# Patient Record
Sex: Male | Born: 1940 | Race: White | Hispanic: No | Marital: Married | Smoking: Former smoker
Health system: Southern US, Community
[De-identification: ages and names within clinical notes are randomized; demographics above are authoritative.]

## PROBLEM LIST (undated history)

## (undated) DIAGNOSIS — R351 Nocturia: Secondary | ICD-10-CM

## (undated) DIAGNOSIS — Z951 Presence of aortocoronary bypass graft: Secondary | ICD-10-CM

## (undated) DIAGNOSIS — H269 Unspecified cataract: Secondary | ICD-10-CM

## (undated) DIAGNOSIS — J189 Pneumonia, unspecified organism: Secondary | ICD-10-CM

## (undated) DIAGNOSIS — N400 Enlarged prostate without lower urinary tract symptoms: Secondary | ICD-10-CM

## (undated) DIAGNOSIS — N529 Male erectile dysfunction, unspecified: Secondary | ICD-10-CM

## (undated) DIAGNOSIS — I679 Cerebrovascular disease, unspecified: Secondary | ICD-10-CM

## (undated) DIAGNOSIS — A4151 Sepsis due to Escherichia coli [E. coli]: Secondary | ICD-10-CM

## (undated) DIAGNOSIS — E291 Testicular hypofunction: Secondary | ICD-10-CM

## (undated) DIAGNOSIS — Z79899 Other long term (current) drug therapy: Secondary | ICD-10-CM

## (undated) DIAGNOSIS — I2510 Atherosclerotic heart disease of native coronary artery without angina pectoris: Secondary | ICD-10-CM

## (undated) DIAGNOSIS — M1990 Unspecified osteoarthritis, unspecified site: Secondary | ICD-10-CM

## (undated) DIAGNOSIS — K8020 Calculus of gallbladder without cholecystitis without obstruction: Secondary | ICD-10-CM

## (undated) DIAGNOSIS — E785 Hyperlipidemia, unspecified: Secondary | ICD-10-CM

## (undated) DIAGNOSIS — K635 Polyp of colon: Secondary | ICD-10-CM

## (undated) DIAGNOSIS — Z8489 Family history of other specified conditions: Secondary | ICD-10-CM

## (undated) DIAGNOSIS — I10 Essential (primary) hypertension: Secondary | ICD-10-CM

## (undated) DIAGNOSIS — IMO0001 Reserved for inherently not codable concepts without codable children: Secondary | ICD-10-CM

## (undated) DIAGNOSIS — N390 Urinary tract infection, site not specified: Secondary | ICD-10-CM

## (undated) DIAGNOSIS — H9193 Unspecified hearing loss, bilateral: Secondary | ICD-10-CM

## (undated) DIAGNOSIS — I639 Cerebral infarction, unspecified: Secondary | ICD-10-CM

## (undated) DIAGNOSIS — C4491 Basal cell carcinoma of skin, unspecified: Secondary | ICD-10-CM

## (undated) DIAGNOSIS — K759 Inflammatory liver disease, unspecified: Secondary | ICD-10-CM

## (undated) DIAGNOSIS — I4891 Unspecified atrial fibrillation: Secondary | ICD-10-CM

## (undated) DIAGNOSIS — N419 Inflammatory disease of prostate, unspecified: Secondary | ICD-10-CM

## (undated) DIAGNOSIS — K5790 Diverticulosis of intestine, part unspecified, without perforation or abscess without bleeding: Secondary | ICD-10-CM

## (undated) DIAGNOSIS — K219 Gastro-esophageal reflux disease without esophagitis: Secondary | ICD-10-CM

## (undated) DIAGNOSIS — G4730 Sleep apnea, unspecified: Secondary | ICD-10-CM

## (undated) DIAGNOSIS — T7840XA Allergy, unspecified, initial encounter: Secondary | ICD-10-CM

## (undated) DIAGNOSIS — Z5181 Encounter for therapeutic drug level monitoring: Secondary | ICD-10-CM

## (undated) DIAGNOSIS — I219 Acute myocardial infarction, unspecified: Secondary | ICD-10-CM

## (undated) DIAGNOSIS — I739 Peripheral vascular disease, unspecified: Secondary | ICD-10-CM

## (undated) DIAGNOSIS — D126 Benign neoplasm of colon, unspecified: Secondary | ICD-10-CM

## (undated) DIAGNOSIS — E669 Obesity, unspecified: Secondary | ICD-10-CM

## (undated) DIAGNOSIS — C4492 Squamous cell carcinoma of skin, unspecified: Secondary | ICD-10-CM

## (undated) HISTORY — DX: Essential (primary) hypertension: I10

## (undated) HISTORY — DX: Male erectile dysfunction, unspecified: N52.9

## (undated) HISTORY — DX: Cerebral infarction, unspecified: I63.9

## (undated) HISTORY — DX: Acute myocardial infarction, unspecified: I21.9

## (undated) HISTORY — DX: Unspecified osteoarthritis, unspecified site: M19.90

## (undated) HISTORY — PX: TOTAL KNEE ARTHROPLASTY: SHX125

## (undated) HISTORY — DX: Atherosclerotic heart disease of native coronary artery without angina pectoris: I25.10

## (undated) HISTORY — DX: Calculus of gallbladder without cholecystitis without obstruction: K80.20

## (undated) HISTORY — DX: Allergy, unspecified, initial encounter: T78.40XA

## (undated) HISTORY — PX: CORONARY ANGIOPLASTY WITH STENT PLACEMENT: SHX49

## (undated) HISTORY — PX: TONSILLECTOMY: SUR1361

## (undated) HISTORY — PX: JOINT REPLACEMENT: SHX530

## (undated) HISTORY — DX: Hyperlipidemia, unspecified: E78.5

## (undated) HISTORY — PX: VASECTOMY: SHX75

## (undated) HISTORY — DX: Diverticulosis of intestine, part unspecified, without perforation or abscess without bleeding: K57.90

## (undated) HISTORY — DX: Polyp of colon: K63.5

## (undated) HISTORY — DX: Urinary tract infection, site not specified: N39.0

## (undated) HISTORY — DX: Testicular hypofunction: E29.1

## (undated) HISTORY — DX: Sleep apnea, unspecified: G47.30

## (undated) HISTORY — DX: Obesity, unspecified: E66.9

## (undated) HISTORY — DX: Inflammatory liver disease, unspecified: K75.9

## (undated) HISTORY — DX: Benign prostatic hyperplasia without lower urinary tract symptoms: N40.0

## (undated) HISTORY — DX: Inflammatory disease of prostate, unspecified: N41.9

## (undated) HISTORY — DX: Benign neoplasm of colon, unspecified: D12.6

## (undated) HISTORY — DX: Cerebrovascular disease, unspecified: I67.9

## (undated) HISTORY — DX: Gastro-esophageal reflux disease without esophagitis: K21.9

## (undated) HISTORY — DX: Sepsis due to Escherichia coli (e. coli): A41.51

---
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
## 1998-11-23 ENCOUNTER — Other Ambulatory Visit: Admission: RE | Admit: 1998-11-23 | Discharge: 1998-11-23 | Payer: Self-pay | Admitting: Urology

## 2004-05-17 DIAGNOSIS — K635 Polyp of colon: Secondary | ICD-10-CM

## 2004-05-17 HISTORY — DX: Polyp of colon: K63.5

## 2004-06-01 ENCOUNTER — Encounter: Admission: RE | Admit: 2004-06-01 | Discharge: 2004-06-01 | Payer: Self-pay | Admitting: Otolaryngology

## 2004-06-05 ENCOUNTER — Ambulatory Visit (HOSPITAL_COMMUNITY): Admission: RE | Admit: 2004-06-05 | Discharge: 2004-06-05 | Payer: Self-pay | Admitting: Gastroenterology

## 2004-06-05 ENCOUNTER — Encounter (INDEPENDENT_AMBULATORY_CARE_PROVIDER_SITE_OTHER): Payer: Self-pay | Admitting: *Deleted

## 2004-11-30 ENCOUNTER — Ambulatory Visit: Payer: Self-pay | Admitting: Cardiology

## 2004-12-31 ENCOUNTER — Ambulatory Visit (HOSPITAL_COMMUNITY): Admission: RE | Admit: 2004-12-31 | Discharge: 2004-12-31 | Payer: Self-pay | Admitting: Orthopedic Surgery

## 2005-01-02 ENCOUNTER — Inpatient Hospital Stay (HOSPITAL_COMMUNITY): Admission: RE | Admit: 2005-01-02 | Discharge: 2005-01-05 | Payer: Self-pay | Admitting: Orthopedic Surgery

## 2005-11-13 ENCOUNTER — Ambulatory Visit: Payer: Self-pay | Admitting: Cardiovascular Disease

## 2005-12-06 ENCOUNTER — Ambulatory Visit: Payer: Self-pay | Admitting: Cardiology

## 2006-01-01 ENCOUNTER — Inpatient Hospital Stay (HOSPITAL_COMMUNITY): Admission: RE | Admit: 2006-01-01 | Discharge: 2006-01-04 | Payer: Self-pay | Admitting: Orthopedic Surgery

## 2006-01-31 ENCOUNTER — Ambulatory Visit: Payer: Self-pay

## 2006-03-12 ENCOUNTER — Ambulatory Visit (HOSPITAL_COMMUNITY): Admission: RE | Admit: 2006-03-12 | Discharge: 2006-03-12 | Payer: Self-pay | Admitting: Orthopedic Surgery

## 2007-01-06 ENCOUNTER — Ambulatory Visit: Payer: Self-pay | Admitting: Cardiology

## 2007-01-16 ENCOUNTER — Ambulatory Visit: Payer: Self-pay

## 2008-01-13 ENCOUNTER — Ambulatory Visit: Payer: Self-pay

## 2008-01-13 ENCOUNTER — Ambulatory Visit: Payer: Self-pay | Admitting: Cardiology

## 2008-01-13 LAB — CONVERTED CEMR LAB
ALT: 35 units/L (ref 0–53)
Albumin: 3.6 g/dL (ref 3.5–5.2)
Alkaline Phosphatase: 28 units/L — ABNORMAL LOW (ref 39–117)
BUN: 10 mg/dL (ref 6–23)
Bilirubin, Direct: 0.1 mg/dL (ref 0.0–0.3)
CO2: 29 meq/L (ref 19–32)
Calcium: 9.4 mg/dL (ref 8.4–10.5)
Cholesterol: 110 mg/dL (ref 0–200)
Glucose, Bld: 102 mg/dL — ABNORMAL HIGH (ref 70–99)
HDL: 38.2 mg/dL — ABNORMAL LOW (ref >39.0)
Sodium: 140 meq/L (ref 135–145)
Total Protein: 6.3 g/dL (ref 6.0–8.3)
Triglycerides: 89 mg/dL (ref 0–149)

## 2008-05-19 ENCOUNTER — Ambulatory Visit: Payer: Self-pay | Admitting: Cardiology

## 2008-05-19 ENCOUNTER — Inpatient Hospital Stay (HOSPITAL_COMMUNITY): Admission: AD | Admit: 2008-05-19 | Discharge: 2008-05-21 | Payer: Self-pay | Admitting: Cardiology

## 2008-05-19 ENCOUNTER — Encounter: Payer: Self-pay | Admitting: Emergency Medicine

## 2008-05-20 ENCOUNTER — Encounter (INDEPENDENT_AMBULATORY_CARE_PROVIDER_SITE_OTHER): Payer: Self-pay | Admitting: Nephrology

## 2008-05-30 ENCOUNTER — Ambulatory Visit: Payer: Self-pay | Admitting: Cardiology

## 2008-06-02 ENCOUNTER — Encounter (HOSPITAL_COMMUNITY): Admission: RE | Admit: 2008-06-02 | Discharge: 2008-08-31 | Payer: Self-pay | Admitting: Cardiology

## 2008-06-13 ENCOUNTER — Ambulatory Visit: Payer: Self-pay | Admitting: Cardiovascular Disease

## 2008-06-13 ENCOUNTER — Ambulatory Visit: Payer: Self-pay

## 2008-07-07 ENCOUNTER — Ambulatory Visit: Payer: Self-pay | Admitting: Cardiovascular Disease

## 2008-08-30 ENCOUNTER — Ambulatory Visit: Payer: Self-pay | Admitting: Cardiology

## 2008-08-30 LAB — CONVERTED CEMR LAB
Albumin: 3.7 g/dL (ref 3.5–5.2)
BUN: 13 mg/dL (ref 6–23)
Calcium: 9.2 mg/dL (ref 8.4–10.5)
Creatinine, Ser: 1.1 mg/dL (ref 0.4–1.5)
Eosinophils Relative: 3.2 % (ref 0.0–5.0)
GFR calc Af Amer: 86 mL/min
Glucose, Bld: 97 mg/dL (ref 70–99)
HCT: 45 % (ref 39.0–52.0)
Hemoglobin: 15.6 g/dL (ref 13.0–17.0)
Monocytes Absolute: 0.7 10*3/uL (ref 0.1–1.0)
Monocytes Relative: 10.7 % (ref 3.0–12.0)
Neutro Abs: 3.9 10*3/uL (ref 1.4–7.7)
Total CHOL/HDL Ratio: 2.5
Total Protein: 6.2 g/dL (ref 6.0–8.3)
Triglycerides: 93 mg/dL (ref 0–149)
WBC: 6.2 10*3/uL (ref 4.5–10.5)

## 2008-09-01 ENCOUNTER — Encounter (HOSPITAL_COMMUNITY): Admission: RE | Admit: 2008-09-01 | Discharge: 2008-09-14 | Payer: Self-pay | Admitting: Cardiology

## 2008-09-20 ENCOUNTER — Ambulatory Visit: Payer: Self-pay | Admitting: Cardiology

## 2008-10-06 ENCOUNTER — Ambulatory Visit: Payer: Self-pay | Admitting: Cardiovascular Disease

## 2008-10-22 ENCOUNTER — Encounter: Payer: Self-pay | Admitting: Emergency Medicine

## 2008-10-23 ENCOUNTER — Ambulatory Visit: Payer: Self-pay | Admitting: Internal Medicine

## 2008-10-23 ENCOUNTER — Inpatient Hospital Stay (HOSPITAL_COMMUNITY): Admission: AD | Admit: 2008-10-23 | Discharge: 2008-10-26 | Payer: Self-pay | Admitting: Internal Medicine

## 2008-10-25 ENCOUNTER — Encounter (INDEPENDENT_AMBULATORY_CARE_PROVIDER_SITE_OTHER): Payer: Self-pay | Admitting: Internal Medicine

## 2008-10-25 ENCOUNTER — Ambulatory Visit: Payer: Self-pay | Admitting: Vascular Surgery

## 2009-01-12 DIAGNOSIS — K759 Inflammatory liver disease, unspecified: Secondary | ICD-10-CM | POA: Insufficient documentation

## 2009-01-12 DIAGNOSIS — Z6841 Body Mass Index (BMI) 40.0 and over, adult: Secondary | ICD-10-CM

## 2009-01-12 DIAGNOSIS — G4733 Obstructive sleep apnea (adult) (pediatric): Secondary | ICD-10-CM | POA: Insufficient documentation

## 2009-01-12 DIAGNOSIS — I498 Other specified cardiac arrhythmias: Secondary | ICD-10-CM | POA: Insufficient documentation

## 2009-01-12 DIAGNOSIS — K8020 Calculus of gallbladder without cholecystitis without obstruction: Secondary | ICD-10-CM | POA: Insufficient documentation

## 2009-01-12 DIAGNOSIS — I2510 Atherosclerotic heart disease of native coronary artery without angina pectoris: Secondary | ICD-10-CM | POA: Insufficient documentation

## 2009-01-12 DIAGNOSIS — E785 Hyperlipidemia, unspecified: Secondary | ICD-10-CM | POA: Insufficient documentation

## 2009-01-12 DIAGNOSIS — K5730 Diverticulosis of large intestine without perforation or abscess without bleeding: Secondary | ICD-10-CM | POA: Insufficient documentation

## 2009-01-12 DIAGNOSIS — I10 Essential (primary) hypertension: Secondary | ICD-10-CM | POA: Insufficient documentation

## 2009-01-12 DIAGNOSIS — M129 Arthropathy, unspecified: Secondary | ICD-10-CM | POA: Insufficient documentation

## 2009-01-12 DIAGNOSIS — Z87448 Personal history of other diseases of urinary system: Secondary | ICD-10-CM | POA: Insufficient documentation

## 2009-01-12 DIAGNOSIS — Z87898 Personal history of other specified conditions: Secondary | ICD-10-CM | POA: Insufficient documentation

## 2009-01-12 DIAGNOSIS — I219 Acute myocardial infarction, unspecified: Secondary | ICD-10-CM | POA: Insufficient documentation

## 2009-01-13 ENCOUNTER — Ambulatory Visit: Payer: Self-pay

## 2009-01-13 ENCOUNTER — Ambulatory Visit: Payer: Self-pay | Admitting: Cardiology

## 2009-01-13 DIAGNOSIS — I6529 Occlusion and stenosis of unspecified carotid artery: Secondary | ICD-10-CM | POA: Insufficient documentation

## 2009-01-27 ENCOUNTER — Encounter: Payer: Self-pay | Admitting: Cardiology

## 2009-01-27 ENCOUNTER — Ambulatory Visit: Payer: Self-pay | Admitting: Cardiovascular Disease

## 2009-04-24 ENCOUNTER — Ambulatory Visit: Payer: Self-pay | Admitting: Cardiology

## 2009-04-24 DIAGNOSIS — E7800 Pure hypercholesterolemia, unspecified: Secondary | ICD-10-CM | POA: Insufficient documentation

## 2009-04-25 LAB — CONVERTED CEMR LAB
ALT: 39 units/L (ref 0–53)
Albumin: 3.6 g/dL (ref 3.5–5.2)
HDL: 42.2 mg/dL (ref >39.00)
Total Bilirubin: 0.8 mg/dL (ref 0.3–1.2)
Total Protein: 6.5 g/dL (ref 6.0–8.3)
VLDL: 29.2 mg/dL (ref 0.0–40.0)

## 2009-05-11 ENCOUNTER — Encounter (INDEPENDENT_AMBULATORY_CARE_PROVIDER_SITE_OTHER): Payer: Self-pay | Admitting: *Deleted

## 2009-05-30 ENCOUNTER — Ambulatory Visit: Payer: Self-pay | Admitting: Cardiovascular Disease

## 2009-05-31 ENCOUNTER — Encounter: Payer: Self-pay | Admitting: Cardiology

## 2009-06-27 ENCOUNTER — Ambulatory Visit: Payer: Self-pay | Admitting: Cardiology

## 2009-06-28 LAB — CONVERTED CEMR LAB
ALT: 45 units/L (ref 0–53)
Bilirubin, Direct: 0 mg/dL (ref 0.0–0.3)
Total Bilirubin: 1 mg/dL (ref 0.3–1.2)
Total CK: 157 units/L (ref 7–232)

## 2009-07-05 ENCOUNTER — Encounter: Payer: Self-pay | Admitting: Cardiology

## 2009-07-27 ENCOUNTER — Telehealth: Payer: Self-pay | Admitting: Cardiology

## 2009-07-27 ENCOUNTER — Encounter (INDEPENDENT_AMBULATORY_CARE_PROVIDER_SITE_OTHER): Payer: Self-pay | Admitting: *Deleted

## 2009-07-28 ENCOUNTER — Encounter (INDEPENDENT_AMBULATORY_CARE_PROVIDER_SITE_OTHER): Payer: Self-pay | Admitting: *Deleted

## 2009-09-18 ENCOUNTER — Telehealth: Payer: Self-pay | Admitting: Cardiology

## 2009-11-28 ENCOUNTER — Encounter: Payer: Self-pay | Admitting: Cardiology

## 2010-01-12 ENCOUNTER — Encounter: Payer: Self-pay | Admitting: Cardiology

## 2010-01-15 ENCOUNTER — Encounter: Payer: Self-pay | Admitting: Cardiology

## 2010-01-15 ENCOUNTER — Ambulatory Visit: Payer: Self-pay

## 2010-01-19 ENCOUNTER — Ambulatory Visit: Payer: Self-pay | Admitting: Cardiology

## 2010-01-19 DIAGNOSIS — R059 Cough, unspecified: Secondary | ICD-10-CM | POA: Insufficient documentation

## 2010-01-19 DIAGNOSIS — R55 Syncope and collapse: Secondary | ICD-10-CM | POA: Insufficient documentation

## 2010-01-19 DIAGNOSIS — R05 Cough: Secondary | ICD-10-CM

## 2010-01-23 ENCOUNTER — Encounter (INDEPENDENT_AMBULATORY_CARE_PROVIDER_SITE_OTHER): Payer: Self-pay | Admitting: *Deleted

## 2010-02-09 ENCOUNTER — Ambulatory Visit: Payer: Self-pay

## 2010-02-09 ENCOUNTER — Encounter: Payer: Self-pay | Admitting: Cardiology

## 2010-02-09 ENCOUNTER — Ambulatory Visit (HOSPITAL_COMMUNITY): Admission: RE | Admit: 2010-02-09 | Discharge: 2010-02-09 | Payer: Self-pay | Admitting: Cardiology

## 2010-02-09 ENCOUNTER — Ambulatory Visit: Payer: Self-pay | Admitting: Cardiovascular Disease

## 2010-03-13 ENCOUNTER — Ambulatory Visit: Payer: Self-pay | Admitting: Cardiovascular Disease

## 2010-03-21 LAB — CONVERTED CEMR LAB
ALT: 34 units/L (ref 0–53)
Alkaline Phosphatase: 36 units/L — ABNORMAL LOW (ref 39–117)
Bilirubin, Direct: 0.1 mg/dL (ref 0.0–0.3)
Total Bilirubin: 0.3 mg/dL (ref 0.3–1.2)
Total Protein: 6.5 g/dL (ref 6.0–8.3)

## 2010-04-20 ENCOUNTER — Ambulatory Visit: Payer: Self-pay | Admitting: Cardiology

## 2010-05-08 ENCOUNTER — Encounter: Payer: Self-pay | Admitting: Cardiovascular Disease

## 2010-05-09 ENCOUNTER — Ambulatory Visit: Payer: Self-pay

## 2010-05-09 ENCOUNTER — Ambulatory Visit: Payer: Self-pay | Admitting: Cardiovascular Disease

## 2010-05-16 ENCOUNTER — Ambulatory Visit: Payer: Self-pay | Admitting: Cardiovascular Disease

## 2010-05-17 ENCOUNTER — Encounter: Payer: Self-pay | Admitting: Cardiology

## 2010-05-17 HISTORY — PX: KNEE SURGERY: SHX244

## 2010-06-16 HISTORY — PX: TRANSURETHRAL RESECTION OF PROSTATE: SHX73

## 2010-06-26 ENCOUNTER — Telehealth: Payer: Self-pay | Admitting: Cardiology

## 2010-06-28 ENCOUNTER — Ambulatory Visit (HOSPITAL_COMMUNITY): Admission: RE | Admit: 2010-06-28 | Discharge: 2010-06-29 | Payer: Self-pay | Admitting: Orthopedic Surgery

## 2010-07-23 ENCOUNTER — Ambulatory Visit (HOSPITAL_BASED_OUTPATIENT_CLINIC_OR_DEPARTMENT_OTHER): Admission: RE | Admit: 2010-07-23 | Discharge: 2010-07-24 | Payer: Self-pay | Admitting: Urology

## 2010-07-24 ENCOUNTER — Emergency Department (HOSPITAL_COMMUNITY): Admission: EM | Admit: 2010-07-24 | Discharge: 2010-07-25 | Payer: Self-pay | Admitting: Emergency Medicine

## 2010-10-14 LAB — CONVERTED CEMR LAB
Alkaline Phosphatase: 42 units/L (ref 39–117)
Basophils Absolute: 0 10*3/uL (ref 0.0–0.1)
Basophils Relative: 0.4 % (ref 0.0–3.0)
Basophils Relative: 0.4 % (ref 0.0–3.0)
Bilirubin, Direct: 0.1 mg/dL (ref 0.0–0.3)
CO2: 33 meq/L — ABNORMAL HIGH (ref 19–32)
Chloride: 99 meq/L (ref 96–112)
Eosinophils Relative: 3.2 % (ref 0.0–5.0)
HCT: 41.6 % (ref 39.0–52.0)
Hemoglobin: 13.9 g/dL (ref 13.0–17.0)
Hemoglobin: 14.6 g/dL (ref 13.0–17.0)
LDL Cholesterol: 52 mg/dL (ref 0–99)
Lymphocytes Relative: 20.6 % (ref 12.0–46.0)
Lymphs Abs: 1.2 10*3/uL (ref 0.7–4.0)
MCV: 92.4 fL (ref 78.0–100.0)
Monocytes Absolute: 0.5 10*3/uL (ref 0.1–1.0)
Monocytes Relative: 7.1 % (ref 3.0–12.0)
Monocytes Relative: 9.8 % (ref 3.0–12.0)
Neutro Abs: 3.8 10*3/uL (ref 1.4–7.7)
Potassium: 4.3 meq/L (ref 3.5–5.1)
RBC: 4.5 M/uL (ref 4.22–5.81)
RBC: 4.54 M/uL (ref 4.22–5.81)
RDW: 13.5 % (ref 11.5–14.6)
Sodium: 139 meq/L (ref 135–145)
Total CHOL/HDL Ratio: 3
Total Protein: 6.5 g/dL (ref 6.0–8.3)
VLDL: 38.6 mg/dL (ref 0.0–40.0)
WBC: 7.5 10*3/uL (ref 4.5–10.5)

## 2010-10-18 NOTE — Miscellaneous (Signed)
Summary: Orders Update  Clinical Lists Changes  Orders: Added new Test order of Carotid Duplex (Carotid Duplex) - Signed 

## 2010-10-18 NOTE — Progress Notes (Signed)
Summary: pt needs to stop blood thinners for surgery  Phone Note From Other Clinic Call back at [phone] ext [phone]   Caller: Nurse Pam with Dr. Patsi Sears Request: Talk with Nurse, Talk with Harvin Konicek Summary of Call: pt needs to stop ASA and effient prior to TURP surgery on 11/7  fax# [phone] Initial call taken by: Omer Jack,  June 26, 2010 1:27 PM  Follow-up for Phone Call        will foward for dr Jens Som review Deliah Goody, RN  June 26, 2010 4:20 PM   Additional Follow-up for Phone Call Additional follow up Details #1::        ok to dc effient 7-10 days prior to surgery; continue ASA if possible (h/o drug eluting stent). Ferman Hamming, MD, [facility]  June 26, 2010 5:47 PM  PAM AWARE ABOVE RESPONSE FAXED TO LISTED NUMBER. Additional Follow-up by: Scherrie Bateman, LPN,  June 27, 2010 11:06 AM

## 2010-10-18 NOTE — Miscellaneous (Signed)
Summary: Orders Update  Clinical Lists Changes  Orders: Added new Test order of Arterial Duplex Lower Extremity (Arterial Duplex Low) - Signed 

## 2010-10-18 NOTE — Letter (Signed)
Summary: Custom - Lipid  [facility], Main Office  [address]   [place], [place] [address]   Phone: [phone]  Fax: [phone]     Jan 23, 2010 MRN: [id number]   Jeffrey Wiggins [address] [place], [place]  [address]   Dear [name],  We have reviewed your cholesterol results.  They are as follows:     Total Cholesterol:    132 (Desirable: less than 200)       HDL  Cholesterol:     41.30  (Desirable: greater than 40 for men and 50 for women)       LDL Cholesterol:       52  (Desirable: less than 100 for low risk and less than 70 for moderate to high risk)       Triglycerides:       193.0  (Desirable: less than 150)  Our recommendations include:These numbers look good. Continue on the same medicine. Sodium, potassium, blood count,kidney and Liver function are normal. Take care, Dr. Darel Hong.    Call our office at the number listed above if you have any questions.  Lowering your LDL cholesterol is important, but it is only one of a large number of "risk factors" that may indicate that you are at risk for heart disease, stroke or other complications of hardening of the arteries.  Other risk factors include:   A.  Cigarette Smoking* B.  High Blood Pressure* C.  Obesity* D.   Low HDL Cholesterol (see yours above)* E.   Diabetes Mellitus (higher risk if your is uncontrolled) F.  Family history of premature heart disease G.  Previous history of stroke or cardiovascular disease    *These are risk factors YOU HAVE CONTROL OVER.  For more information, visit .  There is now evidence that lowering the TOTAL CHOLESTEROL AND LDL CHOLESTEROL can reduce the risk of heart disease.  The American Heart Association recommends the following guidelines for the treatment of elevated cholesterol:  1.  If there is now current heart disease and less than two risk factors, TOTAL CHOLESTEROL should be less than 200 and LDL CHOLESTEROL should be less than 100. 2.  If  there is current heart disease or two or more risk factors, TOTAL CHOLESTEROL should be less than 200 and LDL CHOLESTEROL should be less than 70.  A diet low in cholesterol, saturated fat, and calories is the cornerstone of treatment for elevated cholesterol.  Cessation of smoking and exercise are also important in the management of elevated cholesterol and preventing vascular disease.  Studies have shown that 30 to 60 minutes of physical activity most days can help lower blood pressure, lower cholesterol, and keep your weight at a healthy level.  Drug therapy is used when cholesterol levels do not respond to therapeutic lifestyle changes (smoking cessation, diet, and exercise) and remains unacceptably high.  If medication is started, it is important to have you levels checked periodically to evaluate the need for further treatment options.  Thank you,    [employer] Team

## 2010-10-18 NOTE — Progress Notes (Signed)
Summary: refill  Phone Note Refill Request   Refills Requested: Medication #1:  HYDROCHLOROTHIAZIDE 12.5 MG TABS Take 1 tablet by mouth once a day   Supply Requested: 3 months [facility]   Method Requested: Fax to [facility] Initial call taken by: Migdalia Dk,  September 18, 2009 11:00 AM    Prescriptions: HYDROCHLOROTHIAZIDE 12.5 MG TABS (HYDROCHLOROTHIAZIDE) Take 1 tablet by mouth once a day  #90 x 3   Entered by:   Kem Parkinson   Authorized by:   Ferman Hamming, MD, [facility]   Signed by:   Kem Parkinson on 09/18/2009   Method used:   Electronically to        [facility] (mail-order)             ,          Ph: [phone]       Fax: [phone]   RxID:   [id number]

## 2010-10-18 NOTE — Assessment & Plan Note (Signed)
Summary: per check out/sf   CC:  check up.  History of Present Illness: Jeffrey Wiggins is a very pleasant gentleman, has a history of coronary artery disease.  In September 2009, the patient presented with an inferior infarct.  At that time, he was found to have total occlusion of his proximal right coronary due to stent thrombosis.  His ejection fraction was 60%.  He had a drug-eluting stent placed at right coronary artery at that time. He had a 50% circumflex as well.  Echocardiogram in May of 2011 showed normal LV function, mild LAE.  Abdominal ultrasound in 2007 showed no aneurysm. Last carotid Dopplers were performed in May of 2011 and showed a 40-59% right and a 0-39% left stenosis. Followup was recommended in 2 years. I last saw him in May of 2011. At that time he was felt to have had an episode of cough syncope. His ACE inhibitor was discontinued. Since then the patient denies any dyspnea on exertion, orthopnea, PND, pedal edema, palpitations, syncope or chest pain.   Current Medications (verified): 1)  Tra2p Study Drug .... Take One Tab Once Daily 2)  Hydrochlorothiazide 12.5 Mg Tabs (Hydrochlorothiazide) .... Take 1 Tablet By Mouth Once A Day 3)  Crestor 20 Mg Tabs (Rosuvastatin Calcium) .... Take One Tablet By Mouth Daily. 4)  Aspirin 81 Mg Tbec (Aspirin) .... Take One Tablet By Mouth Daily 5)  Uroxatral 10 Mg Xr24h-Tab (Alfuzosin Hcl) .Marland Kitchen.. 1 Tab By Mouth Once Daily 6)  Flonase 50 Mcg/act Susp (Fluticasone Propionate) .... As Directed 7)  Testim 1 % Ointment .... As Needed 8)  Multivitamins   Tabs (Multiple Vitamin) .Marland Kitchen.. 1 Tab By Mouth Once Daily 9)  Avodart 0.5 Mg Caps (Dutasteride) .Marland Kitchen.. 1 Tab By Mouth Once Daily 10)  Tylenol Arthritis Pain 650 Mg Cr-Tabs (Acetaminophen) .... As Needed 11)  Co Q 10 10 Mg Caps (Coenzyme Q10) .Marland Kitchen.. 1 Tab By Mouth Once Daily 12)  Effient 10 Mg Tabs (Prasugrel Hcl) .Marland Kitchen.. 1 Tab By Mouth Once Daily 13)  Fish Oil   Oil (Fish Oil) .Marland Kitchen.. 1 Tab By Mouth Once  Daily 14)  Acai 1000mg  .... 1 Tab By Mouth Once Daily 15)  Nitroglycerin 0.4 Mg Subl (Nitroglycerin) .... One Tablet Under Tongue Every 5 Minutes As Needed For Chest Pain---May Repeat Times Three 16)  Claritin 10 Mg Tabs (Loratadine) .Marland Kitchen.. 1 Tab By Mouth Once Daily 17)  Prosvent .Marland Kitchen.. 1 Tab By Mouth Two Times A Day 18)  Provigor .Marland Kitchen.. 1 Tab By Mouth Two Times A Day 19)  Vitamin D3 1000 Unit Caps (Cholecalciferol) .Marland Kitchen.. 1 Tab By Mouth Once Daily 20)  Magnesium Oxide .Marland Kitchen.. 1 Tab By Mouth Once Daily 21)  Cozaar 100 Mg Tabs (Losartan Potassium) .... Take One Tablet By Mouth Daily  Past History:  Past Medical History: Reviewed history from 01/19/2010 and no changes required. CAD (ICD-414.00) HYPERTENSION (ICD-401.9) PROSTATITIS, HX OF (ICD-V13.09) HYPERLIPIDEMIA (ICD-272.4) OBESITY (ICD-278.00) DIVERTICULAR DISEASE (ICD-562.10) GALLSTONES (ICD-574.20) HEPATITIS (ICD-573.3) ARTHRITIS (ICD-716.90) SLEEP APNEA (ICD-780.57) BENIGN PROSTATIC HYPERTROPHY, HX OF (ICD-V13.8) Cerebrovascular disease    Escherichia coli sepsis.   Secondary urinary tract infection.   Past Surgical History: Reviewed history from 01/12/2009 and no changes required. PTCA/stent of his right coronary artery in May of 1998.  Knee replacement.  Vasectomy.  Tonsillectomy.      Social History: Reviewed history from 01/12/2009 and no changes required.  Alcohol, 1 glass of beer per day.  Caffeine 4-5 glasses   per day.  His mother and  father both deceased (coronary artery disease).   Occupational, the patient is currently IT trainer.  Tobacco is 45-pack-year   history, but none x18 years.   Review of Systems       Problems with knee pain but no fevers or chills, productive cough, hemoptysis, dysphasia, odynophagia, melena, hematochezia, dysuria, hematuria, rash, seizure activity, orthopnea, PND, pedal edema, claudication. Remaining systems are negative.   Vital Signs:  Patient profile:   70 year old male Height:       68 inches Weight:      272 pounds BMI:     41.51 Pulse rate:   84 / minute Resp:     12 per minute BP sitting:   127 / 82  (left arm)  Vitals Entered By: Kem Parkinson (April 20, 2010 8:53 AM)  Physical Exam  General:  Well-developed well-nourished in no acute distress.  Skin is warm and dry.  HEENT is normal.  Neck is supple. No thyromegaly.  Chest is clear to auscultation with normal expansion.  Cardiovascular exam is regular rate and rhythm.  Abdominal exam nontender or distended. No masses palpated. Extremities show no edema. neuro grossly intact    Impression & Recommendations:  Problem # 1:  SYNCOPE (ICD-780.2) No further episodes. Most likely cough syncope. His updated medication list for this problem includes:    Aspirin 81 Mg Tbec (Aspirin) .Marland Kitchen... Take one tablet by mouth daily    Effient 10 Mg Tabs (Prasugrel hcl) .Marland Kitchen... 1 tab by mouth once daily    Nitroglycerin 0.4 Mg Subl (Nitroglycerin) ..... One tablet under tongue every 5 minutes as needed for chest pain---may repeat times three  Problem # 2:  PURE HYPERCHOLESTEROLEMIA (ICD-272.0) Continue statin. His updated medication list for this problem includes:    Crestor 20 Mg Tabs (Rosuvastatin calcium) .Marland Kitchen... Take one tablet by mouth daily.  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 Followup carotid Dopplers May 2013. His updated medication list for this problem includes:    Aspirin 81 Mg Tbec (Aspirin) .Marland Kitchen... Take one tablet by mouth daily    Effient 10 Mg Tabs (Prasugrel hcl) .Marland Kitchen... 1 tab by mouth once daily  Problem # 4:  CAD (ICD-414.00) Continue aspirin, statin. He is scheduled to have knee surgery and potentially prostate surgery. He is not having chest pain. I would like for him to continue aspirin if possible and discontinue his effient 10 days prior to the procedure. If absolutely necessary we will discontinue his aspirin 10 days prior to. Resume afterwards. I do not  think ischemia evaluation is necessary preoperatively as he has had no symptoms. His updated medication list for this problem includes:    Aspirin 81 Mg Tbec (Aspirin) .Marland Kitchen... Take one tablet by mouth daily    Effient 10 Mg Tabs (Prasugrel hcl) .Marland Kitchen... 1 tab by mouth once daily    Nitroglycerin 0.4 Mg Subl (Nitroglycerin) ..... One tablet under tongue every 5 minutes as needed for chest pain---may repeat times three  Problem # 5:  HYPERTENSION (ICD-401.9) Blood pressure controlled on present medications. Will continue. His updated medication list for this problem includes:    Hydrochlorothiazide 12.5 Mg Tabs (Hydrochlorothiazide) .Marland Kitchen... Take 1 tablet by mouth once a day    Aspirin 81 Mg Tbec (Aspirin) .Marland Kitchen... Take one tablet by mouth daily    Cozaar 100 Mg Tabs (Losartan potassium) .Marland Kitchen... Take one tablet by mouth daily  Problem # 6:  SLEEP APNEA (ICD-780.57)  Problem # 7:  BENIGN PROSTATIC  HYPERTROPHY, HX OF (ICD-V13.8)  Patient Instructions: 1)  Your physician recommends that you schedule a follow-up appointment in: 1 year with Dr. Jens Som 2)  Your physician recommends that you continue on your current medications as directed. Please refer to the Current Medication list given to you today.

## 2010-10-18 NOTE — Assessment & Plan Note (Signed)
Summary: ROV   CC:  no complaints.  History of Present Illness: Jeffrey Wiggins is a very pleasant gentleman, has a history of coronary artery disease.  In September 2009, the patient presented with an inferior infarct.  At that time, he was found to have total occlusion of his proximal right coronary due to stent thrombosis.  His ejection fraction was 60%.  He had a drug-eluting stent placed at right coronary artery at that time. He had a 50% circumflex as well.  Echocardiogram in 2009 showed normal LV function, mild biatrial enlargement and mild right ventricular enlargement.  Abdominal ultrasound in 2007 showed no aneurysm. Last carotid Dopplers were performed in May of 2011 and showed a 40-59% right and a 0-39% left stenosis. Followup was recommended in 2 years. I last saw him in April 2010. Since then he has noticed some increased dyspnea on exertion relieved with rest. There is no associated chest pain. There is no orthopnea, PND or pedal edema. He has not had exertional chest pain. He does complain of a cough that is dry for the past one year. Several weeks ago he had an episode of syncope that occurred while he was choking on some food. He began coughing and then had a syncopal episode. There was no associated palpitations, chest pain or shortness of breath.  Current Medications (verified): 1)  Tra2p Study Drug .... Take One Tab Once Daily 2)  Hydrochlorothiazide 12.5 Mg Tabs (Hydrochlorothiazide) .... Take 1 Tablet By Mouth Once A Day 3)  Quinapril Hcl 20 Mg Tabs (Quinapril Hcl) .... Take 1 By Mouth Twice A Day 4)  Crestor 20 Mg Tabs (Rosuvastatin Calcium) .... Take One Tablet By Mouth Daily. 5)  Aspirin 81 Mg Tbec (Aspirin) .... Take One Tablet By Mouth Daily 6)  Uroxatral 10 Mg Xr24h-Tab (Alfuzosin Hcl) .Marland Kitchen.. 1 Tab By Mouth Once Daily 7)  Flonase 50 Mcg/act Susp (Fluticasone Propionate) .... As Directed 8)  Testim 1 % Ointment .... As Needed 9)  Multivitamins   Tabs (Multiple Vitamin) .Marland Kitchen.. 1  Tab By Mouth Once Daily 10)  Avodart 0.5 Mg Caps (Dutasteride) .Marland Kitchen.. 1 Tab By Mouth Once Daily 11)  Tylenol Arthritis Pain 650 Mg Cr-Tabs (Acetaminophen) .... As Needed 12)  Co Q 10 10 Mg Caps (Coenzyme Q10) .Marland Kitchen.. 1 Tab By Mouth Once Daily 13)  Effient 10 Mg Tabs (Prasugrel Hcl) .Marland Kitchen.. 1 Tab By Mouth Once Daily 14)  Fish Oil   Oil (Fish Oil) .Marland Kitchen.. 1 Tab By Mouth Once Daily 15)  Acai 1000mg  .... 1 Tab By Mouth Once Daily 16)  Metoprolol Succinate 25 Mg Xr24h-Tab (Metoprolol Succinate) .... 1/2 Tab By Mouth Once Daily 17)  Nitroglycerin 0.4 Mg Subl (Nitroglycerin) .... One Tablet Under Tongue Every 5 Minutes As Needed For Chest Pain---May Repeat Times Three 18)  Claritin 10 Mg Tabs (Loratadine) .Marland Kitchen.. 1 Tab By Mouth Once Daily 19)  Ra Krill Oil  Caps (Krill Oil) .Marland Kitchen.. 1 Tab By Mouth Once Daily 20)  Prosvent .Marland Kitchen.. 1 Tab By Mouth Two Times A Day 21)  Provigor .Marland Kitchen.. 1 Tab By Mouth Two Times A Day 22)  Vitamin D3 1000 Unit Caps (Cholecalciferol) .Marland Kitchen.. 1 Tab By Mouth Once Daily 23)  Magnesium Oxide .Marland Kitchen.. 1 Tab By Mouth Once Daily  Past History:  Past Medical History: CAD (ICD-414.00) HYPERTENSION (ICD-401.9) PROSTATITIS, HX OF (ICD-V13.09) HYPERLIPIDEMIA (ICD-272.4) OBESITY (ICD-278.00) DIVERTICULAR DISEASE (ICD-562.10) GALLSTONES (ICD-574.20) HEPATITIS (ICD-573.3) ARTHRITIS (ICD-716.90) SLEEP APNEA (ICD-780.57) BENIGN PROSTATIC HYPERTROPHY, HX OF (ICD-V13.8) Cerebrovascular disease  Escherichia coli sepsis.   Secondary urinary tract infection.   Past Surgical History: Reviewed history from 01/12/2009 and no changes required. PTCA/stent of his right coronary artery in May of 1998.  Knee replacement.  Vasectomy.  Tonsillectomy.      Social History: Reviewed history from 01/12/2009 and no changes required.  Alcohol, 1 glass of beer per day.  Caffeine 4-5 glasses   per day.  His mother and father both deceased (coronary artery disease).   Occupational, the patient is currently IT trainer.   Tobacco is 45-pack-year   history, but none x18 years.   Review of Systems       Dry cough but no fevers or chills,  hemoptysis, dysphasia, odynophagia, melena, hematochezia, dysuria, hematuria, rash, seizure activity, orthopnea, PND, pedal edema, claudication. Remaining systems are negative.   Vital Signs:  Patient profile:   70 year old male Height:      68 inches Weight:      276 pounds BMI:     42.12 Pulse rate:   78 / minute Resp:     12 per minute BP sitting:   127 / 79  (left arm)  Vitals Entered By: Kem Parkinson (Jan 19, 2010 8:12 AM)  Physical Exam  General:  Well-developed well-nourished in no acute distress.  Skin is warm and dry.  HEENT is normal.  Neck is supple. No thyromegaly.  Chest is clear to auscultation with normal expansion.  Cardiovascular exam is regular rate and rhythm.  Abdominal exam nontender or distended. No masses palpated. Extremities show no edema. neuro grossly intact    EKG  Procedure date:  01/19/2010  Findings:      Normal sinus rhythm at a rate of 78. No ST changes.  Impression & Recommendations:  Problem # 1:  SYNCOPE (ICD-780.2)  This was felt to be cough syncope. Schedule echocardiogram. The following medications were removed from the medication list:    Quinapril Hcl 20 Mg Tabs (Quinapril hcl) .Marland Kitchen... Take 1 by mouth twice a day    Metoprolol Succinate 25 Mg Xr24h-tab (Metoprolol succinate) .Marland Kitchen... 1/2 tab by mouth once daily His updated medication list for this problem includes:    Aspirin 81 Mg Tbec (Aspirin) .Marland Kitchen... Take one tablet by mouth daily    Effient 10 Mg Tabs (Prasugrel hcl) .Marland Kitchen... 1 tab by mouth once daily    Nitroglycerin 0.4 Mg Subl (Nitroglycerin) ..... One tablet under tongue every 5 minutes as needed for chest pain---may repeat times three  His updated medication list for this problem includes:    Quinapril Hcl 20 Mg Tabs (Quinapril hcl) .Marland Kitchen... Take 1 by mouth twice a day    Aspirin 81 Mg Tbec (Aspirin)  .Marland Kitchen... Take one tablet by mouth daily    Effient 10 Mg Tabs (Prasugrel hcl) .Marland Kitchen... 1 tab by mouth once daily    Metoprolol Succinate 25 Mg Xr24h-tab (Metoprolol succinate) .Marland Kitchen... 1/2 tab by mouth once daily    Nitroglycerin 0.4 Mg Subl (Nitroglycerin) ..... One tablet under tongue every 5 minutes as needed for chest pain---may repeat times three  Problem # 2:  COUGH (ICD-786.2)  Most likely secondary to ACE inhibitor. We'll discontinue. If cough persists he will see his primary care physician for further evaluation including possible chest x-ray. The following medications were removed from the medication list:    Quinapril Hcl 20 Mg Tabs (Quinapril hcl) .Marland Kitchen... Take 1 by mouth twice a day    Metoprolol Succinate 25 Mg Xr24h-tab (Metoprolol succinate) .Marland Kitchen... 1/2 tab by  mouth once daily His updated medication list for this problem includes:    Aspirin 81 Mg Tbec (Aspirin) .Marland Kitchen... Take one tablet by mouth daily    Effient 10 Mg Tabs (Prasugrel hcl) .Marland Kitchen... 1 tab by mouth once daily    Nitroglycerin 0.4 Mg Subl (Nitroglycerin) ..... One tablet under tongue every 5 minutes as needed for chest pain---may repeat times three  Orders: Echocardiogram (Echo)  Problem # 3:  PURE HYPERCHOLESTEROLEMIA (ICD-272.0)  Continue statin. Check lipids and liver. His updated medication list for this problem includes:    Crestor 20 Mg Tabs (Rosuvastatin calcium) .Marland Kitchen... Take one tablet by mouth daily.  His updated medication list for this problem includes:    Crestor 20 Mg Tabs (Rosuvastatin calcium) .Marland Kitchen... Take one tablet by mouth daily.  Orders: TLB-BMP (Basic Metabolic Panel-BMET) (80048-METABOL) TLB-BNP (B-Natriuretic Peptide) (83880-BNPR) TLB-CBC Platelet - w/Differential (85025-CBCD) TLB-Lipid Panel (80061-LIPID) TLB-Hepatic/Liver Function Pnl (80076-HEPATIC) TLB-GGT (Gamma GT) (82977-GGT)  Problem # 4:  CAROTID ARTERY STENOSIS (ICD-433.10)  Followup carotid Dopplers May of 2013. Continue aspirin and  statin. His updated medication list for this problem includes:    Aspirin 81 Mg Tbec (Aspirin) .Marland Kitchen... Take one tablet by mouth daily    Effient 10 Mg Tabs (Prasugrel hcl) .Marland Kitchen... 1 tab by mouth once daily  His updated medication list for this problem includes:    Aspirin 81 Mg Tbec (Aspirin) .Marland Kitchen... Take one tablet by mouth daily    Effient 10 Mg Tabs (Prasugrel hcl) .Marland Kitchen... 1 tab by mouth once daily  Orders: TLB-BMP (Basic Metabolic Panel-BMET) (80048-METABOL) TLB-BNP (B-Natriuretic Peptide) (83880-BNPR) TLB-CBC Platelet - w/Differential (85025-CBCD) TLB-Lipid Panel (80061-LIPID) TLB-Hepatic/Liver Function Pnl (80076-HEPATIC) TLB-GGT (Gamma GT) (82977-GGT)  Problem # 5:  CAD (ICD-414.00)  Continue present medications including statin. The following medications were removed from the medication list:    Quinapril Hcl 20 Mg Tabs (Quinapril hcl) .Marland Kitchen... Take 1 by mouth twice a day    Metoprolol Succinate 25 Mg Xr24h-tab (Metoprolol succinate) .Marland Kitchen... 1/2 tab by mouth once daily His updated medication list for this problem includes:    Aspirin 81 Mg Tbec (Aspirin) .Marland Kitchen... Take one tablet by mouth daily    Effient 10 Mg Tabs (Prasugrel hcl) .Marland Kitchen... 1 tab by mouth once daily    Nitroglycerin 0.4 Mg Subl (Nitroglycerin) ..... One tablet under tongue every 5 minutes as needed for chest pain---may repeat times three  His updated medication list for this problem includes:    Quinapril Hcl 20 Mg Tabs (Quinapril hcl) .Marland Kitchen... Take 1 by mouth twice a day    Aspirin 81 Mg Tbec (Aspirin) .Marland Kitchen... Take one tablet by mouth daily    Effient 10 Mg Tabs (Prasugrel hcl) .Marland Kitchen... 1 tab by mouth once daily    Metoprolol Succinate 25 Mg Xr24h-tab (Metoprolol succinate) .Marland Kitchen... 1/2 tab by mouth once daily    Nitroglycerin 0.4 Mg Subl (Nitroglycerin) ..... One tablet under tongue every 5 minutes as needed for chest pain---may repeat times three  Orders: TLB-BMP (Basic Metabolic Panel-BMET) (80048-METABOL) TLB-BNP (B-Natriuretic  Peptide) (83880-BNPR) TLB-CBC Platelet - w/Differential (85025-CBCD) TLB-Lipid Panel (80061-LIPID) TLB-Hepatic/Liver Function Pnl (80076-HEPATIC) TLB-GGT (Gamma GT) (82977-GGT)  Problem # 6:  HYPERTENSION (ICD-401.9)  Patient does complain of cough. Discontinue ACE inhibitor and begin Cozaar 100 mg p.o. daily. Patient also complaining of impotence. Discontinue beta blocker. Check bmet. The following medications were removed from the medication list:    Quinapril Hcl 20 Mg Tabs (Quinapril hcl) .Marland Kitchen... Take 1 by mouth twice a day    Metoprolol Succinate  25 Mg Xr24h-tab (Metoprolol succinate) .Marland Kitchen... 1/2 tab by mouth once daily His updated medication list for this problem includes:    Hydrochlorothiazide 12.5 Mg Tabs (Hydrochlorothiazide) .Marland Kitchen... Take 1 tablet by mouth once a day    Aspirin 81 Mg Tbec (Aspirin) .Marland Kitchen... Take one tablet by mouth daily    Cozaar 100 Mg Tabs (Losartan potassium) .Marland Kitchen... Take one tablet by mouth daily  Orders: TLB-BMP (Basic Metabolic Panel-BMET) (80048-METABOL) TLB-BNP (B-Natriuretic Peptide) (83880-BNPR) TLB-CBC Platelet - w/Differential (85025-CBCD) TLB-Lipid Panel (80061-LIPID) TLB-Hepatic/Liver Function Pnl (80076-HEPATIC) TLB-GGT (Gamma GT) (82977-GGT) Echocardiogram (Echo)  Problem # 7:  SLEEP APNEA (ICD-780.57)  Patient Instructions: 1)  Your physician recommends that you schedule a follow-up appointment in: 3months with Dr Jens Som 2)  Your physician has recommended you make the following change in your medication: stop Quinipril, stop Toprol, Start Cozaar 100mg  daily 3)  Your physician has requested that you have an echocardiogram.  Echocardiography is a painless test that uses sound waves to create images of your heart. It provides your doctor with information about the size and shape of your heart and how well your heart's chambers and valves are working.  This procedure takes approximately one hour. There are no restrictions for this procedure. 4)  Your  physician recommends that you return for lab work today Prescriptions: COZAAR 100 MG TABS (LOSARTAN POTASSIUM) Take one tablet by mouth daily  #30 x 11   Entered by:   Dennis Bast, RN, BSN   Authorized by:   Ferman Hamming, MD, [facility]   Signed by:   Dennis Bast, RN, BSN on 01/19/2010   Method used:   Electronically to        [facility]  [place] [address] (retail)       [address]       [place], [place]  [address]       Ph: [phone] or [phone]       Fax: [phone]   RxID:   [id number]

## 2010-11-01 ENCOUNTER — Ambulatory Visit (INDEPENDENT_AMBULATORY_CARE_PROVIDER_SITE_OTHER): Payer: 59 | Admitting: Family Medicine

## 2010-11-01 DIAGNOSIS — Z0000 Encounter for general adult medical examination without abnormal findings: Secondary | ICD-10-CM

## 2010-11-01 DIAGNOSIS — H16139 Photokeratitis, unspecified eye: Secondary | ICD-10-CM

## 2010-11-01 DIAGNOSIS — R7301 Impaired fasting glucose: Secondary | ICD-10-CM

## 2010-11-01 DIAGNOSIS — Z79899 Other long term (current) drug therapy: Secondary | ICD-10-CM

## 2010-11-27 LAB — POCT I-STAT 4, (NA,K, GLUC, HGB,HCT)
HCT: 44 % (ref 39.0–52.0)
Hemoglobin: 15 g/dL (ref 13.0–17.0)
Potassium: 3.9 mEq/L (ref 3.5–5.1)

## 2010-11-28 LAB — COMPREHENSIVE METABOLIC PANEL
ALT: 28 U/L (ref 0–53)
AST: 24 U/L (ref 0–37)
Albumin: 3.9 g/dL (ref 3.5–5.2)
Alkaline Phosphatase: 41 U/L (ref 39–117)
Calcium: 9.9 mg/dL (ref 8.4–10.5)
GFR calc Af Amer: >60 mL/min (ref >60)
Glucose, Bld: 65 mg/dL — ABNORMAL LOW (ref 70–99)
Potassium: 4.3 mEq/L (ref 3.5–5.1)
Sodium: 139 mEq/L (ref 135–145)
Total Protein: 6.9 g/dL (ref 6.0–8.3)

## 2010-11-28 LAB — URINALYSIS, ROUTINE W REFLEX MICROSCOPIC
Glucose, UA: NEGATIVE mg/dL
Hgb urine dipstick: NEGATIVE
Protein, ur: NEGATIVE mg/dL
Specific Gravity, Urine: 1.012 (ref 1.005–1.030)
pH: 7 (ref 5.0–8.0)

## 2010-11-28 LAB — CBC
HCT: 43.7 % (ref 39.0–52.0)
MCHC: 33.4 g/dL (ref 30.0–36.0)
RDW: 15.1 % (ref 11.5–15.5)
WBC: 8.1 10*3/uL (ref 4.0–10.5)

## 2010-11-28 LAB — SURGICAL PCR SCREEN: MRSA, PCR: NEGATIVE

## 2011-01-01 LAB — CBC
HCT: 44.8 % (ref 39.0–52.0)
HCT: 38.2 % — ABNORMAL LOW (ref 39.0–52.0)
HCT: 39.4 % (ref 39.0–52.0)
Hemoglobin: 14.8 g/dL (ref 13.0–17.0)
Hemoglobin: 13.1 g/dL (ref 13.0–17.0)
MCHC: 34.2 g/dL (ref 30.0–36.0)
MCHC: 34.6 g/dL (ref 30.0–36.0)
MCV: 90.8 fL (ref 78.0–100.0)
MCV: 91.4 fL (ref 78.0–100.0)
Platelets: 131 10*3/uL — ABNORMAL LOW (ref 150–400)
Platelets: 149 10*3/uL — ABNORMAL LOW (ref 150–400)
RBC: 4.18 MIL/uL — ABNORMAL LOW (ref 4.22–5.81)
RBC: 4.22 MIL/uL (ref 4.22–5.81)
WBC: 23.5 10*3/uL — ABNORMAL HIGH (ref 4.0–10.5)
WBC: 13.9 10*3/uL — ABNORMAL HIGH (ref 4.0–10.5)

## 2011-01-01 LAB — CULTURE, BLOOD (ROUTINE X 2)

## 2011-01-01 LAB — URINALYSIS, ROUTINE W REFLEX MICROSCOPIC
Bilirubin Urine: NEGATIVE
Nitrite: POSITIVE — AB
Protein, ur: 100 mg/dL — AB
pH: 7 (ref 5.0–8.0)

## 2011-01-01 LAB — DIFFERENTIAL
Basophils Absolute: 0 10*3/uL (ref 0.0–0.1)
Basophils Relative: 0 % (ref 0–1)
Basophils Relative: 0 % (ref 0–1)
Basophils Relative: 0 % (ref 0–1)
Eosinophils Absolute: 0 10*3/uL (ref 0.0–0.7)
Eosinophils Absolute: 0.2 10*3/uL (ref 0.0–0.7)
Eosinophils Relative: 0 % (ref 0–5)
Eosinophils Relative: 3 % (ref 0–5)
Lymphocytes Relative: 5 % — ABNORMAL LOW (ref 12–46)
Lymphs Abs: 0.7 10*3/uL (ref 0.7–4.0)
Monocytes Absolute: 1.1 10*3/uL — ABNORMAL HIGH (ref 0.1–1.0)
Monocytes Absolute: 0.6 10*3/uL (ref 0.1–1.0)
Monocytes Relative: 11 % (ref 3–12)
Monocytes Relative: 6 % (ref 3–12)
Neutro Abs: 21.8 10*3/uL — ABNORMAL HIGH (ref 1.7–7.7)
Neutrophils Relative %: 93 % — ABNORMAL HIGH (ref 43–77)
Neutrophils Relative %: 75 % (ref 43–77)
Neutrophils Relative %: 90 % — ABNORMAL HIGH (ref 43–77)

## 2011-01-01 LAB — COMPREHENSIVE METABOLIC PANEL
Albumin: 3.4 g/dL — ABNORMAL LOW (ref 3.5–5.2)
Alkaline Phosphatase: 25 U/L — ABNORMAL LOW (ref 39–117)
BUN: 16 mg/dL (ref 6–23)
CO2: 26 mEq/L (ref 19–32)
Chloride: 105 mEq/L (ref 96–112)
GFR calc non Af Amer: >60 mL/min (ref >60)
Glucose, Bld: 133 mg/dL — ABNORMAL HIGH (ref 70–99)
Potassium: 4.2 mEq/L (ref 3.5–5.1)
Total Bilirubin: 1.3 mg/dL — ABNORMAL HIGH (ref 0.3–1.2)

## 2011-01-01 LAB — POCT I-STAT, CHEM 8
Creatinine, Ser: 1.2 mg/dL (ref 0.4–1.5)
Glucose, Bld: 102 mg/dL — ABNORMAL HIGH (ref 70–99)
HCT: 51 % (ref 39.0–52.0)
Hemoglobin: 17.3 g/dL — ABNORMAL HIGH (ref 13.0–17.0)
Sodium: 140 mEq/L (ref 135–145)
TCO2: 28 mmol/L (ref 0–100)

## 2011-01-01 LAB — BASIC METABOLIC PANEL
CO2: 27 mEq/L (ref 19–32)
Chloride: 104 mEq/L (ref 96–112)
GFR calc Af Amer: >60 mL/min (ref >60)
Sodium: 137 mEq/L (ref 135–145)

## 2011-01-01 LAB — URINE MICROSCOPIC-ADD ON

## 2011-01-01 LAB — URINE CULTURE: Colony Count: >=100000

## 2011-01-01 LAB — LACTIC ACID, PLASMA: Lactic Acid, Venous: 2.6 mmol/L — ABNORMAL HIGH (ref 0.5–2.2)

## 2011-01-04 ENCOUNTER — Other Ambulatory Visit: Payer: Self-pay | Admitting: Cardiology

## 2011-01-16 ENCOUNTER — Other Ambulatory Visit: Payer: Self-pay | Admitting: Cardiology

## 2011-01-29 NOTE — Cardiovascular Report (Signed)
NAME:  Jeffrey Wiggins, Jeffrey Wiggins NO.:  [id number]   MEDICAL RECORD NO.:  [id number]          PATIENT TYPE:  OIB   LOCATION:  2913                         FACILITY:  [facility]   PHYSICIAN:  Everardo Beals. Juanda Chance, MD, FACCDATE OF BIRTH:  1941/02/12   DATE OF PROCEDURE:  05/19/2008  DATE OF DISCHARGE:                            CARDIAC CATHETERIZATION   CLINICAL HISTORY:  Mr. Aylward is a 70 years old, and had a out of  hospital diaphragmatic wall infarction in 1998 and subsequently  underwent stenting of the right coronary with a multi-length stent which  was a 3.5 x 25 mm size.  This morning at 10:30, developed the onset of  chest pain, went to [facility], where his ECG showed an  acute inferior infarction.  I was asked to see him to help and  evaluation of ECG and we thought it was indeed a STEMI and a code STEMI  was called, and he was transferred to [facility] by a care link.   PROCEDURE:  The procedure was performed with the right femoral arterial  sheath and 6-French preformed coronary catheters.  A front wall arterial  puncture was performed and Omnipaque contrast was used.  We first took  pictures of the left coronary artery.  We then went in with a JR-4 6-  [place] guiding catheter with side holes.  We then documented total  occlusion of the proximal right coronary at the site of the previously  placed stent.  The patient previously was given 600 mg of clopidogrel  and 4 chewable aspirin.  We gave additional heparin and found the ACT to  be greater than 200 seconds.  We passed a Prowater wire across the total  occlusion into the distal right coronary artery.  This reestablished  some flow.  We then passed a 1.5 x 20-mm __________ wire to the lesion  and injected a bolus of ReoPro through the balloon.  A ReoPro drip was  started at the same time.  We then dilated the lesion with a 2.0 x 20-mm  apex balloon performing 1 inflation of 8 atmospheres for 30 seconds.   We  then passed Atlantis IVUS catheter across the lesion and down beyond the  stent, did automatic pullback.  This documented there was quite a bit of  tissue ingrowth throughout most of the stent, although the proximal  portion of the stent was mostly spared.  Based on the IVUS readings, we  decided to deploy a 3.0 x 23-mm Promus stent.  We deployed this with the  distal edge of the stent outside of the previous stent and proximal edge  of the stent inside the previous stent.  We deployed this with 1  inflation of 12 atmospheres for 30 seconds.  We then postdilated 3.25 x  20-mm  Voyager performing 2 inflations up to 16 atmospheres for 30  seconds.  Final diagnostics were then informed through guiding catheter.  The left ventriculogram was performed at the end of procedure.  The  right femoral was closed at the end of the procedure.   RESULTS:  The left main coronary artery:  The left main coronary artery  was free of disease.   Left anterior descending artery:  The left anterior descending artery  gave rise to a diagonal branch and a septal perforator.  The LAD was  irregular with no significant obstruction.   The circumflex artery:  The circumflex artery gave rise to an early  large marginal branch and 2 small posterolateral branches.  There was  50% narrowing of the circumflex just after the marginal branch.   The right coronary artery:  The right coronary artery was completely  occluded within the proximal portion of the previously placed stent.   The left ventriculogram:  The left ventriculogram was performed in RAO  projection showed hypokinesis of the inferobasal and mid inferior wall.  The apex and anterolateral wall move well.  Estimated fraction was 60%.   The aortic pressure was 102/65, mean of 81, left ventricle was 102/22.  Following intracoronary ReoPro balloon dilatation and stenting of the  lesion in the proximal right coronary which was a stent thrombosis, the   stenosis improved from a 100% to 0% and the flow improved from TIMI-0 to  TIMI-3 flow.   CONCLUSION:  1. Acute diaphragmatic wall infarction with total occlusion of      proximal right coronary artery due to stent thrombosis, 50%      narrowing in the proximal to mid circumflex artery, irregularities      in the left anterior descending and inferior wall hypokinesis with      an estimated ejection fraction of 60%.  2. Successful percutaneous coronary intervention of the stent      thrombosis in the proximal right coronary artery using      intracoronary ReoPro balloon dilatation and placement of a new drug-      eluting stent with improvement in center narrowing from a 100% to      0%.   DISPOSITION:  The patient returned to __________ for further  observation.      Bruce Elvera Lennox Juanda Chance, MD, [facility]  Electronically Signed     BRB/MEDQ  D:  05/19/2008  T:  05/20/2008  Job:  [id number]   cc:   Madolyn Frieze. Jens Som, MD, [facility]

## 2011-01-29 NOTE — Assessment & Plan Note (Signed)
[facility]                            CARDIOLOGY OFFICE NOTE   Jeffrey, Wiggins                       MRN:          [id number]  DATE:05/30/2008                            DOB:          1941/08/31    [name] is a pleasant gentleman who has a history of coronary  disease.  He was recently admitted to [facility] with chest  pain and was found to have an acute inferior myocardial infarction.  He  underwent cardiac catheterization on May 19, 2004.  At that time,  he was found to have a total occlusion of the proximal right coronary  artery due to stent thrombosis.  There was also a 50% narrowing of the  proximal to mid circumflex with irregularities in the LAD.  There is an  ejection fraction of 60% with inferior wall hypokinesis.  The patient  had PCI of the right coronary artery with a drug-eluting stent.  Dr.  Juanda Chance requested that the patient be placed on prasugrel as an  outpatient as it was not available in the hospital.  Since then he has  not had chest pain, shortness breath, palpitations, or syncope.   MEDICATIONS AT PRESENT:  1. Aspirin 325 mg p.o. daily.  2. Vytorin 10/80 daily.  3. Quinapril 20 mg p.o. b.i.d.  4. Hydrochlorothiazide 12.5 mg p.o. daily.  5. Uroxatral 10 mg p.o. daily.  6. Flonase.  7. Diclofenac.  8. Testim.  9. Multivitamin.  10.Coenzyme Q.  11.Fish oil.  12.Plavix 150 mg p.o. daily.  13.Toprol 12.5 mg p.o. daily.   PHYSICAL EXAMINATION:  VITAL SIGNS:  Blood pressure of 134/84 and his  pulse is 75.  He weighs 268 pounds.  HEENT:  Normal.  NECK:  Supple.  No bruits.  CHEST:  Clear.  CARDIOVASCULAR:  Regular rate and rhythm.  ABDOMINAL:  No tenderness.  Right groin shows no hematoma or bruit.  EXTREMITIES:  No edema.   His electrocardiogram shows a sinus rhythm at a rate of 75.  There is  evidence of a prior inferior infarct with inferolateral Q-wave  inversion.   DIAGNOSES:  1. Status post  inferior myocardial infarction due to stent thrombosis      - status post repeat drug-eluting stent - [name] is doing      well.  No chest pain or shortness of breath.  Dr. Juanda Chance indicated      that he would like the patient treated with prasugrel.  I will have      decrease [name] Plavix to 75 mg p.o. daily until he      completes his present prescription.  We will then begin prasugrel      10 mg p.o. daily.  I will check a CBC 1 month later.  He will also      continue on his statin, angiotensin-converting enzyme inhibitor and      beta blocker.  2. Hypertension - his blood pressure is adequately controlled on his      present medications.  3. Hyperlipidemia - I will continue his statin.  4. Cerebrovascular  disease - he will need followup carotid Dopplers in      April 2010.  5. 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   He will continue with diet and exercise.  He does not smoke.  He will  see me back in 3 months.     Madolyn Frieze Jens Som, MD, [facility]  Electronically Signed    BSC/MedQ  DD: 05/30/2008  DT: 05/31/2008  Job #: [id number]

## 2011-01-29 NOTE — Assessment & Plan Note (Signed)
[facility]                            CARDIOLOGY OFFICE NOTE   Jeffrey Wiggins, Jeffrey Wiggins                       MRN:          [id number]  DATE:01/13/2008                            DOB:          05/15/1941    Mr. Langhans is a pleasant gentleman who I have followed in the past for  coronary disease.  A catheterization in May 1998, revealed a 100% right  coronary artery and otherwise no significant disease.  The patient had a  PCI of the right coronary artery at that time.  Note, his most recent  Myoview was performed in May 2008.  At that time, he was found to have a  prior inferior infarct, but significant ischemia was noted.  His  ejection fraction was 59%.  He also has cerebrovascular disease.  Since  I last saw him, he does have some dyspnea on exertion which he  attributes to his weight.  There is no orthopnea or PND, but there can  be pedal edema as he has had bilateral knee replacement.  Has not had  chest pain, palpitations or syncope.  He is not exercising routinely.  Note, he does not smoke.   MEDICATIONS:  1. Aspirin 325 mg p.o. daily.  2. Vytorin 10/80 day.  3. Quinapril 20 mg p.o. b.i.d.  4. HCTZ 12.5 mg p.o. daily.  5. Uroxatral.  6. Flonase.  7. Diclofenac.  8. Testim.  9. Multivitamin.  10.Coenzyme Q.  11.Fish oil.  12.Acai.   PHYSICAL EXAMINATION:  VITAL SIGNS:  Today, shows a blood pressure of  128/84 and his pulse is 79.  He weighs 270 pounds.  HEENT:  Normal.  NECK:  Supple.  I cannot appreciate bruits.  CHEST:  Clear.  CARDIOVASCULAR:  Regular rate and rhythm.  ABDOMEN:  Shows no tenderness.  EXTREMITIES:  Show no edema.   His electrocardiogram shows a sinus rhythm at a rate of 81.  There is  evidence of a prior inferior infarct and a prior anterior infarct cannot  be excluded.  He did have follow-up carotid Dopplers today, and he had a  40-59% right and a 0-39% left internal carotid artery stenosis.   DIAGNOSES:  1.  Coronary disease - Mr. Schlink is not having chest pain and his      most recent Myoview showed infarct, but no ischemia.  We will      continue with medical therapy, including his aspirin, statin and      angiotensin converting enzyme inhibitor.  Note, he has not      tolerated beta-blockers in the past secondary to bradycardia.  We      discussed the importance of diet and exercise.  He does not smoke.  2. Hypertension - his blood pressure is adequately controlled on his      present medications.  I will be check a BMET given his angiotensin      converting enzyme inhibitor and diuretic use.  3. Hyperlipidemia - he will continue on his statin.  We will check      lipids and liver and adjust his regimen  as indicated.  4. Cerebrovascular disease - he will need follow-up carotid Dopplers      in April 2010.  5. 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   We will see him back in 1 year.     Madolyn Frieze Jens Som, MD, [facility]  Electronically Signed    BSC/MedQ  DD: 01/13/2008  DT: 01/13/2008  Job #: [id number]

## 2011-01-29 NOTE — Discharge Summary (Signed)
NAME:  Jeffrey Wiggins, Jeffrey Wiggins NO.:  [id number]   MEDICAL RECORD NO.:  [id number]          PATIENT TYPE:  INP   LOCATION:  2036                         FACILITY:  [facility]   PHYSICIAN:  Hollice Espy, M.D.DATE OF BIRTH:  Jun 30, 1941   DATE OF ADMISSION:  10/23/2008  DATE OF DISCHARGE:  10/26/2008                               DISCHARGE SUMMARY   ATTENDING PHYSICIAN:  Hollice Espy, M.D.   PCP:  Lavonda Jumbo, M.D.   CONSULTANTS ON THIS CASE:  Sigmund I. Patsi Sears, M.D. of urology.   DISCHARGE DIAGNOSES:  1. Escherichia coli sepsis.  2. Secondary urinary tract infection.  3. Urinary retention.  4. Secondary benign prostatic hypertrophy.  5. History of hypertension.  6. History of coronary artery disease.  7. Nausea, vomiting.   DISCHARGE MEDICATIONS:  New medicines:  Cipro 500 p.o. b.i.d. x3 weeks.  The patient resumed all of his previous medications.  These are as  follows:  Resveratrol 500 mg p.o. daily, Omega 3 p.o. t.i.d., Coenzyme-Q  p.o. b.i.d., Acal 2000 p.o. daily, Ultraman multivitamin p.o. daily,  nitroglycerin sublingual p.r.n. for chest pain, Zyrtec 5 p.o. daily,  Flonase 2 sprays daily, Vytorin 10/80 p.o. daily, Uroxatral 10 p.o.  daily, metoprolol extended release 12.5 p.o. daily, testosterone gel 1%  five grams daily, quinapril 20 b.i.d.  The patient is on a study drug,  Tracer, p.o. daily.  HCTZ 12.5 p.o. daily, aspirin 81 mg 2 tablets p.o.  daily, and Prasugrel 10 mg p.o. daily.   HOSPITAL COURSE:  The patient is a 70 year old white male with a past  medical history as above, including urinary retention, who presented  with inability to pass urine, weakness, and he was found to be  hypotensive, with a temperature in the emergency room noted at 104.  The  patient was suspected to have early signs of urosepsis.  He was placed  on the step-down unit, IV fluid, and broad-spectrum antibiotics.  Over  the next several days, his urine grew out  positive for E. coli  pansensitive to including Cipro.  His blood cultures came positive in  the early morning of October 26, 2008 also for gram-negative rods  consistent with E. coli.  The patient's white count initially was 24 on  admission.  After first giving antibiotics, it came down to 13.5 and by  hospital day 3, was down to 10.5.  Today, on the day of discharge, it is  down to 6.9.  The only persistent issue was a persistent fever.  Initially his fevers were as high as 104.  He still had some low-grade  temps as of October 26, 2008, with a 5 a.m. temperature of 100.4.  However, other sources; he had no signs of cellulitis, no signs of any  constipation, and chest x-ray was clear.  It was suspected likely he was  having some residual urinary retention.  Dr. Patsi Sears was consulted to  see the patient and recommended putting the patient on a voiding trial,  reducing his Foley, and repeat bladder scans.  The patient appeared to  be voiding well but  still having some residual urine, and the plan will  be as per Dr. Patsi Sears.  He will continue antibiotics x3 weeks, and he  will be discharged with followup with a cystoscopy in his office.  In  the interim, he recommended no discharge for fever x24 hours.  The  patient has had some low-grade temps but his last temperature spike  above 101 has been for greater than 36 hours.  The patient's overall  disposition is improved.   ACTIVITY:  Slowly increased.   DISCHARGE DIET:  A heart-healthy diet.   He will follow up with Dr. Joselyn Arrow, his PCP, in 2 weeks.  Follow up  with Dr. Patsi Sears in the next 7 days.      Hollice Espy, M.D.  Electronically Signed     SKK/MEDQ  D:  10/26/2008  T:  10/26/2008  Job:  [id number]   cc:   Vonzell Schlatter. Patsi Sears, M.D.  Lavonda Jumbo, M.D.

## 2011-01-29 NOTE — H&P (Signed)
NAME:  Jeffrey Wiggins, Jeffrey Wiggins NO.:  [id number]   MEDICAL RECORD NO.:  [id number]          PATIENT TYPE:  INP   LOCATION:                               FACILITY:  [facility]   PHYSICIAN:  Jeffrey Furlong, MD      DATE OF BIRTH:  01/11/1941   DATE OF ADMISSION:  10/23/2008  DATE OF DISCHARGE:                              HISTORY & PHYSICAL   PRIMARY CARE PHYSICIAN:  Jeffrey Wiggins, M.D.   CHIEF COMPLAINT:  Inability to pass urine.   HISTORY OF PRESENT ILLNESS:  Jeffrey Wiggins is a 70 year old Caucasian male  who presented to [facility] ER tonight after having a few hours of not  passing urine completely.  He mentions that every time he goes to the  bathroom he can have a few drops of urine but he could not pass the  urine completely.  He started hurting in his suprapubic area also at the  same time and feeling that his bladder was distended.  He also had  dysuria while passing urine.  He started having fever after arriving to  the ER.  His fever was up to 104 F.  Due to his urinary retention a  Foley catheter was tried to be placed when he arrived and it was  successful after two unsuccessful catheter attempts.  The patient also  became hypotensive in the ER.  He received fluid resuscitation,  antibiotics and CT scan of the abdomen which ruled out any complicated  abdominal issues.  Digital rectal exam was done by ER physician which  shows prostatic tenderness with enlarged prostate.  The patient denies  any history of prostatitis, prostate abscess in the past.  He denies any  history of UTI or nephrolithiasis in the past.   PAST MEDICAL HISTORY:  Hypertension, coronary artery disease status post  MI with cardiac stents, on blood thinner prasugrel (effient) at this  time, history of knee replacement, obesity.   FAMILY HISTORY:  Nothing remarkable.   SOCIAL HISTORY:  No alcohol, drug, tobacco use.  Lives with his wife.   ALLERGIES:  None.   HOME MEDICATIONS:  1. Include  Accupril 20 mg twice daily.  2. Vytorin 10/80 mg one tablet daily.  3. Hydrochlorothiazide.  4. Aspirin enteric coated.  5. Uroxatral.   REVIEW OF SYSTEMS:  Positive as per HPI.  Otherwise negative review of  systems done for 14 system.   PHYSICAL EXAMINATION:  VITAL SIGNS:  Blood pressure 133/56, pulse 105,  respirations 20, temperature 102.9.  GENERAL:  Alert and oriented x3, lying in bed without any acute  distress.  CARDIOVASCULAR:  S1, S2 regular.  No murmur, no gallop.  LUNGS:  Clear to auscultation bilaterally.  No wheezing or crackles.  ABDOMEN:  Mild tenderness over the suprapubic area.  Otherwise,  unremarkable exam with no distention or organomegaly.  EXTREMITIES:  No clubbing, cyanosis or edema.  Pulses palpable in all  four extremities.  HEAD:  Normocephalic, nontraumatic.  EYES:  Pupils reactive to light and accommodation.  Extraocular muscles  intact.  Oral cavity moist mucous membranes.  No thrush noted.  NECK:  No thyromegaly or JVD.  SKIN:  No rash or bruits.  NEUROLOGICAL:  Exam shows intact cranial nerves, muscular strength,  sensation and reflexes.   LABS:  Shows UA with large amount of WBC, RBC, bacteria in the urine  along with a positive leukocyte esterase and nitrite.  CT abdomen and  pelvis shows cholelithiasis, colonic diverticulosis, significant  prostatic enlargement with asymmetry to left posterolateral aspect,  other labs pending.   ASSESSMENT AND PLAN:  Prostatic tenderness, prostatic enlargement  associated with dysuria, fever, abdominal pain, hypotension are  suggestive of urinary tract infection and prostatitis with prostatic  enlargement.  History of benign prostatic hypertrophy  hypertension  coronary artery disease status post stents  history of knee replacement.   Will admit the patient on telemetry bed under [facility] team with a  diagnosis of prostatitis and urinary tract infection.  The patient is  full code.  Will keep him n.p.o.  except meds, ice chips and water.  Will  check CBC, differential, CMP, urinalysis, urine culture, blood cultures  now.  Will start IV Zosyn for antibiotic coverage.  Will get urology,  Jeffrey Wiggins, to see this patient as this patient is followed by Dr.  Patsi Wiggins, and will start IV fluid normal saline at 75 mL per hour for  IV hydration.  Will continue Vytorin at home dose and further plan  according to the workup pending.      Jeffrey Furlong, MD  Electronically Signed     TVP/MEDQ  D:  10/23/2008  T:  10/23/2008  Job:  [id number]   cc:   Jeffrey Wiggins, M.D.  Fax: [phone]   Jeffrey Wiggins, M.D.  Fax: [phone]   Jeffrey L. Lendell Caprice, MD

## 2011-01-29 NOTE — Assessment & Plan Note (Signed)
[facility]                            CARDIOLOGY OFFICE NOTE   DOMINGOS, RIGGI                       MRN:          [id number]  DATE:08/30/2008                            DOB:          1940/11/29    Mr. Cress is a very pleasant gentleman, has a history of coronary  artery disease.  In September 2009, the patient presented with an  inferior infarct.  At that time, he was found to have total occlusion of  his proximal right coronary due to stent thrombosis.  His ejection  fraction was 60%.  He had a drug-eluting stent placed at right coronary  artery at that time.  Since then he is doing well with no dyspnea, chest  pain, palpitations, or syncope.  He occasionally has some bleeding from  his nose.   MEDICATIONS:  1. Metoprolol ER 12.5 mg p.o. daily.  2. Resveratrol 500 mg p.o. nightly.  3. Aspirin 325 mg p.o. daily.  4. Vytorin 10/80 daily.  5. Quinapril 20 mg p.o. b.i.d.  6. HCTZ 12.5 mg p.o. daily.  7. Uroxatral.  8. Flonase.  9. Diclofenac.  10.__________  11.Multivitamin.  12.Coenzyme Q.  13.Acai 1000 mg p.o. daily.  14.Fish oil.  15.Plavix has been discontinued.  16.Effient 10 mg p.o. daily and study drug.   PHYSICAL EXAMINATION:  VITAL SIGNS:  Today shows a blood pressure of  158/86 and his pulse is 65.  HEENT:  Normal.  NECK:  Supple.  CHEST:  Clear.  CARDIOVASCULAR:  Regular rate and rhythm.  ABDOMINAL:  No tenderness.  EXTREMITIES:  No edema.   His electrocardiogram shows sinus rhythm at a rate of 65.  There are  nonspecific ST changes.   DIAGNOSES:  1. Coronary artery disease - Mr. Mihelich is doing well      symptomatically.  We will continue with his aspirin, but I will      decrease his 81 mg p.o. daily given his nose bleed.  He will      continue on his Effient, Lopressor, ACE inhibitor, and statin.  He      will continue with diet and exercise.  He does not smoke.  Note,      given his Effient use, I will check a  CBC today.  2. Hypertension - His blood pressure is mildly increased today, but in      general it has been controlled based on his records.  We will      continue his present medications.  I will check a BMET and follows      potassium and renal function.  3. Hyperlipidemia - He will continue on a statin.  We will check      lipids and liver.  4. Cerebrovascular disease - He will need followup carotid Dopplers in      April 2010.  5. 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   I will see him back in 4 months.     Madolyn Frieze Jens Som, MD, [facility]  Electronically Signed  BSC/MedQ  DD: 08/30/2008  DT: 08/30/2008  Job #: [id number]

## 2011-01-29 NOTE — Discharge Summary (Signed)
NAME:  Jeffrey Wiggins, Jeffrey Wiggins NO.:  [id number]   MEDICAL RECORD NO.:  [id number]          PATIENT TYPE:  INP   LOCATION:  3708                         FACILITY:  [facility]   PHYSICIAN:  Pricilla Riffle, MD, FACCDATE OF BIRTH:  June 22, 1941   DATE OF ADMISSION:  05/19/2008  DATE OF DISCHARGE:  05/21/2008                               DISCHARGE SUMMARY   DISCHARGING DIAGNOSES:  Coronary artery disease status post myocardial  infarction noted as an inferior ST-elevated myocardial infarction status  post cardiac catheterization this admission.  On same date, the patient  underwent cardiac catheterization/intervention by Dr. Charlies Constable.  The  patient was found to have acute diaphragmatic wall infarction with total  occlusion of proximal RCA due to stent thrombosis, 50% narrowing of  proximal to mid circumflex artery with irregularities in the LAD and  inferior wall hypokinesis with EF of 60%.  The patient underwent  successful percutaneous coronary intervention of the stent thrombosis in  the proximal RCA using intracoronary ReoPro balloon dilatation and  placement of a new drug-eluting stent.  Dr. Juanda Chance requested that the  patient be started on prasugrel which is not available in local drug  stores at this time, so the patient will be discharged home on 150 mg of  Plavix daily.  To follow up outpatient for further consideration.  Procedures this admission include cardiac catheterization, PCI as stated  above.   PAST MEDICAL HISTORY:  1. Coronary artery disease status post inferior myocardial infarction      in 1998 with totally occluded right coronary artery status post      placement of stents x2.  2. Obesity.  3. Prostatitis.  4. Hypertension.  5. Hyperlipidemia.  6. Osteoarthritis.  7. Cholelithiasis asymptomatic.   HOSPITAL COURSE:  [name] is a very pleasant 70 year old Caucasian  gentleman with known history of coronary artery disease status post  previous stents  in 1998.  [name] was at [facility] on day  of admission picking up his mother who is being discharged from the  hospital when he began to experience chest discomfort.  It continued to  wax and wane.  He mentioned it to one of the nurses there.  The patient  was brought down to the emergency room for evaluation.  EKG in the  emergency room at [facility] revealed an inferior ST-elevated MI.  The  patient was transferred urgently to [facility] via Cardiac  Catheterization Lab for further intervention, underwent procedure as  stated above.  The patient tolerated the procedure without  complications.  He remained stable and the following day was transferred  to telemetry unit, evaluated by Dr. Tenny Craw, found to be afebrile,  previously low-grade temperature of 99.2, blood pressure stable 120s to  150s/60s to 80s, sinus rhythm.  Creatinine at time of discharge 1.16,  potassium 4.0, WBCs 9.9, and hematocrit 45.9.  Lipid panel showed a  total cholesterol 96, triglycerides 89, HDL 33, and LDL 45.  At time of  discharge, the patient's troponin 6.06 which was down from 8.9.  The  patient is stable, being  discharged home to follow up with Dr. Jens Som  in 2 weeks.  The patient started on Toprol-XL 12.5 mg daily, he is  instructed to take half of a 25 mg tablet, nitroglycerin p.r.n., Plavix  has been increased to 150 mg daily for now.  We will consider switching  over to prasugrel outpatient, aspirin 325, Flonase as previously  prescribed/Vytorin 10/80, hydrochlorothiazide 12.5 mg daily, Uroxatral  10 mg daily, quinapril 20 mg b.i.d., diclofenac, Tylenol p.r.n. only as  needed.  The patient has been given the post cardiac catheterization.   DISCHARGE INSTRUCTIONS:  Office will call the patient to arrange  followup appointment within the next 2 weeks.  The duration of discharge  encountered greater than 30 minutes.      Dorian Pod, ACNP      Pricilla Riffle, MD, [facility]   Electronically Signed    MB/MEDQ  D:  05/23/2008  T:  05/24/2008  Job:  [id number]

## 2011-01-29 NOTE — H&P (Signed)
NAME:  Jeffrey Wiggins, Jeffrey Wiggins NO.:  [id number]   MEDICAL RECORD NO.:  [id number]          PATIENT TYPE:  OIB   LOCATION:  2913                         FACILITY:  [facility]   PHYSICIAN:  Everardo Beals. Juanda Chance, MD, FACCDATE OF BIRTH:  15-Mar-1941   DATE OF ADMISSION:  05/19/2008  DATE OF DISCHARGE:                              HISTORY & PHYSICAL   PRIMARY. CARDIOLOGIST:  Madolyn Frieze. Jens Som, MD, [facility]   PRIMARY CARE PHYSICIAN:  Unlisted.   HISTORY OF PRESENT ILLNESS:  A 70 year old obese Caucasian male with  known history of coronary artery disease, inferior MI with stents to the  right coronary artery in 1998, secondary to total occlusion of the right  coronary artery at that time, who while waiting to pickup his mother who  was being discharged from [facility] had increasing chest  pain.  The pain actually began around 10:30 a.m. today but became more  intense and then began to have radiation to his left arm while sitting  and waiting for his mother to be discharged.  At approximately 12 p.m.,  the patient reported this to an RN __________ for his mother who brought  him emergently to [facility] Emergency Room, at which time EKG was  completed revealing an inferior ST elevated MI.  The patient was  transferred emergently to [facility] Cardiac Catheterization Lab for  intervention.   REVIEW OF SYSTEMS:  Positive for chest pain, shortness of breath,  radiation to the left arm, and chronic arthralgia of the knees.   PAST MEDICAL HISTORY:  1. CAD.      a.     Status post inferior myocardial infarction in 1998.      b.     Totally occluded right coronary artery with stents x2.  2. Osteoarthritis of right knee.  3. Hyperlipidemia.  4. Hypertension.  5. Prostatitis.  6. History of bronchitis.  7. Obesity.   PAST SURGICAL HISTORY:  Knee arthroscopy.   SOCIAL HISTORY:  He lives in [place] with his wife.  He is a retired  IT trainer.  He does not smoke.  He does drink  alcohol.  No drug use.   FAMILY HISTORY:  Not obtained secondary to emergent nature of his  admission.   CURRENT ALLERGIES:  None.   CURRENT MEDICATIONS AT HOME:  1. Aspirin 325 p.o. daily.  2. Vytorin 10/80 mg p.o. daily.  3. Quinapril 20 mg p.o. b.i.d.  4. HCTZ 12.5 mg daily.  5. Uroxatral daily.   CURRENT LABORATORY DATA:  CK 130, MB 1.3 and troponin less than 0.05.  Sodium 141, potassium 3.9, chloride 107, CO2 of 28, BUN 10, creatinine  0.92, glucose 93, hemoglobin 16.2, hematocrit 49.3, white blood cells  7.5, and platelets 207.  Chest x-ray has not been completed at the time  of this dictation.  EKG revealing normal sinus rhythm, ventricular rate  of 93 beats per minute with inferior ST elevation with anterior  abnormalities as well.   PHYSICAL EXAMINATION:  VITAL SIGNS:  Blood pressure 159/105, pulse 101,  respirations 26, and O2 sat 100% on 2 L.  The patient weighs 123 kg.  HEENT:  Head is normocephalic and atraumatic.  The patient wears  glasses.  CARDIOVASCULAR:  Regular rate and rhythm without murmurs, rubs, or  gallops.  Pulses are 2+ and equal without bruits.  LUNGS:  Clear to auscultation without wheezes, rales, or rhonchi  anteriorly and laterally.  ABDOMEN:  Obese and nontender, 2+ bowel sounds.  EXTREMITIES:  Without clubbing, cyanosis, or edema.  NEUROLOGIC:  Cranial nerves II through XII are grossly intact.   IMPRESSION:  1. ST-elevated inferior myocardial infarction.  2. Hyperlipidemia.  3. Hypertension.  4. Obesity.   PLAN:  The patient has seen emergently in Cardiac Catheterization Lab by  Dr. Charlies Constable who will complete emergent cardiac catheterization and  possible PCI based upon findings.  Further recommendations per Dr.  Juanda Chance and Dr. Jens Som postprocedure depending upon the patient's  response to treatment throughout hospitalization.      Bettey Mare. Lyman Bishop, NP      Everardo Beals. Juanda Chance, MD, [facility]  Electronically Signed    KML/MEDQ   D:  05/19/2008  T:  05/20/2008  Job:  [id number]

## 2011-01-29 NOTE — Consult Note (Signed)
NAME:  Jeffrey Wiggins, Jeffrey Wiggins NO.:  [id number]   MEDICAL RECORD NO.:  [id number]          PATIENT TYPE:  INP   LOCATION:  2608                         FACILITY:  [facility]   PHYSICIAN:  Sigmund I. Patsi Sears, M.D.DATE OF BIRTH:  Apr 12, 1941   DATE OF CONSULTATION:  10/24/2008  DATE OF DISCHARGE:                                 CONSULTATION   SUBJECTIVE:  Jeffrey Wiggins is a 70 year old male, who presented at [facility] Emergency Room on February 2007 with acute urinary retention.  Urinalysis showed positive nitrites, and white cells and red blood  cells.  The patient was felt to have UTI, treated with Rocephin.  Foley  catheter was placed only with difficulty, and after 3-4 attempts,  catheter was placed.  It is noted that the catheter was draining urine  around the catheter, and was bloody.  It was felt that the catheter  probably traumatized the prostatic fossa.  Soon after the catheter was  placed, the patient became febrile to 101.6, and dropped his blood  pressure.  He was felt to be uroseptic, and because of recent cardiac  stent placement in September 2009, it was elected to admit him to [facility] cardiac step-down unit.  The patient has a past history of:  1. MI, cardiac stent in May 1998, with repeat cardiac stent in      September 2009 for occluded stent.  He now has a drug-eluding stent      in place.  2. Sleep apnea.  3. Arthritis.  4. Coronary artery disease.  5. Hepatitis.  6. Elevated cholesterol.  7. Hypertension.  8. Recent diagnosis of gallstone.  9. History of diverticulosis.   PAST SURGICAL HISTORY:  1. Knee replacement.  2. Vasectomy.  3. Tonsillectomy.   MEDICATIONS:  1. Uroxatral 10 mg p.o. per day.  2. Aspirin 325 mg a day.  3. Coenzyme Q.  4. Diclofenac.  5. Effient 10 mg p.o. per day.  6. Flonase.  7. Hydrochlorothiazide 25 mg a day.  8. Nitroglycerin p.r.n.  9. Omega-3.  10.Quinapril 20 mg per day.  11.Vytorin 10/80.   12.Zyrtec.   ALLERGIES:  None known.   FAMILY HISTORY:  The patient is married, and has 2 son and 2 daughters.   SOCIAL HISTORY:  Alcohol, 1 glass of beer per day.  Caffeine 4-5 glasses  per day.  His mother and father both deceased (coronary artery disease).  Occupational, the patient is currently IT trainer.  Tobacco is 45-pack-year  history, but none x18 years.   REVIEW OF SYSTEMS:  Constitutional review of systems is significant for  suprapubic discomfort with urinary retention.  No flank pain.  The  patient has been febrile, but currently is afebrile and feeling well.  He has no nausea, no vomiting.  Additional review of the cardiovascular,  pulmonary, endocrine, musculoskeletal, gastrointestinal, neurologic, and  psychiatric systems were reviewed, and pertinent findings are noted if  present.   PHYSICAL EXAMINATION:  VITAL SIGNS:  Temperature 102.8, blood pressure  113/70, temperature 98, pulse is 99, and respiratory rate 20.  NECK:  Supple, nontender nodes.  CHEST:  Clear to P and A.  ABDOMEN:  Soft, decreased bowel sounds without organomegaly or masses.  There is no right upper quadrant or right flank pain.  GENITOURINARY:  Normal penis, normal urethra, normal glands.  Foley  catheter is in place with reddish urine, with few clots.  RECTAL:  Not indicated at this examination.  EXTREMITIES:  No cyanosis, no edema.  PSYCHOLOGIC:  Normal orientation to time, person, and place.   IMPRESSION:  Urosepsis, currently being treated with IV antibiotic, but  continues to spike fevers.  The patient has Foley catheter in place with  urine that has been clear.  He is already treated with Uroxatral.   PLAN:  I would advise antibiotic change to culture-sensitive antibiotic.  The patient may need additional IV antibiotic per Internal Medicine.  The patient may be able to have Foley catheter removed in approximately  2 days, when the urine should be clear.  If so, the patient could have   voiding trial on Wednesday morning.  Hopefully cultures will be  available tomorrow.      Sigmund I. Patsi Sears, M.D.  Electronically Signed     SIT/MEDQ  D:  10/24/2008  T:  10/25/2008  Job:  [id number]   cc:   Dr. Moises Blood, M.D.

## 2011-02-01 NOTE — Op Note (Signed)
NAME:  DELORIS, MITTAG NO.:  [id number]   MEDICAL RECORD NO.:  [id number]          PATIENT TYPE:  AMB   LOCATION:  DAY                          FACILITY:  [facility]   PHYSICIAN:  Ollen Gross, M.D.    DATE OF BIRTH:  06-15-1941   DATE OF PROCEDURE:  03/12/2006  DATE OF DISCHARGE:                                 OPERATIVE REPORT   PREOPERATIVE DIAGNOSIS:  Right knee arthrofibrosis.   POSTOPERATIVE DIAGNOSIS:  Right knee arthrofibrosis.   PROCEDURE:  Closed manipulation right knee.   SURGEON:  Ollen Gross, M.D.   ANESTHESIA:  General.   Premanipulation range of motion 10-95, post manipulation range of motion 0-  120.   COMPLICATIONS:  None.   CONDITION:  Stable.   CLINICAL NOTE:  Mr. Ybarbo is a 70 year old male, right total knee  arthroplasty approximately 3 months ago.  He has had significant stiffness  in the knee postoperatively.  He has plateau'd with physical therapy and  still has pain.  Presents now for closed manipulation.   PROCEDURE IN DETAIL:  After successful initiation of general anesthetic  examination under anesthesia showed range 10 to 95.  By placing my chest up  against his proximal tibia and flexing the knee I was able to audibly lyse  the adhesions and get him flexed to at least 120 degrees which was equal to  his opposite knee.  We then were able to mobilize the patella better and get  full extension.  His range then was 0-120.  He was then awakened and  transferred to recovery in stable condition.      Ollen Gross, M.D.  Electronically Signed     FA/MEDQ  D:  03/12/2006  T:  03/13/2006  Job:  [id number]

## 2011-03-18 ENCOUNTER — Encounter: Payer: Self-pay | Admitting: Cardiology

## 2011-04-05 ENCOUNTER — Other Ambulatory Visit: Payer: Self-pay | Admitting: Cardiology

## 2011-04-30 ENCOUNTER — Other Ambulatory Visit: Payer: Self-pay | Admitting: *Deleted

## 2011-04-30 MED ORDER — ATORVASTATIN CALCIUM 80 MG PO TABS: 80.0000 mg | ORAL_TABLET | Freq: Every day | ORAL | Status: DC

## 2011-05-01 ENCOUNTER — Ambulatory Visit: Payer: 59 | Admitting: Cardiology

## 2011-05-09 ENCOUNTER — Other Ambulatory Visit: Payer: Self-pay | Admitting: *Deleted

## 2011-05-09 MED ORDER — ATORVASTATIN CALCIUM 80 MG PO TABS: 80.0000 mg | ORAL_TABLET | Freq: Every day | ORAL | Status: DC

## 2011-05-14 ENCOUNTER — Other Ambulatory Visit: Payer: Self-pay | Admitting: Cardiology

## 2011-05-14 MED ORDER — PRASUGREL HCL 10 MG PO TABS: 10.0000 mg | ORAL_TABLET | Freq: Every day | ORAL | Status: DC

## 2011-05-14 NOTE — Telephone Encounter (Signed)
Please call in a 3 month supply of RX.

## 2011-05-15 ENCOUNTER — Other Ambulatory Visit: Payer: Self-pay

## 2011-05-15 MED ORDER — ATORVASTATIN CALCIUM 80 MG PO TABS: 80.0000 mg | ORAL_TABLET | Freq: Every day | ORAL | Status: DC

## 2011-05-30 ENCOUNTER — Ambulatory Visit (INDEPENDENT_AMBULATORY_CARE_PROVIDER_SITE_OTHER): Payer: 59 | Admitting: Cardiology

## 2011-05-30 ENCOUNTER — Encounter: Payer: Self-pay | Admitting: Cardiology

## 2011-05-30 DIAGNOSIS — I10 Essential (primary) hypertension: Secondary | ICD-10-CM

## 2011-05-30 DIAGNOSIS — I2510 Atherosclerotic heart disease of native coronary artery without angina pectoris: Secondary | ICD-10-CM

## 2011-05-30 NOTE — Assessment & Plan Note (Signed)
Continue aspirin and statin. Discontinue prasugrel. Given dyspnea on exertion will plan myoview of risk stratification.

## 2011-05-30 NOTE — Progress Notes (Signed)
HPI: Jeffrey Wiggins is a very pleasant gentleman with a history of coronary artery disease.  In September 2009, the patient presented with an inferior infarct.  At that time, he was found to have total occlusion of his proximal right coronary due to stent thrombosis.  His ejection fraction was 60%.  He had a drug-eluting stent placed to right coronary artery at that time. He had a 50% circumflex as well.  Echocardiogram in May of 2011 showed normal LV function, mild LAE.  Abdominal ultrasound in 2007 showed no aneurysm. Last carotid Dopplers were performed in May of 2011 and showed a 40-59% right and a 0-39% left stenosis. Followup was recommended in 2 years. ABIs in August of 2011 were normal. Also with cough syncope. I last saw him in August of 2011. Since then, he has some dyspnea on exertion but no orthopnea or PND. He does have some pedal edema. No chest pain, palpitations or syncope.   Current Outpatient Prescriptions  Medication Sig Dispense Refill  . acetaminophen (TYLENOL) 650 MG CR tablet Take 650 mg by mouth every 8 (eight) hours as needed.       Marland Kitchen aspirin 81 MG tablet Take 81 mg by mouth daily.        Marland Kitchen atorvastatin (LIPITOR) 80 MG tablet Take 1 tablet (80 mg total) by mouth daily.  30 tablet  prn  . Cholecalciferol (VITAMIN D3) 1000 UNITS CAPS Take 1 tablet by mouth daily.        . Coenzyme Q10 (CO Q 10) 10 MG CAPS Take 1 tablet by mouth daily.        . fluticasone (FLONASE) 50 MCG/ACT nasal spray Use as directed.       . hydrochlorothiazide (,MICROZIDE/HYDRODIURIL,) 12.5 MG capsule Take 12.5 mg by mouth daily.        Marland Kitchen loratadine (CLARITIN) 10 MG tablet Take 10 mg by mouth daily.        Marland Kitchen losartan (COZAAR) 100 MG tablet TAKE 1 TABLET DAILY  90 tablet  2  . Multiple Vitamin (MULTIVITAMIN) capsule Take 1 capsule by mouth daily.        . nitroGLYCERIN (NITROSTAT) 0.4 MG SL tablet Place 0.4 mg under the tongue every 5 (five) minutes as needed.        . NON FORMULARY Study medication  Anacetrapib       . prasugrel (EFFIENT) 10 MG TABS Take 1 tablet (10 mg total) by mouth daily.  90 tablet  1  . Testosterone (AXIRON TD) Place onto the skin.           Past Medical History  Diagnosis Date  . CAD (coronary artery disease)   . HTN (hypertension)   . Prostatitis   . Hyperlipidemia   . Obesity   . Diverticular disease   . Gallstones   . Hepatitis   . Arthritis   . Sleep apnea   . Benign prostatic hypertrophy   . Cerebrovascular disease   . Sepsis due to Escherichia coli   . UTI (urinary tract infection)     secondary    Past Surgical History  Procedure Date  . Coronary angioplasty with stent placement 01/1997    PTCA/stent to RCA  . Total knee arthroplasty   . Vasectomy   . Tonsillectomy     History   Social History  . Marital Status: Married    Spouse Name: N/A    Number of Children: 4  . Years of Education: N/A   Occupational History  .  CPA    Social History Main Topics  . Smoking status: Former Games developer  . Smokeless tobacco: Not on file   Comment: 35 pack year history, none in 18 years  . Alcohol Use: Yes     1 glass of beer per day  . Drug Use: Not on file  . Sexually Active: Not on file   Other Topics Concern  . Not on file   Social History Narrative  . No narrative on file    ROS: no fevers or chills, productive cough, hemoptysis, dysphasia, odynophagia, melena, hematochezia, dysuria, hematuria, rash, seizure activity, orthopnea, PND, pedal edema, claudication. Remaining systems are negative.  Physical Exam: Well-developed obese in no acute distress.  Skin is warm and dry.  HEENT is normal.  Neck is supple. No thyromegaly.  Chest is clear to auscultation with normal expansion.  Cardiovascular exam is regular rate and rhythm.  Abdominal exam nontender or distended. No masses palpated. Extremities show trace edema. neuro grossly intact  ECG normal sinus rhythm at a rate of 80. Prior septal infarct versus lead reversal. Inferior  infarct.

## 2011-05-30 NOTE — Assessment & Plan Note (Signed)
Blood pressure mildly elevated and also mild increase in pedal edema. Increase hydrochlorothiazide to 25 mg daily. Check potassium and renal function in one week.

## 2011-05-30 NOTE — Assessment & Plan Note (Signed)
Previous cough syncope. No recent episodes.

## 2011-05-30 NOTE — Patient Instructions (Signed)
Your physician wants you to follow-up in: ONE YEAR You will receive a reminder letter in the mail two months in advance. If you don't receive a letter, please call our office to schedule the follow-up appointment.     Your physician has requested that you have a lexiscan myoview. For further information please visit [email or web address] Please follow instruction sheet, as given.   INCREASE HCTZ TO 25 MG ONCE DAILY  Your physician recommends that you return for lab work in: ONE WEEK  STOP EFFIENT  CAROTID U/S IN MAY

## 2011-05-30 NOTE — Assessment & Plan Note (Signed)
Continue aspirin and statin. Followup carotid Dopplers May 2013. 

## 2011-05-30 NOTE — Assessment & Plan Note (Signed)
Continue statin. Lipids and liver monitored by primary care. 

## 2011-06-05 ENCOUNTER — Encounter: Payer: Self-pay | Admitting: *Deleted

## 2011-06-10 ENCOUNTER — Other Ambulatory Visit (INDEPENDENT_AMBULATORY_CARE_PROVIDER_SITE_OTHER): Payer: 59 | Admitting: *Deleted

## 2011-06-10 DIAGNOSIS — I10 Essential (primary) hypertension: Secondary | ICD-10-CM

## 2011-06-10 DIAGNOSIS — I2510 Atherosclerotic heart disease of native coronary artery without angina pectoris: Secondary | ICD-10-CM

## 2011-06-10 LAB — BASIC METABOLIC PANEL
BUN: 15 mg/dL (ref 6–23)
CO2: 31 mEq/L (ref 19–32)
Chloride: 102 mEq/L (ref 96–112)
Glucose, Bld: 108 mg/dL — ABNORMAL HIGH (ref 70–99)
Potassium: 4 mEq/L (ref 3.5–5.1)
Sodium: 140 mEq/L (ref 135–145)

## 2011-06-17 ENCOUNTER — Ambulatory Visit (HOSPITAL_COMMUNITY): Payer: 59 | Attending: Cardiology | Admitting: Radiology

## 2011-06-17 DIAGNOSIS — I2510 Atherosclerotic heart disease of native coronary artery without angina pectoris: Secondary | ICD-10-CM

## 2011-06-17 DIAGNOSIS — R0602 Shortness of breath: Secondary | ICD-10-CM

## 2011-06-17 DIAGNOSIS — I10 Essential (primary) hypertension: Secondary | ICD-10-CM | POA: Insufficient documentation

## 2011-06-17 MED ORDER — REGADENOSON 0.4 MG/5ML IV SOLN
0.4000 mg | Freq: Once | INTRAVENOUS | Status: AC
  Administered 2011-06-17: 0.4 mg via INTRAVENOUS

## 2011-06-17 MED ORDER — TECHNETIUM TC 99M TETROFOSMIN IV KIT
33.0000 | PACK | Freq: Once | INTRAVENOUS | Status: AC | PRN
  Administered 2011-06-17: 33 via INTRAVENOUS

## 2011-06-17 NOTE — Progress Notes (Signed)
[facility] SITE 3 NUCLEAR MED [address] [place] [address] [phone]  Cardiology Nuclear Med Study  Jeffrey Wiggins is a 70 y.o. male [id number] 12/05/1940   Nuclear Med Background Indication for Stress Test:  Evaluation for Ischemia, Stent Patency and PTCA Patency History: 09/09 Angioplasty, 05/11 Echo: EF 55%, 09/09 Heart Catheterization:EF 60% stent occluded, 09/09 STEMI Myocardial Infarction and 09/09 Stents: RCA Cardiac Risk Factors: Carotid Disease, History of Smoking, Hypertension, Lipids and Obesity  Symptoms:  DOE and Palpitations   Nuclear Pre-Procedure Caffeine/Decaff Intake:  None NPO After: 8:00pm   Lungs:  clear IV 0.9% NS with Angio Cath:  20g  IV Site: R Antecubital  IV Started by:  Milana Na, EMT-P  Chest Size (in):  52 Cup Size: n/a  Height: 5' 7.5" (1.715 m)  Weight:  279 lb (126.554 kg)  BMI:  Body mass index is 43.05 kg/(m^2). Tech Comments:  No RX today    Nuclear Med Study 1 or 2 day study: 2 day  Stress Test Type:  Eugenie Birks  Reading MD: Olga Millers, MD  Order Authorizing Provider:  B.Crenshaw  Resting Radionuclide: Technetium 6m Tetrofosmin  Resting Radionuclide Dose: 33.0 mCi   Stress Radionuclide:  Technetium 61m Tetrofosmin  Stress Radionuclide Dose: 33.0 mCi           Stress Protocol Rest HR: 79 Stress HR: 96  Rest BP: 117/76 Stress BP: 145/64  Exercise Time (min): n/a METS: n/a   Predicted Max HR: 151 bpm % Max HR: 63.58 bpm Rate Pressure Product: 01027   Dose of Adenosine (mg):  n/a Dose of Lexiscan: 0.4 mg  Dose of Atropine (mg): n/a Dose of Dobutamine: n/a mcg/kg/min (at max HR)  Stress Test Technologist: Milana Na, EMT-P  Nuclear Technologist:  Domenic Polite, CNMT     Rest Procedure:  Myocardial perfusion imaging was performed at rest 45 minutes following the intravenous administration of Technetium 62m Tetrofosmin. Rest ECG: SR with PACS,PVCS  Stress Procedure:  The patient  received IV Lexiscan 0.4 mg over 15-seconds.  Technetium 57m Tetrofosmin injected at 30-seconds.  There were no significant changes and frq pvcs/pacs with Lexiscan.  Quantitative spect images were obtained after a 45 minute delay. Stress ECG: No significant ST segment change suggestive of ischemia.  QPS Raw Data Images:  Acquisition technically good; mild LVE. Stress Images:  There is decreased uptake in the inferior wall. Rest Images:  There is decreased uptake in the inferior wall, less prominent compared to the stress images. Subtraction (SDS):  These findings are consistent with prior inferior infarct and mild peri-infarct ischemia. Transient Ischemic Dilatation (Normal <1.22):  0.96 Lung/Heart Ratio (Normal <0.45):  0.40  Quantitative Gated Spect Images QGS EDV:  119 ml QGS ESV:  57 ml QGS cine images:  Inferior akinesis; evidence of LVE. QGS EF: 52%  Impression Exercise Capacity:  Lexiscan with no exercise. BP Response:  Normal blood pressure response. Clinical Symptoms:  There is chest pain. ECG Impression:  No significant ST segment change suggestive of ischemia. Comparison with Prior Nuclear Study: No images to compare  Overall Impression:  Abnormal stress nuclear study with moderate size prior inferior infarct and mild peri-infarct ischemia.    Olga Millers

## 2011-06-19 LAB — CBC
HCT: 49.3
HCT: 45.6
HCT: 45.9
Hemoglobin: 15.4
MCHC: 32.9
MCHC: 33.7
MCV: 90.6
MCV: 88.9
Platelets: 207
Platelets: 208
RBC: 5.44
RDW: 16 — ABNORMAL HIGH
WBC: 9.9

## 2011-06-19 LAB — BASIC METABOLIC PANEL
BUN: 10
BUN: 10
BUN: 7
CO2: 28
Calcium: 8.7
Chloride: 107
Chloride: 103
Creatinine, Ser: 0.92
Creatinine, Ser: 1.03
GFR calc non Af Amer: >60
Glucose, Bld: 135 — ABNORMAL HIGH
Potassium: 3.9
Potassium: 4

## 2011-06-19 LAB — APTT: aPTT: 32

## 2011-06-19 LAB — LIPID PANEL
Cholesterol: 96
HDL: 33 — ABNORMAL LOW
LDL Cholesterol: 45
Total CHOL/HDL Ratio: 2.9

## 2011-06-19 LAB — CARDIAC PANEL(CRET KIN+CKTOT+MB+TROPI): Relative Index: 8.2 — ABNORMAL HIGH

## 2011-06-19 LAB — POCT CARDIAC MARKERS: Myoglobin, poc: 130

## 2011-06-20 ENCOUNTER — Ambulatory Visit (HOSPITAL_COMMUNITY): Payer: 59 | Attending: Cardiology | Admitting: Radiology

## 2011-06-20 DIAGNOSIS — R0989 Other specified symptoms and signs involving the circulatory and respiratory systems: Secondary | ICD-10-CM

## 2011-06-20 MED ORDER — TECHNETIUM TC 99M TETROFOSMIN IV KIT
33.0000 | PACK | Freq: Once | INTRAVENOUS | Status: AC | PRN
  Administered 2011-06-20: 33 via INTRAVENOUS

## 2011-08-15 ENCOUNTER — Ambulatory Visit (INDEPENDENT_AMBULATORY_CARE_PROVIDER_SITE_OTHER): Payer: 59 | Admitting: Family Medicine

## 2011-08-15 ENCOUNTER — Encounter: Payer: Self-pay | Admitting: Family Medicine

## 2011-08-15 VITALS — BP 132/74 | HR 68 | Temp 98.2°F | Ht 67.5 in | Wt 276.0 lb

## 2011-08-15 DIAGNOSIS — K219 Gastro-esophageal reflux disease without esophagitis: Secondary | ICD-10-CM

## 2011-08-15 DIAGNOSIS — J309 Allergic rhinitis, unspecified: Secondary | ICD-10-CM

## 2011-08-15 DIAGNOSIS — J069 Acute upper respiratory infection, unspecified: Secondary | ICD-10-CM

## 2011-08-15 DIAGNOSIS — R059 Cough, unspecified: Secondary | ICD-10-CM

## 2011-08-15 DIAGNOSIS — R05 Cough: Secondary | ICD-10-CM

## 2011-08-15 MED ORDER — AZITHROMYCIN 250 MG PO TABS: ORAL_TABLET | ORAL | Status: AC

## 2011-08-15 MED ORDER — OMEPRAZOLE MAGNESIUM 20 MG PO TBEC: 20.0000 mg | DELAYED_RELEASE_TABLET | Freq: Every day | ORAL | Status: DC

## 2011-08-15 MED ORDER — ALBUTEROL SULFATE HFA 108 (90 BASE) MCG/ACT IN AERS: 2.0000 | INHALATION_SPRAY | Freq: Four times a day (QID) | RESPIRATORY_TRACT | Status: DC | PRN

## 2011-08-15 NOTE — Progress Notes (Signed)
Chief Complaint:  Cough--has bronchitis history, having some symptoms-cough,restricted breathing, fatigued x 3-4days  HPI:  Began coughing about 5 days ago, and has been getting worse.  Cough is productive of gray phlegm, getting darker than it was initially. Some wheezing/shortness of breath.  Yesterday, when exercising he had increased shortness of breath during his workout.  Has been having some nasal congestion over the last 2 days, some runny nose (clear, somewhat bloody from R nare). +tactile lowgrade fevers.  Has been having flare of GERD; in the past has had some bronchial irritation related to reflux. Hasn't taken Prilosec in a while  Past Medical History  Diagnosis Date  . CAD (coronary artery disease)   . HTN (hypertension)   . Prostatitis   . Hyperlipidemia   . Obesity   . Diverticular disease   . Gallstones   . Hepatitis age 70  . Arthritis   . Sleep apnea     mild, CPAP wasn't indicated (per pt)  . Benign prostatic hypertrophy   . Cerebrovascular disease   . Sepsis due to Escherichia coli   . UTI (urinary tract infection)     secondary  . Allergy     RHINITIS  . 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 Jens Som); s/p stents  . GERD (gastroesophageal reflux disease)     Past Surgical History  Procedure Date  . Coronary angioplasty with stent placement 01/1997    PTCA/stent to RCA  . 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 Vasectomy   . Tonsillectomy   . Appendectomy   . Transurethral resection of prostate 06/2010  . Knee surgery 05/2010    removing scar tissue R knee (Dr. Lequita Halt)    History   Social History  . Marital Status: Married    Spouse Name: N/A    Number of Children: 4  . Years of Education: N/A   Occupational History  . CPA    Social History Main Topics  . Smoking status: Former Games developer  . Smokeless tobacco: Never Used   Comment: 35 pack year history, none in 18 years  . Alcohol Use: Yes     1 glass of wine per day  . Drug Use: No  .  Sexually Active: Not on file   Other Topics Concern  . Not on file   Social History Narrative  . No narrative on file    Family History  Problem Relation Age of Onset  . Coronary artery disease Mother     deceased  . Coronary artery disease Father     deceased  . Hypertension Father   . Heart disease Father   . Cancer Paternal Uncle   . Hypertension Paternal Grandmother   . Heart disease Paternal Grandmother   . Hypertension Paternal Grandfather   . Heart disease Paternal Grandfather    Current Outpatient Prescriptions on File Prior to Visit  Medication Sig Dispense Refill  . aspirin 81 MG tablet Take 81 mg by mouth daily.        Marland Kitchen atorvastatin (LIPITOR) 80 MG tablet Take 1 tablet (80 mg total) by mouth daily.  30 tablet  prn  . Cholecalciferol (VITAMIN D3) 1000 UNITS CAPS Take 1 tablet by mouth daily.        . Coenzyme Q10 (CO Q 10) 10 MG CAPS Take 1 tablet by mouth daily.        . fluticasone (FLONASE) 50 MCG/ACT nasal spray Use as directed.       Marland Kitchen  hydrochlorothiazide 25 MG tablet Take 1 tablet (25 mg total) by mouth daily.      Marland Kitchen loratadine (CLARITIN) 10 MG tablet Take 10 mg by mouth daily.        Marland Kitchen losartan (COZAAR) 100 MG tablet TAKE 1 TABLET DAILY  90 tablet  2  . Multiple Vitamin (MULTIVITAMIN) capsule Take 1 capsule by mouth daily.        . nitroGLYCERIN (NITROSTAT) 0.4 MG SL tablet Place 0.4 mg under the tongue every 5 (five) minutes as needed.        . NON FORMULARY Study medication Anacetrapib       . Testosterone (AXIRON TD) Place onto the skin.         No Known Allergies  ROS: Denies nausea, vomiting, diarrhea. Denies myalgias (other than soreness from workouts), skin rash, significant weight changes, or other concerns.  Denies chest pain  PHYSICAL EXAM: BP 132/74  Pulse 68  Temp 98.2 F (36.8 C)  Ht 5' 7.5" (1.715 m)  Wt 276 lb (125.193 kg)  BMI 42.59 kg/m2 Well developed, obese male, in no distress, with occasional dry cough HEENT:  PERRL, EOMI,  conjunctiva clear.  Wearing hearing aids.  OP clear, mild cobblestoning.  Nasal mucosa mildly edematous without purulent drainage.  Sinuses nontender Neck: no lymphadenopathy or mass Heart: regular rate and rhythm Lungs: clear bilaterally with good air movement. No wheezes, rales or ronchi Skin: no rash Extremities: no edema Psych: normal mood, affect, hygiene and grooming  ASSESSMENT/PLAN: 1. Cough  albuterol (PROVENTIL HFA;VENTOLIN HFA) 108 (90 BASE) MCG/ACT inhaler, azithromycin (ZITHROMAX Z-PAK) 250 MG tablet  2. GERD (gastroesophageal reflux disease)  omeprazole (PRILOSEC OTC) 20 MG tablet  3. Allergic rhinitis, cause unspecified    4. URI (upper respiratory infection)     Restart Prilosec.  Use the inhaler if having tightness/shortness of breath. If cough and phlegm persists beyond a week, darkens in color, or fevers, start (and complete) the antibiotics  Give TdaP at physical in February

## 2011-08-15 NOTE — Patient Instructions (Signed)
Start back on the Prilosec.  Use the inhaler if having tightness/shortness of breath. If your cough and phlegm persists beyond a week, darkens in color, or you start having fevers, start (and complete) the antibiotics  You will need a TdaP when you come for your physical in February

## 2011-08-28 ENCOUNTER — Other Ambulatory Visit: Payer: Self-pay | Admitting: Cardiology

## 2011-08-28 DIAGNOSIS — I10 Essential (primary) hypertension: Secondary | ICD-10-CM

## 2011-08-28 DIAGNOSIS — I2510 Atherosclerotic heart disease of native coronary artery without angina pectoris: Secondary | ICD-10-CM

## 2011-08-28 MED ORDER — HYDROCHLOROTHIAZIDE 25 MG PO TABS: 25.0000 mg | ORAL_TABLET | Freq: Every day | ORAL | Status: DC

## 2011-08-28 NOTE — Telephone Encounter (Signed)
New Msg: Pt needs refill of HCTZ.

## 2011-09-20 ENCOUNTER — Other Ambulatory Visit: Payer: Self-pay | Admitting: Cardiology

## 2011-11-05 ENCOUNTER — Ambulatory Visit (INDEPENDENT_AMBULATORY_CARE_PROVIDER_SITE_OTHER): Payer: 59 | Admitting: Medical

## 2011-11-05 ENCOUNTER — Encounter: Payer: Self-pay | Admitting: Medical

## 2011-11-05 VITALS — BP 118/70 | HR 68 | Temp 98.3°F | Resp 16 | Wt 278.0 lb

## 2011-11-05 DIAGNOSIS — T148XXA Other injury of unspecified body region, initial encounter: Secondary | ICD-10-CM

## 2011-11-05 DIAGNOSIS — R197 Diarrhea, unspecified: Secondary | ICD-10-CM

## 2011-11-05 DIAGNOSIS — T148 Other injury of unspecified body region: Secondary | ICD-10-CM

## 2011-11-05 DIAGNOSIS — R110 Nausea: Secondary | ICD-10-CM

## 2011-11-05 DIAGNOSIS — R21 Rash and other nonspecific skin eruption: Secondary | ICD-10-CM

## 2011-11-05 DIAGNOSIS — W57XXXA Bitten or stung by nonvenomous insect and other nonvenomous arthropods, initial encounter: Secondary | ICD-10-CM

## 2011-11-05 NOTE — Progress Notes (Signed)
Subjective:   HPI  Jeffrey Wiggins is a 71 y.o. male who presents for c/o diarrhea.  He started feeling bad 3 days ago, had 2 episodes of vomiting on Sunday, had increased belching and acid reflux.  He had eaten pizza and salad that evening.  By that night he had fever up to 100.3.  He started using, which helped with the fever. He felt a little better yesterday in terms of nausea, but this morning had 3 episodes of diarrhea, his stomach feels bloated and, and he is drinking soup, ginger ale, Sprite as he is wart about a stomach bug.   He has no sick contacts with similar..  he had been on Prilosec regularly until about a month ago, but he had stopped for about a month, and he resume this within the last week.  He also realized that he had a tick on his back this morning, went to see his dermatologist this morning to remove the tick, and he had a big rash in the area where the tick had latched on.  His dermatologist prescribed doxycycline, and he planned to go get this in a few minutes.  He only had diarrhea this morning, none last 2 days.  Denies recent travel, no recent undercooked meat.  No other aggravating or relieving factors.    No other c/o.  The following portions of the patient's history were reviewed and updated as appropriate: allergies, current medications, past family history, past medical history, past social history, past surgical history and problem list.  Past Medical History  Diagnosis Date  . CAD (coronary artery disease)   . HTN (hypertension)   . Prostatitis   . Hyperlipidemia   . Obesity   . Diverticular disease   . Gallstones   . Hepatitis age 62  . Arthritis   . Sleep apnea     mild, CPAP wasn't indicated (per pt)  . Benign prostatic hypertrophy   . Cerebrovascular disease   . Sepsis due to Escherichia coli   . UTI (urinary tract infection)     secondary  . Allergy     RHINITIS  . 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 Jens Som); s/p stents  . GERD  (gastroesophageal reflux disease)     No Known Allergies   Review of Systems Gen.: No chills, sweats or weight loss HEENT: Cough but no sore throat, ear pain or sinus pressure Heart: No chest pain or palpitations Lungs: No shortness of breath or wheezing GU: Negative    Objective:   Physical Exam  General appearance: alert, no distress, WD/WN, obese white male Skin: Right lower back with large patch of erythema with a central bite mark, otherwise no rash HEENT: normocephalic, sclerae anicteric, TMs pearly, nares patent, no discharge or erythema, pharynx normal Oral cavity: MMM, no lesions Neck: supple, no lymphadenopathy, no thyromegaly, no masses Heart: RRR, normal S1, S2, no murmurs Lungs: CTA bilaterally, no wheezes, rhonchi, or rales Abdomen: +increased bs, soft, non tender, slightly distended, no masses, no hepatomegaly, no splenomegaly Pulses: 2+ symmetric, upper and lower extremities, normal cap refill   Assessment and Plan :     Encounter Diagnoses  Name Primary?  . Diarrhea   . Nausea   . Tick bite Yes  . Rash    Advised that given his current symptoms and exam findings, he could have a mild stomach virus, but he also has symptoms suggestive of tick borne illness. He will begin doxycycline today, he'll hydrate well, will use  precautions assuming that this could also be a stomach virus.  We discussed usual course of illness with stomach virus versus tickborne illness. He will call return if worsening symptoms, particularly fever, worse diarrhea, worse nausea. He declines a nausea medication today.

## 2011-12-12 ENCOUNTER — Ambulatory Visit (INDEPENDENT_AMBULATORY_CARE_PROVIDER_SITE_OTHER): Payer: 59 | Admitting: Family Medicine

## 2011-12-12 ENCOUNTER — Encounter: Payer: Self-pay | Admitting: Family Medicine

## 2011-12-12 ENCOUNTER — Telehealth: Payer: Self-pay | Admitting: *Deleted

## 2011-12-12 VITALS — BP 130/78 | HR 72 | Temp 99.1°F | Ht 67.5 in | Wt 277.0 lb

## 2011-12-12 DIAGNOSIS — J111 Influenza due to unidentified influenza virus with other respiratory manifestations: Secondary | ICD-10-CM

## 2011-12-12 DIAGNOSIS — R059 Cough, unspecified: Secondary | ICD-10-CM

## 2011-12-12 DIAGNOSIS — R509 Fever, unspecified: Secondary | ICD-10-CM

## 2011-12-12 DIAGNOSIS — J101 Influenza due to other identified influenza virus with other respiratory manifestations: Secondary | ICD-10-CM

## 2011-12-12 DIAGNOSIS — R05 Cough: Secondary | ICD-10-CM

## 2011-12-12 MED ORDER — OSELTAMIVIR PHOSPHATE 75 MG PO CAPS: 75.0000 mg | ORAL_CAPSULE | Freq: Two times a day (BID) | ORAL | Status: DC

## 2011-12-12 NOTE — Patient Instructions (Signed)
Take Tamiflu twice daily for 5 days. Continue tylenol for pain control and fever Continue Delsym as cough suppressant, mucinex as needed for thick phlegm Start back on Claritin as I think allergies are starting up again also  Return if fevers remain high, worsening cough, shortness of breath, chest pain, etc.  Influenza Facts Flu (influenza) is a contagious respiratory illness caused by the influenza viruses. It can cause mild to severe illness. While most healthy people recover from the flu without specific treatment and without complications, older people, young children, and people with certain health conditions are at higher risk for serious complications from the flu, including death. CAUSES   The flu virus is spread from person to person by respiratory droplets from coughing and sneezing.   A person can also become infected by touching an object or surface with a virus on it and then touching their mouth, eye or nose.   Adults may be able to infect others from 1 day before symptoms occur and up to 7 days after getting sick. So it is possible to give someone the flu even before you know you are sick and continue to infect others while you are sick.  SYMPTOMS   Fever (usually high).   Headache.   Tiredness (can be extreme).   Cough.   Sore throat.   Runny or stuffy nose.   Body aches.   Diarrhea and vomiting may also occur, particularly in children.   These symptoms are referred to as "flu-like symptoms". A lot of different illnesses, including the common cold, can have similar symptoms.  DIAGNOSIS   There are tests that can determine if you have the flu as long you are tested within the first 2 or 3 days of illness.   A doctor's exam and additional tests may be needed to identify if you have a disease that is a complicating the flu.  RISKS AND COMPLICATIONS  Some of the complications caused by the flu include:  Bacterial pneumonia or progressive pneumonia caused by the  flu virus.   Loss of body fluids (dehydration).   Worsening of chronic medical conditions, such as heart failure, asthma, or diabetes.   Sinus problems and ear infections.  HOME CARE INSTRUCTIONS   Seek medical care early on.   If you are at high risk from complications of the flu, consult your health-care provider as soon as you develop flu-like symptoms. Those at high risk for complications include:   People 65 years or older.   People with chronic medical conditions, including diabetes.   Pregnant women.   Young children.   Your caregiver may recommend use of an antiviral medication to help treat the flu.   If you get the flu, get plenty of rest, drink a lot of liquids, and avoid using alcohol and tobacco.   You can take over-the-counter medications to relieve the symptoms of the flu if your caregiver approves. (Never give aspirin to children or teenagers who have flu-like symptoms, particularly fever).  PREVENTION  The single best way to prevent the flu is to get a flu vaccine each fall. Other measures that can help protect against the flu are:  Antiviral Medications   A number of antiviral drugs are approved for use in preventing the flu. These are prescription medications, and a doctor should be consulted before they are used.   Habits for Good Health   Cover your nose and mouth with a tissue when you cough or sneeze, throw the tissue away after  you use it.   Wash your hands often with soap and water, especially after you cough or sneeze. If you are not near water, use an alcohol-based hand cleaner.   Avoid people who are sick.   If you get the flu, stay home from work or school. Avoid contact with other people so that you do not make them sick, too.   Try not to touch your eyes, nose, or mouth as germs ore often spread this way.  IN CHILDREN, EMERGENCY WARNING SIGNS THAT NEED URGENT MEDICAL ATTENTION:  Fast breathing or trouble breathing.   Bluish skin color.     Not drinking enough fluids.   Not waking up or not interacting.   Being so irritable that the child does not want to be held.   Flu-like symptoms improve but then return with fever and worse cough.   Fever with a rash.  IN ADULTS, EMERGENCY WARNING SIGNS THAT NEED URGENT MEDICAL ATTENTION:  Difficulty breathing or shortness of breath.   Pain or pressure in the chest or abdomen.   Sudden dizziness.   Confusion.   Severe or persistent vomiting.  SEEK IMMEDIATE MEDICAL CARE IF:  You or someone you know is experiencing any of the symptoms above. When you arrive at the emergency center,report that you think you have the flu. You may be asked to wear a mask and/or sit in a secluded area to protect others from getting sick. MAKE SURE YOU:   Understand these instructions.   Monitor your condition.   Seek medical care if you are getting worse, or not improving.  Document Released: 09/05/2003 Document Revised: 08/22/2011 Document Reviewed: 06/01/2009 [facility] Patient Information 2012 [place], [place].

## 2011-12-12 NOTE — Telephone Encounter (Signed)
Called [facility] and they will cancel e scribed order for Tamiflu.

## 2011-12-12 NOTE — Progress Notes (Signed)
Chief complaint:  Cough.   started Mon afternoon, wife diagnosed with flu Tues-thinks he may have as well.   HPI:  Started coughing 3 days ago, while driving home after exercising.  Has been using Delsym with good results. Initially cough was productive of thick white phlegm, but that has stopped.  Cough continues, dry, nonproductive.  Seems worse in the afternoons, and evenings. Had a lot of runny nose yesterday, clear.  Denies itchy eyes/ears.  Took Claritin one night, which helped some. Felt achey yesterday, but always feels a little achey after exercising, so didn't think too much of it at first. Had chills yesterday, and temp 101.5 yesterday afternoon. Wife with influenza A.  He took one of his wife's Tamiflu pills yesterday.  Past Medical History  Diagnosis Date  . CAD (coronary artery disease)   . HTN (hypertension)   . Prostatitis   . Hyperlipidemia   . Obesity   . Diverticular disease   . Gallstones   . Hepatitis age 71  . Arthritis   . Sleep apnea     mild, CPAP wasn't indicated (per pt)  . Benign prostatic hypertrophy   . Cerebrovascular disease   . Sepsis due to Escherichia coli   . UTI (urinary tract infection)     secondary  . Allergy     RHINITIS  . 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 Jens Som); s/p stents  . GERD (gastroesophageal reflux disease)     Past Surgical History  Procedure Date  . Coronary angioplasty with stent placement 01/1997    PTCA/stent to RCA  . 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 Vasectomy   . Tonsillectomy   . Appendectomy   . Transurethral resection of prostate 06/2010  . Knee surgery 05/2010    removing scar tissue R knee (Dr. Lequita Halt)    History   Social History  . Marital Status: Married    Spouse Name: N/A    Number of Children: 4  . Years of Education: N/A   Occupational History  . CPA    Social History Main Topics  . Smoking status: Former Games developer  . Smokeless tobacco: Never Used   Comment: 35 pack year history,  none in 18 years  . Alcohol Use: Yes     1 glass of wine per day  . Drug Use: No  . Sexually Active: Not on file   Other Topics Concern  . Not on file   Social History Narrative  . No narrative on file    Family History  Problem Relation Age of Onset  . Coronary artery disease Mother     deceased  . Coronary artery disease Father     deceased  . Hypertension Father   . Heart disease Father   . Cancer Paternal Uncle   . Hypertension Paternal Grandmother   . Heart disease Paternal Grandmother   . Hypertension Paternal Grandfather   . Heart disease Paternal Grandfather    Current Outpatient Prescriptions on File Prior to Visit  Medication Sig Dispense Refill  . acetaminophen (TYLENOL) 500 MG tablet Take 1,000 mg by mouth as needed.        Marland Kitchen albuterol (PROVENTIL HFA;VENTOLIN HFA) 108 (90 BASE) MCG/ACT inhaler Inhale 2 puffs into the lungs every 6 (six) hours as needed for wheezing.  18 g  1  . aspirin 81 MG tablet Take 81 mg by mouth daily.        Marland Kitchen atorvastatin (LIPITOR) 80 MG tablet Take  1 tablet (80 mg total) by mouth daily.  30 tablet  prn  . Cholecalciferol (VITAMIN D3) 1000 UNITS CAPS Take 2 tablets by mouth daily.       . Coenzyme Q10 (CO Q 10) 10 MG CAPS Take 1 tablet by mouth daily.        . fluticasone (FLONASE) 50 MCG/ACT nasal spray Use as directed.       . hydrochlorothiazide (HYDRODIURIL) 25 MG tablet Take 1 tablet (25 mg total) by mouth daily.  30 tablet  12  . loratadine (CLARITIN) 10 MG tablet Take 10 mg by mouth daily.        Marland Kitchen losartan (COZAAR) 100 MG tablet TAKE 1 TABLET DAILY  90 tablet  1  . Multiple Vitamin (MULTIVITAMIN) capsule Take 1 capsule by mouth daily.        . nitroGLYCERIN (NITROSTAT) 0.4 MG SL tablet Place 0.4 mg under the tongue every 5 (five) minutes as needed.        . NON FORMULARY Study medication Anacetrapib       . omeprazole (PRILOSEC OTC) 20 MG tablet Take 1 tablet (20 mg total) by mouth daily.      . Testosterone (AXIRON TD) Place  onto the skin.          No Known Allergies  ROS:  Denies nausea, vomiting, diarrhea, skin rash, sore throat, ear pain.  See HPI.  Denies chest pain, shortness of breath  PHYSICAL EXAM: BP 130/78  Pulse 72  Temp(Src) 99.1 F (37.3 C) (Oral)  Ht 5' 7.5" (1.715 m)  Wt 277 lb (125.646 kg)  BMI 42.74 kg/m2 Well developed, pleasant male, not ill appearing, rare cough HEENT:  PERRL, EOMI, conjunctiva clear.  Nasal mucosa mildly edematous, pale, clear mucus.  Sinuses nontender.  Op clear. Neck: no lymphadenopathy or mass Heart: regular rate and rhythm Lungs: clear bilaterally Extremities: no edema Skin: no rash Psych: normal mood, affect, hygiene and grooming  ASSESSMENT/PLAN:  1. Cough  POCT Influenza A/B  2. Fever  POCT Influenza A/B  3. Influenza A  oseltamivir (TAMIFLU) 75 MG capsule, DISCONTINUED: oseltamivir (TAMIFLU) 75 MG capsule   Supportive measures reviewed.  Took first pill of Tamiflu (his wife's) within 48 hours period, so, given his age, will continue course of treatment

## 2011-12-17 ENCOUNTER — Telehealth: Payer: Self-pay | Admitting: Family Medicine

## 2011-12-17 NOTE — Telephone Encounter (Signed)
Pt called, flu not improving, significant congestion, chest rattling, fever.  He did make an appt for tomorrow.  Please let him know if you want to call him in something else or just wait and see him.  Pt wants the rx but his wife wants him to be seen.  Pt can be reached at [phone].   If rx send to [facility].

## 2011-12-17 NOTE — Telephone Encounter (Signed)
Advised pt of same. 

## 2011-12-17 NOTE — Telephone Encounter (Signed)
Continue Mucinex, Delsym.  I will not rx antibiotics without evaluating him (would treat differently if pneumonia vs sinuses vs bronchitis).  Can see Jeffrey Wiggins today if he prefers not to wait until tomorrow

## 2011-12-18 ENCOUNTER — Ambulatory Visit (INDEPENDENT_AMBULATORY_CARE_PROVIDER_SITE_OTHER): Payer: 59 | Admitting: Family Medicine

## 2011-12-18 ENCOUNTER — Encounter: Payer: Self-pay | Admitting: Family Medicine

## 2011-12-18 VITALS — BP 132/80 | HR 88 | Temp 98.9°F | Ht 67.5 in | Wt 272.0 lb

## 2011-12-18 DIAGNOSIS — R05 Cough: Secondary | ICD-10-CM

## 2011-12-18 DIAGNOSIS — R059 Cough, unspecified: Secondary | ICD-10-CM

## 2011-12-18 DIAGNOSIS — J329 Chronic sinusitis, unspecified: Secondary | ICD-10-CM

## 2011-12-18 MED ORDER — BENZONATATE 200 MG PO CAPS: 200.0000 mg | ORAL_CAPSULE | Freq: Three times a day (TID) | ORAL | Status: AC | PRN

## 2011-12-18 MED ORDER — ALBUTEROL SULFATE HFA 108 (90 BASE) MCG/ACT IN AERS: 2.0000 | INHALATION_SPRAY | Freq: Four times a day (QID) | RESPIRATORY_TRACT | Status: DC | PRN

## 2011-12-18 MED ORDER — AZITHROMYCIN 250 MG PO TABS: ORAL_TABLET | ORAL | Status: AC

## 2011-12-18 MED ORDER — ALBUTEROL SULFATE (2.5 MG/3ML) 0.083% IN NEBU: 2.5000 mg | INHALATION_SOLUTION | Freq: Four times a day (QID) | RESPIRATORY_TRACT | Status: DC | PRN

## 2011-12-18 MED ORDER — ALBUTEROL SULFATE (2.5 MG/3ML) 0.083% IN NEBU: 2.5000 mg | INHALATION_SOLUTION | Freq: Once | RESPIRATORY_TRACT | Status: DC

## 2011-12-18 MED ORDER — HYDROCOD POLST-CHLORPHEN POLST 10-8 MG/5ML PO LQCR: 5.0000 mL | Freq: Every evening | ORAL | Status: DC | PRN

## 2011-12-18 NOTE — Progress Notes (Signed)
Chief complaint: cough and low grade fever. Worse in the afternoon and he may cough for 12 hrs. Nasal mucus yellow and bloody  HPI:  Patient was diagnosed with Influenza A last week, and has taken 5 days of Tamiflu.  He is continuing to have low grade fevers (99.5  yesterday).  Has persistent cough, productive only in the mornings, yellow/green.  Cough gets worse later in the day, dry and hacky, causing him to gag. No post-tussive emesis.  His upper teeth have felt more sensitive than usual.  Not having any nasal drainage during the day, just some thick yellow/green in the mornings.  L nostril had a lot of blood this morning. Complaining of "rattling" in his chest, which causes him to cough.  Denies tickle in back of throat. Denies shortness of breath. Has been using Delsym and Mucinex (plain) without much relief.  Past Medical History  Diagnosis Date  . CAD (coronary artery disease)   . HTN (hypertension)   . Prostatitis   . Hyperlipidemia   . Obesity   . Diverticular disease   . Gallstones   . Hepatitis age 71  . Arthritis   . Sleep apnea     mild, CPAP wasn't indicated (per pt)  . Benign prostatic hypertrophy   . Cerebrovascular disease   . Sepsis due to Escherichia coli   . UTI (urinary tract infection)     secondary  . Allergy     RHINITIS  . 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 Jens Som); s/p stents  . GERD (gastroesophageal reflux disease)     Past Surgical History  Procedure Date  . Coronary angioplasty with stent placement 01/1997    PTCA/stent to RCA  . 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 Vasectomy   . Tonsillectomy   . Appendectomy   . Transurethral resection of prostate 06/2010  . Knee surgery 05/2010    removing scar tissue R knee (Dr. Lequita Halt)    History   Social History  . Marital Status: Married    Spouse Name: N/A    Number of Children: 4  . Years of Education: N/A   Occupational History  . CPA    Social History Main Topics  . Smoking  status: Former Games developer  . Smokeless tobacco: Never Used   Comment: 35 pack year history, none in 18 years  . Alcohol Use: Yes     1 glass of wine per day  . Drug Use: No  . Sexually Active: Not on file   Other Topics Concern  . Not on file   Social History Narrative  . No narrative on file    Family History  Problem Relation Age of Onset  . Coronary artery disease Mother     deceased  . Coronary artery disease Father     deceased  . Hypertension Father   . Heart disease Father   . Cancer Paternal Uncle   . Hypertension Paternal Grandmother   . Heart disease Paternal Grandmother   . Hypertension Paternal Grandfather   . Heart disease Paternal Grandfather    Current Outpatient Prescriptions on File Prior to Visit  Medication Sig Dispense Refill  . acetaminophen (TYLENOL) 500 MG tablet Take 1,000 mg by mouth as needed.        Marland Kitchen aspirin 81 MG tablet Take 81 mg by mouth daily.        Marland Kitchen atorvastatin (LIPITOR) 80 MG tablet Take 1 tablet (80 mg total) by mouth daily.  30 tablet  prn  . Cholecalciferol (VITAMIN D3) 1000 UNITS CAPS Take 2 tablets by mouth daily.       . Coenzyme Q10 (CO Q 10) 10 MG CAPS Take 1 tablet by mouth daily.        . fluticasone (FLONASE) 50 MCG/ACT nasal spray Use as directed.       . hydrochlorothiazide (HYDRODIURIL) 25 MG tablet Take 1 tablet (25 mg total) by mouth daily.  30 tablet  12  . loratadine (CLARITIN) 10 MG tablet Take 10 mg by mouth daily.        Marland Kitchen losartan (COZAAR) 100 MG tablet TAKE 1 TABLET DAILY  90 tablet  1  . Multiple Vitamin (MULTIVITAMIN) capsule Take 1 capsule by mouth daily.        . nitroGLYCERIN (NITROSTAT) 0.4 MG SL tablet Place 0.4 mg under the tongue every 5 (five) minutes as needed.        . NON FORMULARY Study medication Anacetrapib       . omeprazole (PRILOSEC OTC) 20 MG tablet Take 1 tablet (20 mg total) by mouth daily.      . Testosterone (AXIRON TD) Place onto the skin.        Marland Kitchen DISCONTD: albuterol (PROVENTIL HFA;VENTOLIN  HFA) 108 (90 BASE) MCG/ACT inhaler Inhale 2 puffs into the lungs every 6 (six) hours as needed for wheezing.  18 g  1    No Known Allergies  ROS:  Denies nausea, vomiting, diarrhea, skin rash, headaches, joint pains.  +cough, sinus pain.  See HPI  PHYSICAL EXAM: BP 132/80  Pulse 88  Temp(Src) 98.9 F (37.2 C) (Oral)  Ht 5' 7.5" (1.715 m)  Wt 272 lb (123.378 kg)  BMI 41.97 kg/m2  SpO2 97% Well developed, pleasant, obese male, with frequent dry cough. HEENT: Nasal mucosa moderately edematous, white drainage on right, some bloody crusting on left.  Sinuses nontender.  OP clear Neck: no lymphadenopathy Heart: regular rate and rhythm Lungs: very coarse breath sounds with some squeaky wheezing throughout.  After neb: pt felt much better, less rattling in his chest, less cough.  On exam, improved air movement.  Still slightly coarse on right only. Skin: no rash Extremities: no edema  ASSESSMENT/PLAN: 1. Cough  DISCONTINUED: albuterol (PROVENTIL) (2.5 MG/3ML) 0.083% nebulizer solution, DISCONTINUED: albuterol (PROVENTIL) (2.5 MG/3ML) 0.083% nebulizer solution  2. Sinusitis     Sinusitis--zpak RAD--albuterol Discussed sinus rinses; continue mucinez Cough--tessalon and tussionex--risks and side effects reviewed.  Reassured no evidence of pneumonia.  F/u if symptoms persist/worsen despite Z-pak.

## 2011-12-18 NOTE — Patient Instructions (Signed)
Stop the Delsym. Continue the mucinex. Take the z-pak as directed Use tessalon during the day for cough, and Tussionex syrup at bedtime--remember it works for 12 hours, and causes sedation Use the inhaler when you feel tightness in your chest, or dry, hacky cough.  If fevers, shortness of breath, and cough persist or worsen, please return for re-evaluation.

## 2011-12-24 ENCOUNTER — Telehealth: Payer: Self-pay | Admitting: Internal Medicine

## 2011-12-24 NOTE — Telephone Encounter (Signed)
pt states that he was prescribed the solution to go into the nebulizer but was not prescribed the nebulizer but he was told he could get one at [facility] so he went and they told him they had all been recalled so they could not sell him any that he would need to have his doctor write a prescription for him to get it at [facility] discount medical supply. Number to that place is [phone]. Please call pt and let him know when it can be ordered

## 2011-12-24 NOTE — Telephone Encounter (Signed)
According to the computer (and my intention)--he was supposed to have received an inhaler (albuterol), not nebulizer solution.  He was treated with neb in office, but prescribed an inhaler.  If he is having trouble with the inhaler, then can add an aerochamber/spacer to the MDI to assist in proper delivery of medication.  Please clarify with patient.

## 2011-12-25 ENCOUNTER — Telehealth: Payer: Self-pay | Admitting: *Deleted

## 2011-12-25 NOTE — Telephone Encounter (Signed)
Pt is fine using the inhaler, pt was confused on why the nebulizer solution was sent to the pharmacy but Per Dr. Lynelle Doctor it was an error entered in the computer wrong. Suzette Battiest will call and clarify with pt it was an error

## 2011-12-25 NOTE — Telephone Encounter (Signed)
Spoke with patient and clarified any confusion. He knows that he is only to use the inhaler and the nebulizer solution was an error. He did state that he is feeling better.

## 2012-01-10 ENCOUNTER — Other Ambulatory Visit: Payer: Self-pay | Admitting: Medical

## 2012-01-10 ENCOUNTER — Telehealth: Payer: Self-pay | Admitting: Internal Medicine

## 2012-01-10 MED ORDER — BENZONATATE 200 MG PO CAPS: 200.0000 mg | ORAL_CAPSULE | Freq: Two times a day (BID) | ORAL | Status: AC | PRN

## 2012-01-13 NOTE — Telephone Encounter (Signed)
done

## 2012-01-21 ENCOUNTER — Other Ambulatory Visit (HOSPITAL_COMMUNITY): Payer: Self-pay | Admitting: Orthopedic Surgery

## 2012-01-21 DIAGNOSIS — M25561 Pain in right knee: Secondary | ICD-10-CM

## 2012-01-23 ENCOUNTER — Encounter (HOSPITAL_COMMUNITY)
Admission: RE | Admit: 2012-01-23 | Discharge: 2012-01-23 | Disposition: A | Discharge status: Home / Self Care | Payer: 59 | Source: Ambulatory Visit | Attending: Orthopedic Surgery | Admitting: Orthopedic Surgery

## 2012-01-23 ENCOUNTER — Encounter (HOSPITAL_COMMUNITY): Payer: Self-pay

## 2012-01-23 DIAGNOSIS — M25561 Pain in right knee: Secondary | ICD-10-CM

## 2012-01-23 DIAGNOSIS — Z96659 Presence of unspecified artificial knee joint: Secondary | ICD-10-CM | POA: Insufficient documentation

## 2012-01-23 DIAGNOSIS — M25569 Pain in unspecified knee: Secondary | ICD-10-CM | POA: Insufficient documentation

## 2012-01-23 MED ORDER — TECHNETIUM TC 99M MEDRONATE IV KIT
24.5000 | PACK | Freq: Once | INTRAVENOUS | Status: AC | PRN
  Administered 2012-01-23: 24.5 via INTRAVENOUS

## 2012-01-24 ENCOUNTER — Other Ambulatory Visit: Payer: Self-pay | Admitting: Family Medicine

## 2012-02-03 ENCOUNTER — Telehealth: Payer: Self-pay | Admitting: Family Medicine

## 2012-02-03 ENCOUNTER — Other Ambulatory Visit: Payer: Self-pay | Admitting: Family Medicine

## 2012-02-03 NOTE — Telephone Encounter (Signed)
Pt called he only received 1 bottle of the Flonase 50 mcg and was supposed to get a 3 month supply.  I called [place] at [facility] [phone] and advised 3 mo supply with 2 refills.  It appears 16 g bottle of Flonase was ordered and only recd 50 mcg.

## 2012-02-03 NOTE — Telephone Encounter (Signed)
Completed by Lafonda Mosses

## 2012-02-27 ENCOUNTER — Other Ambulatory Visit: Payer: Self-pay | Admitting: *Deleted

## 2012-02-27 DIAGNOSIS — I6529 Occlusion and stenosis of unspecified carotid artery: Secondary | ICD-10-CM

## 2012-02-28 ENCOUNTER — Encounter (INDEPENDENT_AMBULATORY_CARE_PROVIDER_SITE_OTHER): Payer: 59

## 2012-02-28 DIAGNOSIS — I6529 Occlusion and stenosis of unspecified carotid artery: Secondary | ICD-10-CM

## 2012-03-05 ENCOUNTER — Telehealth: Payer: Self-pay | Admitting: Cardiology

## 2012-03-05 MED ORDER — NITROGLYCERIN 0.4 MG SL SUBL: 0.4000 mg | SUBLINGUAL_TABLET | SUBLINGUAL | Status: DC | PRN

## 2012-03-05 NOTE — Telephone Encounter (Signed)
Patient returning nurse call, he can be reached at [phone].

## 2012-03-05 NOTE — Telephone Encounter (Signed)
Spoke with pt, aware of carotid results 

## 2012-03-12 ENCOUNTER — Telehealth: Payer: Self-pay | Admitting: *Deleted

## 2012-03-12 NOTE — Telephone Encounter (Signed)
Spoke with patient re: surgical clearance. He scheduled CPE for Monday 03/16/12 @ 3:15pm (nonfasting) and stated that he would like to see you first and then have labs drawn after CPE if this is okay. He is involved in a lipid study through [facility] and will not be needing a lipid panel. He will also call and set up appt with Dr.Crenshaw for additional clearance. Just an FYI. Thanks.

## 2012-03-16 ENCOUNTER — Ambulatory Visit (INDEPENDENT_AMBULATORY_CARE_PROVIDER_SITE_OTHER): Payer: 59 | Admitting: Family Medicine

## 2012-03-16 ENCOUNTER — Encounter: Payer: Self-pay | Admitting: Family Medicine

## 2012-03-16 VITALS — BP 122/78 | HR 72 | Ht 67.0 in | Wt 277.0 lb

## 2012-03-16 DIAGNOSIS — I2510 Atherosclerotic heart disease of native coronary artery without angina pectoris: Secondary | ICD-10-CM

## 2012-03-16 DIAGNOSIS — Z23 Encounter for immunization: Secondary | ICD-10-CM

## 2012-03-16 DIAGNOSIS — Z0000 Encounter for general adult medical examination without abnormal findings: Secondary | ICD-10-CM

## 2012-03-16 DIAGNOSIS — R5383 Other fatigue: Secondary | ICD-10-CM

## 2012-03-16 DIAGNOSIS — R5381 Other malaise: Secondary | ICD-10-CM

## 2012-03-16 DIAGNOSIS — L570 Actinic keratosis: Secondary | ICD-10-CM

## 2012-03-16 DIAGNOSIS — R7301 Impaired fasting glucose: Secondary | ICD-10-CM

## 2012-03-16 DIAGNOSIS — E669 Obesity, unspecified: Secondary | ICD-10-CM

## 2012-03-16 DIAGNOSIS — K219 Gastro-esophageal reflux disease without esophagitis: Secondary | ICD-10-CM

## 2012-03-16 LAB — POCT URINALYSIS DIPSTICK
Blood, UA: NEGATIVE
Ketones, UA: NEGATIVE
Protein, UA: NEGATIVE
Spec Grav, UA: 1.01
pH, UA: 5

## 2012-03-16 NOTE — Patient Instructions (Signed)

## 2012-03-16 NOTE — Progress Notes (Signed)
Jeffrey Wiggins is a 71 y.o. male who presents for a complete physical.  He has the following concerns:  R knee revision of TKR scheduled for 9/25 with Dr. Lequita Halt, for which he needs medical clearance.  He has known CAD, and is followed by Dr. Jens Som.  He is having knee stiffness, and pain after sitting. He denies any chest pain or pressure, but has some dyspnea with exertion, at his baseline-- DOE coming up 17 stairs with laundry.  Denies any dyspnea with cardio at the gym.  BP's at home are running 120-125/70-80. GERD--controlled with meds.  Gets recurrent reflux if he misses his pills for a few days.  Dr. Patsi Sears --sees yearly for GU exam, treatment of hypogonadism and ED. Dr. Reinaldo Berber CAD.  He is involved in a lipid study, so lipids are checked through that study. He states that liver and lipids are checked at least q6 months, and doesn't need to be checked elsewhere.  Health Maintenance: Immunization History  Administered Date(s) Administered  . Influenza Split 06/14/2011  . Pneumococcal Conjugate 08/20/2007  . Td 08/16/2002   Last colonoscopy: 2010 Last PSA: per Dr. Patsi Sears, yearly Dentist: twice yearly Ophtho: once or twice a year Exercise: joined fitness center in 01/2011, and tries to go 3x/week for 60-90 minutes, cardio and strength training. His wife has started doing Weight Watchers, so he will also be doing it.  Past Medical History  Diagnosis Date  . CAD (coronary artery disease)   . HTN (hypertension)   . Prostatitis   . Hyperlipidemia   . Obesity   . Diverticular disease   . Gallstones   . Hepatitis age 16  . Arthritis   . Sleep apnea     mild, CPAP wasn't indicated (per pt)  . Benign prostatic hypertrophy     followed by Dr. Patsi Sears  . Cerebrovascular disease   . Sepsis due to Escherichia coli   . UTI (urinary tract infection)     secondary  . Allergy     RHINITIS  . 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 Jens Som); s/p stents  . GERD  (gastroesophageal reflux disease)   . Hypogonadism male     treated by Dr. Patsi Sears  . Erectile dysfunction     Past Surgical History  Procedure Date  . Coronary angioplasty with stent placement 01/1997    PTCA/stent to RCA  . 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 Vasectomy   . Tonsillectomy   . Appendectomy   . Transurethral resection of prostate 06/2010  . Knee surgery 05/2010    removing scar tissue R knee (Dr. Lequita Halt)    History   Social History  . Marital Status: Married    Spouse Name: N/A    Number of Children: 4  . Years of Education: N/A   Occupational History  . CPA    Social History Main Topics  . Smoking status: Former Games developer  . Smokeless tobacco: Never Used   Comment: 35 pack year history, none in 18 years  . Alcohol Use: Yes     1 glass of wine per day  . Drug Use: No  . Sexually Active: Not on file   Other Topics Concern  . Not on file   Social History Narrative  . No narrative on file    Family History  Problem Relation Age of Onset  . Coronary artery disease Mother     deceased  . Coronary artery disease Father  deceased  . Hypertension Father   . Heart disease Father   . Cancer Paternal Uncle   . Hypertension Paternal Grandmother   . Heart disease Paternal Grandmother   . Hypertension Paternal Grandfather   . Heart disease Paternal Grandfather    Current Outpatient Prescriptions on File Prior to Visit  Medication Sig Dispense Refill  . acetaminophen (TYLENOL) 500 MG tablet Take 1,000 mg by mouth as needed.        Marland Kitchen aspirin 81 MG tablet Take 81 mg by mouth daily.        Marland Kitchen atorvastatin (LIPITOR) 80 MG tablet Take 1 tablet (80 mg total) by mouth daily.  30 tablet  prn  . Cholecalciferol (VITAMIN D3) 1000 UNITS CAPS Take 2 tablets by mouth daily.       . fluticasone (FLONASE) 50 MCG/ACT nasal spray USE 2 SPRAYS IN EACH NOSTRIL DAILY  16 g  2  . hydrochlorothiazide (HYDRODIURIL) 25 MG tablet Take 1 tablet (25 mg total) by mouth  daily.  30 tablet  12  . loratadine (CLARITIN) 10 MG tablet Take 10 mg by mouth daily.        Marland Kitchen losartan (COZAAR) 100 MG tablet TAKE 1 TABLET DAILY  90 tablet  1  . Multiple Vitamin (MULTIVITAMIN) capsule Take 1 capsule by mouth daily.        . NON FORMULARY Study medication Anacetrapib       . omeprazole (PRILOSEC OTC) 20 MG tablet Take 1 tablet (20 mg total) by mouth daily.      . Testosterone (AXIRON TD) Place onto the skin.        Marland Kitchen albuterol (PROAIR HFA) 108 (90 BASE) MCG/ACT inhaler Inhale 2 puffs into the lungs every 6 (six) hours as needed for wheezing.  1 Inhaler  1  . nitroGLYCERIN (NITROSTAT) 0.4 MG SL tablet Place 1 tablet (0.4 mg total) under the tongue every 5 (five) minutes as needed.  25 tablet  12   No Known Allergies  ROS:  The patient denies anorexia, fever, headaches,  vision loss, ear pain, hoarseness, chest pain, palpitations, dizziness, syncope, cough, nausea, vomiting, diarrhea, constipation, abdominal pain, melena, hematochezia, hematuria, incontinence, dysuria, genital lesions, numbness, tingling, weakness, tremor, suspicious skin lesions, depression, anxiety, abnormal bleeding/bruising, or enlarged lymph nodes. Weight fluctuates 5-7 pounds up and down over a few day period, then loses it again (?related to sodium/diet intake). Sometimes his feet/ankles will swell. +hearing loss, wears hearing aides. Wife not mentioning apnea, + snoring.  Feels well rested in mornings.  Up twice a night to void.  Not in a sexual relationship with wife x 5 years, mainly due to her libido issues; trying to resume recently, and having some ED.  Pills haven't been effective.  Has injections, but due to his and his wife's weight, they are still having some difficulty with sexual relations, which prompted them to start Weight Watchers recently (wife doing, but he will end up eating better also).  PHYSICAL EXAM: BP 122/78  Pulse 72  Ht 5\' 7"  (1.702 m)  Wt 277 lb (125.646 kg)  BMI 43.38  kg/m2  General Appearance:    Alert, cooperative, no distress, appears stated age, obese  Head:    Normocephalic, without obvious abnormality, atraumatic  Eyes:    PERRL, conjunctiva/corneas clear, EOM's intact, fundi    benign  Ears:    Not examined.  Wearing hearing aides bilaterally  Nose:   Nares normal, mucosa normal, no drainage or sinus   tenderness  Throat:   Lips, mucosa, and tongue normal; teeth and gums normal  Neck:   Supple, no lymphadenopathy;  thyroid:  no   enlargement/tenderness/nodules; no carotid   bruit or JVD  Back:    Spine nontender, no curvature, ROM normal, no CVA     tenderness  Lungs:     Clear to auscultation bilaterally without wheezes, rales or     ronchi; respirations unlabored  Chest Wall:    No tenderness or deformity   Heart:    Regular rate and rhythm, S1 and S2 normal, no murmur, rub   or gallop.  Occasional ectopy noted (skipped and extra beats)  Breast Exam:    No chest wall tenderness, or masses   Abdomen:     Soft, non-tender, nondistended, normoactive bowel sounds,    no masses, no hepatosplenomegaly  Genitalia:   Deferred to urologist.  Rectal:    Deferred to urologist.  Extremities:   No clubbing or cyanosis, trace pretibeal edema  Pulses:   2+ and symmetric all extremities  Skin:   Skin turgor normal, no rashes or lesions; hyperpigmentation present R>L consistent with venous stasis changes.  AK's on scalp  Lymph nodes:   Cervical, supraclavicular, and axillary nodes normal  Neurologic:   CNII-XII intact, normal strength, sensation and gait; reflexes 2+ and symmetric throughout          Psych:   Normal mood, affect, hygiene and grooming.     ASSESSMENT/PLAN:  1. Routine general medical examination at a health care facility  POCT Urinalysis Dipstick, Visual acuity screening  2. GERD (gastroesophageal reflux disease)     controlled with medications.  Weight loss encouraged  3. OBESITY    4. Need for Tdap vaccination  Tdap vaccine greater  than or equal to 7yo IM  5. Impaired fasting glucose  Hemoglobin A1c, Comprehensive metabolic panel  6. Other malaise and fatigue  CBC with Differential, TSH  7. CAD (coronary artery disease)  CBC with Differential, Comprehensive metabolic panel  8. Actinic keratosis     on scalp.  Encouraged routine follow-up with his dermatologist, Dr. Jorja Loa   Low sodium diet info given. Increase exercise, decrease portions, weight loss.  Return for fasting labs--c-met, , cbc, TSH, A1c  Plans to get shingles vaccine at pharmacy (discussed last year) Tdap given today  Once labs reviewed (and found to be acceptable), forms will be returned to ortho stating that he is cleared medically, but pending cardiac clearance from Dr. Jens Som, pt's cardiologist.

## 2012-03-17 ENCOUNTER — Encounter: Payer: Self-pay | Admitting: Family Medicine

## 2012-03-20 ENCOUNTER — Other Ambulatory Visit: Payer: 59

## 2012-03-20 DIAGNOSIS — R5381 Other malaise: Secondary | ICD-10-CM

## 2012-03-20 DIAGNOSIS — R7301 Impaired fasting glucose: Secondary | ICD-10-CM

## 2012-03-20 DIAGNOSIS — I2510 Atherosclerotic heart disease of native coronary artery without angina pectoris: Secondary | ICD-10-CM

## 2012-03-20 LAB — CBC WITH DIFFERENTIAL/PLATELET
Basophils Absolute: 0 10*3/uL (ref 0.0–0.1)
Eosinophils Absolute: 0.3 10*3/uL (ref 0.0–0.7)
Eosinophils Relative: 4 % (ref 0–5)
HCT: 41 % (ref 39.0–52.0)
Lymphocytes Relative: 20 % (ref 12–46)
MCH: 29.1 pg (ref 26.0–34.0)
MCHC: 33.4 g/dL (ref 30.0–36.0)
MCV: 87 fL (ref 78.0–100.0)
Monocytes Absolute: 0.7 10*3/uL (ref 0.1–1.0)
RDW: 15.6 % — ABNORMAL HIGH (ref 11.5–15.5)
WBC: 6.3 10*3/uL (ref 4.0–10.5)

## 2012-03-20 LAB — COMPREHENSIVE METABOLIC PANEL
Albumin: 3.9 g/dL (ref 3.5–5.2)
CO2: 30 mEq/L (ref 19–32)
Glucose, Bld: 100 mg/dL — ABNORMAL HIGH (ref 70–99)
Potassium: 4.4 mEq/L (ref 3.5–5.3)
Sodium: 139 mEq/L (ref 135–145)
Total Bilirubin: 0.7 mg/dL (ref 0.3–1.2)
Total Protein: 6.2 g/dL (ref 6.0–8.3)

## 2012-03-20 LAB — TSH: TSH: 1.666 u[IU]/mL (ref 0.350–4.500)

## 2012-05-08 ENCOUNTER — Other Ambulatory Visit: Payer: Self-pay | Admitting: Cardiology

## 2012-05-11 ENCOUNTER — Other Ambulatory Visit: Payer: Self-pay | Admitting: Orthopedic Surgery

## 2012-05-11 MED ORDER — DEXAMETHASONE SODIUM PHOSPHATE 10 MG/ML IJ SOLN: 10.0000 mg | Freq: Once | INTRAMUSCULAR | Status: DC

## 2012-05-11 MED ORDER — BUPIVACAINE LIPOSOME 1.3 % IJ SUSP: 20.0000 mL | Freq: Once | INTRAMUSCULAR | Status: DC

## 2012-05-11 NOTE — Progress Notes (Signed)
Preoperative surgical orders have been place into the Epic hospital system for Jeffrey Wiggins on 05/11/2012, 8:34 AM  by Patrica Duel for surgery on 06/10/2012.  Preop Total Hip orders including Experel Injecion, IV Tylenol, and IV Decadron as long as there are no contraindications to the above medications. [name], PA-C

## 2012-05-26 ENCOUNTER — Encounter (HOSPITAL_COMMUNITY): Payer: Self-pay | Admitting: Pharmacy Technician

## 2012-05-29 ENCOUNTER — Encounter (HOSPITAL_COMMUNITY): Payer: Self-pay

## 2012-05-29 ENCOUNTER — Encounter (HOSPITAL_COMMUNITY)
Admission: RE | Admit: 2012-05-29 | Discharge: 2012-05-29 | Disposition: A | Discharge status: Home / Self Care | Payer: 59 | Source: Ambulatory Visit | Attending: Orthopedic Surgery | Admitting: Orthopedic Surgery

## 2012-05-29 ENCOUNTER — Ambulatory Visit (HOSPITAL_COMMUNITY)
Admission: RE | Admit: 2012-05-29 | Discharge: 2012-05-29 | Disposition: A | Discharge status: Home / Self Care | Payer: 59 | Source: Ambulatory Visit | Attending: Orthopedic Surgery | Admitting: Orthopedic Surgery

## 2012-05-29 ENCOUNTER — Other Ambulatory Visit: Payer: Self-pay | Admitting: Orthopedic Surgery

## 2012-05-29 DIAGNOSIS — I10 Essential (primary) hypertension: Secondary | ICD-10-CM | POA: Insufficient documentation

## 2012-05-29 DIAGNOSIS — T84039A Mechanical loosening of unspecified internal prosthetic joint, initial encounter: Secondary | ICD-10-CM | POA: Insufficient documentation

## 2012-05-29 DIAGNOSIS — I2510 Atherosclerotic heart disease of native coronary artery without angina pectoris: Secondary | ICD-10-CM | POA: Insufficient documentation

## 2012-05-29 DIAGNOSIS — Y831 Surgical operation with implant of artificial internal device as the cause of abnormal reaction of the patient, or of later complication, without mention of misadventure at the time of the procedure: Secondary | ICD-10-CM | POA: Insufficient documentation

## 2012-05-29 DIAGNOSIS — Z96659 Presence of unspecified artificial knee joint: Secondary | ICD-10-CM | POA: Insufficient documentation

## 2012-05-29 DIAGNOSIS — Z01812 Encounter for preprocedural laboratory examination: Secondary | ICD-10-CM | POA: Insufficient documentation

## 2012-05-29 HISTORY — DX: Unspecified hearing loss, bilateral: H91.93

## 2012-05-29 HISTORY — DX: Peripheral vascular disease, unspecified: I73.9

## 2012-05-29 LAB — URINALYSIS, ROUTINE W REFLEX MICROSCOPIC
Bilirubin Urine: NEGATIVE
Hgb urine dipstick: NEGATIVE
Nitrite: NEGATIVE
Specific Gravity, Urine: 1.012 (ref 1.005–1.030)
pH: 8 (ref 5.0–8.0)

## 2012-05-29 LAB — COMPREHENSIVE METABOLIC PANEL
AST: 36 U/L (ref 0–37)
Albumin: 4 g/dL (ref 3.5–5.2)
Alkaline Phosphatase: 46 U/L (ref 39–117)
Chloride: 100 mEq/L (ref 96–112)
Potassium: 3.8 mEq/L (ref 3.5–5.1)
Total Bilirubin: 0.7 mg/dL (ref 0.3–1.2)

## 2012-05-29 LAB — CBC
Platelets: 250 10*3/uL (ref 150–400)
RDW: 13.9 % (ref 11.5–15.5)
WBC: 7 10*3/uL (ref 4.0–10.5)

## 2012-05-29 LAB — SURGICAL PCR SCREEN: MRSA, PCR: NEGATIVE

## 2012-05-29 LAB — APTT: aPTT: 35 seconds (ref 24–37)

## 2012-05-29 NOTE — Pre-Procedure Instructions (Signed)
PREOP CBC, CMET, PT, PTT, UA, CXR WERE DONE TODAY AT [facility] AS PER ORDERS DR. Lequita Halt AND ANESTHESIOLOGIST'S GUIDELINES.  T/S WILL BE DONE DAY OF SURGERY. PT HAS EKG REPORT AND CARDIOLOGY OFFICE NOTE IN EPIC FROM DR. CRENSHAW.  NUCLEAR STRESS TEST REPORT  06/20/11, CAROTID STUDEY 6/14/113 ARE IN EPIC. PT BROUGHT ENVELOPE FROM DR. ALUISIO'S OFFICE WITH HIS H&P AND MEDICAL CLEARANCE FROM DR. KNAPP AND CARDIAC CLEARANCE FROM DR. CRENSHAW. PREOP INSTRUCTIONS DISCUSSED WITH PT USING TEACH BACK METHOD.

## 2012-05-29 NOTE — Patient Instructions (Addendum)
YOUR SURGERY IS SCHEDULED ON:   WED  9/25  AT 7:15 AM  REPORT TO [facility] AT:  5:15 AM      PHONE # FOR SHORT STAY IS [phone]  DO NOT EAT OR DRINK ANYTHING AFTER MIDNIGHT THE NIGHT BEFORE YOUR SURGERY.  YOU MAY BRUSH YOUR TEETH, RINSE OUT YOUR MOUTH--BUT NO WATER, NO FOOD, NO CHEWING GUM, NO MINTS, NO CANDIES, NO CHEWING TOBACCO.  PLEASE TAKE THE FOLLOWING MEDICATIONS THE AM OF YOUR SURGERY WITH A FEW SIPS OF WATER:   FLONASE AND PRILOSEC    DO NOT BRING VALUABLES, MONEY, CREDIT CARDS.  CONTACT LENS, DENTURES / PARTIALS, GLASSES SHOULD NOT BE WORN TO SURGERY AND IN MOST CASES-HEARING AIDS WILL NEED TO BE REMOVED.  BRING YOUR GLASSES CASE, ANY EQUIPMENT NEEDED FOR YOUR CONTACT LENS. FOR PATIENTS ADMITTED TO THE HOSPITAL--CHECK OUT TIME THE DAY OF DISCHARGE IS 11:00 AM.  ALL INPATIENT ROOMS ARE PRIVATE - WITH BATHROOM, TELEPHONE, TELEVISION AND WIFI INTERNET. IF YOU ARE BEING DISCHARGED THE SAME DAY OF YOUR SURGERY--YOU CAN NOT DRIVE YOURSELF HOME--AND SHOULD NOT GO HOME ALONE BY TAXI OR BUS.  NO DRIVING OR OPERATING MACHINERY FOR 24 HOURS FOLLOWING ANESTHESIA / PAIN MEDICATIONS.                            SPECIAL INSTRUCTIONS:  CHLORHEXIDINE SOAP SHOWER (other brand names are Betasept and Hibiclens ) PLEASE SHOWER WITH CHLORHEXIDINE THE NIGHT BEFORE YOUR SURGERY AND THE AM OF YOUR SURGERY. DO NOT USE CHLORHEXIDINE ON YOUR FACE OR PRIVATE AREAS--YOU MAY USE YOUR NORMAL SOAP THOSE AREAS AND YOUR NORMAL SHAMPOO.  WOMEN SHOULD AVOID SHAVING UNDER ARMS AND SHAVING LEGS 48 HOURS BEFORE USING CHLORHEXIDINE TO AVOID SKIN IRRITATION.  DO NOT USE IF ALLERGIC TO CHLORHEXIDINE.  PLEASE READ OVER ANY  FACT SHEETS THAT YOU WERE GIVEN: MRSA INFORMATION, BLOOD TRANSFUSION INFORMATION, INCENTIVE SPIROMETER INFORMATION.

## 2012-05-31 IMAGING — CR DG CHEST 2V
2 series · 2 of 2 positions shown · non-contrast
Comparison: Chest x-ray of 10/25/2008

CLINICAL DATA: Preop for right knee surgery, former smoker

CHEST - 2 VIEW

[w chest pa]
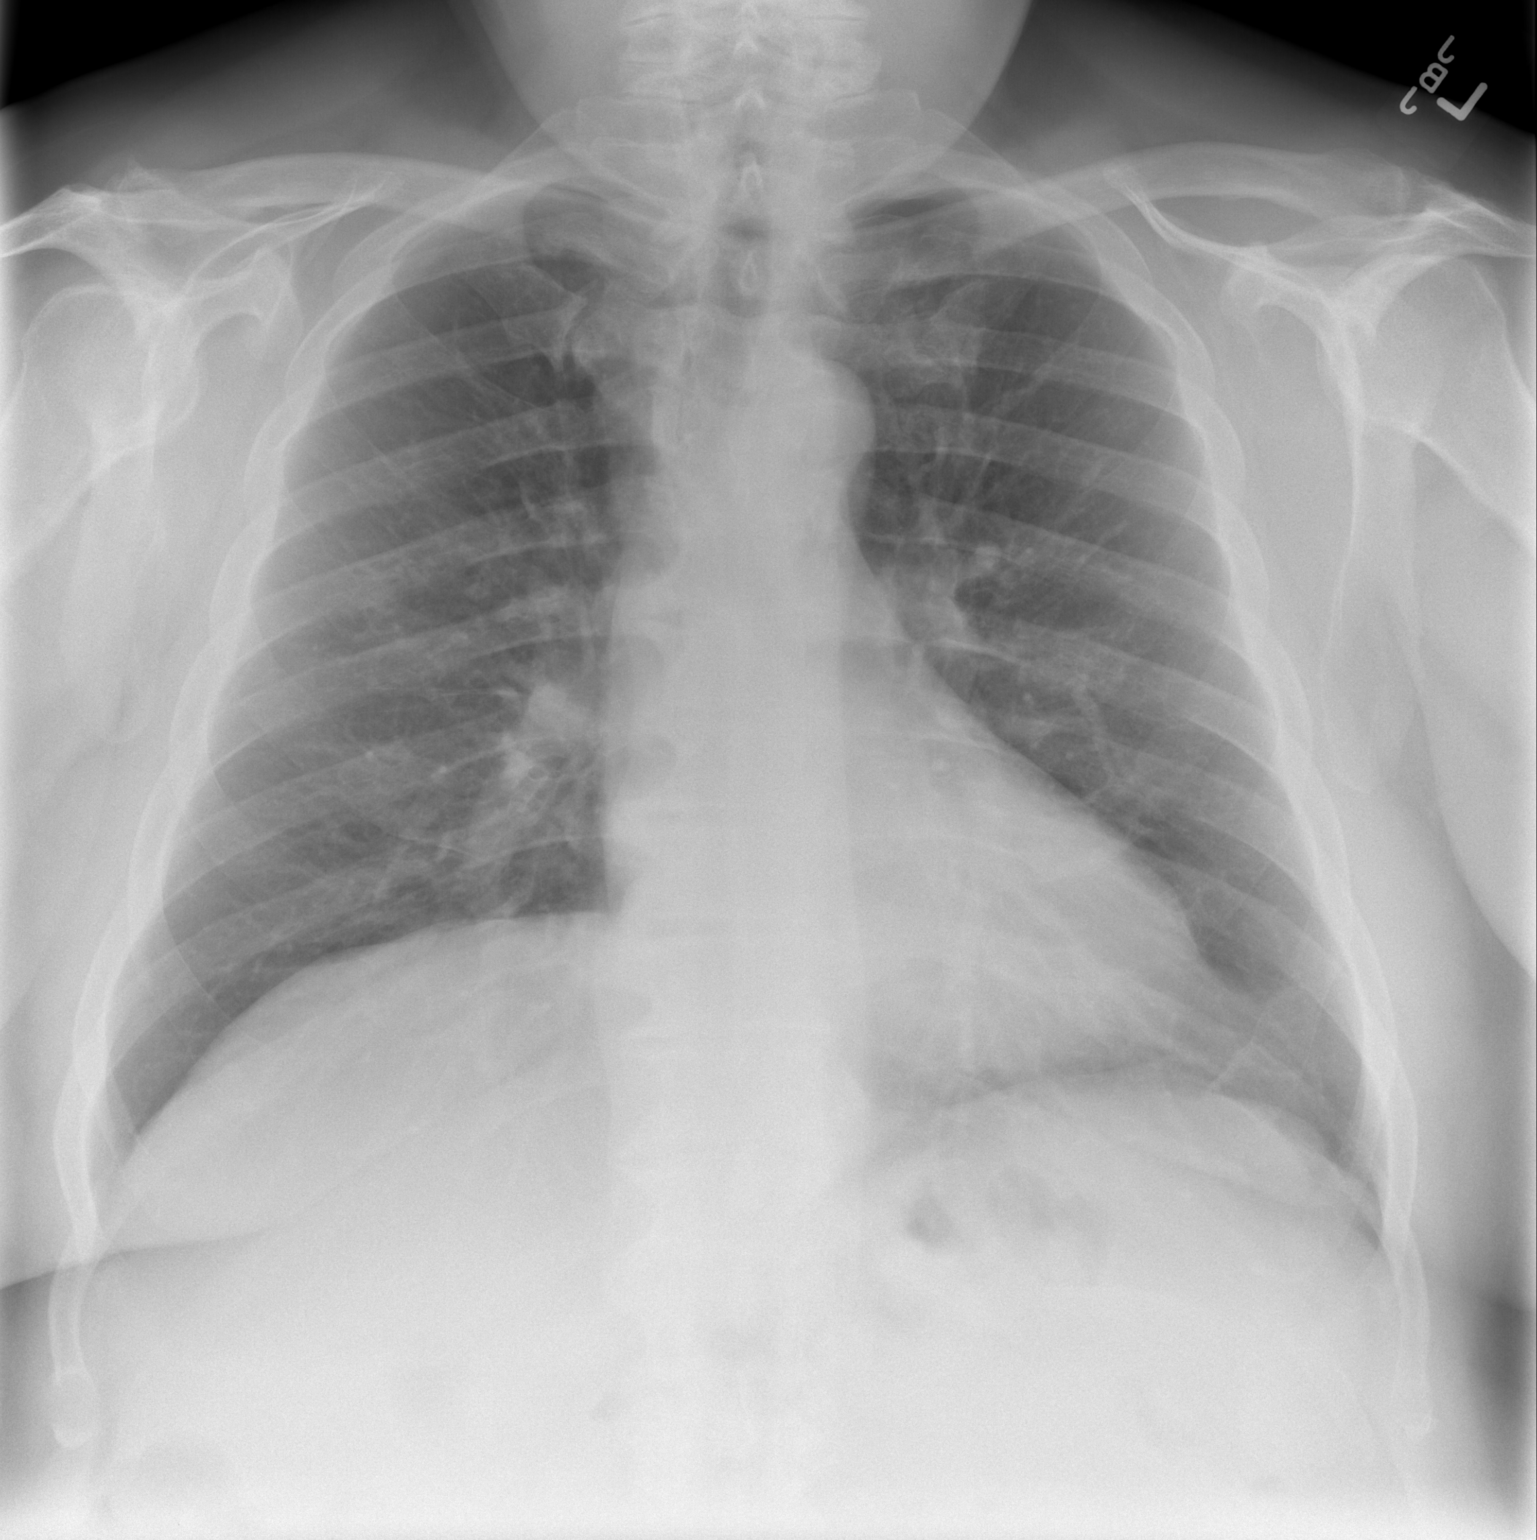

[w chest lat]
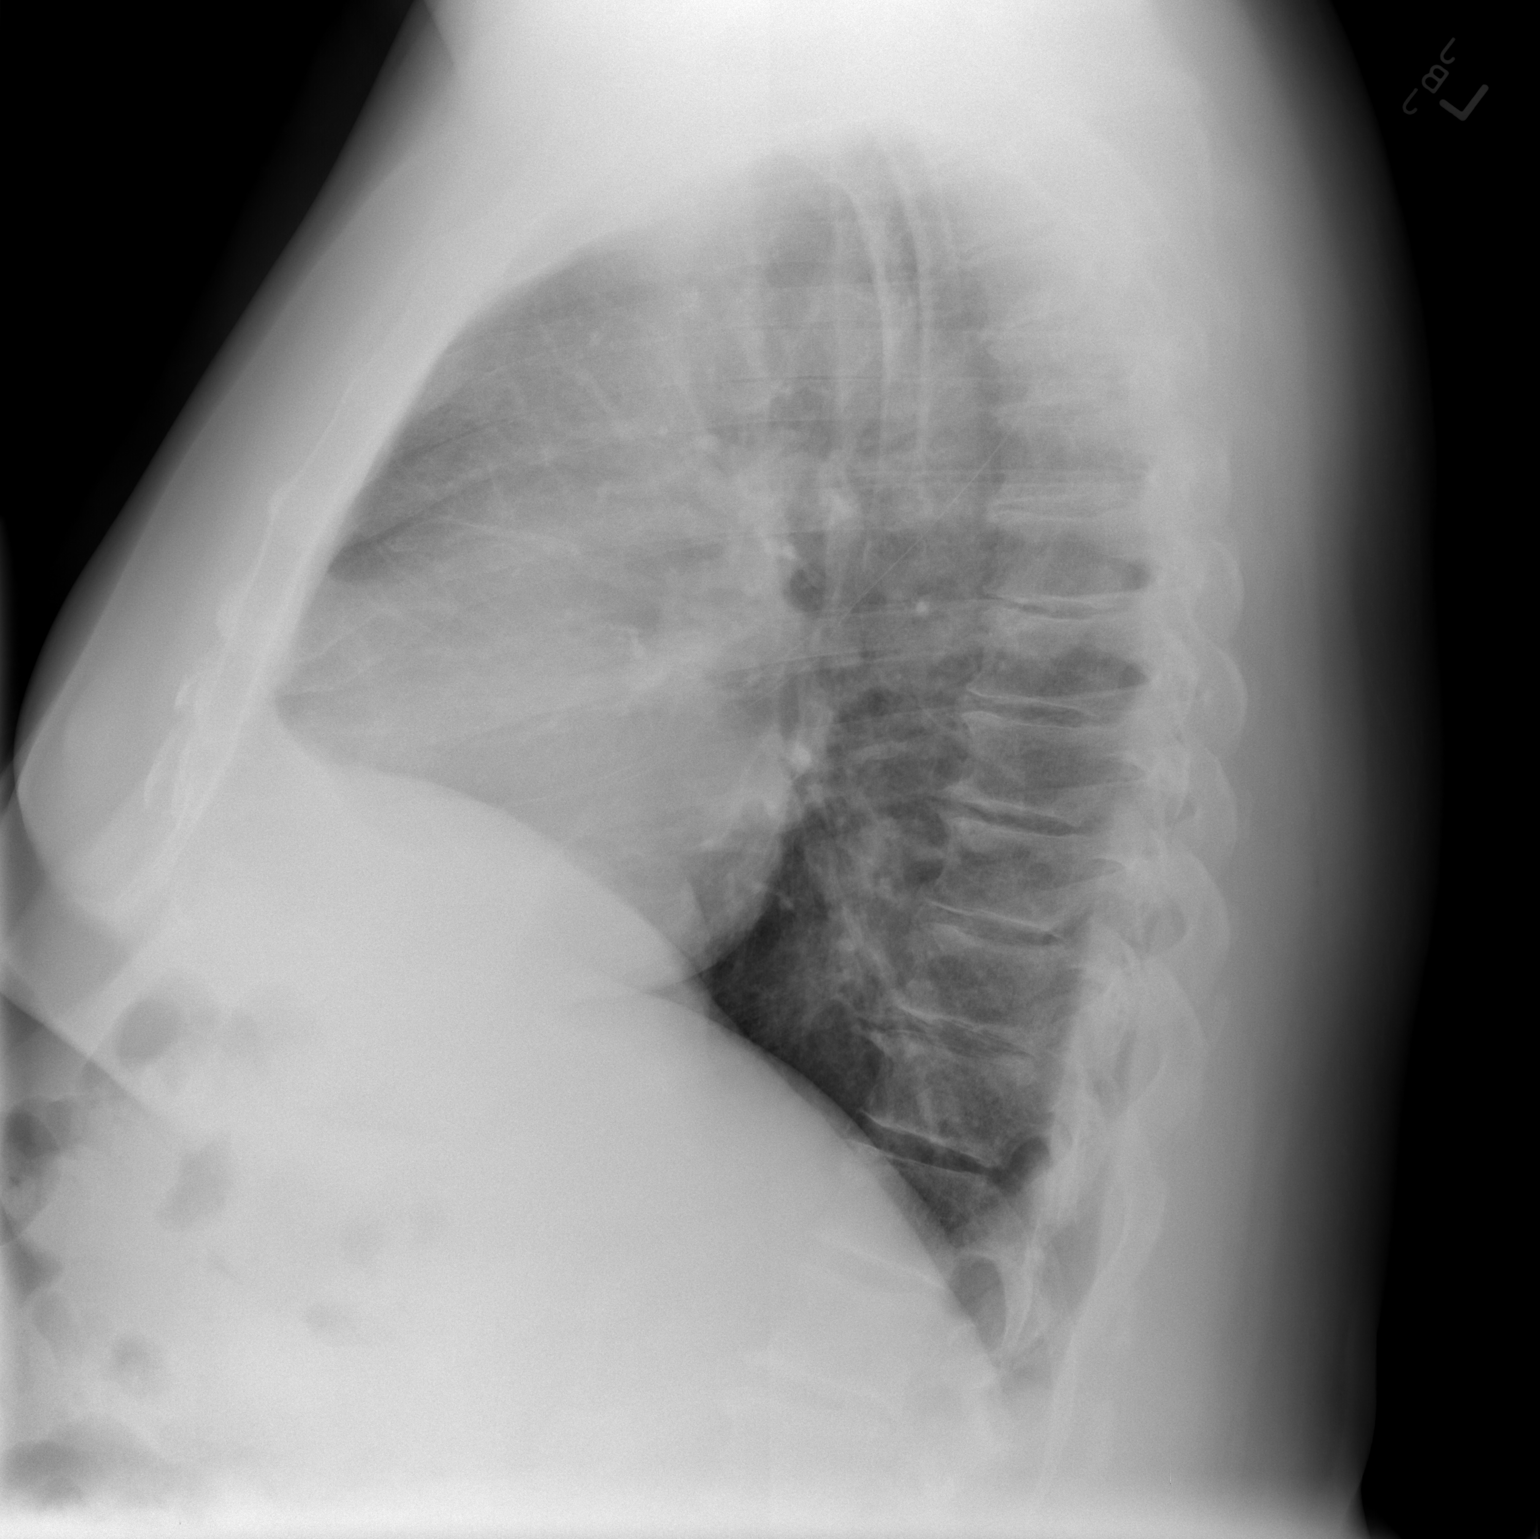

[2 of 2 positions shown; findings below may reference images not displayed]

FINDINGS: The lungs remain clear.  Mediastinal contours are stable.
The heart is mildly enlarged.  No acute bony abnormality is seen.
IMPRESSION: Stable chest x-ray.  No active lung disease.

## 2012-06-07 ENCOUNTER — Other Ambulatory Visit: Payer: Self-pay | Admitting: Orthopedic Surgery

## 2012-06-07 NOTE — H&P (Signed)
Jeffrey Wiggins  DOB: 04-30-1941 Married / Language: English / Race: White Male  Date of Admission:  06/10/2012  Chief Complaint:  Right Knee Pain  History of Present Illness The patient is a 71 year old male who comes in today for a preoperative History and Physical. The patient is scheduled for a right total knee revision to be performed by Dr. Gus Rankin. Aluisio, MD at [facility] on 06/10/2012.  The patient states that the right knee is painful in addition to being stiff. The left knee is doing fantastic. The patient states he has worked very hard with a trainer and despite that is not getting much more than 95 degrees of bend. About one year ago we did an arthrotomy and scar excision. Initially, the patient did well but then started losing motion again. In addition, he has had some pain in his left hip. It is all along the lateral aspect of the hip. He is not having any groin pain. He is not having any lower extremity weakness or paresthesias with this. A bone scan was ordered. The bone scan did show apparent loosening of the right knee. He said he still has pain and stiffness. He is not getting any swelling. He does have increased uptake especially around the tibial component. Posterior femur also has some increased uptake. Painful right total knee. I do feel he has tibial loosening. He has a significant amount of stiffness which is increasing the stress on the components. He would like to procedd with revision surgery now. They have been treated conservatively in the past for the above stated problem and despite conservative measures, they continue to have progressive pain and severe functional limitations and dysfunction. They have failed non-operative management. It is felt that they would benefit from undergoing total joint revision. Risks and benefits of the procedure have been discussed with the patient and they elect to proceed with surgery. There are no active  contraindications to surgery such as ongoing infection or rapidly progressive neurological disease.   Problem List/Past Medical Mechanical complication of internal orthopedic implant (996.49)   Allergies No Known Drug Allergies   Family History Heart Disease. father, grandmother fathers side and grandfather fathers side Heart disease in male family member before age 68 Congestive Heart Failure. father   Social History Illicit drug use. no Living situation. live with spouse Marital status. married Exercise. Exercises weekly; does gym / weights, Exercises daily; does gym / weights Children.4 Current work status. working part time Drug/Alcohol Rehab (Currently). 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 Former smoker. former smoker; smoke(d) 3 or more pack(s) per day; uses less than half 1/2 can(s) smokeless per week, former smoker; smoke(d) 1 pack(s) per day Tobacco / smoke exposure. yes outdoors only,no Number of flights of stairs before winded. 2-3 Pain Contract. no Previously in rehab. no Alcohol use. Occasional alcohol use. current drinker; drinks beer, wine and hard liquor; 5-7 per week Drug/Alcohol Rehab (Previously). no Current occupation. CPA Post-Surgical Plans. Plan is to go home.   Medication History Aspirin EC (81MG  Tablet DR, Oral) Active. Hydrochlorothiazide (12.5MG  Tablet, Oral) Active. Losartan Potassium (100MG  Tablet, Oral) Active. Atorvastatin Calcium (80MG  Tablet, Oral) Active. Fluticasone Propionate (50MCG/ACT Suspension, Nasal) Active. Claritin (10MG  Tablet, Oral) Active. Nitrostat (0.4MG  Tab Sublingual, Sublingual) Active. Cholesterol Study Medication: Anacetrapib or Placebo Active.   Past Surgical History Total Knee Replacement. bilateral; Left - 2006, Right - 2008 Vasectomy. Date: 76. Tonsillectomy. Date: 8. Heart Stents. 1998, 2009 Other Surgery. Date:  2011. Knee surgery  to remove excess scar tissue Prostatectomy; Transurethral. Date: 2011.   Medical History Hypercholesterolemia Myocardial infarction. 01/1997; 05/2008 Prostate Disease. BPH High blood pressure Coronary artery disease Gastroesophageal Reflux Disease Measles Mumps Impaired Memory Bronchitis Stent. Stents X 2 Hemorrhoids   Review of Systems General:Not Present- Chills, Fever, Night Sweats, Appetite Loss, Fatigue, Feeling sick, Weight Gain and Weight Loss. Skin:Not Present- Itching, Rash, Skin Color Changes, Ulcer, Psoriasis and Change in Hair or Nails. HEENT:Not Present- Sensitivity to light, Hearing problems, Nose Bleed and Ringing in the Ears. Neck:Not Present- Swollen Glands and Neck Mass. Respiratory:Not Present- Snoring, Chronic Cough, Bloody sputum and Dyspnea. Cardiovascular:Present- Leg Cramps and Palpitations. Not Present- Shortness of Breath, Chest Pain and Swelling of Extremities. Gastrointestinal:Not Present- Bloody Stool, Heartburn, Abdominal Pain, Vomiting, Nausea and Incontinence of Stool. Male Genitourinary:Present- Frequency. Not Present- Blood in Urine, Incontinence and Nocturia. Musculoskeletal:Present- Joint Stiffness, Joint Swelling, Joint Pain and Back Pain. Not Present- Muscle Weakness and Muscle Pain. Neurological:Not Present- Tingling, Numbness, Burning, Tremor, Headaches and Dizziness. Psychiatric:Not Present- Anxiety, Depression and Memory Loss. Endocrine:Not Present- Cold Intolerance, Heat Intolerance, Excessive hunger and Excessive Thirst. Hematology:Not Present- Abnormal Bleeding, Anemia, Blood Clots and Easy Bruising.   Vitals Weight: 275 lb Height: 67 in Body Surface Area: 2.43 m Body Mass Index: 43.07 kg/m Pulse: 72 (Regular) Resp.: 14 (Unlabored) BP: 138/88 (Sitting, Left Arm, Standard)    Physical Exam The physical exam findings are as follows:  Note: Patient is a 71 year old male with continued knee  pain.   General Mental Status - Alert, cooperative and good historian. General Appearance- pleasant. Not in acute distress. Orientation- Oriented X3. Build & Nutrition- Well nourished and Well developed.   Head and Neck Head- normocephalic, atraumatic . Neck Global Assessment- supple. no bruit auscultated on the right and no bruit auscultated on the left.   Eye Pupil- Bilateral- Regular and Round. Note: wears glasses Motion- Bilateral- EOMI.   Chest and Lung Exam Auscultation: Breath sounds:- clear at anterior chest wall and - clear at posterior chest wall. Adventitious sounds:- No Adventitious sounds.   Cardiovascular Auscultation:Rhythm- Regular rate and rhythm. Heart Sounds- S1 WNL and S2 WNL. Murmurs & Other Heart Sounds: Murmur 1:Location- Aortic Area and Pulmonic Area. Timing- Mid-systolic. Grade- II/VI.   Abdomen Palpation/Percussion:Tenderness- Abdomen is non-tender to palpation. Rigidity (guarding)- Abdomen is soft. Auscultation:Auscultation of the abdomen reveals - Bowel sounds normal.   Male Genitourinary Not done, not pertinent to present illness  Musculoskeletal  On exam, he is alert and oriented in no apparent distress. His right knee shows no effusion. His range is about 0 to 90 or 95 degrees. No instability noted.  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 Lequita Halt.  Plan is to go home.  PCP - Dr. Lynelle Doctor - Pateint has been seen preoperatively and felt to be stable for surgery. Cards - Dr. Jens Som - Patient has been seen preoperatively and felt to be stable for upcoming surgery. Urology - Dr. Imelda Pillow  Signed electronically by Roberts Gaudy, PA-C

## 2012-07-01 ENCOUNTER — Encounter: Payer: Self-pay | Admitting: Internal Medicine

## 2012-07-14 ENCOUNTER — Other Ambulatory Visit: Payer: Self-pay | Admitting: Cardiology

## 2012-07-15 ENCOUNTER — Ambulatory Visit: Payer: 59 | Admitting: Family Medicine

## 2012-07-22 ENCOUNTER — Ambulatory Visit (INDEPENDENT_AMBULATORY_CARE_PROVIDER_SITE_OTHER): Payer: 59 | Admitting: Family Medicine

## 2012-07-22 ENCOUNTER — Encounter: Payer: Self-pay | Admitting: Family Medicine

## 2012-07-22 VITALS — BP 138/78 | HR 72 | Ht 67.0 in | Wt 278.0 lb

## 2012-07-22 DIAGNOSIS — R7309 Other abnormal glucose: Secondary | ICD-10-CM

## 2012-07-22 DIAGNOSIS — E669 Obesity, unspecified: Secondary | ICD-10-CM

## 2012-07-22 DIAGNOSIS — R7301 Impaired fasting glucose: Secondary | ICD-10-CM | POA: Insufficient documentation

## 2012-07-22 NOTE — Progress Notes (Signed)
Chief Complaint  Patient presents with  . Follow-up    3 month follow up. 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  He notes he feels shaky about 2 hours after eating carbs (cereal, waffle) unless he has protein along with it.  Hasn't checked BP's since the last time he was here, when they had been running 120's/70's  R TKR scheduled for January 2014 (was supposed to have in September but delayed due to his mother being on hospice, subsequently passed away).  Has knee pain, but has been exercising.  Past Medical History  Diagnosis Date  . CAD (coronary artery disease)   . HTN (hypertension)   . Prostatitis   . Hyperlipidemia   . Obesity   . Diverticular disease   . Gallstones   . Hepatitis age 71  . Arthritis   . Sleep apnea     mild, CPAP wasn't indicated (per pt)  . Benign prostatic hypertrophy     followed by Dr. Patsi Sears  . Cerebrovascular disease   . Sepsis due to Escherichia coli ? 2011    DUE TO URINARY OUTLET OBSTRUCTION /INFECTION  . UTI (urinary tract infection)     secondary  . Allergy     RHINITIS  . 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 Jens Som); s/p stents  . GERD (gastroesophageal reflux disease)   . Hypogonadism male     treated by Dr. Patsi Sears  . Erectile dysfunction   . Peripheral vascular disease     02/28/12 MOST RECENT CAROTID DUPLEX--"STABLE MILD CALCIFIED PLAQUE, BILATERALY, STABLE, OVER SERIAL EXAMS"  . 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 IFG (impaired fasting glucose)    Past Surgical History  Procedure Date  . 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 Vasectomy   . Tonsillectomy   . Transurethral resection of prostate 06/2010  . Knee  surgery 05/2010    removing scar tissue R knee (Dr. Lequita Halt)  . Coronary angioplasty with stent placement 01/1997    PTCA/stent to RCA  AND 2009 2ND STENT PLACED   History   Social History  . Marital Status: Married    Spouse Name: N/A    Number of Children: 4  . Years of Education: N/A   Occupational History  . CPA    Social History Main Topics  . Smoking status: Former Games developer  . Smokeless tobacco: Never Used     Comment: 35 pack year history, none in 18 years  . Alcohol Use: Yes     Comment: 1 glass of wine per day  . Drug Use: No  . Sexually Active: Not on file   Other Topics Concern  . Not on file   Social History Narrative  . No narrative on file   Family History  Problem Relation Age of Onset  . Coronary artery disease Mother     deceased  . Dementia Mother   . Atrial fibrillation Mother   . Stroke Mother     related to atrial fib  . Coronary artery disease Father     deceased  . Hypertension Father   . Heart disease Father   .  Cancer Paternal Uncle   . Hypertension Paternal Grandmother   . Heart disease Paternal Grandmother   . Hypertension Paternal Grandfather   . Heart disease Paternal Grandfather    Current outpatient prescriptions:acetaminophen (TYLENOL) 500 MG tablet, Take 1,000 mg by mouth as needed. Pain, Disp: , Rfl: ;  aspirin 81 MG tablet, Take 81 mg by mouth daily. , Disp: , Rfl: ;  atorvastatin (LIPITOR) 80 MG tablet, Take 80 mg by mouth every evening., Disp: , Rfl: ;  CALCIUM-MAGNESIUM-ZINC PO, Take 1 tablet by mouth daily., Disp: , Rfl: ;  Cholecalciferol (VITAMIN D) 2000 UNITS tablet, Take 2,000 Units by mouth daily., Disp: , Rfl:  Coenzyme Q-10 100 MG capsule, Take 100 mg by mouth daily., Disp: , Rfl: ;  fluticasone (FLONASE) 50 MCG/ACT nasal spray, USE 2 SPRAYS IN EACH NOSTRIL DAILY, Disp: 16 g, Rfl: 2;  hydrochlorothiazide (HYDRODIURIL) 25 MG tablet, Take 25 mg by mouth daily with breakfast., Disp: , Rfl: ;  loratadine (CLARITIN) 10 MG tablet,  Take 10 mg by mouth daily. , Disp: , Rfl: ;  losartan (COZAAR) 100 MG tablet, TAKE 1 TABLET DAILY, Disp: 90 tablet, Rfl: 3 Multiple Vitamin (MULTIVITAMIN) capsule, Take 1 capsule by mouth daily. , Disp: , Rfl: ;  NON FORMULARY, Take 1 tablet by mouth every evening. Study medication Anacetrapib, Disp: , Rfl: ;  omeprazole (PRILOSEC OTC) 20 MG tablet, Take 1 tablet (20 mg total) by mouth daily., Disp: , Rfl: ;  Testosterone (AXIRON TD), Place 1 application onto the skin daily. , Disp: , Rfl: ;  vitamin E 400 UNIT capsule, Take 400 Units by mouth daily., Disp: , Rfl:  nitroGLYCERIN (NITROSTAT) 0.4 MG SL tablet, Place 1 tablet (0.4 mg total) under the tongue every 5 (five) minutes as needed., Disp: 25 tablet, Rfl: 12;  RESVERATROL PO, Take 500 mg by mouth daily., Disp: , Rfl:  (not taking reservatrol or nitro)  ROS:  Denies headaches, dizziness, chest pain, palpitations, edema, nausea, vomiting, GI complaints, skin rashes, polydisia, polyuria, depression or other concerns.  + knee pain.  PHYSICAL EXAM: BP 138/78  Pulse 72  Ht 5\' 7"  (1.702 m)  Wt 278 lb (126.1 kg)  BMI 43.54 kg/m2 Well developed, pleasant male, in no distress Remainder of visit was limited to counseling.  Lab Results  Component Value Date   HGBA1C 6.3 07/22/2012   ASSESSMENT/PLAN:  1. Impaired fasting glucose    2. Elevated glucose  HgB A1c  3. OBESITY     HTN--BP elevated today.  Reviewed low sodium diet, daily exercise, weight loss, and reminded to periodically check his BP elsewhere.  25 min OV--all counseling re: specifics of diet (low carb, lower sugar intake, portion control, low cholesterol, low sodium).  Discussed possible options to help with weight loss including Weight Watchers (his wife really isn't doing now) vs MyFitnessPal vs journaling vs others.  Reviewed need for at least 30 minutes of exercise daily, which types of exercises wouldn't bother his knee.  F/u 3 months  Check with insurance regarding  Zostavax--if covered, can schedule NV (he isn't Medicare)

## 2012-07-22 NOTE — Patient Instructions (Signed)
Try and exercise at least 30 minutes daily. Today we discussed low carb diet, eating 5-7 servings of fruits/vegetables daily, healthy snacks, weight loss, low sodium and low cholesterol diet. Consider using MyFitnessPal vs Weight Watchers If you are interested in meeting with a nutritionist, let me know.  Check again with insurance regarding Zostavax.  If they cover it, we can give it here, since you do not have Medicare.

## 2012-07-30 ENCOUNTER — Other Ambulatory Visit: Payer: Self-pay | Admitting: Orthopedic Surgery

## 2012-07-30 MED ORDER — BUPIVACAINE 0.25 % ON-Q PUMP SINGLE CATH 300ML: 300.0000 mL | INJECTION | Status: DC

## 2012-07-30 MED ORDER — DEXAMETHASONE SODIUM PHOSPHATE 10 MG/ML IJ SOLN: 10.0000 mg | Freq: Once | INTRAMUSCULAR | Status: DC

## 2012-07-30 NOTE — Progress Notes (Signed)
Preoperative surgical orders have been place into the [facility] hospital system for Jeffrey Wiggins on 07/30/2012, 11:18 AM  by Patrica Duel for surgery on 09/23/12.  Preop Total Knee orders including Bupivacaine On-Q pump, IV Tylenol, and IV Decadron as long as there are no contraindications to the above medications. [name], PA-C

## 2012-08-09 ENCOUNTER — Other Ambulatory Visit: Payer: Self-pay | Admitting: Cardiology

## 2012-09-11 ENCOUNTER — Encounter (HOSPITAL_COMMUNITY): Payer: Self-pay

## 2012-09-11 ENCOUNTER — Encounter (HOSPITAL_COMMUNITY): Payer: Self-pay | Admitting: Pharmacy Technician

## 2012-09-11 ENCOUNTER — Encounter (HOSPITAL_COMMUNITY)
Admission: RE | Admit: 2012-09-11 | Discharge: 2012-09-11 | Disposition: A | Discharge status: Home / Self Care | Payer: 59 | Source: Ambulatory Visit | Attending: Orthopedic Surgery | Admitting: Orthopedic Surgery

## 2012-09-11 HISTORY — DX: Pneumonia, unspecified organism: J18.9

## 2012-09-11 LAB — COMPREHENSIVE METABOLIC PANEL
BUN: 15 mg/dL (ref 6–23)
Calcium: 9.2 mg/dL (ref 8.4–10.5)
GFR calc Af Amer: >90 mL/min (ref >90)
Glucose, Bld: 83 mg/dL (ref 70–99)
Total Protein: 6.8 g/dL (ref 6.0–8.3)

## 2012-09-11 LAB — URINALYSIS, ROUTINE W REFLEX MICROSCOPIC
Leukocytes, UA: NEGATIVE
Nitrite: NEGATIVE
Protein, ur: NEGATIVE mg/dL
Urobilinogen, UA: 1 mg/dL (ref 0.0–1.0)

## 2012-09-11 LAB — SURGICAL PCR SCREEN: MRSA, PCR: NEGATIVE

## 2012-09-11 LAB — CBC
HCT: 44.3 % (ref 39.0–52.0)
Hemoglobin: 14.2 g/dL (ref 13.0–17.0)
MCH: 28.8 pg (ref 26.0–34.0)
MCHC: 32.1 g/dL (ref 30.0–36.0)
RDW: 14.6 % (ref 11.5–15.5)

## 2012-09-11 LAB — PROTIME-INR
INR: 0.96 (ref 0.00–1.49)
Prothrombin Time: 12.7 seconds (ref 11.6–15.2)

## 2012-09-11 NOTE — Progress Notes (Signed)
Chest x-ray 05/29/12 on [facility]

## 2012-09-11 NOTE — Patient Instructions (Addendum)
20 DEMARIOUS KAPUR  09/11/2012   Your procedure is scheduled on: 09/23/12  Report to [facility] at 0900 AM.  Call this number if you have problems the morning of surgery [phone]: [phone]   Remember:   Do not eat food or drink liquids After Midnight.     Take these medicines the morning of surgery with A SIP OF WATER: claritin, prilosec   Do not wear jewelry, make-up or nail polish.  Do not wear lotions, powders, or perfumes. You may wear deodorant.  Do not shave 48 hours prior to surgery. Men may shave face and neck.  Do not bring valuables to the hospital.  Contacts, dentures or bridgework may not be worn into surgery.  Leave suitcase in the car. After surgery it may be brought to your room.  For patients admitted to the hospital, checkout time is 11:00 AM the day of discharge.      Please read over the following fact sheets that you were given: MRSA Information, blood fact sheet, incentive spirometer fact sheet Birdie Sons, RN  pre op nurse call if needed [phone]    FAILURE TO FOLLOW THESE INSTRUCTIONS MAY RESULT IN CANCELLATION OF YOUR SURGERY   Patient Signature: ___________________________________________

## 2012-09-22 ENCOUNTER — Other Ambulatory Visit: Payer: Self-pay | Admitting: Orthopedic Surgery

## 2012-09-22 NOTE — H&P (Signed)
Jeffrey Wiggins  DOB: 15-Aug-1941 Married / Language: English / Race: White Male  Date of Admission:  09/23/2012  Chief Complaint:  Right Knee Pain  History of Present Illness The patient is a 72 year old male who comes in for a preoperative History and Physical. The patient is scheduled for a right total knee arthroplasty (revision) to be performed by Dr. Gus Wiggins. Aluisio, MD at [name] on 09/23/2012. The patient states that the right knee is painful in addition to being stiff. The left knee is doing fantastic. The patient states he has worked very hard with a trainer and despite that is not getting much more than 95 degrees of bend. About one year ago we did an arthrotomy and scar excision. Initially, the patient did well but then started losing motion again. In addition, he has had some pain in his left hip. It is all along the lateral aspect of the hip. He is not having any groin pain. He is not having any lower extremity weakness or paresthesias with this. A bone scan was ordered. The bone scan did show apparent loosening of the right knee. He said he still has pain and stiffness. He is not getting any swelling. He does have increased uptake especially around the tibial component. Posterior femur also has some increased uptake. Painful right total knee. 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 He has a significant amount of stiffness which is increasing the stress on the components. He would like to procedd with revision surgery now. His surgery was originally scheduled for September of this year but his mother became very sick and he had to postpone his procedure and is now rescheduled. They have been treated conservatively in the past for the above stated problem and despite conservative measures, they continue to have progressive pain and severe functional limitations and dysfunction. They have failed non-operative management. It is felt that they would benefit from  undergoing total joint revision. Risks and benefits of the procedure have been discussed with the patient and they elect to proceed with surgery. There are no active contraindications to surgery such as ongoing infection or rapidly progressive neurological disease.   Problem List Mechanical complication of internal orthopedic implant (996.49)   Allergies No Known Drug Allergies   Family History Heart disease in male family member before age 79 Heart Disease. father, grandmother fathers side and grandfather fathers side Congestive Heart Failure. father   Social History Tobacco use. Former smoker. former smoker; smoke(d) 3 or more pack(s) per day; uses less than half 1/2 can(s) smokeless per week, former smoker; smoke(d) 1 pack(s) per day Tobacco / smoke exposure. yes outdoors only, no Post-Surgical Plans. Plan is to go home. 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 Occasional alcohol use. current drinker; drinks beer, wine and hard liquor; 5-7 per week Living situation. live with spouse Illicit drug use. no Number of flights of stairs before winded. 2-3 Marital status. married Exercise. Exercises weekly; does gym / weights, Exercises daily; does gym / weights Current work status. working part time Children. 5 or more, 4 Drug/Alcohol Rehab (Previously). no Drug/Alcohol Rehab (Currently). no   Past Surgical History Other Surgery. Date: 2011. Knee surgery to remove excess scar tissue Heart Stents. 1998, 2009 Prostatectomy; Transurethral. Date: 2011. Tonsillectomy. Date: 56. Total Knee Replacement. bilateral; Left - 2006, Right - 2008 Vasectomy. Date: 51.   Medical History Impaired Memory Prostate Disease. BPH  Bronchitis Hemorrhoids Stent. Stents X 2 Myocardial infarction. 01/1997; 05/2008 Mumps Measles High blood  pressure Coronary artery disease Gastroesophageal Reflux Disease Hypercholesterolemia Pneumonia Rubella   Review of Systems General:Not Present- Chills, Fever, Night Sweats, Appetite Loss, Fatigue, Feeling sick, Weight Gain and Weight Loss. Skin:Not Present- Itching, Rash, Skin Color Changes, Ulcer, Psoriasis and Change in Hair or Nails. HEENT:Not Present- Sensitivity to light, Hearing problems, Nose Bleed and Ringing in the Ears. Neck:Not Present- Swollen Glands and Neck Mass. Respiratory:Not Present- Snoring, Chronic Cough, Bloody sputum and Dyspnea. Cardiovascular:Present- Leg Cramps and Palpitations. Not Present- Shortness of Breath, Chest Pain and Swelling of Extremities. Gastrointestinal:Not Present- Bloody Stool, Heartburn, Abdominal Pain, Vomiting, Nausea and Incontinence of Stool. Male Genitourinary:Present- Frequency. Not Present- Blood in Urine, Incontinence and Nocturia. Musculoskeletal:Present- Joint Stiffness, Joint Swelling, Joint Pain and Back Pain. Not Present- Muscle Weakness and Muscle Pain. Neurological:Not Present- Tingling, Numbness, Burning, Tremor, Headaches and Dizziness. Psychiatric:Not Present- Anxiety, Depression and Memory Loss. Endocrine:Not Present- Cold Intolerance, Heat Intolerance, Excessive hunger and Excessive Thirst. Hematology:Not Present- Abnormal Bleeding, Anemia, Blood Clots and Easy Bruising.   Vitals Weight: 280 lb Height: 67 in Weight was reported by patient. Height was reported by patient. Body Surface Area: 2.45 m Body Mass Index: 43.85 kg/m Pulse: 88 (Regular) Resp.: 16 (Unlabored) BP: 146/72 (Sitting, Right Arm, Standard)  Physical Exam The physical exam findings are as follows:  Patient is a 72 year old male with continued knee pain.  General Mental Status - Alert, cooperative and good historian. General Appearance- pleasant. Not in acute distress. Orientation- Oriented X3. Build & Nutrition-  Well nourished and Well developed.   Head and Neck Head- normocephalic, atraumatic . Neck Global Assessment- supple. no bruit auscultated on the right and no bruit auscultated on the left.   Eye Vision- Wears corrective lenses. Pupil- Bilateral- Regular and Round. Note: wears glasses Motion- Bilateral- EOMI.   ENMT EarsNote: wears bilateral hearing aids   Chest and Lung Exam Auscultation: Breath sounds:- clear at anterior chest wall and - clear at posterior chest wall. Adventitious sounds:- No Adventitious sounds.   Cardiovascular Auscultation:Rhythm- Regular rate and rhythm. Heart Sounds- S1 WNL and S2 WNL. Murmurs & Other Heart Sounds: Murmur 1:Location- Aortic Area and Pulmonic Area. Timing- Mid-systolic. Grade- II/VI.   Abdomen Inspection:Contour- Generalized moderate distention. Palpation/Percussion:Tenderness- Abdomen is non-tender to palpation. Rigidity (guarding)- Abdomen is soft. Auscultation:Auscultation of the abdomen reveals - Bowel sounds normal.   Male Genitourinary Not done, not pertinent to present illness  Musculoskeletal On exam, he is alert and oriented in no apparent distress. His right knee shows no effusion. His range is about 0 to 90 or 95 degrees. No instability noted.  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 Lequita Halt.  Plan is to go home.  PCP - Dr. Joselyn Arrow Cards - Dr. Sondra Barges - Dr. Jethro Bolus  Signed electronically by Roberts Gaudy, PA-C

## 2012-09-23 ENCOUNTER — Encounter (HOSPITAL_COMMUNITY): Payer: Self-pay | Admitting: Anesthesiology

## 2012-09-23 ENCOUNTER — Encounter (HOSPITAL_COMMUNITY): Payer: Self-pay | Admitting: *Deleted

## 2012-09-23 ENCOUNTER — Encounter (HOSPITAL_COMMUNITY)
Admission: RE | Disposition: A | Discharge status: Home Health | Payer: Self-pay | Source: Ambulatory Visit | Attending: Orthopedic Surgery

## 2012-09-23 ENCOUNTER — Inpatient Hospital Stay (HOSPITAL_COMMUNITY): Payer: 59 | Admitting: Anesthesiology

## 2012-09-23 ENCOUNTER — Encounter (HOSPITAL_COMMUNITY): Payer: Self-pay | Admitting: Orthopedic Surgery

## 2012-09-23 ENCOUNTER — Inpatient Hospital Stay (HOSPITAL_COMMUNITY)
Admission: RE | Admit: 2012-09-23 | Discharge: 2012-09-25 | DRG: 467 | Disposition: A | Discharge status: Home Health | Payer: 59 | Source: Ambulatory Visit | Attending: Orthopedic Surgery | Admitting: Orthopedic Surgery

## 2012-09-23 DIAGNOSIS — Z01812 Encounter for preprocedural laboratory examination: Secondary | ICD-10-CM

## 2012-09-23 DIAGNOSIS — M1710 Unilateral primary osteoarthritis, unspecified knee: Secondary | ICD-10-CM | POA: Diagnosis present

## 2012-09-23 DIAGNOSIS — Y831 Surgical operation with implant of artificial internal device as the cause of abnormal reaction of the patient, or of later complication, without mention of misadventure at the time of the procedure: Secondary | ICD-10-CM | POA: Diagnosis present

## 2012-09-23 DIAGNOSIS — I2510 Atherosclerotic heart disease of native coronary artery without angina pectoris: Secondary | ICD-10-CM | POA: Diagnosis present

## 2012-09-23 DIAGNOSIS — Z9861 Coronary angioplasty status: Secondary | ICD-10-CM

## 2012-09-23 DIAGNOSIS — T84099A Other mechanical complication of unspecified internal joint prosthesis, initial encounter: Principal | ICD-10-CM | POA: Diagnosis present

## 2012-09-23 DIAGNOSIS — Z6841 Body Mass Index (BMI) 40.0 and over, adult: Secondary | ICD-10-CM

## 2012-09-23 DIAGNOSIS — D62 Acute posthemorrhagic anemia: Secondary | ICD-10-CM

## 2012-09-23 DIAGNOSIS — K219 Gastro-esophageal reflux disease without esophagitis: Secondary | ICD-10-CM | POA: Diagnosis present

## 2012-09-23 DIAGNOSIS — I10 Essential (primary) hypertension: Secondary | ICD-10-CM | POA: Diagnosis present

## 2012-09-23 DIAGNOSIS — Z96659 Presence of unspecified artificial knee joint: Secondary | ICD-10-CM

## 2012-09-23 DIAGNOSIS — I252 Old myocardial infarction: Secondary | ICD-10-CM

## 2012-09-23 DIAGNOSIS — G4733 Obstructive sleep apnea (adult) (pediatric): Secondary | ICD-10-CM | POA: Diagnosis present

## 2012-09-23 DIAGNOSIS — T84018A Broken internal joint prosthesis, other site, initial encounter: Secondary | ICD-10-CM

## 2012-09-23 HISTORY — PX: TOTAL KNEE REVISION: SHX996

## 2012-09-23 LAB — TYPE AND SCREEN

## 2012-09-23 SURGERY — TOTAL KNEE REVISION
Anesthesia: Spinal | Site: Knee | Laterality: Right | Wound class: Clean

## 2012-09-23 MED ORDER — SODIUM CHLORIDE 0.9 % IV SOLN: INTRAVENOUS | Status: DC

## 2012-09-23 MED ORDER — MORPHINE SULFATE 2 MG/ML IJ SOLN
1.0000 mg | INTRAMUSCULAR | Status: DC | PRN
  Administered 2012-09-23 (×2): 1 mg via INTRAVENOUS
  Filled 2012-09-23 (×2): qty 1

## 2012-09-23 MED ORDER — METOCLOPRAMIDE HCL 5 MG/ML IJ SOLN: 5.0000 mg | Freq: Three times a day (TID) | INTRAMUSCULAR | Status: DC | PRN

## 2012-09-23 MED ORDER — BISACODYL 10 MG RE SUPP: 10.0000 mg | Freq: Every day | RECTAL | Status: DC | PRN

## 2012-09-23 MED ORDER — ACETAMINOPHEN 325 MG PO TABS: 650.0000 mg | ORAL_TABLET | Freq: Four times a day (QID) | ORAL | Status: DC | PRN

## 2012-09-23 MED ORDER — TESTOSTERONE 30 MG/ACT TD SOLN: 1.0000 "application " | Freq: Every day | TRANSDERMAL | Status: DC

## 2012-09-23 MED ORDER — HYDROCHLOROTHIAZIDE 25 MG PO TABS
25.0000 mg | ORAL_TABLET | Freq: Every morning | ORAL | Status: DC
  Administered 2012-09-24 – 2012-09-25 (×2): 25 mg via ORAL
  Filled 2012-09-23 (×2): qty 1

## 2012-09-23 MED ORDER — LOSARTAN POTASSIUM 50 MG PO TABS
100.0000 mg | ORAL_TABLET | Freq: Every morning | ORAL | Status: DC
  Administered 2012-09-24 – 2012-09-25 (×2): 100 mg via ORAL
  Filled 2012-09-23 (×2): qty 2

## 2012-09-23 MED ORDER — BUPIVACAINE ON-Q PAIN PUMP (FOR ORDER SET NO CHG)
INJECTION | Status: DC
  Filled 2012-09-23: qty 1

## 2012-09-23 MED ORDER — FLEET ENEMA 7-19 GM/118ML RE ENEM: 1.0000 | ENEMA | Freq: Once | RECTAL | Status: AC | PRN

## 2012-09-23 MED ORDER — DIPHENHYDRAMINE HCL 12.5 MG/5ML PO ELIX: 12.5000 mg | ORAL_SOLUTION | ORAL | Status: DC | PRN

## 2012-09-23 MED ORDER — PHENOL 1.4 % MT LIQD: 1.0000 | OROMUCOSAL | Status: DC | PRN

## 2012-09-23 MED ORDER — 0.9 % SODIUM CHLORIDE (POUR BTL) OPTIME
TOPICAL | Status: DC | PRN
  Administered 2012-09-23: 1000 mL

## 2012-09-23 MED ORDER — KETOROLAC TROMETHAMINE 15 MG/ML IJ SOLN
7.5000 mg | Freq: Four times a day (QID) | INTRAMUSCULAR | Status: AC
  Administered 2012-09-23 – 2012-09-24 (×4): 7.5 mg via INTRAVENOUS
  Filled 2012-09-23 (×4): qty 1

## 2012-09-23 MED ORDER — DOCUSATE SODIUM 100 MG PO CAPS
100.0000 mg | ORAL_CAPSULE | Freq: Two times a day (BID) | ORAL | Status: DC
  Administered 2012-09-23 – 2012-09-25 (×4): 100 mg via ORAL

## 2012-09-23 MED ORDER — PANTOPRAZOLE SODIUM 40 MG PO TBEC
40.0000 mg | DELAYED_RELEASE_TABLET | Freq: Every day | ORAL | Status: DC
  Filled 2012-09-23: qty 1

## 2012-09-23 MED ORDER — MENTHOL 3 MG MT LOZG: 1.0000 | LOZENGE | OROMUCOSAL | Status: DC | PRN

## 2012-09-23 MED ORDER — KCL IN DEXTROSE-NACL 20-5-0.9 MEQ/L-%-% IV SOLN
INTRAVENOUS | Status: DC
  Administered 2012-09-23: 100 mL/h via INTRAVENOUS
  Administered 2012-09-24: 04:00:00 via INTRAVENOUS
  Filled 2012-09-23 (×4): qty 1000

## 2012-09-23 MED ORDER — ONDANSETRON HCL 4 MG/2ML IJ SOLN
INTRAMUSCULAR | Status: DC | PRN
  Administered 2012-09-23 (×2): 2 mg via INTRAVENOUS

## 2012-09-23 MED ORDER — ACETAMINOPHEN 10 MG/ML IV SOLN: 1000.0000 mg | Freq: Once | INTRAVENOUS | Status: DC

## 2012-09-23 MED ORDER — HYDROMORPHONE HCL PF 1 MG/ML IJ SOLN
INTRAMUSCULAR | Status: DC | PRN
  Administered 2012-09-23 (×2): 1 mg via INTRAVENOUS

## 2012-09-23 MED ORDER — ONDANSETRON HCL 4 MG/2ML IJ SOLN: 4.0000 mg | Freq: Four times a day (QID) | INTRAMUSCULAR | Status: DC | PRN

## 2012-09-23 MED ORDER — NITROGLYCERIN 0.4 MG SL SUBL: 0.4000 mg | SUBLINGUAL_TABLET | SUBLINGUAL | Status: DC | PRN

## 2012-09-23 MED ORDER — HYDROMORPHONE HCL PF 1 MG/ML IJ SOLN
0.2500 mg | INTRAMUSCULAR | Status: DC | PRN
  Administered 2012-09-23 (×2): 0.5 mg via INTRAVENOUS

## 2012-09-23 MED ORDER — LACTATED RINGERS IV SOLN: INTRAVENOUS | Status: DC

## 2012-09-23 MED ORDER — DEXTROSE 5 % IV SOLN
3.0000 g | INTRAVENOUS | Status: AC
  Administered 2012-09-23: 3 g via INTRAVENOUS
  Filled 2012-09-23: qty 3000

## 2012-09-23 MED ORDER — METOCLOPRAMIDE HCL 5 MG PO TABS
5.0000 mg | ORAL_TABLET | Freq: Three times a day (TID) | ORAL | Status: DC | PRN
  Filled 2012-09-23: qty 2

## 2012-09-23 MED ORDER — RIVAROXABAN 10 MG PO TABS
10.0000 mg | ORAL_TABLET | Freq: Every day | ORAL | Status: DC
  Administered 2012-09-24 – 2012-09-25 (×2): 10 mg via ORAL
  Filled 2012-09-23 (×3): qty 1

## 2012-09-23 MED ORDER — LACTATED RINGERS IV SOLN
INTRAVENOUS | Status: DC
  Administered 2012-09-23: 1000 mL via INTRAVENOUS

## 2012-09-23 MED ORDER — ACETAMINOPHEN 650 MG RE SUPP: 650.0000 mg | Freq: Four times a day (QID) | RECTAL | Status: DC | PRN

## 2012-09-23 MED ORDER — METHOCARBAMOL 500 MG PO TABS
500.0000 mg | ORAL_TABLET | Freq: Four times a day (QID) | ORAL | Status: DC | PRN
  Administered 2012-09-23 – 2012-09-24 (×2): 500 mg via ORAL
  Filled 2012-09-23 (×2): qty 1

## 2012-09-23 MED ORDER — CEFAZOLIN SODIUM-DEXTROSE 2-3 GM-% IV SOLR
2.0000 g | Freq: Four times a day (QID) | INTRAVENOUS | Status: AC
  Administered 2012-09-23 (×2): 2 g via INTRAVENOUS
  Filled 2012-09-23 (×2): qty 50

## 2012-09-23 MED ORDER — ATORVASTATIN CALCIUM 80 MG PO TABS
80.0000 mg | ORAL_TABLET | Freq: Every day | ORAL | Status: DC
  Administered 2012-09-23 – 2012-09-24 (×2): 80 mg via ORAL
  Filled 2012-09-23 (×3): qty 1

## 2012-09-23 MED ORDER — FLUTICASONE PROPIONATE 50 MCG/ACT NA SUSP
2.0000 | Freq: Every day | NASAL | Status: DC
  Administered 2012-09-23 – 2012-09-24 (×2): 2 via NASAL
  Filled 2012-09-23: qty 16

## 2012-09-23 MED ORDER — BUPIVACAINE 0.25 % ON-Q PUMP SINGLE CATH 300ML
INJECTION | Status: DC | PRN
  Administered 2012-09-23: 300 mL

## 2012-09-23 MED ORDER — DEXTROSE 5 % IV SOLN
500.0000 mg | Freq: Four times a day (QID) | INTRAVENOUS | Status: DC | PRN
  Administered 2012-09-23: 500 mg via INTRAVENOUS
  Filled 2012-09-23: qty 5

## 2012-09-23 MED ORDER — BUPIVACAINE HCL (PF) 0.5 % IJ SOLN
INTRAMUSCULAR | Status: DC | PRN
  Administered 2012-09-23: 15 mg

## 2012-09-23 MED ORDER — LACTATED RINGERS IV SOLN
INTRAVENOUS | Status: DC | PRN
  Administered 2012-09-23 (×2): via INTRAVENOUS

## 2012-09-23 MED ORDER — POLYETHYLENE GLYCOL 3350 17 G PO PACK: 17.0000 g | PACK | Freq: Every day | ORAL | Status: DC | PRN

## 2012-09-23 MED ORDER — OXYCODONE HCL 5 MG PO TABS
5.0000 mg | ORAL_TABLET | ORAL | Status: DC | PRN
  Administered 2012-09-23 – 2012-09-24 (×3): 10 mg via ORAL
  Filled 2012-09-23 (×3): qty 2

## 2012-09-23 MED ORDER — ONDANSETRON HCL 4 MG PO TABS: 4.0000 mg | ORAL_TABLET | Freq: Four times a day (QID) | ORAL | Status: DC | PRN

## 2012-09-23 MED ORDER — PHENYLEPHRINE HCL 10 MG/ML IJ SOLN
INTRAMUSCULAR | Status: DC | PRN
  Administered 2012-09-23: 40 ug via INTRAVENOUS
  Administered 2012-09-23: 20 ug via INTRAVENOUS
  Administered 2012-09-23 (×4): 40 ug via INTRAVENOUS
  Administered 2012-09-23: 80 ug via INTRAVENOUS

## 2012-09-23 MED ORDER — MORPHINE SULFATE 10 MG/ML IJ SOLN: 1.0000 mg | INTRAMUSCULAR | Status: DC | PRN

## 2012-09-23 MED ORDER — SODIUM CHLORIDE 0.9 % IR SOLN
Status: DC | PRN
  Administered 2012-09-23: 2000 mL

## 2012-09-23 MED ORDER — PROPOFOL 10 MG/ML IV EMUL
INTRAVENOUS | Status: DC | PRN
  Administered 2012-09-23: 140 ug/kg/min via INTRAVENOUS

## 2012-09-23 MED ORDER — DEXAMETHASONE 6 MG PO TABS
10.0000 mg | ORAL_TABLET | Freq: Once | ORAL | Status: AC
  Administered 2012-09-24: 10 mg via ORAL
  Filled 2012-09-23: qty 1

## 2012-09-23 MED ORDER — TRAMADOL HCL 50 MG PO TABS: 50.0000 mg | ORAL_TABLET | Freq: Four times a day (QID) | ORAL | Status: DC | PRN

## 2012-09-23 MED ORDER — LORATADINE 10 MG PO TABS
10.0000 mg | ORAL_TABLET | Freq: Every day | ORAL | Status: DC
  Administered 2012-09-24 – 2012-09-25 (×2): 10 mg via ORAL
  Filled 2012-09-23 (×2): qty 1

## 2012-09-23 MED ORDER — DEXAMETHASONE SODIUM PHOSPHATE 10 MG/ML IJ SOLN: 10.0000 mg | Freq: Once | INTRAMUSCULAR | Status: AC

## 2012-09-23 MED ORDER — MIDAZOLAM HCL 5 MG/5ML IJ SOLN
INTRAMUSCULAR | Status: DC | PRN
  Administered 2012-09-23 (×2): 1 mg via INTRAVENOUS

## 2012-09-23 SURGICAL SUPPLY — 65 items
ADAPTER BOLT FEMORAL +2/-2 (Knees) ×2 IMPLANT
BAG ZIPLOCK 12X15 (MISCELLANEOUS) ×2 IMPLANT
BANDAGE ELASTIC 6 VELCRO ST LF (GAUZE/BANDAGES/DRESSINGS) ×2 IMPLANT
BANDAGE ESMARK 6X9 LF (GAUZE/BANDAGES/DRESSINGS) ×1 IMPLANT
BLADE SAG 18X100X1.27 (BLADE) ×2 IMPLANT
BLADE SAW SGTL 11.0X1.19X90.0M (BLADE) ×2 IMPLANT
BNDG ESMARK 6X9 LF (GAUZE/BANDAGES/DRESSINGS) ×2
BONE CEMENT GENTAMICIN (Cement) ×6 IMPLANT
CATH KIT ON-Q SILVERSOAK 5IN (CATHETERS) ×2 IMPLANT
CEMENT BONE GENTAMICIN 40 (Cement) ×3 IMPLANT
CLOTH BEACON ORANGE TIMEOUT ST (SAFETY) ×2 IMPLANT
COMP FEM CEM RT SZ4 (Orthopedic Implant) ×2 IMPLANT
COMPONENT FEM CEM RT SZ4 (Orthopedic Implant) ×1 IMPLANT
CONT SPECI 4OZ STER CLIK (MISCELLANEOUS) IMPLANT
CUFF TOURN SGL QUICK 34 (TOURNIQUET CUFF) ×1
CUFF TRNQT CYL 34X4X40X1 (TOURNIQUET CUFF) ×1 IMPLANT
DRAPE EXTREMITY T 121X128X90 (DRAPE) ×2 IMPLANT
DRAPE POUCH INSTRU U-SHP 10X18 (DRAPES) ×2 IMPLANT
DRAPE U-SHAPE 47X51 STRL (DRAPES) ×2 IMPLANT
DRSG ADAPTIC 3X8 NADH LF (GAUZE/BANDAGES/DRESSINGS) ×2 IMPLANT
DRSG PAD ABDOMINAL 8X10 ST (GAUZE/BANDAGES/DRESSINGS) ×2 IMPLANT
DURAPREP 26ML APPLICATOR (WOUND CARE) ×2 IMPLANT
ELECT REM PT RETURN 9FT ADLT (ELECTROSURGICAL) ×2
ELECTRODE REM PT RTRN 9FT ADLT (ELECTROSURGICAL) ×1 IMPLANT
EVACUATOR 1/8 PVC DRAIN (DRAIN) ×2 IMPLANT
FACESHIELD LNG OPTICON STERILE (SAFETY) ×10 IMPLANT
FEMORAL ADAPTER (Orthopedic Implant) ×2 IMPLANT
GLOVE BIO SURGEON STRL SZ8 (GLOVE) ×2 IMPLANT
GLOVE BIOGEL PI IND STRL 8 (GLOVE) ×2 IMPLANT
GLOVE BIOGEL PI INDICATOR 8 (GLOVE) ×2
GLOVE ECLIPSE 8.0 STRL XLNG CF (GLOVE) ×2 IMPLANT
GOWN STRL NON-REIN LRG LVL3 (GOWN DISPOSABLE) ×4 IMPLANT
GOWN STRL REIN XL XLG (GOWN DISPOSABLE) ×4 IMPLANT
HANDPIECE INTERPULSE COAX TIP (DISPOSABLE) ×1
IMMOBILIZER KNEE 20 (SOFTGOODS) ×2
IMMOBILIZER KNEE 20 THIGH 36 (SOFTGOODS) ×1 IMPLANT
INSERT TC3 SZ4 4X17.5MM (Knees) ×2 IMPLANT
KIT BASIN OR (CUSTOM PROCEDURE TRAY) ×2 IMPLANT
MANIFOLD NEPTUNE II (INSTRUMENTS) ×2 IMPLANT
NS IRRIG 1000ML POUR BTL (IV SOLUTION) ×2 IMPLANT
PACK TOTAL JOINT (CUSTOM PROCEDURE TRAY) ×2 IMPLANT
PADDING CAST COTTON 6X4 STRL (CAST SUPPLIES) ×6 IMPLANT
POSITIONER SURGICAL ARM (MISCELLANEOUS) ×2 IMPLANT
RESTRICTOR CEMENT SZ 5 C-STEM (Cement) ×2 IMPLANT
SET HNDPC FAN SPRY TIP SCT (DISPOSABLE) ×1 IMPLANT
SPONGE GAUZE 4X4 12PLY (GAUZE/BANDAGES/DRESSINGS) ×2 IMPLANT
SPONGE LAP 18X18 X RAY DECT (DISPOSABLE) ×2 IMPLANT
STAPLER VISISTAT 35W (STAPLE) ×2 IMPLANT
STEM TIBIA PFC 13X30MM (Stem) ×2 IMPLANT
STEM UNIVERSAL REVISION 75X18 (Stem) ×2 IMPLANT
SUCTION FRAZIER 12FR DISP (SUCTIONS) ×2 IMPLANT
SUT ETHIBOND NAB CT1 #1 30IN (SUTURE) ×4 IMPLANT
SUT VIC AB 2-0 CT1 27 (SUTURE) ×3
SUT VIC AB 2-0 CT1 TAPERPNT 27 (SUTURE) ×3 IMPLANT
SUT VLOC 180 0 24IN GS25 (SUTURE) ×2 IMPLANT
SWAB COLLECTION DEVICE MRSA (MISCELLANEOUS) IMPLANT
TOWEL OR 17X26 10 PK STRL BLUE (TOWEL DISPOSABLE) ×4 IMPLANT
TOWER CARTRIDGE SMART MIX (DISPOSABLE) ×2 IMPLANT
TRAY FOLEY CATH 14FRSI W/METER (CATHETERS) ×2 IMPLANT
TRAY REVISION SZ 4 (Knees) ×2 IMPLANT
TRAY SLEEVE CEM ML (Knees) ×2 IMPLANT
TUBE ANAEROBIC SPECIMEN COL (MISCELLANEOUS) IMPLANT
WATER STERILE IRR 1500ML POUR (IV SOLUTION) ×4 IMPLANT
WEDGE SIZE 4 5MM (Knees) ×4 IMPLANT
WRAP KNEE MAXI GEL POST OP (GAUZE/BANDAGES/DRESSINGS) ×2 IMPLANT

## 2012-09-23 NOTE — Transfer of Care (Signed)
Immediate Anesthesia Transfer of Care Note  Patient: Jeffrey Wiggins  Procedure(s) Performed: Procedure(s) (LRB) with comments: TOTAL KNEE REVISION (Right)  Patient Location: PACU  Anesthesia Type:Spinal  Level of Consciousness: awake, alert , oriented and patient cooperative  Airway & Oxygen Therapy: Patient Spontanous Breathing and Patient connected to face mask oxygen  Post-op Assessment: Report given to PACU RN, Post -op Vital signs reviewed and stable and Patient moving all extremities  Post vital signs: Reviewed and stable  Complications: No apparent anesthesia complications

## 2012-09-23 NOTE — H&P (View-Only) (Signed)
Jeffrey Wiggins  DOB: 07/29/1941 Married / Language: English / Race: White Male  Date of Admission:  09/23/2012  Chief Complaint:  Right Knee Pain  History of Present Illness The patient is a 71 year old male who comes in for a preoperative History and Physical. The patient is scheduled for a right total knee arthroplasty (revision) to be performed by Dr. Frank V. Aluisio, MD at [facility] on 09/23/2012. The patient states that the right knee is painful in addition to being stiff. The left knee is doing fantastic. The patient states he has worked very hard with a trainer and despite that is not getting much more than 95 degrees of bend. About one year ago we did an arthrotomy and scar excision. Initially, the patient did well but then started losing motion again. In addition, he has had some pain in his left hip. It is all along the lateral aspect of the hip. He is not having any groin pain. He is not having any lower extremity weakness or paresthesias with this. A bone scan was ordered. The bone scan did show apparent loosening of the right knee. He said he still has pain and stiffness. He is not getting any swelling. He does have increased uptake especially around the tibial component. Posterior femur also has some increased uptake. Painful right total knee. 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 He has a significant amount of stiffness which is increasing the stress on the components. He would like to procedd with revision surgery now. His surgery was originally scheduled for September of this year but his mother became very sick and he had to postpone his procedure and is now rescheduled. They have been treated conservatively in the past for the above stated problem and despite conservative measures, they continue to have progressive pain and severe functional limitations and dysfunction. They have failed non-operative management. It is felt that they would benefit from  undergoing total joint revision. Risks and benefits of the procedure have been discussed with the patient and they elect to proceed with surgery. There are no active contraindications to surgery such as ongoing infection or rapidly progressive neurological disease.   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 father, grandmother fathers side and grandfather fathers side Congestive Heart Failure. father   Social History Tobacco use. Former smoker. former smoker; smoke(d) 3 or more pack(s) per day; uses less than half 1/2 can(s) smokeless per week, former smoker; smoke(d) 1 pack(s) per day Tobacco / smoke exposure. yes outdoors only, no Post-Surgical Plans. Plan is to go home. 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 Occasional alcohol use. current drinker; drinks beer, wine and hard liquor; 5-7 per week Living situation. live with spouse Illicit drug use. no Number of flights of stairs before winded. 2-3 Marital status. married Exercise. Exercises weekly; does gym / weights, Exercises daily; does gym / weights Current work status. working part time Children. 5 or more, 4 Drug/Alcohol Rehab (Previously). no Drug/Alcohol Rehab (Currently). no   Past Surgical History Other Surgery. Date: 2011. Knee surgery to remove excess scar tissue Heart Stents. 1998, 2009 Prostatectomy; Transurethral. Date: 2011. Tonsillectomy. Date: 1947. Total Knee Replacement. bilateral; Left - 2006, Right - 2008 Vasectomy. Date: 1983.   Medical History Impaired Memory Prostate Disease. BPH   Bronchitis Hemorrhoids Stent. Stents X 2 Myocardial infarction. 01/1997; 05/2008 Mumps Measles High blood  pressure Coronary artery disease Gastroesophageal Reflux Disease Hypercholesterolemia Pneumonia Rubella   Review of Systems General:Not Present- Chills, Fever, Night Sweats, Appetite Loss, Fatigue, Feeling sick, Weight Gain and Weight Loss. Skin:Not Present- Itching, Rash, Skin Color Changes, Ulcer, Psoriasis and Change in Hair or Nails. HEENT:Not Present- Sensitivity to light, Hearing problems, Nose Bleed and Ringing in the Ears. Neck:Not Present- Swollen Glands and Neck Mass. Respiratory:Not Present- Snoring, Chronic Cough, Bloody sputum and Dyspnea. Cardiovascular:Present- Leg Cramps and Palpitations. Not Present- Shortness of Breath, Chest Pain and Swelling of Extremities. Gastrointestinal:Not Present- Bloody Stool, Heartburn, Abdominal Pain, Vomiting, Nausea and Incontinence of Stool. Male Genitourinary:Present- Frequency. Not Present- Blood in Urine, Incontinence and Nocturia. Musculoskeletal:Present- Joint Stiffness, Joint Swelling, Joint Pain and Back Pain. Not Present- Muscle Weakness and Muscle Pain. Neurological:Not Present- Tingling, Numbness, Burning, Tremor, Headaches and Dizziness. Psychiatric:Not Present- Anxiety, Depression and Memory Loss. Endocrine:Not Present- Cold Intolerance, Heat Intolerance, Excessive hunger and Excessive Thirst. Hematology:Not Present- Abnormal Bleeding, Anemia, Blood Clots and Easy Bruising.   Vitals Weight: 280 lb Height: 67 in Weight was reported by patient. Height was reported by patient. Body Surface Area: 2.45 m Body Mass Index: 43.85 kg/m Pulse: 88 (Regular) Resp.: 16 (Unlabored) BP: 146/72 (Sitting, Right Arm, Standard)  Physical Exam The physical exam findings are as follows:  Patient is a 72 year old male with continued knee pain.  General Mental Status - Alert, cooperative and good historian. General Appearance- pleasant. Not in acute distress. Orientation- Oriented X3. Build & Nutrition-  Well nourished and Well developed.   Head and Neck Head- normocephalic, atraumatic . Neck Global Assessment- supple. no bruit auscultated on the right and no bruit auscultated on the left.   Eye Vision- Wears corrective lenses. Pupil- Bilateral- Regular and Round. Note: wears glasses Motion- Bilateral- EOMI.   ENMT EarsNote: wears bilateral hearing aids   Chest and Lung Exam Auscultation: Breath sounds:- clear at anterior chest wall and - clear at posterior chest wall. Adventitious sounds:- No Adventitious sounds.   Cardiovascular Auscultation:Rhythm- Regular rate and rhythm. Heart Sounds- S1 WNL and S2 WNL. Murmurs & Other Heart Sounds: Murmur 1:Location- Aortic Area and Pulmonic Area. Timing- Mid-systolic. Grade- II/VI.   Abdomen Inspection:Contour- Generalized moderate distention. Palpation/Percussion:Tenderness- Abdomen is non-tender to palpation. Rigidity (guarding)- Abdomen is soft. Auscultation:Auscultation of the abdomen reveals - Bowel sounds normal.   Male Genitourinary Not done, not pertinent to present illness  Musculoskeletal On exam, he is alert and oriented in no apparent distress. His right knee shows no effusion. His range is about 0 to 90 or 95 degrees. No instability noted.  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 Aluisio.  Plan is to go home.  PCP - Dr. Eve Knapp Cards - Dr. Crenshaw Uro - Dr. Sigmund Tannenbaum  Signed electronically by DREW L PERKINS, PA-C 

## 2012-09-23 NOTE — Anesthesia Procedure Notes (Signed)
Spinal  Patient location during procedure: OR Start time: 09/23/2012 11:05 AM End time: 09/23/2012 11:15 AM Staffing Anesthesiologist: Ronelle Nigh L Performed by: anesthesiologist  Preanesthetic Checklist Completed: patient identified, site marked, surgical consent, pre-op evaluation, timeout performed, IV checked, risks and benefits discussed and monitors and equipment checked Spinal Block Patient position: sitting Prep: Betadine Patient monitoring: heart rate, continuous pulse ox and blood pressure Approach: midline Location: L3-4 Injection technique: single-shot Needle Needle type: Spinocan  Needle gauge: 22 G Needle length: 15 cm Assessment Sensory level: T6 Additional Notes Expiration date of kit checked and confirmed. Patient tolerated procedure well, without complications.

## 2012-09-23 NOTE — Transfer of Care (Signed)
Immediate Anesthesia Transfer of Care Note  Patient: Jeffrey Wiggins  Procedure(s) Performed: Procedure(s) (LRB) with comments: TOTAL KNEE REVISION (Right)  Patient Location: PACU  Anesthesia Type:Spinal  Level of Consciousness: awake, alert , oriented and patient cooperative  Airway & Oxygen Therapy: Patient Spontanous Breathing and Patient connected to face mask oxygen  Post-op Assessment: Report given to PACU RN, Post -op Vital signs reviewed and stable and Patient moving all extremities  Post vital signs: Reviewed and stable  Complications: No apparent anesthesia complications 

## 2012-09-23 NOTE — Interval H&P Note (Signed)
History and Physical Interval Note:  09/23/2012 10:22 AM  Jeffrey Wiggins  has presented today for surgery, with the diagnosis of loose right total knee arthroplasty  The various methods of treatment have been discussed with the patient and family. After consideration of risks, benefits and other options for treatment, the patient has consented to  Procedure(s) (LRB) with comments: TOTAL KNEE REVISION (Right) as a surgical intervention .  The patient's history has been reviewed, patient examined, no change in status, stable for surgery.  I have reviewed the patient's chart and labs.  Questions were answered to the patient's satisfaction.     Loanne Drilling

## 2012-09-23 NOTE — Anesthesia Postprocedure Evaluation (Signed)
  Anesthesia Post-op Note  Patient: Jeffrey Wiggins  Procedure(s) Performed: Procedure(s) (LRB): TOTAL KNEE REVISION (Right)  Patient Location: PACU  Anesthesia Type: Spinal  Level of Consciousness: awake and alert   Airway and Oxygen Therapy: Patient Spontanous Breathing  Post-op Pain: mild  Post-op Assessment: Post-op Vital signs reviewed, Patient's Cardiovascular Status Stable, Respiratory Function Stable, Patent Airway and No signs of Nausea or vomiting  Last Vitals:  Filed Vitals:   09/23/12 1330  BP: 109/57  Pulse: 76  Temp:   Resp: 12    Post-op Vital Signs: stable   Complications: No apparent anesthesia complications

## 2012-09-23 NOTE — Anesthesia Preprocedure Evaluation (Addendum)
Anesthesia Evaluation  Patient identified by MRN, date of birth, ID band Patient awake    Reviewed: Allergy & Precautions, H&P , NPO status , Patient's Chart, lab work & pertinent test results  Airway Mallampati: II TM Distance: >3 FB Neck ROM: full    Dental  (+) Caps and Dental Advisory Given,    Pulmonary sleep apnea ,  Mild OSA breath sounds clear to auscultation  Pulmonary exam normal       Cardiovascular hypertension, Pt. on medications + CAD, + Past MI and + Cardiac Stents Rhythm:regular Rate:Normal  MI 98,09.  Hx. bradycardia   Neuro/Psych Cerebrovascular disease.  Bilateral carotid plaques. negative neurological ROS  negative psych ROS   GI/Hepatic negative GI ROS, Neg liver ROS, GERD-  Medicated and Controlled,  Endo/Other  negative endocrine ROSMorbid obesity  Renal/GU negative Renal ROS  negative genitourinary   Musculoskeletal   Abdominal   Peds  Hematology negative hematology ROS (+)   Anesthesia Other Findings   Reproductive/Obstetrics negative OB ROS                          Anesthesia Physical Anesthesia Plan  ASA: III  Anesthesia Plan: Spinal   Post-op Pain Management:    Induction:   Airway Management Planned: Simple Face Mask  Additional Equipment:   Intra-op Plan:   Post-operative Plan:   Informed Consent: I have reviewed the patients History and Physical, chart, labs and discussed the procedure including the risks, benefits and alternatives for the proposed anesthesia with the patient or authorized representative who has indicated his/her understanding and acceptance.   Dental Advisory Given  Plan Discussed with: CRNA and Surgeon  Anesthesia Plan Comments:        Anesthesia Quick Evaluation

## 2012-09-23 NOTE — Brief Op Note (Signed)
09/23/2012  12:57 PM  PATIENT:  Jeffrey Wiggins  72 y.o. male  PRE-OPERATIVE DIAGNOSIS: Failed right total knee arthroplasty  POST-OPERATIVE DIAGNOSIS:  Failed right total knee arthroplasty  PROCEDURE:  Procedure(s) (LRB) with comments: TOTAL KNEE REVISION (Right)  SURGEON:  Surgeon(s) and Role:    * Loanne Drilling, MD - Primary  PHYSICIAN ASSISTANT:   ASSISTANTS: Leilani Able, PA-C   ANESTHESIA:   spinal  EBL:  Total I/O In: 1000 [I.V.:1000] Out: 350 [Urine:150; Blood:200]  BLOOD ADMINISTERED:none  DRAINS: (Medium) Hemovact drain(s) in the right knee with  Suction Open   LOCAL MEDICATIONS USED:  OTHER On-Q Marcaine pain pump  COUNTS:  YES  TOURNIQUET:   Total Tourniquet Time Documented: Thigh (Right) - 72 minutes  DICTATION: .Other Dictation: Dictation Number [id number]  PLAN OF CARE: Admit to inpatient   PATIENT DISPOSITION:  PACU - hemodynamically stable.

## 2012-09-23 NOTE — Preoperative (Signed)
Beta Blockers   Reason not to administer Beta Blockers:Not Applicable 

## 2012-09-24 ENCOUNTER — Encounter (HOSPITAL_COMMUNITY): Payer: Self-pay | Admitting: Orthopedic Surgery

## 2012-09-24 DIAGNOSIS — D62 Acute posthemorrhagic anemia: Secondary | ICD-10-CM | POA: Diagnosis not present

## 2012-09-24 LAB — CBC
MCH: 28.7 pg (ref 26.0–34.0)
MCHC: 31.3 g/dL (ref 30.0–36.0)
MCV: 91.6 fL (ref 78.0–100.0)
Platelets: 220 10*3/uL (ref 150–400)
RDW: 15 % (ref 11.5–15.5)
WBC: 9.3 10*3/uL (ref 4.0–10.5)

## 2012-09-24 LAB — BASIC METABOLIC PANEL
Calcium: 8.6 mg/dL (ref 8.4–10.5)
Chloride: 101 mEq/L (ref 96–112)
Creatinine, Ser: 1.1 mg/dL (ref 0.50–1.35)
GFR calc Af Amer: 76 mL/min — ABNORMAL LOW (ref >90)
GFR calc non Af Amer: 66 mL/min — ABNORMAL LOW (ref >90)

## 2012-09-24 MED ORDER — OMEPRAZOLE 20 MG PO CPDR
20.0000 mg | DELAYED_RELEASE_CAPSULE | Freq: Every day | ORAL | Status: DC
  Administered 2012-09-25: 20 mg via ORAL
  Filled 2012-09-24 (×2): qty 1

## 2012-09-24 MED ORDER — NON FORMULARY: 20.0000 mg | Freq: Every morning | Status: DC

## 2012-09-24 NOTE — Progress Notes (Signed)
Utilization review completed.  

## 2012-09-24 NOTE — Op Note (Signed)
NAME:  Jeffrey Wiggins, Jeffrey Wiggins NO.:  [id number]  MEDICAL RECORD NO.:  [id number]  LOCATION:  1611                         FACILITY:  [facility]  PHYSICIAN:  Ollen Gross, M.D.    DATE OF BIRTH:  October 15, 1940  DATE OF PROCEDURE:  09/23/2012 DATE OF DISCHARGE:                              OPERATIVE REPORT   PREOPERATIVE DIAGNOSIS:  Failed right total knee arthroplasty.  POSTOPERATIVE DIAGNOSIS:  Failed right total knee arthroplasty.  PROCEDURE:  Right total knee arthroplasty revision.  SURGEON:  Ollen Gross, M.D.  ASSISTANT:  Jaquelyn Bitter. Chabon, P.A.  ANESTHESIA:  Spinal.  ESTIMATED BLOOD LOSS:  Minimal.  DRAINS:  Hemovac x1 with 2 limbs, 1 subcu and 1 in the knee.  TOURNIQUET TIME:  Upto 46 minutes at 300 mmHg, down 8 minutes, and then up additional 21 minutes at 300 mmHg.  COMPLICATIONS:  None.  CONDITION:  Stable to recovery.  BRIEF CLINICAL NOTE:  Jeffrey Wiggins is a 72 year old male with a complex history in regards to his right knee.  He has had a primary total knee arthroplasty.  Initially, he had a scar excision.  He has had persistent pain and stiffness.  Bone scan showed probable loosening.  He presents now for revision given the pain, stiffness, and probable loosening.  PROCEDURE IN DETAIL:  After successful administration of general anesthetic, a tourniquet was placed on his right thigh and right lower extremity was prepped and draped in the usual sterile fashion.  His exam under anesthesia shows range of motion about 5-90 degrees.  The extremity was then wrapped in Esmarch, knee flexed, tourniquet inflated to 300 mmHg.  A midline incision was made with a 10 blade through subcutaneous tissue.  He had a lot of scarring in subcutaneous tissue, so I dissected to remove the subcutaneous tissue from the deep tissue creating subcu flaps.  We then used a fresh blade to make a medial parapatellar arthrotomy.  Did not encounter any fluid in the joint.  He has  thick dense scarring, multiple lines were used to remove the eschar. Soft tissue of the proximal medial tibia was then subperiosteally elevated to the joint line with the knife and into the semimembranosus bursa with a Cobb elevator.  Soft tissue laterally was also elevated with attention being paid to avoid the patellar tendon on the tibial tubercle.  Too much dense scarring to evert the patella, so I did do a quadriceps snip proximally.  I was then able to evert the patella.  I was able to remove the tibial polyethylene from the tibial tray.  I then placed retractors posterior to sublux the tibia forward and then medial and lateral retractors were placed.  There was a large amount of heterotopic bone that developed circumferentially around the edges of the tibial component.  This was all removed.  I then used an oscillating saw to disrupt the interface between the tibial component and bone. Components removed without any bone loss.  Cement was then removed.  We then thoroughly irrigated the tibial canal and reamed up to 13 mm.  The extramedullary tibial alignment guide was placed referencing proximally at the medial aspect of the tibial tubercle and distally  along the second metatarsal axis and tibial crest.  The block is pinned to remove 2 mm off the cut bone surface.  The size 4 was most appropriate.  The proximal tibia was prepared with the modular drill for the size 4. Then, prepared for 29-mm sleeve for rotational control.  Trial was placed, putting 5-mm augments on each side with a size 4 MBT revision tibial tray 13 x 30 stem extension and 29 sleeve.  We then disrupted the interface between the femoral component and bone using osteotomes.  Femoral components were removed with minimal of any bone loss.  Femoral canal was entered and reaming performed at 18 mm and excellent press fit was 18.  The 5 degree right valgus alignment guide was then placed for the distal femoral cut,  about 2 mm was taken off the distal femur.  We did not need any augments distally.  Size 4 was most appropriate femoral component.  Rotation was marked at the epicondylar axis and a size 4 cutting block was placed in that rotation in a +2 position effectively raising the stem and lowering the anterior flange of the component down to the anterior cortex of the femur.  The rotation was marked off the epicondylar axis and confirmed by placing a spacer block and creating rectangular flexion gap at 90 degrees.  The anterior posterior chamfer cuts were made.  Intercondylar block was placed and that cuts made for the TC3 femur.  A trial femur was then placed, size 4 TC3 femur with an 18 x 75 stem extension and +2 position 5 degrees right valgus.  Tibial trial was also placed which was a size 4 MBT revision tray with a 13 x 30 stem extension, 29 sleeve, and 5-mm medial and lateral augments.  Tried a 15-mm insert first and with a little bit hyperextension 15 x 75 which allowed for full extension with excellent varus-valgus and anterior-posterior balance throughout full range of motion.  Patella components intact.  There was a lot of tissue overlying into the patella plasty to expose the component.  A lot of spurring around the edges of the patella and this was all removed.  The components were in good condition and is well fixed, thus I did not remove it.  It tracked normally through range of motion.  The tourniquet was then released.  Initial tourniquet time of 46 minutes.  It was held down for 8 minutes and minor bleeding stopped with cautery.  The components were assembled on the back table and all tourniquets down.  Once the components were assembled and once it is down for 8 minutes, and the legs rewrapped in Esmarch and tourniquet reinflated to 300 mmHg.  The trials were removed.  We then trialed the cement restrictor which was size 5.  Size 5 restrictor was placed to the appropriate depth  in the tibial canal.  All the cut bone surfaces were then prepared with pulsatile lavage and cement was mixed.  This was 3 batches of gentamicin impregnated cement.  The cement was injected into the tibia and tibial component was cemented.  Extruded cement was removed.  The femoral components were also cemented and extruded cement removed.  A 17.5 trial insert was placed.  Knee held in full extension. All extruded cement removed.  When the cement was fully hardened and the permanent TC3 17.5-mm post rotating platform insert was placed.  The wound was copiously irrigated with saline solution.  The Hemovac drain was placed in the deep tissues.  The quad snip was repaired with interrupted #1 Ethibond suture.  The arthrotomy was then closed over the Hemovac drain with a running #1 V-Loc suture.  The tourniquet was then released for a second tourniquet time of 21 minutes.  Further irrigation was performed.  The drain was placed subcu into the subcu tissue closed with interrupted 2-0 Vicryl and skin closed with staples.  Catheter for the Marcaine pain pumps placed and pumps initiated.  Incision is clean, dry, and a bulky sterile dressing applied.  Drains hooked to suction and he was placed into a knee immobilizer, awakened, and transferred to recovery in stable condition. Please note that a surgical assistant is a medical necessity for this procedure performed in a safe and expeditious manner.  Surgical assistant was necessary for retraction of vital neurovascular structures and ligaments and also to allow for proper anatomic placement of the prosthetic components.     Ollen Gross, M.D.     FA/MEDQ  D:  09/23/2012  T:  09/24/2012  Job:  [id number]

## 2012-09-24 NOTE — Progress Notes (Signed)
   Subjective: 1 Day Post-Op Procedure(s) (LRB): TOTAL KNEE REVISION (Right) Patient reports pain as mild.   Patient seen in rounds with Dr. Lequita Halt. Patient is well, and has had no acute complaints or problems We will start therapy today.  Plan is to go Home after hospital stay.  Objective: Vital signs in last 24 hours: Temp:  [97.8 F (36.6 C)-98.8 F (37.1 C)] 98.1 F (36.7 C) (01/09 0558) Pulse Rate:  [75-100] 97  (01/09 0558) Resp:  [9-18] 16  (01/09 0558) BP: (103-150)/(44-80) 118/68 mmHg (01/09 0558) SpO2:  [96 %-99 %] 98 % (01/09 0558) Weight:  [125.193 kg (276 lb)] 125.193 kg (276 lb) (01/08 1549)  Intake/Output from previous day:  Intake/Output Summary (Last 24 hours) at 09/24/12 0817 Last data filed at 09/24/12 0604  Gross per 24 hour  Intake 3773.33 ml  Output   2030 ml  Net 1743.33 ml    Intake/Output this shift: UOP 350 since MN +1743  Labs:  [facility] 09/24/12 0427  HGB 11.0*    Basename 09/24/12 0427  WBC 9.3  RBC 3.83*  HCT 35.1*  PLT 220    Basename 09/24/12 0427  NA 136  K 4.0  CL 101  CO2 31  BUN 12  CREATININE 1.10  GLUCOSE 152*  CALCIUM 8.6   No results found for this basename: LABPT:2,INR:2 in the last 72 hours  EXAM General - Patient is Alert, Appropriate and Oriented Extremity - Neurovascular intact Sensation intact distally Dorsiflexion/Plantar flexion intact Dressing - dressing C/D/I Motor Function - intact, moving foot and toes well on exam.  Hemovac pulled without difficulty.  Past Medical History  Diagnosis Date  . CAD (coronary artery disease)   . HTN (hypertension)   . Prostatitis   . Hyperlipidemia   . Obesity   . Diverticular disease   . Gallstones   . Hepatitis age 21  . Arthritis   . Sleep apnea     mild, CPAP wasn't indicated (per pt)  . Benign prostatic hypertrophy     followed by Dr. Patsi Sears  . Cerebrovascular disease   . Sepsis due to Escherichia coli ? 2011    DUE TO URINARY OUTLET  OBSTRUCTION /INFECTION  . UTI (urinary tract infection)     secondary  . Allergy     RHINITIS  . 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 Jens Som); s/p stents  . GERD (gastroesophageal reflux disease)   . Hypogonadism male     treated by Dr. Patsi Sears  . Erectile dysfunction   . Peripheral vascular disease     02/28/12 MOST RECENT CAROTID DUPLEX--"STABLE MILD CALCIFIED PLAQUE, BILATERALY, STABLE, OVER SERIAL EXAMS"  . 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 IFG (impaired fasting glucose)   . Pneumonia     hx of    Assessment/Plan: 1 Day Post-Op Procedure(s) (LRB): TOTAL KNEE REVISION (Right) Principal Problem:  *Failed total knee arthroplasty Active Problems:  Postop Acute blood loss anemia  Estimated Body mass index is 43.23 kg/(m^2) as calculated from the following:   Height as of this encounter: 5\' 7" (1.702 m).   Weight as of this encounter: 276 lb(125.193 kg). Advance diet Up with therapy Discharge home with home health when met goals  DVT Prophylaxis - Xarelto, 81 mg ASA on hold Weight Bearing As Tolerated right Leg Hemovac Pulled Begin Therapy No vaccines.  Doll Frazee 09/24/2012, 8:17 AM

## 2012-09-24 NOTE — Progress Notes (Signed)
OT Cancellation Note  Patient Details Name: Jeffrey Wiggins MRN: [id number] DOB: [birth year]   Cancelled Treatment:    Reason Eval/Treat Not Completed: Other (comment) (screen; had other TKA surgeries.  No needs)  Rosalee Kaufman, OTR/L [phone] 1/9/20141/05/2013, 3:49 PM

## 2012-09-24 NOTE — Evaluation (Signed)
Physical Therapy Evaluation Patient Details Name: Jeffrey Wiggins MRN: [id number] DOB: 1941-02-04 Today's Date: 09/24/2012 Time: 4098-1191 PT Time Calculation (min): 25 min  PT Assessment / Plan / Recommendation Clinical Impression  72 yo male s/p L TKA revision. On eval pt required Min assist for mobility. Anticipate pt will progress well. Recommend HHPT to improve strength, gait, ROM and maximize independence.     PT Assessment  Patient needs continued PT services    Follow Up Recommendations  Home health PT    Does the patient have the potential to tolerate intense rehabilitation      Barriers to Discharge        Equipment Recommendations  None recommended by PT    Recommendations for Other Services OT consult   Frequency 7X/week    Precautions / Restrictions Precautions Precautions: Knee Required Braces or Orthoses: Knee Immobilizer - Left Knee Immobilizer - Left: Discontinue once straight leg raise with < 10 degree lag Restrictions Weight Bearing Restrictions: No LLE Weight Bearing: Weight bearing as tolerated   Pertinent Vitals/Pain 3/10 L knee      Mobility  Bed Mobility Bed Mobility: Supine to Sit Supine to Sit: 4: Min assist Details for Bed Mobility Assistance: Assist for L LE off bed.  Transfers Transfers: Sit to Stand;Stand to Sit Sit to Stand: 4: Min guard;From bed;From elevated surface Stand to Sit: 4: Min guard;To chair/3-in-1;With upper extremity assist Details for Transfer Assistance: VCs safety, technique, hand placement.  Ambulation/Gait Ambulation/Gait Assistance: 4: Min guard Ambulation Distance (Feet): 100 Feet Assistive device: Rolling walker Ambulation/Gait Assistance Details: VCs safety, technique,sequence.  Gait Pattern: Step-to pattern;Decreased stride length;Antalgic    Shoulder Instructions     Exercises     PT Diagnosis: Difficulty walking;Acute pain;Abnormality of gait  PT Problem List: Decreased strength;Decreased range of  motion;Decreased mobility;Pain;Decreased knowledge of use of DME PT Treatment Interventions: DME instruction;Gait training;Stair training;Functional mobility training;Therapeutic activities;Therapeutic exercise;Patient/family education   PT Goals Acute Rehab PT Goals PT Goal Formulation: With patient Time For Goal Achievement: 10/01/12 Potential to Achieve Goals: Good Pt will go Supine/Side to Sit: with supervision PT Goal: Supine/Side to Sit - Progress: Goal set today Pt will go Sit to Supine/Side: with supervision PT Goal: Sit to Supine/Side - Progress: Goal set today Pt will go Sit to Stand: with supervision PT Goal: Sit to Stand - Progress: Goal set today Pt will Ambulate: 51 - 150 feet;with supervision;with rolling walker PT Goal: Ambulate - Progress: Goal set today Pt will Go Up / Down Stairs: 3-5 stairs;with rail(s);with least restrictive assistive device PT Goal: Up/Down Stairs - Progress: Goal set today Pt will Perform Home Exercise Program: with supervision, verbal cues required/provided PT Goal: Perform Home Exercise Program - Progress: Goal set today  Visit Information  Last PT Received On: 09/24/12 Assistance Needed: +1    Subjective Data  Subjective: "Everyone has been good" Patient Stated Goal: home tomorrow   Prior Functioning  Home Living Lives With: Spouse Available Help at Discharge: Family Type of Home: House Home Access: Stairs to enter Secretary/administrator of Steps: 4 Entrance Stairs-Rails: Right;Left Home Layout: Two level;Able to live on main level with bedroom/bathroom Bathroom Shower/Tub: Walk-in shower Home Adaptive Equipment: Walker - rolling;Straight cane;Crutches Prior Function Level of Independence: Independent Able to Take Stairs?: Yes Communication Communication: No difficulties    Cognition  Overall Cognitive Status: Appears within functional limits for tasks assessed/performed Arousal/Alertness: Awake/alert Orientation Level:  Appears intact for tasks assessed Behavior During Session: [facility] for tasks performed  Extremity/Trunk Assessment Right Lower Extremity Assessment RLE ROM/Strength/Tone: WFL for tasks assessed Left Lower Extremity Assessment LLE ROM/Strength/Tone: Deficits LLE ROM/Strength/Tone Deficits: SLR 2/5, moves ankle well Trunk Assessment Trunk Assessment: Normal   Balance    End of Session PT - End of Session Equipment Utilized During Treatment: Gait belt;Left knee immobilizer Activity Tolerance: Patient tolerated treatment well Patient left: in chair;with call bell/phone within reach  GP     Rebeca Alert [facility] 09/24/2012, 12:48 PM [phone]

## 2012-09-24 NOTE — Progress Notes (Signed)
Physical Therapy Treatment Patient Details Name: Jeffrey Wiggins MRN: [id number] DOB: 01/27/1941 Today's Date: 09/24/2012 Time: 5621-3086 PT Time Calculation (min): 32 min  PT Assessment / Plan / Recommendation Comments on Treatment Session  Progressing well. Plan is for d/c home on tomorrow. Will need to practice steps.     Follow Up Recommendations  Home health PT     Does the patient have the potential to tolerate intense rehabilitation     Barriers to Discharge        Equipment Recommendations  None recommended by PT    Recommendations for Other Services OT consult  Frequency 7X/week   Plan Discharge plan remains appropriate    Precautions / Restrictions Precautions Precautions: Knee Required Braces or Orthoses: Knee Immobilizer - Left Knee Immobilizer - Left: Discontinue once straight leg raise with < 10 degree lag Restrictions Weight Bearing Restrictions: No LLE Weight Bearing: Weight bearing as tolerated   Pertinent Vitals/Pain 2/10 R knee    Mobility  Bed Mobility Bed Mobility: Sit to Supine Sit to Supine: 4: Min assist Details for Bed Mobility Assistance: Assist for R LE onto bed. Transfers Transfers: Sit to Stand;Stand to Sit Sit to Stand: From chair/3-in-1;4: Min guard;With upper extremity assist Stand to Sit: 4: Min guard;To bed;With upper extremity assist Details for Transfer Assistance: VCs safety, technique, hand placement. Ambulation/Gait Ambulation/Gait Assistance: 4: Min guard Ambulation Distance (Feet): 185 Feet Assistive device: Rolling walker Ambulation/Gait Assistance Details: VCs safety, sequence.  Gait Pattern: Step-to pattern;Decreased stride length;Antalgic    Exercises Total Joint Exercises Ankle Circles/Pumps: AROM;Both;Supine;15 reps Quad Sets: Both;Supine;15 reps Heel Slides: AAROM;Right;15 reps;Supine Hip ABduction/ADduction: AROM;Right;15 reps;Supine Straight Leg Raises: AAROM;Right;AROM;Supine   PT Diagnosis: Difficulty  walking;Acute pain;Abnormality of gait  PT Problem List: Decreased strength;Decreased range of motion;Decreased mobility;Pain;Decreased knowledge of use of DME PT Treatment Interventions: DME instruction;Gait training;Stair training;Functional mobility training;Therapeutic activities;Therapeutic exercise;Patient/family education   PT Goals Acute Rehab PT Goals PT Goal Formulation: With patient Time For Goal Achievement: 10/01/12 Potential to Achieve Goals: Good Pt will go Supine/Side to Sit: with supervision PT Goal: Supine/Side to Sit - Progress: Goal set today Pt will go Sit to Supine/Side: with supervision PT Goal: Sit to Supine/Side - Progress: Progressing toward goal Pt will go Sit to Stand: with supervision PT Goal: Sit to Stand - Progress: Progressing toward goal Pt will Ambulate: 51 - 150 feet;with supervision;with rolling walker PT Goal: Ambulate - Progress: Progressing toward goal Pt will Go Up / Down Stairs: 3-5 stairs;with rail(s);with least restrictive assistive device PT Goal: Up/Down Stairs - Progress: Goal set today Pt will Perform Home Exercise Program: with supervision, verbal cues required/provided PT Goal: Perform Home Exercise Program - Progress: Progressing toward goal  Visit Information  Last PT Received On: 09/24/12 Assistance Needed: +1    Subjective Data  Subjective: "Did I work hard enough for you?" Patient Stated Goal: home tomorrow   Cognition  Overall Cognitive Status: Appears within functional limits for tasks assessed/performed Arousal/Alertness: Awake/alert Orientation Level: Appears intact for tasks assessed Behavior During Session: [facility] for tasks performed    Balance     End of Session PT - End of Session Equipment Utilized During Treatment: Gait belt;Left knee immobilizer Activity Tolerance: Patient tolerated treatment well Patient left: in bed;with call bell/phone within reach   GP     Rebeca Alert [place] 09/24/2012, 2:54  PM [phone]

## 2012-09-25 LAB — BASIC METABOLIC PANEL
CO2: 32 mEq/L (ref 19–32)
Chloride: 102 mEq/L (ref 96–112)
Creatinine, Ser: 0.89 mg/dL (ref 0.50–1.35)
Glucose, Bld: 152 mg/dL — ABNORMAL HIGH (ref 70–99)
Sodium: 139 mEq/L (ref 135–145)

## 2012-09-25 LAB — CBC
Hemoglobin: 10.9 g/dL — ABNORMAL LOW (ref 13.0–17.0)
MCV: 91.6 fL (ref 78.0–100.0)
Platelets: 212 10*3/uL (ref 150–400)
RBC: 3.79 MIL/uL — ABNORMAL LOW (ref 4.22–5.81)
WBC: 12.1 10*3/uL — ABNORMAL HIGH (ref 4.0–10.5)

## 2012-09-25 MED ORDER — RIVAROXABAN 10 MG PO TABS: 10.0000 mg | ORAL_TABLET | Freq: Every day | ORAL | Status: DC

## 2012-09-25 MED ORDER — METHOCARBAMOL 500 MG PO TABS: 500.0000 mg | ORAL_TABLET | Freq: Four times a day (QID) | ORAL | Status: DC | PRN

## 2012-09-25 MED ORDER — OXYCODONE HCL 5 MG PO TABS: 5.0000 mg | ORAL_TABLET | ORAL | Status: DC | PRN

## 2012-09-25 NOTE — Progress Notes (Signed)
Discharged home via wheelchair. States understanding of discharge instructions. Rx for oxycodone, robaxin, and xarelto given.

## 2012-09-25 NOTE — Discharge Summary (Signed)
Physician Discharge Summary   Patient ID: Jeffrey Wiggins MRN: [id number] DOB/AGE: [birth year] 72 y.o.  Admit date: 09/23/2012 Discharge date: 09/25/2012  Primary Diagnosis: Failed right total knee arthroplasty  Admission Diagnoses:  Past Medical History  Diagnosis Date  . CAD (coronary artery disease)   . HTN (hypertension)   . Prostatitis   . Hyperlipidemia   . Obesity   . Diverticular disease   . Gallstones   . Hepatitis age 27  . Arthritis   . Sleep apnea     mild, CPAP wasn't indicated (per pt)  . Benign prostatic hypertrophy     followed by Dr. Patsi Sears  . Cerebrovascular disease   . Sepsis due to Escherichia coli ? 2011    DUE TO URINARY OUTLET OBSTRUCTION /INFECTION  . UTI (urinary tract infection)     secondary  . Allergy     RHINITIS  . 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 Jens Som); s/p stents  . GERD (gastroesophageal reflux disease)   . Hypogonadism male     treated by Dr. Patsi Sears  . Erectile dysfunction   . Peripheral vascular disease     02/28/12 MOST RECENT CAROTID DUPLEX--"STABLE MILD CALCIFIED PLAQUE, BILATERALY, STABLE, OVER SERIAL EXAMS"  . 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 IFG (impaired fasting glucose)   . Pneumonia     hx of   Discharge Diagnoses:   Principal Problem:  *Failed total knee arthroplasty Active Problems:  Postop Acute blood loss anemia  Estimated Body mass index is 43.23 kg/(m^2) as calculated from the following:   Height as of this encounter: 5\' 7" (1.702 m).   Weight as of this encounter: 276 lb(125.193 kg).  Classification of overweight in adults according to BMI (WHO, 1998)   Procedure:  Procedure(s) (LRB): TOTAL KNEE REVISION (Right)   Consults: None  HPI: Jeffrey Wiggins is a 72 year old male with a complex  history in regards to his right knee. He has had a primary total knee  arthroplasty. Initially, he had a scar excision. He has had persistent  pain and stiffness. Bone scan showed probable  loosening. He presents  now for revision given the pain, stiffness, and probable loosening.  Laboratory Data: Admission on 09/23/2012  Component Date Value Range Status  . ABO/RH(D) 09/23/2012 A POS   Final  . Antibody Screen 09/23/2012 NEG   Final  . Sample Expiration 09/23/2012 09/26/2012   Final  . WBC 09/24/2012 9.3  4.0 - 10.5 K/uL Final  . RBC 09/24/2012 3.83* 4.22 - 5.81 MIL/uL Final  . Hemoglobin 09/24/2012 11.0* 13.0 - 17.0 g/dL Final  . HCT 30/86/5784 35.1* 39.0 - 52.0 % Final  . MCV 09/24/2012 91.6  78.0 - 100.0 fL Final  . MCH 09/24/2012 28.7  26.0 - 34.0 pg Final  . MCHC 09/24/2012 31.3  30.0 - 36.0 g/dL Final  . RDW 69/62/9528 15.0  11.5 - 15.5 % Final  . Platelets 09/24/2012 220  150 - 400 K/uL Final  . Sodium 09/24/2012 136  135 - 145 mEq/L Final  . Potassium 09/24/2012 4.0  3.5 - 5.1 mEq/L Final  . Chloride 09/24/2012 101  96 - 112 mEq/L Final  . CO2 09/24/2012 31  19 - 32 mEq/L Final  . Glucose, Bld 09/24/2012 152* 70 - 99 mg/dL Final  . BUN 41/32/4401 12  6 - 23 mg/dL Final  . Creatinine, Ser 09/24/2012 1.10  0.50 - 1.35 mg/dL Final  . Calcium 02/72/5366 8.6  8.4 - 10.5 mg/dL Final  . GFR calc non Af Amer 09/24/2012 66* >90 mL/min Final  . GFR calc Af Amer 09/24/2012 76* >90 mL/min Final   Comment:                                 The eGFR has been calculated                          using the CKD EPI equation.                          This calculation has not been                          validated in all clinical                          situations.                          eGFR's persistently                          <90 mL/min signify                          possible Chronic Kidney Disease.  . WBC 09/25/2012 12.1* 4.0 - 10.5 K/uL Final  . RBC 09/25/2012 3.79* 4.22 - 5.81 MIL/uL Final  . Hemoglobin 09/25/2012 10.9* 13.0 - 17.0 g/dL Final  . HCT 40/98/1191 34.7* 39.0 - 52.0 % Final  . MCV 09/25/2012 91.6  78.0 - 100.0 fL Final  . MCH 09/25/2012 28.8   26.0 - 34.0 pg Final  . MCHC 09/25/2012 31.4  30.0 - 36.0 g/dL Final  . RDW 47/82/9562 14.7  11.5 - 15.5 % Final  . Platelets 09/25/2012 212  150 - 400 K/uL Final  . Sodium 09/25/2012 139  135 - 145 mEq/L Final  . Potassium 09/25/2012 4.7  3.5 - 5.1 mEq/L Final  . Chloride 09/25/2012 102  96 - 112 mEq/L Final  . CO2 09/25/2012 32  19 - 32 mEq/L Final  . Glucose, Bld 09/25/2012 152* 70 - 99 mg/dL Final  . BUN 13/04/6577 11  6 - 23 mg/dL Final  . Creatinine, Ser 09/25/2012 0.89  0.50 - 1.35 mg/dL Final  . Calcium 46/96/2952 9.3  8.4 - 10.5 mg/dL Final  . GFR calc non Af Amer 09/25/2012 84* >90 mL/min Final  . GFR calc Af Amer 09/25/2012 >90  >90 mL/min Final   Comment:                                 The eGFR has been calculated                          using the CKD EPI equation.                          This calculation has not been  validated in all clinical                          situations.                          eGFR's persistently                          <90 mL/min signify                          possible Chronic Kidney Disease.  Hospital Outpatient Visit on 09/11/2012  Component Date Value Range Status  . aPTT 09/11/2012 31  24 - 37 seconds Final  . WBC 09/11/2012 8.5  4.0 - 10.5 K/uL Final  . RBC 09/11/2012 4.93  4.22 - 5.81 MIL/uL Final  . Hemoglobin 09/11/2012 14.2  13.0 - 17.0 g/dL Final  . HCT 81/19/1478 44.3  39.0 - 52.0 % Final  . MCV 09/11/2012 89.9  78.0 - 100.0 fL Final  . MCH 09/11/2012 28.8  26.0 - 34.0 pg Final  . MCHC 09/11/2012 32.1  30.0 - 36.0 g/dL Final  . RDW 29/56/2130 14.6  11.5 - 15.5 % Final  . Platelets 09/11/2012 254  150 - 400 K/uL Final  . Sodium 09/11/2012 140  135 - 145 mEq/L Final  . Potassium 09/11/2012 3.6  3.5 - 5.1 mEq/L Final  . Chloride 09/11/2012 102  96 - 112 mEq/L Final  . CO2 09/11/2012 30  19 - 32 mEq/L Final  . Glucose, Bld 09/11/2012 83  70 - 99 mg/dL Final  . BUN 86/57/8469 15  6 - 23 mg/dL Final    . Creatinine, Ser 09/11/2012 0.93  0.50 - 1.35 mg/dL Final  . Calcium 62/95/2841 9.2  8.4 - 10.5 mg/dL Final  . Total Protein 09/11/2012 6.8  6.0 - 8.3 g/dL Final  . Albumin 32/44/0102 3.5  3.5 - 5.2 g/dL Final  . AST 72/53/6644 52* 0 - 37 U/L Final  . ALT 09/11/2012 87* 0 - 53 U/L Final  . Alkaline Phosphatase 09/11/2012 47  39 - 117 U/L Final  . Total Bilirubin 09/11/2012 0.4  0.3 - 1.2 mg/dL Final  . GFR calc non Af Amer 09/11/2012 83* >90 mL/min Final  . GFR calc Af Amer 09/11/2012 >90  >90 mL/min Final   Comment:                                 The eGFR has been calculated                          using the CKD EPI equation.                          This calculation has not been                          validated in all clinical                          situations.                          eGFR's  persistently                          <90 mL/min signify                          possible Chronic Kidney Disease.  Marland Kitchen Prothrombin Time 09/11/2012 12.7  11.6 - 15.2 seconds Final  . INR 09/11/2012 0.96  0.00 - 1.49 Final  . Color, Urine 09/11/2012 YELLOW  YELLOW Final  . APPearance 09/11/2012 CLEAR  CLEAR Final  . Specific Gravity, Urine 09/11/2012 1.017  1.005 - 1.030 Final  . pH 09/11/2012 7.0  5.0 - 8.0 Final  . Glucose, UA 09/11/2012 NEGATIVE  NEGATIVE mg/dL Final  . Hgb urine dipstick 09/11/2012 NEGATIVE  NEGATIVE Final  . Bilirubin Urine 09/11/2012 NEGATIVE  NEGATIVE Final  . Ketones, ur 09/11/2012 NEGATIVE  NEGATIVE mg/dL Final  . Protein, ur 16/06/9603 NEGATIVE  NEGATIVE mg/dL Final  . Urobilinogen, UA 09/11/2012 1.0  0.0 - 1.0 mg/dL Final  . Nitrite 54/05/8118 NEGATIVE  NEGATIVE Final  . Leukocytes, UA 09/11/2012 NEGATIVE  NEGATIVE Final   MICROSCOPIC NOT DONE ON URINES WITH NEGATIVE PROTEIN, BLOOD, LEUKOCYTES, NITRITE, OR GLUCOSE <1000 mg/dL.  Marland Kitchen MRSA, PCR 09/11/2012 NEGATIVE  NEGATIVE Final  . Staphylococcus aureus 09/11/2012 NEGATIVE  NEGATIVE Final   Comment:                                  The Xpert SA Assay (FDA                          approved for NASAL specimens                          in patients over 88 years of age),                          is one component of                          a comprehensive surveillance                          program.  Test performance has                          been validated by [employer] for patients greater                          than or equal to 81 year old.                          It is not intended                          to diagnose infection nor to                          guide or monitor treatment.  X-Rays:No results found.  EKG: Orders placed during the hospital encounter of 09/11/12  . EKG 12-LEAD  . EKG 12-LEAD     Hospital Course: Jeffrey Wiggins is a 72 y.o. who was admitted to [facility]. They were brought to the operating room on 09/23/2012 and underwent Procedure(s): TOTAL KNEE REVISION.  Patient tolerated the procedure well and was later transferred to the recovery room and then to the orthopaedic floor for postoperative care.  They were given PO and IV analgesics for pain control following their surgery.  They were given 24 hours of postoperative antibiotics of  Anti-infectives     Start     Dose/Rate Route Frequency Ordered Stop   09/23/12 1700   ceFAZolin (ANCEF) IVPB 2 g/50 mL premix        2 g 100 mL/hr over 30 Minutes Intravenous Every 6 hours 09/23/12 1558 09/23/12 2336   09/23/12 0846   ceFAZolin (ANCEF) 3 g in dextrose 5 % 50 mL IVPB        3 g 160 mL/hr over 30 Minutes Intravenous 60 min pre-op 09/23/12 0846 09/23/12 1045         and started on DVT prophylaxis in the form of Xarelto.   PT and OT were ordered for total joint protocol.  Discharge planning consulted to help with postop disposition and equipment needs.  Patient had a decent night on the evening of surgery and started to get up OOB with therapy on day one and walked over 180  feet. Hemovac drain was pulled without difficulty.  Continued to work with therapy into day two.  Dressing was changed on day two and the incision was healing well. Patient was seen in rounds and was ready to go home later that same day after therapy.   Discharge Medications: Prior to Admission medications   Medication Sig Start Date End Date Taking? Authorizing Provider  acetaminophen (TYLENOL) 500 MG tablet Take 1,000 mg by mouth as needed. Pain   Yes Historical Provider, MD  atorvastatin (LIPITOR) 80 MG tablet Take 80 mg by mouth every evening.   Yes Historical Provider, MD  fluticasone (FLONASE) 50 MCG/ACT nasal spray Place 2 sprays into the nose daily.   Yes Historical Provider, MD  hydrochlorothiazide (HYDRODIURIL) 25 MG tablet Take 25 mg by mouth every morning.   Yes Historical Provider, MD  loratadine (CLARITIN) 10 MG tablet Take 10 mg by mouth daily.    Yes Historical Provider, MD  losartan (COZAAR) 100 MG tablet Take 100 mg by mouth every morning.   Yes Historical Provider, MD  omeprazole (PRILOSEC) 20 MG capsule Take 20 mg by mouth every morning.   Yes Historical Provider, MD  STUDY MEDICATION Take 1 tablet by mouth every evening. [employer] [facility] for cholesterol. Study medication Anacetrapib or placebo   Yes Historical Provider, MD  methocarbamol (ROBAXIN) 500 MG tablet Take 1 tablet (500 mg total) by mouth every 6 (six) hours as needed. 09/25/12   Alexzandrew Julien Girt, PA  nitroGLYCERIN (NITROSTAT) 0.4 MG SL tablet Place 1 tablet (0.4 mg total) under the tongue every 5 (five) minutes as needed. 03/05/12   Lewayne Bunting, MD  oxyCODONE (OXY IR/ROXICODONE) 5 MG immediate release tablet Take 1-4 tablets (5-20 mg total) by mouth every 3 (three) hours as needed. 09/25/12   Alexzandrew Julien Girt, PA  rivaroxaban (XARELTO) 10 MG TABS tablet Take 1 tablet (10 mg total) by mouth daily with breakfast. Take Xarelto for two and a half more weeks, then discontinue  Xarelto. Once the patient has  completed the Xarelto, they may resume the 81 mg Aspirin. 09/25/12   Alexzandrew Perkins, PA  Testosterone (AXIRON) 30 MG/ACT SOLN Place 1 application onto the skin daily. Underarm, alternating arms every other day.    Historical Provider, MD    Diet: Cardiac diet Activity:WBAT Follow-up:in 2 weeks Disposition - Home Discharged Condition: good   Discharge Orders    Future Appointments: Provider: Department: Dept Phone: Center:   10/22/2012 9:15 AM Joselyn Arrow, MD [facility] [phone] [facility]   02/16/2013 10:00 AM [facility] [phone] None     Future Orders Please Complete By Expires   Diet - low sodium heart healthy      Call MD / Call 911      Comments:   If you experience chest pain or shortness of breath, CALL 911 and be transported to the hospital emergency room.  If you develope a fever above 101 F, pus (white drainage) or increased drainage or redness at the wound, or calf pain, call your surgeon's office.   Discharge instructions      Comments:   Pick up stool softner and laxative for home. Do not submerge incision under water. May shower. Continue to use ice for pain and swelling from surgery.  Take Xarelto for two and a half more weeks, then discontinue Xarelto. Once the patient has completed the Xarelto, they may resume the 81 mg Aspirin.   Constipation Prevention      Comments:   Drink plenty of fluids.  Prune juice may be helpful.  You may use a stool softener, such as Colace (over the counter) 100 mg twice a day.  Use MiraLax (over the counter) for constipation as needed.   Increase activity slowly as tolerated      Patient may shower      Comments:   You may shower without a dressing once there is no drainage.  Do not wash over the wound.  If drainage remains, do not shower until drainage stops.   Weight bearing as tolerated      Driving restrictions      Comments:   No driving until released by  the physician.   Lifting restrictions      Comments:   No lifting until released by the physician.   TED hose      Comments:   Use stockings (TED hose) for 3 weeks on both leg(s).  You may remove them at night for sleeping.   Change dressing      Comments:   Change dressing daily with sterile 4 x 4 inch gauze dressing and apply TED hose. Do not submerge the incision under water.   Do not put a pillow under the knee. Place it under the heel.      Do not sit on low chairs, stoools or toilet seats, as it may be difficult to get up from low surfaces          Medication List     As of 09/25/2012  8:57 AM    STOP taking these medications         aspirin 81 MG tablet      CALCIUM-MAGNESIUM-ZINC PO      cholecalciferol 1000 UNITS tablet   Commonly known as: VITAMIN D      Coenzyme Q-10 100 MG capsule      multivitamin capsule      RESVERATROL PO      vitamin  E 400 UNIT capsule      TAKE these medications         acetaminophen 500 MG tablet   Commonly known as: TYLENOL   Take 1,000 mg by mouth as needed. Pain      atorvastatin 80 MG tablet   Commonly known as: LIPITOR   Take 80 mg by mouth every evening.      AXIRON 30 MG/ACT Soln   Generic drug: Testosterone   Place 1 application onto the skin daily. Underarm, alternating arms every other day.      fluticasone 50 MCG/ACT nasal spray   Commonly known as: FLONASE   Place 2 sprays into the nose daily.      hydrochlorothiazide 25 MG tablet   Commonly known as: HYDRODIURIL   Take 25 mg by mouth every morning.      loratadine 10 MG tablet   Commonly known as: CLARITIN   Take 10 mg by mouth daily.      losartan 100 MG tablet   Commonly known as: COZAAR   Take 100 mg by mouth every morning.      methocarbamol 500 MG tablet   Commonly known as: ROBAXIN   Take 1 tablet (500 mg total) by mouth every 6 (six) hours as needed.      nitroGLYCERIN 0.4 MG SL tablet   Commonly known as: NITROSTAT   Place 1 tablet (0.4 mg  total) under the tongue every 5 (five) minutes as needed.      omeprazole 20 MG capsule   Commonly known as: PRILOSEC   Take 20 mg by mouth every morning.      oxyCODONE 5 MG immediate release tablet   Commonly known as: Oxy IR/ROXICODONE   Take 1-4 tablets (5-20 mg total) by mouth every 3 (three) hours as needed.      rivaroxaban 10 MG Tabs tablet   Commonly known as: XARELTO   Take 1 tablet (10 mg total) by mouth daily with breakfast. Take Xarelto for two and a half more weeks, then discontinue Xarelto.  Once the patient has completed the Xarelto, they may resume the 81 mg Aspirin.      STUDY MEDICATION   Take 1 tablet by mouth every evening. [employer] [facility] for cholesterol. Study medication Anacetrapib or placebo           Follow-up Information    Follow up with Loanne Drilling, MD. Schedule an appointment as soon as possible for a visit on 10/08/2012.   Contact information:   [address], [address] [place] [address] [phone]          Signed: Patrica Duel 09/25/2012, 8:57 AM

## 2012-09-25 NOTE — Progress Notes (Signed)
Subjective: 2 Days Post-Op Procedure(s) (LRB): TOTAL KNEE REVISION (Right) Patient reports pain as mild.   Patient seen in rounds for Dr. Lequita Halt. Patient is well, and has had no acute complaints or problems Patient is ready to go home today.  Objective: Vital signs in last 24 hours: Temp:  [97.8 F (36.6 C)-99.3 F (37.4 C)] 97.8 F (36.6 C) (01/10 4782) Pulse Rate:  [86-94] 86  (01/10 0632) Resp:  [16] 16  (01/10 0632) BP: (112-138)/(67-74) 138/74 mmHg (01/10 0632) SpO2:  [97 %-98 %] 98 % (01/10 9562)  Intake/Output from previous day:  Intake/Output Summary (Last 24 hours) at 09/25/12 0850 Last data filed at 09/25/12 0757  Gross per 24 hour  Intake 1630.67 ml  Output   5250 ml  Net -3619.33 ml    Intake/Output this shift: Total I/O In: 240 [P.O.:240] Out: -   Labs:  Basename 09/25/12 0425 09/24/12 0427  HGB 10.9* 11.0*    Basename 09/25/12 0425 09/24/12 0427  WBC 12.1* 9.3  RBC 3.79* 3.83*  HCT 34.7* 35.1*  PLT 212 220    Basename 09/25/12 0425 09/24/12 0427  NA 139 136  K 4.7 4.0  CL 102 101  CO2 32 31  BUN 11 12  CREATININE 0.89 1.10  GLUCOSE 152* 152*  CALCIUM 9.3 8.6   No results found for this basename: LABPT:2,INR:2 in the last 72 hours  EXAM: General - Patient is Alert, Appropriate and Oriented Extremity - Neurovascular intact Sensation intact distally Dorsiflexion/Plantar flexion intact No cellulitis present Incision - clean, dry, no drainage, healing Motor Function - intact, moving foot and toes well on exam.   Assessment/Plan: 2 Days Post-Op Procedure(s) (LRB): TOTAL KNEE REVISION (Right) Procedure(s) (LRB): TOTAL KNEE REVISION (Right) Past Medical History  Diagnosis Date  . CAD (coronary artery disease)   . HTN (hypertension)   . Prostatitis   . Hyperlipidemia   . Obesity   . Diverticular disease   . Gallstones   . Hepatitis age 49  . Arthritis   . Sleep apnea     mild, CPAP wasn't indicated (per pt)  . Benign  prostatic hypertrophy     followed by Dr. Patsi Sears  . Cerebrovascular disease   . Sepsis due to Escherichia coli ? 2011    DUE TO URINARY OUTLET OBSTRUCTION /INFECTION  . UTI (urinary tract infection)     secondary  . Allergy     RHINITIS  . 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 Jens Som); s/p stents  . GERD (gastroesophageal reflux disease)   . Hypogonadism male     treated by Dr. Patsi Sears  . Erectile dysfunction   . Peripheral vascular disease     02/28/12 MOST RECENT CAROTID DUPLEX--"STABLE MILD CALCIFIED PLAQUE, BILATERALY, STABLE, OVER SERIAL EXAMS"  . 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 IFG (impaired fasting glucose)   . Pneumonia     hx of   Principal Problem:  *Failed total knee arthroplasty Active Problems:  Postop Acute blood loss anemia  Estimated Body mass index is 43.23 kg/(m^2) as calculated from the following:   Height as of this encounter: 5\' 7" (1.702 m).   Weight as of this encounter: 276 lb(125.193 kg). Up with therapy Discharge home with home health Diet - Cardiac diet Follow up - in 2 weeks Activity - WBAT Disposition - Home Condition Upon Discharge - Good D/C Meds - See DC Summary DVT Prophylaxis - Xarelto, 81 mg ASA on hold  Jeffrey Wiggins 09/25/2012, 8:50 AM

## 2012-09-25 NOTE — Care Management Note (Signed)
    Page 1 of 2   09/25/2012     1:18:19 PM   CARE MANAGEMENT NOTE 09/25/2012  Patient:  Jeffrey Wiggins, Jeffrey Wiggins   Account Number:  [id number]  Date Initiated:  09/24/2012  Documentation initiated by:  Colleen Can  Subjective/Objective Assessment:   dx revision right total knee arthroplasty     Action/Plan:   Home with hh services. Wants same therapist that he used in the past. Already has RW. Lives in [place].   Anticipated DC Date:  09/26/2012   Anticipated DC Plan:  HOME W HOME HEALTH SERVICES  In-house referral  NA      DC Planning Services  CM consult      [facility] Choice  HOME HEALTH   Choice offered to / List presented to:  C-1 Patient   DME arranged  NA      DME agency  NA     HH arranged  HH-2 PT      HH agency  [facility]   Status of service:  Completed, signed off Medicare Important Message given?  NO (If response is "NO", the following Medicare IM given date fields will be blank) Date Medicare IM given:   Date Additional Medicare IM given:    Discharge Disposition:    Per UR Regulation:  Reviewed for med. necessity/level of care/duration of stay  If discussed at Long Length of Stay Meetings, dates discussed:    Comments:  09/25/2012 Colleen Can BSN RN CCM [phone] [facility] notified and can provide HHpt services for patient with start date of day after dischargew. Discharge depends on progression of physical therapy today.

## 2012-09-25 NOTE — Progress Notes (Signed)
Physical Therapy Treatment Patient Details Name: Jeffrey Wiggins MRN: [id number] DOB: 01-03-1941 Today's Date: 09/25/2012 Time: 4098-1191 PT Time Calculation (min): 42 min  PT Assessment / Plan / Recommendation Comments on Treatment Session  Reviewed ambulation, stairs, exercises. Issued exercise handout. Instructed pt to perform exercises one more time this evening. All education completed. Ready for d/c.     Follow Up Recommendations  Home health PT     Does the patient have the potential to tolerate intense rehabilitation     Barriers to Discharge        Equipment Recommendations  None recommended by PT    Recommendations for Other Services OT consult  Frequency 7X/week   Plan Discharge plan remains appropriate    Precautions / Restrictions Precautions Precautions: Knee Required Braces or Orthoses: Knee Immobilizer - Right (KI Dc'd on 09/25/12-able to SLR) Knee Immobilizer - Right: Discontinue once straight leg raise with < 10 degree lag Restrictions Weight Bearing Restrictions: No RLE Weight Bearing: Weight bearing as tolerated   Pertinent Vitals/Pain 4/10 R knee    Mobility  Bed Mobility Bed Mobility: Supine to Sit Supine to Sit: 4: Min assist Details for Bed Mobility Assistance: Assist for R LE off bed.  Transfers Transfers: Sit to Stand;Stand to Sit Sit to Stand: 4: Min guard;From bed Stand to Sit: 4: Min guard;To chair/3-in-1 Details for Transfer Assistance: VCs safety, technique, hand placement. Ambulation/Gait Ambulation/Gait Assistance: 4: Min guard Ambulation Distance (Feet): 200 Feet Assistive device: Rolling walker Gait Pattern: Step-through pattern;Decreased stride length;Antalgic Stairs: Yes Stairs Assistance: 4: Min guard Stairs Assistance Details (indicate cue type and reason): VCs safety, technique, sequence.  Stair Management Technique: One rail Left;With crutch;Forwards Number of Stairs: 4     Exercises Total Joint Exercises Ankle  Circles/Pumps: AROM;Both;15 reps;Supine Quad Sets: AROM;Both;15 reps;Supine Heel Slides: AAROM;Right;15 reps;Supine Hip ABduction/ADduction: AAROM;Right;15 reps;Supine Straight Leg Raises: AAROM;Right;15 reps;Supine   PT Diagnosis:    PT Problem List:   PT Treatment Interventions:     PT Goals Acute Rehab PT Goals Pt will go Supine/Side to Sit: with supervision PT Goal: Supine/Side to Sit - Progress: Progressing toward goal Pt will go Sit to Stand: with supervision PT Goal: Sit to Stand - Progress: Progressing toward goal Pt will Ambulate: 51 - 150 feet;with supervision;with rolling walker PT Goal: Ambulate - Progress: Progressing toward goal Pt will Go Up / Down Stairs: 3-5 stairs;with least restrictive assistive device;with rail(s);with supervision PT Goal: Up/Down Stairs - Progress: Progressing toward goal Pt will Perform Home Exercise Program: with supervision, verbal cues required/provided PT Goal: Perform Home Exercise Program - Progress: Progressing toward goal  Visit Information  Last PT Received On: 09/25/12 Assistance Needed: +1    Subjective Data  Subjective: "I just hope everything goes well this time" Patient Stated Goal: home. Regain ROM R knee   Cognition  Overall Cognitive Status: Appears within functional limits for tasks assessed/performed Arousal/Alertness: Awake/alert Orientation Level: Appears intact for tasks assessed Behavior During Session: [facility] for tasks performed    Balance     End of Session PT - End of Session Equipment Utilized During Treatment: Gait belt Activity Tolerance: Patient tolerated treatment well Patient left: in chair;with call bell/phone within reach CPM Right Knee CPM Right Knee: Off   GP     Rebeca Alert [facility] 09/25/2012, 11:23 AM [phone]

## 2012-10-22 ENCOUNTER — Ambulatory Visit: Payer: Medicare Other | Admitting: Family Medicine

## 2012-11-19 ENCOUNTER — Ambulatory Visit: Payer: Medicare Other | Admitting: Family Medicine

## 2012-12-02 ENCOUNTER — Encounter: Payer: Self-pay | Admitting: Family Medicine

## 2012-12-02 ENCOUNTER — Ambulatory Visit (INDEPENDENT_AMBULATORY_CARE_PROVIDER_SITE_OTHER): Payer: 59 | Admitting: Family Medicine

## 2012-12-02 VITALS — BP 128/84 | HR 80 | Ht 67.0 in | Wt 276.0 lb

## 2012-12-02 DIAGNOSIS — E669 Obesity, unspecified: Secondary | ICD-10-CM

## 2012-12-02 DIAGNOSIS — E785 Hyperlipidemia, unspecified: Secondary | ICD-10-CM

## 2012-12-02 DIAGNOSIS — I2510 Atherosclerotic heart disease of native coronary artery without angina pectoris: Secondary | ICD-10-CM

## 2012-12-02 DIAGNOSIS — R7301 Impaired fasting glucose: Secondary | ICD-10-CM

## 2012-12-02 LAB — LIPID PANEL
HDL: 48 mg/dL (ref >39)
LDL Cholesterol: 63 mg/dL (ref 0–99)
Total CHOL/HDL Ratio: 2.8 Ratio

## 2012-12-02 LAB — HEPATIC FUNCTION PANEL
Alkaline Phosphatase: 52 U/L (ref 39–117)
Indirect Bilirubin: 0.4 mg/dL (ref 0.0–0.9)
Total Bilirubin: 0.5 mg/dL (ref 0.3–1.2)

## 2012-12-02 LAB — TSH: TSH: 1.812 u[IU]/mL (ref 0.350–4.500)

## 2012-12-02 LAB — POCT GLYCOSYLATED HEMOGLOBIN (HGB A1C): Hemoglobin A1C: 6.2

## 2012-12-02 NOTE — Progress Notes (Signed)
Chief Complaint  Patient presents with  . Follow-up    follow up on weight, sugar and bp.   Patient presents for follow up on elevated sugars/impaired fasting glucose.  He has tried to improve portion control.  Currently wife is in [place], and he is cooking for himself.  He had R total knee revision in January, and getting better daily. Getting PT 3x/week.  He has lost just 2 pounds since November.   Hewould like to get Zostavax.  He tried to get it at the pharmacy, but since he has [place] and not Medicare, they stated he needed to get from PCP's office.    Lipids monitored by study, not by Dr. Jens Som or myself in the past.  Last LFT's in December through hospital were elevated, but he reports the study monitors q6 months, last was 3 months ago.  No med changes made.  He doesn't believe that anyone else has checked lipids in quite a while.  CAD--stable, doing well without problems.  Past Medical History  Diagnosis Date  . CAD (coronary artery disease)   . HTN (hypertension)   . Prostatitis   . Hyperlipidemia   . Obesity   . Diverticular disease   . Gallstones   . Hepatitis age 40  . Arthritis   . Sleep apnea     mild, CPAP wasn't indicated (per pt)  . Benign prostatic hypertrophy     followed by Dr. Patsi Sears  . Cerebrovascular disease   . Sepsis due to Escherichia coli ? 2011    DUE TO URINARY OUTLET OBSTRUCTION /INFECTION  . UTI (urinary tract infection)     secondary  . Allergy     RHINITIS  . 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 Jens Som); s/p stents  . GERD (gastroesophageal reflux disease)   . Hypogonadism male     treated by Dr. Patsi Sears  . Erectile dysfunction   . Peripheral vascular disease     02/28/12 MOST RECENT CAROTID DUPLEX--"STABLE MILD CALCIFIED PLAQUE, BILATERALY, STABLE, OVER SERIAL EXAMS"  . 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 IFG (impaired fasting glucose)   . Pneumonia     hx of   Past Surgical History  Procedure Laterality  Date  . Total knee arthroplasty  L 4/06, R 4/07  . Vasectomy    . Tonsillectomy    . Transurethral resection of prostate  06/2010  . Knee surgery  05/2010    removing scar tissue R knee (Dr. Lequita Halt)  . Coronary angioplasty with stent placement  01/1997    PTCA/stent to RCA  AND 2009 2ND STENT PLACED  . Joint replacement      both knees  . Total knee revision  09/23/2012    Procedure: TOTAL KNEE REVISION;  Surgeon: Loanne Drilling, MD;  Location: [facility];  Service: Orthopedics;  Laterality: Right;   History   Social History  . Marital Status: Married    Spouse Name: N/A    Number of Children: 4  . Years of Education: N/A   Occupational History  . CPA    Social History Main Topics  . Smoking status: Former Smoker -- 1.50 packs/day for 30 years    Types: Cigarettes    Quit date: 09/16/1986  . Smokeless tobacco: Never Used     Comment: 35 pack year history, none in 18 years  . Alcohol Use: Yes     Comment: 1 glass of wine twice a week (previously  daily)  . Drug Use: No  . Sexually Active: Not on file   Other Topics Concern  . Not on file   Social History Narrative  . No narrative on file   Current Outpatient Prescriptions on File Prior to Visit  Medication Sig Dispense Refill  . acetaminophen (TYLENOL) 500 MG tablet Take 1,000 mg by mouth as needed. Pain      . atorvastatin (LIPITOR) 80 MG tablet Take 80 mg by mouth every evening.      . fluticasone (FLONASE) 50 MCG/ACT nasal spray Place 2 sprays into the nose daily.      . hydrochlorothiazide (HYDRODIURIL) 25 MG tablet Take 25 mg by mouth every morning.      . loratadine (CLARITIN) 10 MG tablet Take 10 mg by mouth daily.       Marland Kitchen losartan (COZAAR) 100 MG tablet Take 100 mg by mouth every morning.      . methocarbamol (ROBAXIN) 500 MG tablet Take 1 tablet (500 mg total) by mouth every 6 (six) hours as needed.  80 tablet  0  . omeprazole (PRILOSEC) 20 MG capsule Take 20 mg by mouth every morning.      Marland Kitchen oxyCODONE (OXY  IR/ROXICODONE) 5 MG immediate release tablet Take 1-4 tablets (5-20 mg total) by mouth every 3 (three) hours as needed.  90 tablet  0  . STUDY MEDICATION Take 1 tablet by mouth every evening. [facility] for cholesterol. Study medication Anacetrapib or placebo      . nitroGLYCERIN (NITROSTAT) 0.4 MG SL tablet Place 1 tablet (0.4 mg total) under the tongue every 5 (five) minutes as needed.  25 tablet  12  . Testosterone (AXIRON) 30 MG/ACT SOLN Place 1 application onto the skin daily. Underarm, alternating arms every other day.       No current facility-administered medications on file prior to visit.   No Known Allergies  ROS: denies headache, dizziness, chest pain, skin rashes, GI complaints.  Knee pain is improved.  No other complaints.  PHYSICAL EXAM: BP 128/84  Pulse 80  Ht 5\' 7"  (1.702 m)  Wt 276 lb (125.193 kg)  BMI 43.22 kg/m2 Pleasant, obese male in no distress Heart: regular rate and rhythm Lungs: clear bilaterally Abdomen: obese, soft, nontender, no mass Extremities: no edema Psych: normal mood, affect, hygiene and grooming  Lab Results  Component Value Date   HGBA1C 6.2 12/02/2012   ASSESSMENT/PLAN:  Impaired fasting glucose - Plan: HgB A1c, TSH, Glucose, random  OBESITY  HYPERLIPIDEMIA - Plan: Hepatic function panel, Lipid panel, TSH  CAD  Impaired fasting glucose--Reviewed diet in detail (low carb, healthy, portion size, weight watchers vs journaling vs calorie counting), importance of weight loss, continued exercise. Offered referral to dietician--will let us know if he is interested.  CAD and hyperlipidemia--stable; on study drug.  Check lipids to see where he is at (?if at goal), and also to recheck LFT's since was elevated on last check  zostavax--counseled re: risks/benefits, potential side effects. Recommended getting prior to close contact with newborn later this Spring.  Unfortunately, isn't available in office today--will put on list to call once we  receive new shipment.  F/u in 6 months for CPE (prostate checked by urologist; testicular exams NOT done by them)  25 minute visit, more than 1/2 spent counseling

## 2012-12-02 NOTE — Patient Instructions (Addendum)
Continue to work on [employer] loss--dietary intake, portions and continue your exercise program. Let me know if you are interested in referral to dietician  Suzette Battiest will call you as soon as the shingles vaccine comes in, to get you on the schedule for a nurse visit to receive vaccine.  Sorry we didn't have it today!

## 2012-12-04 ENCOUNTER — Other Ambulatory Visit (INDEPENDENT_AMBULATORY_CARE_PROVIDER_SITE_OTHER): Payer: 59

## 2012-12-04 DIAGNOSIS — Z23 Encounter for immunization: Secondary | ICD-10-CM

## 2012-12-04 DIAGNOSIS — Z2911 Encounter for prophylactic immunotherapy for respiratory syncytial virus (RSV): Secondary | ICD-10-CM

## 2012-12-04 NOTE — Progress Notes (Signed)
PATIENT CAME INTO TODAY FOR A NURSE VISIT FOR THE SHINGLES VACCINE. I ASK THE PATIENT UP FRONT HAD HE CONTACTED HIS INSURANCE COMPANY TO MAKE SURE THEY WOULD COVER THE COST. HE STATES NO HE DID NOT BECAUSE HE WANTS TO VACCINE AND HE WILL PAY IF THEY DO NOT COVER THE COST. CLS

## 2013-01-29 ENCOUNTER — Encounter: Payer: Self-pay | Admitting: Cardiology

## 2013-01-29 ENCOUNTER — Ambulatory Visit (INDEPENDENT_AMBULATORY_CARE_PROVIDER_SITE_OTHER): Payer: 59 | Admitting: Cardiology

## 2013-01-29 VITALS — BP 120/80 | HR 83 | Wt 279.0 lb

## 2013-01-29 DIAGNOSIS — I2510 Atherosclerotic heart disease of native coronary artery without angina pectoris: Secondary | ICD-10-CM

## 2013-01-29 DIAGNOSIS — E785 Hyperlipidemia, unspecified: Secondary | ICD-10-CM

## 2013-01-29 DIAGNOSIS — I10 Essential (primary) hypertension: Secondary | ICD-10-CM

## 2013-01-29 NOTE — Assessment & Plan Note (Signed)
Continue aspirin and statin. 

## 2013-01-29 NOTE — Patient Instructions (Addendum)
Your physician wants you to follow-up in: ONE YEAR WITH DR CRENSHAW You will receive a reminder letter in the mail two months in advance. If you don't receive a letter, please call our office to schedule the follow-up appointment.  

## 2013-01-29 NOTE — Progress Notes (Signed)
HPI: The patient is a very pleasant gentleman who has a history of coronary artery disease status post coronary bypassing graft in January 2009. Note at that time, he had a LIMA to the LAD, free RIMA to the obtuse marginal, and sequential saphenous vein graft to the first and second diagonals, as well as saphenous vein graft to the PDA. Myoview in October of 2012 showed an ejection fraction of 52%. Prior inferior infarct and mild peri-infarct ischemia. We have treated medically. Carotid Dopplers in June of 2013 showed a 40-59% right and 0-39% left stenosis. Followup recommended in 2 years. Since I last saw in Sept 2012 the patient denies any dyspnea on exertion, orthopnea, PND, pedal edema, palpitations, syncope or chest pain.   Current Outpatient Prescriptions  Medication Sig Dispense Refill  . acetaminophen (TYLENOL) 500 MG tablet Take 1,000 mg by mouth as needed. Pain      . aspirin 81 MG tablet Take 81 mg by mouth daily.      Marland Kitchen atorvastatin (LIPITOR) 80 MG tablet Take 80 mg by mouth every evening.      Marland Kitchen CALCIUM-MAGNESIUM-ZINC PO Take 1 each by mouth daily.      Marland Kitchen co-enzyme Q-10 30 MG capsule Take 30 mg by mouth 3 (three) times daily.      . fluticasone (FLONASE) 50 MCG/ACT nasal spray Place 2 sprays into the nose daily.      . hydrochlorothiazide (HYDRODIURIL) 25 MG tablet Take 25 mg by mouth every morning.      . loratadine (CLARITIN) 10 MG tablet Take 10 mg by mouth daily.       Marland Kitchen losartan (COZAAR) 100 MG tablet Take 100 mg by mouth every morning.      . methocarbamol (ROBAXIN) 500 MG tablet Take 1 tablet (500 mg total) by mouth every 6 (six) hours as needed.  80 tablet  0  . nitroGLYCERIN (NITROSTAT) 0.4 MG SL tablet Place 1 tablet (0.4 mg total) under the tongue every 5 (five) minutes as needed.  25 tablet  12  . omeprazole (PRILOSEC) 20 MG capsule Take 20 mg by mouth every morning.      Marland Kitchen oxyCODONE (OXY IR/ROXICODONE) 5 MG immediate release tablet Take 1-4 tablets (5-20 mg total) by  mouth every 3 (three) hours as needed.  90 tablet  0  . STUDY MEDICATION Take 1 tablet by mouth every evening. [facility] for cholesterol. Study medication Anacetrapib or placebo      . Testosterone (AXIRON) 30 MG/ACT SOLN Place 1 application onto the skin daily. Underarm, alternating arms every other day.       No current facility-administered medications for this visit.     Past Medical History  Diagnosis Date  . CAD (coronary artery disease)   . HTN (hypertension)   . Prostatitis   . Hyperlipidemia   . Obesity   . Diverticular disease   . Gallstones   . Hepatitis age 11  . Arthritis   . Sleep apnea     mild, CPAP wasn't indicated (per pt)  . Benign prostatic hypertrophy     followed by Dr. Patsi Sears  . Cerebrovascular disease   . Sepsis due to Escherichia coli ? 2011    DUE TO URINARY OUTLET OBSTRUCTION /INFECTION  . UTI (urinary tract infection)     secondary  . Allergy     RHINITIS  . 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 Jens Som); s/p stents  . GERD (gastroesophageal reflux disease)   . Hypogonadism  male     treated by Dr. Patsi Sears  . Erectile dysfunction   . Peripheral vascular disease     02/28/12 MOST RECENT CAROTID DUPLEX--"STABLE MILD CALCIFIED PLAQUE, BILATERALY, STABLE, OVER SERIAL EXAMS"  . 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 IFG (impaired fasting glucose)   . Pneumonia     hx of    Past Surgical History  Procedure Laterality Date  . Total knee arthroplasty  L 4/06, R 4/07  . Vasectomy    . Tonsillectomy    . Transurethral resection of prostate  06/2010  . Knee surgery  05/2010    removing scar tissue R knee (Dr. Lequita Halt)  . Coronary angioplasty with stent placement  01/1997    PTCA/stent to RCA  AND 2009 2ND STENT PLACED  . Joint replacement      both knees  . Total knee revision  09/23/2012    Procedure: TOTAL KNEE REVISION;  Surgeon: Loanne Drilling, MD;  Location: [facility];  Service: Orthopedics;  Laterality: Right;     History   Social History  . Marital Status: Married    Spouse Name: N/A    Number of Children: 4  . Years of Education: N/A   Occupational History  . CPA    Social History Main Topics  . Smoking status: Former Smoker -- 1.50 packs/day for 30 years    Types: Cigarettes    Quit date: 09/16/1986  . Smokeless tobacco: Never Used     Comment: 35 pack year history, none in 18 years  . Alcohol Use: Yes     Comment: 1 glass of wine twice a week (previously daily)  . Drug Use: No  . Sexually Active: Not on file   Other Topics Concern  . Not on file   Social History Narrative  . No narrative on file    ROS: some right knee pain but no fevers or chills, productive cough, hemoptysis, dysphasia, odynophagia, melena, hematochezia, dysuria, hematuria, rash, seizure activity, orthopnea, PND, pedal edema, claudication. Remaining systems are negative.  Physical Exam: Well-developed obese in no acute distress.  Skin is warm and dry.  HEENT is normal.  Neck is supple.  Chest is clear to auscultation with normal expansion.  Cardiovascular exam is regular rate and rhythm.  Abdominal exam nontender or distended. No masses palpated. Extremities show no edema. neuro grossly intact  ECG sinus rhythm at a rate of 83. Inferior infarct. Nonspecific ST changes.

## 2013-01-29 NOTE — Assessment & Plan Note (Signed)
Continue present blood pressure medications. Potassium and renal function monitored by primary care. 

## 2013-01-29 NOTE — Assessment & Plan Note (Signed)
Continue aspirin and statin. Followup carotid Dopplers June 2015. 

## 2013-01-29 NOTE — Assessment & Plan Note (Signed)
Continue statin. Lipids and liver monitored by primary care. 

## 2013-05-20 ENCOUNTER — Other Ambulatory Visit: Payer: Self-pay | Admitting: Cardiology

## 2013-05-20 ENCOUNTER — Other Ambulatory Visit: Payer: Self-pay | Admitting: Family Medicine

## 2013-05-24 ENCOUNTER — Telehealth: Payer: Self-pay | Admitting: Family Medicine

## 2013-05-24 MED ORDER — FLUTICASONE PROPIONATE 50 MCG/ACT NA SUSP: 2.0000 | Freq: Every day | NASAL | Status: DC

## 2013-05-24 NOTE — Telephone Encounter (Signed)
Please call his generic flonase Rx  He received his refill over the weekend BUT it was only for 30 day supply and he is wanting to know why we gave 30 day Rx vs 90 day since this is mail order

## 2013-05-24 NOTE — Telephone Encounter (Signed)
Spoke with patient and fixed rx.

## 2013-05-27 ENCOUNTER — Ambulatory Visit (INDEPENDENT_AMBULATORY_CARE_PROVIDER_SITE_OTHER): Payer: 59 | Admitting: Family Medicine

## 2013-05-27 ENCOUNTER — Encounter: Payer: 59 | Admitting: Family Medicine

## 2013-05-27 ENCOUNTER — Encounter: Payer: Self-pay | Admitting: Family Medicine

## 2013-05-27 VITALS — BP 122/80 | HR 64 | Ht 67.0 in | Wt 279.0 lb

## 2013-05-27 DIAGNOSIS — G4710 Hypersomnia, unspecified: Secondary | ICD-10-CM

## 2013-05-27 DIAGNOSIS — I10 Essential (primary) hypertension: Secondary | ICD-10-CM

## 2013-05-27 DIAGNOSIS — Z0000 Encounter for general adult medical examination without abnormal findings: Secondary | ICD-10-CM

## 2013-05-27 DIAGNOSIS — R400 Somnolence: Secondary | ICD-10-CM

## 2013-05-27 DIAGNOSIS — K219 Gastro-esophageal reflux disease without esophagitis: Secondary | ICD-10-CM

## 2013-05-27 DIAGNOSIS — R0683 Snoring: Secondary | ICD-10-CM

## 2013-05-27 DIAGNOSIS — L570 Actinic keratosis: Secondary | ICD-10-CM

## 2013-05-27 DIAGNOSIS — R0609 Other forms of dyspnea: Secondary | ICD-10-CM

## 2013-05-27 DIAGNOSIS — R0989 Other specified symptoms and signs involving the circulatory and respiratory systems: Secondary | ICD-10-CM

## 2013-05-27 DIAGNOSIS — E291 Testicular hypofunction: Secondary | ICD-10-CM

## 2013-05-27 DIAGNOSIS — R7301 Impaired fasting glucose: Secondary | ICD-10-CM

## 2013-05-27 DIAGNOSIS — Z6841 Body Mass Index (BMI) 40.0 and over, adult: Secondary | ICD-10-CM

## 2013-05-27 LAB — HEMOGLOBIN A1C
Hgb A1c MFr Bld: 6.7 % — ABNORMAL HIGH (ref <5.7)
Mean Plasma Glucose: 146 mg/dL — ABNORMAL HIGH (ref <117)

## 2013-05-27 LAB — POCT URINALYSIS DIPSTICK
Blood, UA: NEGATIVE
Glucose, UA: NEGATIVE
Leukocytes, UA: NEGATIVE
Protein, UA: NEGATIVE

## 2013-05-27 LAB — GLUCOSE, RANDOM: Glucose, Bld: 103 mg/dL — ABNORMAL HIGH (ref 70–99)

## 2013-05-27 NOTE — Progress Notes (Signed)
Chief Complaint  Patient presents with  . Annual Exam    fasting annual exam. Had R knee revision, feels he hasn'y fully recovered. Having some memory loss(slight). Mother had dementia and he is conscious of this. Does not have a sex life and it bothers him. Sees Dr.Tannenbaum for prostate issues, does not help with his sexual dysfunction.   Jeffrey Wiggins is a 72 y.o. male who presents for a complete physical.  He has the following concerns:  Hypogonadism--was on testosterone replacement per Dr. Patsi Sears in the past.  He stopped using it 2-3 months ago, due to concerns he saw on TV re: risks.  He also never really noticed a significant benefit; also admits to not being very compliant with remembering to take it.  He has problems both with libido, and ED, but biggest issue is a disinterested wife.  Oral meds weren't effective in treating ED, and had been rx'd injections by Dr. Patsi Sears.  Sleep apnea--still snoring per wife.  Wife hasn't commented on any apnea.  He wakes up feeling refreshed.  Has daytime somnolence, usually around 3pm; feels refreshed after a short nap.  Sleep study was when he was in his 30's, when he weighed a lot less.  Hypertension/CAD: BP's at home are running 120-125/70-80. Denies headaches, dizziness, chest pain.  Occasional swelling in feet.  Takes Mg/zinc/Ca supplement because he sometimes gets cramps in his legs when exercising, and due to being on longterm PPI.  GERD--controlled with meds. Hasn't missed meds for more than one day, no recurrent heartburn.  Denies dysphagia.  He coughs much less since taking prilosec daily.  Dr. Patsi Sears --sees yearly for followup on his prostate, and in the past for hypogonadism.  He does not check testicles, just the prostate.  Dr. Reinaldo Berber CAD. He is involved in a lipid study, so lipids are checked through that study. He states that liver and lipids are checked at least q6 months, and doesn't need to be checked  elsewhere.   Health Maintenance: Immunization History  Administered Date(s) Administered  . Influenza Split 06/14/2011, 07/07/2012  . Pneumococcal Conjugate 08/20/2007  . Td 08/16/2002  . Tdap 03/16/2012  . Zoster 12/04/2012   Last colonoscopy: 2010  Last PSA: per Dr. Patsi Sears, yearly  Dentist: twice yearly  Ophtho: once or twice a year  Exercise: 3-4 times/week, 45--60 mins of cardio and weights  Past Medical History  Diagnosis Date  . CAD (coronary artery disease)   . HTN (hypertension)   . Prostatitis   . Hyperlipidemia   . Obesity   . Diverticular disease   . Gallstones   . Hepatitis age 49  . Arthritis   . Sleep apnea     mild, CPAP wasn't indicated (per pt)  . Benign prostatic hypertrophy     followed by Dr. Patsi Sears  . Cerebrovascular disease   . Sepsis due to Escherichia coli ? 2011    DUE TO URINARY OUTLET OBSTRUCTION /INFECTION  . UTI (urinary tract infection)     secondary  . Allergy     RHINITIS  . 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 Jens Som); s/p stents  . GERD (gastroesophageal reflux disease)   . Hypogonadism male     treated by Dr. Patsi Sears  . Erectile dysfunction   . Peripheral vascular disease     02/28/12 MOST RECENT CAROTID DUPLEX--"STABLE MILD CALCIFIED PLAQUE, BILATERALY, STABLE, OVER SERIAL EXAMS"  . 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 IFG (  impaired fasting glucose)   . Pneumonia     hx of    Past Surgical History  Procedure Laterality Date  . Total knee arthroplasty  L 4/06, R 4/07  . Vasectomy    . Tonsillectomy    . Transurethral resection of prostate  06/2010  . Knee surgery  05/2010    removing scar tissue R knee (Dr. Lequita Halt)  . Coronary angioplasty with stent placement  01/1997    PTCA/stent to RCA  AND 2009 2ND STENT PLACED  . Joint replacement      both knees  . Total knee revision  09/23/2012    Procedure: TOTAL KNEE REVISION;  Surgeon: Loanne Drilling, MD;  Location: [facility];  Service:  Orthopedics;  Laterality: Right;    History   Social History  . Marital Status: Married    Spouse Name: N/A    Number of Children: 4  . Years of Education: N/A   Occupational History  . CPA    Social History Main Topics  . Smoking status: Former Smoker -- 1.50 packs/day for 30 years    Types: Cigarettes    Quit date: 09/16/1986  . Smokeless tobacco: Never Used     Comment: 35 pack year history, none in 18 years  . Alcohol Use: Yes     Comment: 1 glass of wine twice a week (previously daily)  . Drug Use: No  . Sexual Activity: Not Currently   Other Topics Concern  . Not on file   Social History Narrative   Lives with wife.  4 kids, 1 stepchild (1 in [place], 4 in [place]). No pets. Wife is a diabetic    Family History  Problem Relation Age of Onset  . Coronary artery disease Mother     deceased  . Dementia Mother   . Atrial fibrillation Mother   . Stroke Mother     related to atrial fib  . Coronary artery disease Father     deceased  . Hypertension Father   . Heart disease Father   . Cancer Paternal Uncle     colon; died in his 108's  . Colon cancer Paternal Uncle   . Hypertension Paternal Grandmother   . Heart disease Paternal Grandmother   . Hypertension Paternal Grandfather   . Heart disease Paternal Grandfather   . Diabetes Neg Hx     Current outpatient prescriptions:acetaminophen (TYLENOL) 500 MG tablet, Take 1,000 mg by mouth as needed. Pain, Disp: , Rfl: ;  aspirin 81 MG tablet, Take 81 mg by mouth daily., Disp: , Rfl: ;  atorvastatin (LIPITOR) 80 MG tablet, Take 80 mg by mouth every evening., Disp: , Rfl: ;  CALCIUM-MAGNESIUM-ZINC PO, Take 1 each by mouth daily., Disp: , Rfl: ;  Cholecalciferol (VITAMIN D) 2000 UNITS tablet, Take 2,000 Units by mouth daily., Disp: , Rfl:  co-enzyme Q-10 30 MG capsule, Take 30 mg by mouth 3 (three) times daily., Disp: , Rfl: ;  Cyanocobalamin (VITAMIN B12 PO), Take 5,000 mcg by mouth daily., Disp: , Rfl: ;  fluticasone (FLONASE) 50  MCG/ACT nasal spray, Place 2 sprays into the nose daily., Disp: 48 g, Rfl: 1;  hydrochlorothiazide (HYDRODIURIL) 25 MG tablet, Take 25 mg by mouth every morning., Disp: , Rfl: ;  loratadine (CLARITIN) 10 MG tablet, Take 10 mg by mouth daily. , Disp: , Rfl:  losartan (COZAAR) 100 MG tablet, Take 100 mg by mouth every morning., Disp: , Rfl: ;  omeprazole (PRILOSEC) 20 MG capsule, Take 20  mg by mouth every morning., Disp: , Rfl: ;  STUDY MEDICATION, Take 1 tablet by mouth every evening. [facility] for cholesterol. Study medication Anacetrapib or placebo, Disp: , Rfl: ;  vitamin E 100 UNIT capsule, Take 100 Units by mouth daily., Disp: , Rfl:  methocarbamol (ROBAXIN) 500 MG tablet, Take 1 tablet (500 mg total) by mouth every 6 (six) hours as needed., Disp: 80 tablet, Rfl: 0;  nitroGLYCERIN (NITROSTAT) 0.4 MG SL tablet, Place 1 tablet (0.4 mg total) under the tongue every 5 (five) minutes as needed., Disp: 25 tablet, Rfl: 12;  Testosterone (AXIRON) 30 MG/ACT SOLN, Place 1 application onto the skin daily. Underarm, alternating arms every other day., Disp: , Rfl:   No Known Allergies  ROS: The patient denies anorexia, fever, headaches, vision loss, ear pain, hoarseness, chest pain, palpitations, dizziness, syncope, cough (some slight cough from PND, improved overall since treating reflux); denies nausea, vomiting, diarrhea, constipation, abdominal pain, melena, hematochezia, hematuria, incontinence, dysuria, genital lesions, numbness, tingling, weakness, tremor, suspicious skin lesions, depression, anxiety, abnormal bleeding/bruising, or enlarged lymph nodes.  Sometimes his feet/ankles will swell. +hearing loss, wears hearing aides.  Up twice a night to void, depending on fluid intake. Not in a sexual relationship with wife x >5 years, mainly due to her libido issues.  Patient also has ED.  Some ongoing pain in R knee (s/p knee revision in January).  Only mild pain in left knee.  Some mild back pain the day  after doing weights at gym.  PHYSICAL EXAM: BP 122/80  Pulse 64  Ht 5\' 7"  (1.702 m)  Wt 279 lb (126.554 kg)  BMI 43.69 kg/m2  General Appearance:  Alert, cooperative, no distress, appears stated age, obese   Head:  Normocephalic, without obvious abnormality, atraumatic   Eyes:  PERRL, conjunctiva/corneas clear, EOM's intact, fundi  benign   Ears:  TM's and EAC's normal bilaterally.  +hearing aides bilaterally   Nose:  Nares normal, mucosa mildly edematous, no erythema or purulence, no drainage or sinus tenderness   Throat:  Lips, mucosa, and tongue normal; teeth and gums normal   Neck:  Supple, no lymphadenopathy; thyroid: no enlargement/tenderness/nodules; no carotid  bruit or JVD   Back:  Spine nontender, no curvature, ROM normal, no CVA tenderness   Lungs:  Clear to auscultation bilaterally without wheezes, rales or ronchi; respirations unlabored   Chest Wall:  No tenderness or deformity   Heart:  Regular rate and rhythm, S1 and S2 normal, no murmur, rub  or gallop. Occasional ectopy noted (skipped and extra beats)   Breast Exam:  No chest wall tenderness, or masses   Abdomen:  Soft, non-tender, nondistended, normoactive bowel sounds,  no masses, no hepatosplenomegaly. +ventral hernia, nontender, reducible  Genitalia:  Testicles descended bilaterally without masses.  No hernias, no lesions  Rectal:  Deferred to urologist.   Extremities:  No clubbing or cyanosis, 1+ pretibeal edema bilaterally  Pulses:  2+ and symmetric all extremities   Skin:  Skin turgor normal, no rashes or lesions; hyperpigmentation present R>L consistent with venous stasis changes. AK's on scalp   Lymph nodes:  Cervical, supraclavicular, and axillary nodes normal   Neurologic:  CNII-XII intact, normal strength, sensation and gait; reflexes 2+ and symmetric throughout          Psych: Normal mood, affect, hygiene and grooming.   ASSESSMENT/PLAN:  Routine general medical examination at a health care facility  - Plan: Visual acuity screening, POCT Urinalysis Dipstick  Impaired fasting glucose -  discussed need for weight less; reviewed diet and exercise in detail - Plan: Hemoglobin A1c, Glucose, random  Hypogonadism male - off replacement now.  desires recheck testosterone.  discussed obesity and testosterone - Plan: Testosterone  Snoring - Plan: Ambulatory referral to Sleep Studies  Daytime somnolence - h/o mild sleep apnea; likely more severe given is morbid obesity.  reviewed risks of untreated OSA. refer for sleep study - Plan: Ambulatory referral to Sleep Studies  Morbid obesity with body mass index of 40.0-44.9 in adult - Plan: Ambulatory referral to Sleep Studies  HYPERTENSION - controlled  GERD (gastroesophageal reflux disease) - controlled with PPI.  reviewed longterm risks of untreated GERD vs meds.  encouraged weight loss, reviewed proper diet/precautions  Actinic keratosis - many, on scalp.  Encouraged to f/u with dermatologist for treatment in the near future  Hypogonadism--minimally symptomatic (re: fatigue; ongoing ED which didn't improve with treatment).  Reviewed symptoms, risks of meds.  Repeat level, and refer back, if indicated, to resume treatment (he still has at home).  Clearly having some issues re: his wife and her lack of interest.  Suggested counseling.  PSA screening done by his urologist; recommended at least 30 minutes of aerobic activity at least 5 days/week; proper sunscreen use reviewed; healthy diet and alcohol recommendations (less than or equal to 2 drinks/day) reviewed; regular seatbelt use; changing batteries in smoke detectors. Self-testicular exams. Immunization recommendations discussed.  Colonoscopy recommendations reviewed, UTD  Return for high dose flu shot when available (not currently available)  F/u 6 months, sooner prn based on labs or other problems

## 2013-05-27 NOTE — Patient Instructions (Addendum)
HEALTH MAINTENANCE RECOMMENDATIONS:  It is recommended that you get at least 30 minutes of aerobic exercise at least 5 days/week (for weight loss, you may need as much as 60-90 minutes). This can be any activity that gets your heart rate up. This can be divided in 10-15 minute intervals if needed, but try and build up your endurance at least once a week.  Weight bearing exercise is also recommended twice weekly.  Eat a healthy diet with lots of vegetables, fruits and fiber.  "Colorful" foods have a lot of vitamins (ie green vegetables, tomatoes, red peppers, etc).  Limit sweet tea, regular sodas and alcoholic beverages, all of which has a lot of calories and sugar.  Up to 2 alcoholic drinks daily may be beneficial for men (unless trying to lose weight, watch sugars).  Drink a lot of water.  Sunscreen of at least SPF 30 should be used on all sun-exposed parts of the skin when outside between the hours of 10 am and 4 pm (not just when at beach or pool, but even with exercise, golf, tennis, and yard work!)  Use a sunscreen that says "broad spectrum" so it covers both UVA and UVB rays, and make sure to reapply every 1-2 hours.  Remember to change the batteries in your smoke detectors when changing your clock times in the spring and fall.  Use your seat belt every time you are in a car, and please drive safely and not be distracted with cell phones and texting while driving.   We are referring you for a sleep study. Please follow up with dermatologist--you have a lot of precancerous lesions on your scalp (actinic keratosis) that need to be treated

## 2013-05-28 LAB — TESTOSTERONE: Testosterone: 198 ng/dL — ABNORMAL LOW (ref 300–890)

## 2013-05-31 ENCOUNTER — Telehealth: Payer: Self-pay | Admitting: *Deleted

## 2013-05-31 NOTE — Telephone Encounter (Signed)
Left message for patient to return my call to go over lab results. 

## 2013-06-02 ENCOUNTER — Telehealth: Payer: Self-pay | Admitting: *Deleted

## 2013-06-02 NOTE — Telephone Encounter (Signed)
Left message for pat to return my call to go over lab work.

## 2013-06-25 ENCOUNTER — Encounter: Payer: Self-pay | Admitting: *Deleted

## 2013-07-07 ENCOUNTER — Ambulatory Visit (HOSPITAL_BASED_OUTPATIENT_CLINIC_OR_DEPARTMENT_OTHER): Payer: 59 | Attending: Family Medicine | Admitting: Radiology

## 2013-07-07 VITALS — Ht 67.0 in | Wt 280.0 lb

## 2013-07-07 DIAGNOSIS — G4733 Obstructive sleep apnea (adult) (pediatric): Secondary | ICD-10-CM | POA: Insufficient documentation

## 2013-07-07 DIAGNOSIS — R400 Somnolence: Secondary | ICD-10-CM

## 2013-07-07 DIAGNOSIS — R0683 Snoring: Secondary | ICD-10-CM

## 2013-07-10 DIAGNOSIS — G4710 Hypersomnia, unspecified: Secondary | ICD-10-CM

## 2013-07-10 DIAGNOSIS — R0609 Other forms of dyspnea: Secondary | ICD-10-CM

## 2013-07-10 DIAGNOSIS — R0989 Other specified symptoms and signs involving the circulatory and respiratory systems: Secondary | ICD-10-CM

## 2013-07-10 NOTE — Procedures (Signed)
NAME:  Jeffrey Wiggins, Jeffrey Wiggins NO.:  [id number]  MEDICAL RECORD NO.:  [id number]          PATIENT TYPE:  OUT  LOCATION:  SLEEP CENTER                 FACILITY:  [facility]  PHYSICIAN:  Ahmyah Gidley D. Maple Hudson, MD, FCCP, FACPDATE OF BIRTH:  1940[birth year]/27  DATE OF STUDY:  07/07/2013                           NOCTURNAL POLYSOMNOGRAM  REFERRING PHYSICIAN:  Lavonda Jumbo, M.D.  INDICATION FOR STUDY:  Hypersomnia with sleep apnea.  EPWORTH SLEEPINESS SCORE:  7/24.  BMI 43.9, weight 280 pounds, height 67 inches, neck 17 inches.  MEDICATIONS:  Home medications are charted for review.  SLEEP ARCHITECTURE:  Split study protocol.  During the diagnostic phase, total sleep time 120.5 minutes with sleep efficiency 65.1%.  Stage I was 14.4%, stage II 85.9%, stages III and REM were absent.  Sleep latency 20 minutes.  Awake after sleep onset 36.5 minutes.  Arousal index 17.4.  Bedtime Medication:  Aspirin, Lipitor, study drug or placebo, Flonase, Claritin, Tylenol, mineral supplement.  RESPIRATORY DATA:  Split study protocol.  Apnea/hypopnea index (AHI) 30.4 per hour.  A total of 61 events was scored including 1 obstructive apnea and 60 hypopneas.  Events were non-supine.  CPAP was then titrated to 11 CWP, AHI 0 per hour.  He wore a medium [employer] Simplus full-face mask with heated humidifier.  OXYGEN DATA:  Snoring was moderate to loud before CPAP with oxygen desaturation to a nadir of 86% on room air.  With CPAP control, snoring was prevented and mean oxygen saturation held 92.6% on room air.  CARDIAC DATA:  Sinus rhythm with PACs end frequent PVCs.  MOVEMENT-PARASOMNIA:  Periodic limb movement during the diagnostic phase with a total of 68 limb jerks of which only 1 appeared to affect sleep. Very few events during the titration phase, suggesting that many of these events were related to respiratory arousals.  Bathroom x2.  IMPRESSIONS-RECOMMENDATIONS: 1. Moderate-to-severe  obstructive sleep apnea/hypopnea syndrome, AHI     30.4 per hour with supine events.  Snoring was moderate to     sometimes loud with oxygen desaturation to a nadir of 86% on room     air. 2. Successful CPAP titration to 11 CWP, AHI 0 per hour.  Snoring was     prevented and mean oxygen saturation held 92.6% on room air.  He     wore a medium [employer] Simplus full-face mask with heated     humidifier.     Erastus Bartolomei D. Maple Hudson, MD, Tonny Bollman, FACP Diplomate, American Board of Sleep Medicine    CDY/MEDQ  D:  07/10/2013 10:49:54  T:  07/10/2013 11:26:49  Job:  [id number]

## 2013-07-11 ENCOUNTER — Encounter: Payer: Self-pay | Admitting: Family Medicine

## 2013-07-29 ENCOUNTER — Other Ambulatory Visit: Payer: Self-pay | Admitting: Cardiology

## 2013-09-01 ENCOUNTER — Encounter: Payer: Self-pay | Admitting: Family Medicine

## 2013-09-01 DIAGNOSIS — E291 Testicular hypofunction: Secondary | ICD-10-CM | POA: Insufficient documentation

## 2013-09-17 ENCOUNTER — Ambulatory Visit (INDEPENDENT_AMBULATORY_CARE_PROVIDER_SITE_OTHER): Payer: 59 | Admitting: Medical

## 2013-09-17 ENCOUNTER — Ambulatory Visit: Payer: 59 | Admitting: Medical

## 2013-09-17 ENCOUNTER — Encounter: Payer: Self-pay | Admitting: Medical

## 2013-09-17 VITALS — BP 110/68 | HR 88 | Temp 98.6°F | Resp 16 | Wt 276.0 lb

## 2013-09-17 DIAGNOSIS — R05 Cough: Secondary | ICD-10-CM

## 2013-09-17 DIAGNOSIS — R059 Cough, unspecified: Secondary | ICD-10-CM

## 2013-09-17 MED ORDER — AZITHROMYCIN 250 MG PO TABS
ORAL_TABLET | ORAL | Status: DC
Start: 2013-09-17 — End: 2013-09-17

## 2013-09-17 MED ORDER — AZITHROMYCIN 250 MG PO TABS: ORAL_TABLET | ORAL | Status: DC

## 2013-09-17 NOTE — Progress Notes (Signed)
Subjective:  Jeffrey Wiggins is a 73 y.o. male who presents for 1 wk hx/o illness.  Symptoms include starting with chest congestion, cough, but has worssneed to sinuses now too.  He reports ongoing cough, chest and head congestion, productive cough, cream colored, colored nasal drainage.  Low grade fever.  Denies NVD, ear pain, sore throat.   He was cleaning an area around his work desk, through up a lot of dust.  Thinks this triggered his "bronchitis."   Within in a day got hoarse.  Treatment to date: claritin, mucinex some, delsym..  Wife has had mild cough.   He does not smoke.   He does have a history of bronchitis.   No other aggravating or relieving factors.  No other c/o.  The following portions of the patient's history were reviewed and updated as appropriate: allergies, current medications, past family history, past medical history, past social history, past surgical history and problem list.  ROS as in subjective  Past Medical History  Diagnosis Date  . CAD (coronary artery disease)   . HTN (hypertension)   . Prostatitis   . Hyperlipidemia   . Obesity   . Diverticular disease   . Gallstones   . Hepatitis age 39  . Arthritis   . Sleep apnea     mild, CPAP wasn't indicated (per pt)--2014 mod-severe, CPAP rec  . Benign prostatic hypertrophy     followed by Dr. Gaynelle Arabian  . Cerebrovascular disease   . Sepsis due to Escherichia coli ? 2011    DUE TO URINARY OUTLET OBSTRUCTION /INFECTION  . UTI (urinary tract infection)     secondary  . Allergy     RHINITIS  . 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 Stanford Breed); s/p stents  . GERD (gastroesophageal reflux disease)   . Hypogonadism male     treated by Dr. Gaynelle Arabian  . Erectile dysfunction   . Peripheral vascular disease     02/28/12 MOST RECENT CAROTID DUPLEX--"STABLE MILD CALCIFIED PLAQUE, BILATERALY, STABLE, OVER SERIAL EXAMS"  . 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 IFG (impaired fasting glucose)   . Pneumonia      hx of     Objective: BP 110/68  Pulse 88  Temp(Src) 98.6 F (37 C) (Oral)  Resp 16  Wt 276 lb (125.193 kg)   General appearance: Alert, WD/WN, no distress, mildly ill appearing                             Skin: warm, no rash, no diaphoresis                           Head: no sinus tenderness                            Eyes: conjunctiva normal, corneas clear, PERRLA                            Ears: pearly TMs, external ear canals normal                          Nose: septum midline, turbinates with erythema and clear discharge             Mouth/throat: MMM, tongue normal, mild pharyngeal erythema  Neck: supple, no adenopathy, no thyromegaly, nontender                          Heart: RRR, normal S1, S2, no murmurs                         Lungs: clear, no rhonchi, no wheezes, no rales                Extremities: no edema, nontender     Assessment: Encounter Diagnosis  Name Primary?  . Cough Yes    Plan:   we discussed his symptoms and exam, discussed that currently this would appear to be a viral upper respiratory infection  Suggested symptomatic OTC remedies for cough and congestion.  Tylenol OTC for fever and malaise.  We discussed that if symptoms get much worse in the next 48-72 hours with fever, consistent productive sputum, significantly worse in general, then begin Z-Pak. Otherwise this appears to be viral and just needs arise course. Watch and wait prescription for Z-Pak given. F/u prn.

## 2013-11-12 ENCOUNTER — Other Ambulatory Visit: Payer: Self-pay | Admitting: Cardiology

## 2013-12-21 ENCOUNTER — Other Ambulatory Visit: Payer: Self-pay | Admitting: *Deleted

## 2013-12-21 MED ORDER — NITROGLYCERIN 0.4 MG SL SUBL: 0.4000 mg | SUBLINGUAL_TABLET | SUBLINGUAL | Status: DC | PRN

## 2014-03-07 ENCOUNTER — Other Ambulatory Visit: Payer: Self-pay | Admitting: Cardiology

## 2014-03-09 ENCOUNTER — Other Ambulatory Visit: Payer: Self-pay | Admitting: Dermatology

## 2014-03-14 DIAGNOSIS — C4492 Squamous cell carcinoma of skin, unspecified: Secondary | ICD-10-CM

## 2014-03-14 HISTORY — DX: Squamous cell carcinoma of skin, unspecified: C44.92

## 2014-04-01 ENCOUNTER — Encounter: Payer: Self-pay | Admitting: *Deleted

## 2014-04-01 ENCOUNTER — Ambulatory Visit (INDEPENDENT_AMBULATORY_CARE_PROVIDER_SITE_OTHER): Payer: 59 | Admitting: Cardiology

## 2014-04-01 ENCOUNTER — Encounter: Payer: Self-pay | Admitting: Cardiology

## 2014-04-01 VITALS — BP 150/80 | HR 92 | Ht 67.0 in | Wt 285.0 lb

## 2014-04-01 DIAGNOSIS — I6529 Occlusion and stenosis of unspecified carotid artery: Secondary | ICD-10-CM

## 2014-04-01 DIAGNOSIS — I5031 Acute diastolic (congestive) heart failure: Secondary | ICD-10-CM | POA: Insufficient documentation

## 2014-04-01 DIAGNOSIS — I2510 Atherosclerotic heart disease of native coronary artery without angina pectoris: Secondary | ICD-10-CM

## 2014-04-01 DIAGNOSIS — R0989 Other specified symptoms and signs involving the circulatory and respiratory systems: Secondary | ICD-10-CM

## 2014-04-01 DIAGNOSIS — R0609 Other forms of dyspnea: Secondary | ICD-10-CM

## 2014-04-01 MED ORDER — FUROSEMIDE 20 MG PO TABS
20.0000 mg | ORAL_TABLET | Freq: Every day | ORAL | Status: DC
Start: 2014-04-01 — End: 2014-08-08

## 2014-04-01 NOTE — Assessment & Plan Note (Signed)
Schedule followup carotid Dopplers.Continue aspirin and statin.

## 2014-04-01 NOTE — Assessment & Plan Note (Signed)
Continue statin. Check lipids and liver. 

## 2014-04-01 NOTE — Progress Notes (Signed)
HPI: FU coronary artery disease; status post coronary bypassing graft in January 2009. Note at that time, he had a LIMA to the LAD, free RIMA to the obtuse marginal, and sequential saphenous vein graft to the first and second diagonals, as well as saphenous vein graft to the PDA. Myoview in October of 2012 showed an ejection fraction of 52%. Prior inferior infarct and mild peri-infarct ischemia. We have treated medically. Carotid Dopplers in June of 2013 showed a 40-59% right and 0-39% left stenosis. Followup recommended in 2 years. Since I last saw him, The patient has noticed increasing dyspnea on exertion. There is no orthopnea or PND but there is increasing pedal edema. He has gained 12 pounds over the past one and one half months. He has some chest tightness on the treadmill it is relieved with rest.   Current Outpatient Prescriptions  Medication Sig Dispense Refill  . acetaminophen (TYLENOL) 500 MG tablet Take 1,000 mg by mouth as needed. Pain      . aspirin 81 MG tablet Take 81 mg by mouth daily.      Marland Kitchen atorvastatin (LIPITOR) 80 MG tablet Take 80 mg by mouth every evening.      Marland Kitchen CALCIUM-MAGNESIUM-ZINC PO Take 1 each by mouth daily.      . Cholecalciferol (VITAMIN D) 2000 UNITS tablet Take 2,000 Units by mouth daily.      Marland Kitchen co-enzyme Q-10 30 MG capsule Take 30 mg by mouth 3 (three) times daily.      . fluticasone (FLONASE) 50 MCG/ACT nasal spray Place 2 sprays into the nose daily.  48 g  1  . hydrochlorothiazide (HYDRODIURIL) 25 MG tablet Take 25 mg by mouth every morning.      . loratadine (CLARITIN) 10 MG tablet Take 10 mg by mouth daily.       Marland Kitchen losartan (COZAAR) 100 MG tablet Take 100 mg by mouth every morning.      . nitroGLYCERIN (NITROSTAT) 0.4 MG SL tablet Place 1 tablet (0.4 mg total) under the tongue every 5 (five) minutes as needed.  25 tablet  12  . omeprazole (PRILOSEC) 20 MG capsule Take 20 mg by mouth every morning.      . STUDY MEDICATION Take 1 tablet by mouth every  evening. [facility] for cholesterol. Study medication Anacetrapib or placebo      . testosterone cypionate (DEPOTESTOTERONE CYPIONATE) 200 MG/ML injection       . vitamin E 100 UNIT capsule Take 100 Units by mouth daily.       No current facility-administered medications for this visit.     Past Medical History  Diagnosis Date  . CAD (coronary artery disease)   . HTN (hypertension)   . Prostatitis   . Hyperlipidemia   . Obesity   . Diverticular disease   . Gallstones   . Hepatitis age 41  . Arthritis   . Sleep apnea     mild, CPAP wasn't indicated (per pt)--2014 mod-severe, CPAP rec  . Benign prostatic hypertrophy     followed by Dr. Gaynelle Arabian  . Cerebrovascular disease   . Sepsis due to Escherichia coli ? 2011    DUE TO URINARY OUTLET OBSTRUCTION /INFECTION  . UTI (urinary tract infection)     secondary  . Allergy     RHINITIS  . 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 Stanford Breed); s/p stents  . GERD (gastroesophageal reflux disease)   . Hypogonadism male     treated  by Dr. Gaynelle Arabian  . Erectile dysfunction   . Peripheral vascular disease     02/28/12 MOST RECENT CAROTID DUPLEX--"STABLE MILD CALCIFIED PLAQUE, BILATERALY, STABLE, OVER SERIAL EXAMS"  . 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 IFG (impaired fasting glucose)   . Pneumonia     hx of    Past Surgical History  Procedure Laterality Date  . Total knee arthroplasty  L 4/06, R 4/07  . Vasectomy    . Tonsillectomy    . Transurethral resection of prostate  06/2010  . Knee surgery  05/2010    removing scar tissue R knee (Dr. Wynelle Link)  . Coronary angioplasty with stent placement  01/1997    PTCA/stent to RCA  AND 2009 2ND STENT PLACED  . Joint replacement      both knees  . Total knee revision  09/23/2012    Procedure: TOTAL KNEE REVISION;  Surgeon: Gearlean Alf, MD;  Location: [facility];  Service: Orthopedics;  Laterality: Right;    History   Social History  . Marital Status: Married     Spouse Name: N/A    Number of Children: 4  . Years of Education: N/A   Occupational History  . CPA    Social History Main Topics  . Smoking status: Former Smoker -- 1.50 packs/day for 30 years    Types: Cigarettes    Quit date: 09/16/1986  . Smokeless tobacco: Never Used     Comment: 35 pack year history, none in 18 years  . Alcohol Use: Yes     Comment: 1 glass of wine twice a week (previously daily)  . Drug Use: No  . Sexual Activity: Not Currently   Other Topics Concern  . Not on file   Social History Narrative   Lives with wife.  4 kids, 1 stepchild (1 in [place], 4 in [place]). No pets. Wife is a diabetic    ROS: no fevers or chills, productive cough, hemoptysis, dysphasia, odynophagia, melena, hematochezia, dysuria, hematuria, rash, seizure activity, orthopnea, PND, pedal edema, claudication. Remaining systems are negative.  Physical Exam: Well-developed obese in no acute distress.  Skin is warm and dry.  HEENT is normal.  Neck is supple.  Chest is clear to auscultation with normal expansion.  Cardiovascular exam is regular rate and rhythm. 2/6 systolic murmur left sternal border Abdominal exam nontender or distended. No masses palpated. Extremities show 1+ edema. neuro grossly intact  ECG Sinus rhythm, occasional PVC, nonspecific ST changes.

## 2014-04-01 NOTE — Assessment & Plan Note (Signed)
Patient presents with complaints of increasing dyspnea on exertion, pedal edema and weight gain. His presentation seems most consistent with acute diastolic congestive heart failure. Schedule echocardiogram to assess LV diastolic and systolic function. This will also help evaluate RV function given history of obesity and obstructive sleep apnea. He is also having some chest tightness but presentation seems more consistent with CHF. Schedule nuclear study to exclude ischemia. Low threshold for cardiac catheterization. Discontinue HCTZ. Add Lasix 40 mg daily for 2 days and then 20 mg daily. Check potassium and renal function as well as BNP in one week.

## 2014-04-01 NOTE — Patient Instructions (Addendum)
Your physician recommends that you schedule a follow-up appointment in: [place] has requested that you have an echocardiogram. Echocardiography is a painless test that uses sound waves to create images of your heart. It provides your doctor with information about the size and shape of your heart and how well your heart's chambers and valves are working. This procedure takes approximately one hour. There are no restrictions for this procedure.   Your physician has requested that you have en exercise stress myoview. For further information please visit HugeFiesta.[email or web address]. Please follow instruction sheet, as given.   STOP HCTZ  START FUROSEMIDE 20 MG =TAKE 2 TABLET TODAY AND TOMORROW THEN ONE TABLET DAILY   Your physician recommends that you return for lab work in: ONE WEEK-DO NOT EAT PRIOR TO LAB WORK  Your physician has requested that you have a carotid duplex. This test is an ultrasound of the carotid arteries in your neck. It looks at blood flow through these arteries that supply the brain with blood. Allow one hour for this exam. There are no restrictions or special instructions.

## 2014-04-01 NOTE — Assessment & Plan Note (Signed)
Blood pressure mildly elevated but typically controlled. Continue present medications.

## 2014-04-01 NOTE — Assessment & Plan Note (Signed)
Continue aspirin and statin. Schedule followup nuclear study.

## 2014-04-02 ENCOUNTER — Other Ambulatory Visit: Payer: Self-pay | Admitting: Cardiology

## 2014-04-06 ENCOUNTER — Telehealth (HOSPITAL_COMMUNITY): Payer: Self-pay

## 2014-04-06 NOTE — Telephone Encounter (Signed)
Encounter complete. 

## 2014-04-07 ENCOUNTER — Encounter (HOSPITAL_COMMUNITY): Payer: Medicare Other

## 2014-04-08 ENCOUNTER — Ambulatory Visit (HOSPITAL_COMMUNITY)
Admission: RE | Admit: 2014-04-08 | Discharge: 2014-04-08 | Disposition: A | Discharge status: Home / Self Care | Payer: 59 | Source: Ambulatory Visit | Attending: Cardiovascular Disease | Admitting: Cardiovascular Disease

## 2014-04-08 DIAGNOSIS — R0609 Other forms of dyspnea: Secondary | ICD-10-CM | POA: Insufficient documentation

## 2014-04-08 DIAGNOSIS — Z951 Presence of aortocoronary bypass graft: Secondary | ICD-10-CM | POA: Insufficient documentation

## 2014-04-08 DIAGNOSIS — I252 Old myocardial infarction: Secondary | ICD-10-CM | POA: Insufficient documentation

## 2014-04-08 DIAGNOSIS — E669 Obesity, unspecified: Secondary | ICD-10-CM | POA: Insufficient documentation

## 2014-04-08 DIAGNOSIS — R079 Chest pain, unspecified: Secondary | ICD-10-CM | POA: Insufficient documentation

## 2014-04-08 DIAGNOSIS — Z8673 Personal history of transient ischemic attack (TIA), and cerebral infarction without residual deficits: Secondary | ICD-10-CM | POA: Insufficient documentation

## 2014-04-08 DIAGNOSIS — I779 Disorder of arteries and arterioles, unspecified: Secondary | ICD-10-CM | POA: Insufficient documentation

## 2014-04-08 DIAGNOSIS — I509 Heart failure, unspecified: Secondary | ICD-10-CM | POA: Insufficient documentation

## 2014-04-08 DIAGNOSIS — R0989 Other specified symptoms and signs involving the circulatory and respiratory systems: Secondary | ICD-10-CM | POA: Insufficient documentation

## 2014-04-08 DIAGNOSIS — I739 Peripheral vascular disease, unspecified: Secondary | ICD-10-CM | POA: Insufficient documentation

## 2014-04-08 DIAGNOSIS — Z9861 Coronary angioplasty status: Secondary | ICD-10-CM | POA: Insufficient documentation

## 2014-04-08 DIAGNOSIS — I10 Essential (primary) hypertension: Secondary | ICD-10-CM | POA: Insufficient documentation

## 2014-04-08 DIAGNOSIS — Z87891 Personal history of nicotine dependence: Secondary | ICD-10-CM | POA: Insufficient documentation

## 2014-04-08 DIAGNOSIS — I2510 Atherosclerotic heart disease of native coronary artery without angina pectoris: Secondary | ICD-10-CM

## 2014-04-08 LAB — HEPATIC FUNCTION PANEL
ALT: 31 U/L (ref 0–53)
AST: 21 U/L (ref 0–37)
Albumin: 3.9 g/dL (ref 3.5–5.2)
Alkaline Phosphatase: 40 U/L (ref 39–117)
BILIRUBIN DIRECT: 0.2 mg/dL (ref 0.0–0.3)
BILIRUBIN INDIRECT: 0.7 mg/dL (ref 0.2–1.2)
TOTAL PROTEIN: 6.6 g/dL (ref 6.0–8.3)
Total Bilirubin: 0.9 mg/dL (ref 0.2–1.2)

## 2014-04-08 LAB — BASIC METABOLIC PANEL WITH GFR
BUN: 12 mg/dL (ref 6–23)
CO2: 30 mEq/L (ref 19–32)
Calcium: 9.2 mg/dL (ref 8.4–10.5)
Chloride: 104 mEq/L (ref 96–112)
Creat: 0.97 mg/dL (ref 0.50–1.35)
GFR, EST NON AFRICAN AMERICAN: 78 mL/min
Glucose, Bld: 89 mg/dL (ref 70–99)
POTASSIUM: 4.8 meq/L (ref 3.5–5.3)
SODIUM: 142 meq/L (ref 135–145)

## 2014-04-08 LAB — LIPID PANEL
CHOL/HDL RATIO: 2.5 ratio
CHOLESTEROL: 96 mg/dL (ref 0–200)
HDL: 39 mg/dL — ABNORMAL LOW (ref >39)
LDL Cholesterol: 36 mg/dL (ref 0–99)
TRIGLYCERIDES: 103 mg/dL (ref <150)
VLDL: 21 mg/dL (ref 0–40)

## 2014-04-08 MED ORDER — TECHNETIUM TC 99M SESTAMIBI GENERIC - CARDIOLITE
30.4000 | Freq: Once | INTRAVENOUS | Status: AC | PRN
  Administered 2014-04-08: 30 via INTRAVENOUS

## 2014-04-08 MED ORDER — TECHNETIUM TC 99M SESTAMIBI GENERIC - CARDIOLITE
10.2000 | Freq: Once | INTRAVENOUS | Status: AC | PRN
  Administered 2014-04-08: 10 via INTRAVENOUS

## 2014-04-08 NOTE — Procedures (Addendum)
[facility] [address] [place] [id number] [phone]  Cardiology Nuclear Med Study  Jeffrey Wiggins is a 73 y.o. male     MRN : [id number]     DOB: [birth year]  Procedure Date: 04/08/2014  Nuclear Med Background Indication for Stress Test:  Evaluation for Ischemia and Stent Patency History:  CAD;MI-1998 AND 2009;CABG-09/2007;STENT/PTCA-01/1997;bradycardia;CHF;Last NUC MPI on 06/17/2011;EF=52%; Cardiac Risk Factors: Carotid Disease, CVA, Family History - CAD, History of Smoking, Hypertension, Lipids, Obesity, PVD and PVC's  Symptoms:  Chest Pain and DOE   Nuclear Pre-Procedure Caffeine/Decaff Intake:  7:00pm NPO After: 5:00am   IV Site: R Forearm  IV 0.9% NS with Angio Cath:  22g  Chest Size (in):  56"  IV Started by: Rolene Course, RN  Height: 5\' 7"  (1.702 m)  Cup Size: n/a  BMI:  Body mass index is 44.63 kg/(m^2). Weight:  285 lb (129.275 kg)   Tech Comments:  n/a    Nuclear Med Study 1 or 2 day study: 1 day  Stress Test Type:  Stress  Order Authorizing Provider:  Kirk Ruths, MD   Resting Radionuclide: Technetium 34m Sestamibi  Resting Radionuclide Dose: 10.2 mCi   Stress Radionuclide:  Technetium 99m Sestamibi  Stress Radionuclide Dose: 30.4 mCi           Stress Protocol Rest HR: 84 Stress HR: 164  Rest BP: 152/87 Stress BP:158/75  Exercise Time (min): 5:16 METS: 6.10   Predicted Max HR: 148 bpm % Max HR: 110.81 bpm Rate Pressure Product: 25912  Dose of Adenosine (mg):  n/a Dose of Lexiscan: n/a mg  Dose of Atropine (mg): n/a Dose of Dobutamine: n/a mcg/kg/min (at max HR)  Stress Test Technologist: Mellody Memos, CCT Nuclear Technologist: Imagene Riches, CNMT   Rest Procedure:  Myocardial perfusion imaging was performed at rest 45 minutes following the intravenous administration of Technetium 37m Sestamibi. Stress Procedure:  The patient performed treadmill exercise using a Bruce  Protocol for 5  minutes 16 seconds. The patient stopped due to shortness of breath and fatigue. Patient stated that he did experience some chest tightness at the peak level of exercise. On a scale of 1-10 he said a 5.  There were significant ST-T wave changes.  Technetium 75m Sestamibi was injected IV at peak exercise and myocardial perfusion imaging was performed after a brief delay.  Transient Ischemic Dilatation (Normal <1.22):  1.26  QGS EDV:  156 ml QGS ESV:  79 ml LV Ejection Fraction: 49%      Rest ECG: NSR - Normal EKG and PACs  Stress ECG: Significant ST abnormalities consistent with ischemia. 1-2 mm horizontal ST depression in the inferior leads and V4-V6  QPS Raw Data Images:  Normal; no motion artifact; normal heart/lung ratio. Stress Images:  There is decreased uptake in the apex and in the inferior wall. Rest Images:  There is little change in in the inferior defect. There is complete resolution of the apical defect. Subtraction (SDS):  These findings are consistent with ischemia in the LAD artery distribution and scar in the right coronary artery distribution.. LV Wall Motion:  Normal apical wall motion. Mild inferior hypokinesis. Mildly depressed overall systolic function, EF 31%.  Impression Exercise Capacity:  Fair exercise capacity. BP Response:  Hypotensive blood pressure response. Clinical Symptoms:  Typical chest pain. ECG Impression:  There are scattered PACs. Comparison with Prior Nuclear Study: Compared to 2012, the reversible apical defect is new.   Overall Impression:  High risk stress nuclear study with ischemia in the LAD distribution and old scar in the RCA distribution, new since 2012.Sanda Klein, MD  04/08/2014 1:06 PM

## 2014-04-09 LAB — BRAIN NATRIURETIC PEPTIDE: Brain Natriuretic Peptide: 60 pg/mL (ref 0.0–100.0)

## 2014-04-10 ENCOUNTER — Other Ambulatory Visit: Payer: Self-pay

## 2014-04-10 MED ORDER — LOSARTAN POTASSIUM 100 MG PO TABS: ORAL_TABLET | ORAL | Status: DC

## 2014-04-11 ENCOUNTER — Ambulatory Visit (HOSPITAL_COMMUNITY)
Admission: RE | Admit: 2014-04-11 | Discharge: 2014-04-11 | Disposition: A | Discharge status: Home / Self Care | Payer: 59 | Source: Ambulatory Visit | Attending: Cardiology | Admitting: Cardiology

## 2014-04-11 ENCOUNTER — Ambulatory Visit (HOSPITAL_BASED_OUTPATIENT_CLINIC_OR_DEPARTMENT_OTHER)
Admission: RE | Admit: 2014-04-11 | Discharge: 2014-04-11 | Disposition: A | Discharge status: Home / Self Care | Payer: 59 | Source: Ambulatory Visit | Attending: Cardiology | Admitting: Cardiology

## 2014-04-11 DIAGNOSIS — R0609 Other forms of dyspnea: Secondary | ICD-10-CM | POA: Diagnosis present

## 2014-04-11 DIAGNOSIS — I6529 Occlusion and stenosis of unspecified carotid artery: Secondary | ICD-10-CM

## 2014-04-11 DIAGNOSIS — I517 Cardiomegaly: Secondary | ICD-10-CM

## 2014-04-11 DIAGNOSIS — R0989 Other specified symptoms and signs involving the circulatory and respiratory systems: Secondary | ICD-10-CM | POA: Diagnosis not present

## 2014-04-11 NOTE — Progress Notes (Signed)
Carotid Duplex Completed. Euel Castile, BS, RDMS, RVT  

## 2014-04-11 NOTE — Progress Notes (Signed)
2D Echocardiogram Complete.  04/11/2014   Cora Brierley, RDCS

## 2014-04-12 ENCOUNTER — Encounter: Payer: Self-pay | Admitting: Cardiology

## 2014-04-12 ENCOUNTER — Encounter (HOSPITAL_COMMUNITY): Payer: Self-pay | Admitting: Pharmacy Technician

## 2014-04-12 ENCOUNTER — Other Ambulatory Visit: Payer: Self-pay | Admitting: Cardiology

## 2014-04-12 ENCOUNTER — Encounter: Payer: Self-pay | Admitting: *Deleted

## 2014-04-12 ENCOUNTER — Ambulatory Visit (INDEPENDENT_AMBULATORY_CARE_PROVIDER_SITE_OTHER): Payer: 59 | Admitting: Cardiology

## 2014-04-12 VITALS — BP 144/86 | HR 77 | Ht 67.0 in | Wt 288.0 lb

## 2014-04-12 DIAGNOSIS — I2510 Atherosclerotic heart disease of native coronary artery without angina pectoris: Secondary | ICD-10-CM

## 2014-04-12 DIAGNOSIS — R0989 Other specified symptoms and signs involving the circulatory and respiratory systems: Secondary | ICD-10-CM

## 2014-04-12 DIAGNOSIS — T148XXA Other injury of unspecified body region, initial encounter: Secondary | ICD-10-CM

## 2014-04-12 DIAGNOSIS — Z01810 Encounter for preprocedural cardiovascular examination: Secondary | ICD-10-CM

## 2014-04-12 DIAGNOSIS — R0602 Shortness of breath: Secondary | ICD-10-CM

## 2014-04-12 DIAGNOSIS — R0609 Other forms of dyspnea: Secondary | ICD-10-CM

## 2014-04-12 DIAGNOSIS — R9430 Abnormal result of cardiovascular function study, unspecified: Secondary | ICD-10-CM

## 2014-04-12 LAB — BASIC METABOLIC PANEL WITH GFR
BUN: 8 mg/dL (ref 6–23)
CALCIUM: 9.2 mg/dL (ref 8.4–10.5)
CO2: 31 mEq/L (ref 19–32)
CREATININE: 0.99 mg/dL (ref 0.50–1.35)
Chloride: 103 mEq/L (ref 96–112)
GFR, EST AFRICAN AMERICAN: 88 mL/min
GFR, Est Non African American: 76 mL/min
Glucose, Bld: 95 mg/dL (ref 70–99)
Potassium: 4.3 mEq/L (ref 3.5–5.3)
Sodium: 141 mEq/L (ref 135–145)

## 2014-04-12 LAB — CBC
HCT: 40.1 % (ref 39.0–52.0)
Hemoglobin: 13 g/dL (ref 13.0–17.0)
MCH: 26.9 pg (ref 26.0–34.0)
MCHC: 32.4 g/dL (ref 30.0–36.0)
MCV: 82.9 fL (ref 78.0–100.0)
PLATELETS: 296 10*3/uL (ref 150–400)
RBC: 4.84 MIL/uL (ref 4.22–5.81)
RDW: 15.5 % (ref 11.5–15.5)
WBC: 7.1 10*3/uL (ref 4.0–10.5)

## 2014-04-12 LAB — PROTIME-INR
INR: 1.02 (ref <1.50)
PROTHROMBIN TIME: 13.4 s (ref 11.6–15.2)

## 2014-04-12 NOTE — Patient Instructions (Signed)
Your physician recommends that you schedule a follow-up appointment in: [place]  Your physician recommends that you HAVE LAB [place]  Your physician has requested that you have a cardiac catheterization. Cardiac catheterization is used to diagnose and/or treat various heart conditions. Doctors may recommend this procedure for a number of different reasons. The most common reason is to evaluate chest pain. Chest pain can be a symptom of coronary artery disease (CAD), and cardiac catheterization can show whether plaque is narrowing or blocking your heart's arteries. This procedure is also used to evaluate the valves, as well as measure the blood flow and oxygen levels in different parts of your heart. For further information please visit [email or web address]. Please follow instruction sheet, as given.   Your physician recommends that you return for lab work in: [place]

## 2014-04-12 NOTE — Assessment & Plan Note (Signed)
Continue statin. 

## 2014-04-12 NOTE — Assessment & Plan Note (Signed)
Continue present blood pressure medications. 

## 2014-04-12 NOTE — Assessment & Plan Note (Signed)
Continue aspirin and statin. Follow-up carotid Dopplers July 2016. 

## 2014-04-12 NOTE — Assessment & Plan Note (Signed)
Continue aspirin and statin. Nuclear study felt to be high risk. Catheterization as outlined under diastolic congestive heart failure.

## 2014-04-12 NOTE — Assessment & Plan Note (Signed)
Patient has had recent onset of increasing dyspnea. This may be related to diastolic dysfunction and he is volume overloaded on examination. Increase Lasix to 40 mg daily. Check potassium and renal function in one week. His nuclear study is also abnormal. I am concerned about the possibility of ischemia contributing to his presentation. Plan right and left cardiac catheterization. This will help evaluate coronaries as well as pulmonary pressures and left ventricular filling pressures. The risks and benefits were discussed and the patient agrees to proceed. Hold Lasix the morning of his procedure. Some of dyspnea may be related to OHS and OSA.

## 2014-04-12 NOTE — Progress Notes (Addendum)
HPI: FU coronary artery disease. Patient had PCI of his right coronary artery in September of 2009 in the setting of an inferior infarct. He had a 50% circumflex at that time. Carotid Dopplers in July 2015 showed 50-69% stenosis on the right and left 0-49% stenosis. Followup recommended in 12 months. Echocardiogram in July of 2015 showed normal LV function, grade 2 diastolic dysfunction, mild left atrial enlargement. Nuclear study in July of 2015 showed ischemia in the LAD distribution and scar in the right coronary artery distribution. There was a hypotensive blood pressure response and study felt to be high-risk. We added Lasix at time of last office visit. He was complaining of dyspnea. Since I last saw him, He continues to note dyspnea on exertion. There is no orthopnea or PND but there is pedal edema. He denies chest pain or syncope.   Current Outpatient Prescriptions  Medication Sig Dispense Refill  . acetaminophen (TYLENOL) 500 MG tablet Take 1,000 mg by mouth as needed. Pain      . aspirin 81 MG tablet Take 81 mg by mouth daily.      Marland Kitchen atorvastatin (LIPITOR) 80 MG tablet Take 80 mg by mouth every evening.      Marland Kitchen CALCIUM-MAGNESIUM-ZINC PO Take 1 each by mouth daily.      . Cholecalciferol (VITAMIN D) 2000 UNITS tablet Take 2,000 Units by mouth daily.      Marland Kitchen co-enzyme Q-10 30 MG capsule Take 30 mg by mouth 3 (three) times daily.      . fluticasone (FLONASE) 50 MCG/ACT nasal spray Place 2 sprays into the nose daily.  48 g  1  . furosemide (LASIX) 20 MG tablet Take 1 tablet (20 mg total) by mouth daily.  90 tablet  3  . loratadine (CLARITIN) 10 MG tablet Take 10 mg by mouth daily.       Marland Kitchen losartan (COZAAR) 100 MG tablet TAKE 1 TABLET DAILY  90 tablet  0  . nitroGLYCERIN (NITROSTAT) 0.4 MG SL tablet Place 1 tablet (0.4 mg total) under the tongue every 5 (five) minutes as needed.  25 tablet  12  . omeprazole (PRILOSEC) 20 MG capsule Take 20 mg by mouth every morning.      . STUDY  MEDICATION Take 1 tablet by mouth every evening. [facility] for cholesterol. Study medication Anacetrapib or placebo      . testosterone cypionate (DEPOTESTOTERONE CYPIONATE) 200 MG/ML injection       . vitamin E 100 UNIT capsule Take 100 Units by mouth daily.       No current facility-administered medications for this visit.     Past Medical History  Diagnosis Date  . CAD (coronary artery disease)   . HTN (hypertension)   . Prostatitis   . Hyperlipidemia   . Obesity   . Diverticular disease   . Gallstones   . Hepatitis age 72  . Arthritis   . Sleep apnea     mild, CPAP wasn't indicated (per pt)--2014 mod-severe, CPAP rec  . Benign prostatic hypertrophy     followed by Dr. Gaynelle Arabian  . Cerebrovascular disease   . Sepsis due to Escherichia coli ? 2011    DUE TO URINARY OUTLET OBSTRUCTION /INFECTION  . UTI (urinary tract infection)     secondary  . Allergy     RHINITIS  . 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 Stanford Breed); s/p stents  . GERD (gastroesophageal reflux disease)   . Hypogonadism male  treated by Dr. Gaynelle Arabian  . Erectile dysfunction   . Peripheral vascular disease     02/28/12 MOST RECENT CAROTID DUPLEX--"STABLE MILD CALCIFIED PLAQUE, BILATERALY, STABLE, OVER SERIAL EXAMS"  . 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 IFG (impaired fasting glucose)   . Pneumonia     hx of    Past Surgical History  Procedure Laterality Date  . Total knee arthroplasty  L 4/06, R 4/07  . Vasectomy    . Tonsillectomy    . Transurethral resection of prostate  06/2010  . Knee surgery  05/2010    removing scar tissue R knee (Dr. Wynelle Link)  . Coronary angioplasty with stent placement  01/1997    PTCA/stent to RCA  AND 2009 2ND STENT PLACED  . Joint replacement      both knees  . Total knee revision  09/23/2012    Procedure: TOTAL KNEE REVISION;  Surgeon: Gearlean Alf, MD;  Location: [facility];  Service: Orthopedics;  Laterality: Right;    History   Social  History  . Marital Status: Married    Spouse Name: N/A    Number of Children: 4  . Years of Education: N/A   Occupational History  . CPA    Social History Main Topics  . Smoking status: Former Smoker -- 1.50 packs/day for 30 years    Types: Cigarettes    Quit date: 09/16/1986  . Smokeless tobacco: Never Used     Comment: 35 pack year history, none in 18 years  . Alcohol Use: Yes     Comment: 1 glass of wine twice a week (previously daily)  . Drug Use: No  . Sexual Activity: Not Currently   Other Topics Concern  . Not on file   Social History Narrative   Lives with wife.  4 kids, 1 stepchild (1 in [place], 4 in [place]). No pets. Wife is a diabetic    ROS: no fevers or chills, productive cough, hemoptysis, dysphasia, odynophagia, melena, hematochezia, dysuria, hematuria, rash, seizure activity, orthopnea, PND, pedal edema, claudication. Remaining systems are negative.  Physical Exam: Well-developed obese in no acute distress.  Skin is warm and dry.  HEENT is normal.  Neck is supple.  Chest is clear to auscultation with normal expansion.  Cardiovascular exam is regular rate and rhythm.  Abdominal exam nontender or distended. No masses palpated. Extremities show 1+ edema. neuro grossly intact

## 2014-04-13 ENCOUNTER — Encounter (HOSPITAL_COMMUNITY)
Admission: RE | Disposition: A | Discharge status: Home / Self Care | Payer: Self-pay | Source: Ambulatory Visit | Attending: Thoracic Surgery (Cardiothoracic Vascular Surgery)

## 2014-04-13 ENCOUNTER — Inpatient Hospital Stay (HOSPITAL_COMMUNITY)
Admission: RE | Admit: 2014-04-13 | Discharge: 2014-04-19 | DRG: 233 | Disposition: A | Discharge status: Home / Self Care | Payer: 59 | Source: Ambulatory Visit | Attending: Thoracic Surgery (Cardiothoracic Vascular Surgery) | Admitting: Thoracic Surgery (Cardiothoracic Vascular Surgery)

## 2014-04-13 DIAGNOSIS — E119 Type 2 diabetes mellitus without complications: Secondary | ICD-10-CM | POA: Diagnosis present

## 2014-04-13 DIAGNOSIS — E669 Obesity, unspecified: Secondary | ICD-10-CM

## 2014-04-13 DIAGNOSIS — Z96659 Presence of unspecified artificial knee joint: Secondary | ICD-10-CM

## 2014-04-13 DIAGNOSIS — I25119 Atherosclerotic heart disease of native coronary artery with unspecified angina pectoris: Secondary | ICD-10-CM

## 2014-04-13 DIAGNOSIS — I739 Peripheral vascular disease, unspecified: Secondary | ICD-10-CM | POA: Diagnosis present

## 2014-04-13 DIAGNOSIS — R7301 Impaired fasting glucose: Secondary | ICD-10-CM

## 2014-04-13 DIAGNOSIS — Z823 Family history of stroke: Secondary | ICD-10-CM

## 2014-04-13 DIAGNOSIS — I2510 Atherosclerotic heart disease of native coronary artery without angina pectoris: Principal | ICD-10-CM | POA: Diagnosis present

## 2014-04-13 DIAGNOSIS — R0602 Shortness of breath: Secondary | ICD-10-CM

## 2014-04-13 DIAGNOSIS — G4733 Obstructive sleep apnea (adult) (pediatric): Secondary | ICD-10-CM | POA: Diagnosis present

## 2014-04-13 DIAGNOSIS — E7800 Pure hypercholesterolemia, unspecified: Secondary | ICD-10-CM | POA: Diagnosis present

## 2014-04-13 DIAGNOSIS — T82897A Other specified complication of cardiac prosthetic devices, implants and grafts, initial encounter: Secondary | ICD-10-CM | POA: Diagnosis present

## 2014-04-13 DIAGNOSIS — H9190 Unspecified hearing loss, unspecified ear: Secondary | ICD-10-CM | POA: Diagnosis present

## 2014-04-13 DIAGNOSIS — Z79899 Other long term (current) drug therapy: Secondary | ICD-10-CM

## 2014-04-13 DIAGNOSIS — Y831 Surgical operation with implant of artificial internal device as the cause of abnormal reaction of the patient, or of later complication, without mention of misadventure at the time of the procedure: Secondary | ICD-10-CM | POA: Diagnosis present

## 2014-04-13 DIAGNOSIS — I509 Heart failure, unspecified: Secondary | ICD-10-CM

## 2014-04-13 DIAGNOSIS — Z9861 Coronary angioplasty status: Secondary | ICD-10-CM

## 2014-04-13 DIAGNOSIS — I10 Essential (primary) hypertension: Secondary | ICD-10-CM | POA: Diagnosis present

## 2014-04-13 DIAGNOSIS — I5033 Acute on chronic diastolic (congestive) heart failure: Secondary | ICD-10-CM

## 2014-04-13 DIAGNOSIS — Z9852 Vasectomy status: Secondary | ICD-10-CM

## 2014-04-13 DIAGNOSIS — Z8249 Family history of ischemic heart disease and other diseases of the circulatory system: Secondary | ICD-10-CM

## 2014-04-13 DIAGNOSIS — J9819 Other pulmonary collapse: Secondary | ICD-10-CM | POA: Diagnosis not present

## 2014-04-13 DIAGNOSIS — Z7982 Long term (current) use of aspirin: Secondary | ICD-10-CM

## 2014-04-13 DIAGNOSIS — I219 Acute myocardial infarction, unspecified: Secondary | ICD-10-CM

## 2014-04-13 DIAGNOSIS — I252 Old myocardial infarction: Secondary | ICD-10-CM

## 2014-04-13 DIAGNOSIS — K5730 Diverticulosis of large intestine without perforation or abscess without bleeding: Secondary | ICD-10-CM | POA: Diagnosis present

## 2014-04-13 DIAGNOSIS — E291 Testicular hypofunction: Secondary | ICD-10-CM | POA: Diagnosis present

## 2014-04-13 DIAGNOSIS — K219 Gastro-esophageal reflux disease without esophagitis: Secondary | ICD-10-CM | POA: Diagnosis present

## 2014-04-13 DIAGNOSIS — E785 Hyperlipidemia, unspecified: Secondary | ICD-10-CM | POA: Diagnosis present

## 2014-04-13 DIAGNOSIS — I5031 Acute diastolic (congestive) heart failure: Secondary | ICD-10-CM

## 2014-04-13 DIAGNOSIS — Z6841 Body Mass Index (BMI) 40.0 and over, adult: Secondary | ICD-10-CM

## 2014-04-13 DIAGNOSIS — Z951 Presence of aortocoronary bypass graft: Secondary | ICD-10-CM

## 2014-04-13 DIAGNOSIS — Z87891 Personal history of nicotine dependence: Secondary | ICD-10-CM

## 2014-04-13 DIAGNOSIS — N400 Enlarged prostate without lower urinary tract symptoms: Secondary | ICD-10-CM | POA: Diagnosis present

## 2014-04-13 DIAGNOSIS — D62 Acute posthemorrhagic anemia: Secondary | ICD-10-CM | POA: Diagnosis not present

## 2014-04-13 HISTORY — PX: LEFT AND RIGHT HEART CATHETERIZATION WITH CORONARY ANGIOGRAM: SHX5449

## 2014-04-13 HISTORY — DX: Presence of aortocoronary bypass graft: Z95.1

## 2014-04-13 LAB — POCT I-STAT 3, VENOUS BLOOD GAS (G3P V)
ACID-BASE EXCESS: 1 mmol/L (ref 0.0–2.0)
BICARBONATE: 25.7 meq/L — AB (ref 20.0–24.0)
O2 SAT: 70 %
PO2 VEN: 37 mmHg (ref 30.0–45.0)
TCO2: 27 mmol/L (ref 0–100)
pCO2, Ven: 42.5 mmHg — ABNORMAL LOW (ref 45.0–50.0)
pH, Ven: 7.391 — ABNORMAL HIGH (ref 7.250–7.300)

## 2014-04-13 LAB — POCT ACTIVATED CLOTTING TIME: Activated Clotting Time: 146 seconds

## 2014-04-13 LAB — MRSA PCR SCREENING: MRSA by PCR: NEGATIVE

## 2014-04-13 SURGERY — LEFT AND RIGHT HEART CATHETERIZATION WITH CORONARY ANGIOGRAM

## 2014-04-13 MED ORDER — FENTANYL CITRATE 0.05 MG/ML IJ SOLN
INTRAMUSCULAR | Status: AC
  Filled 2014-04-13: qty 2

## 2014-04-13 MED ORDER — VERAPAMIL HCL 2.5 MG/ML IV SOLN
INTRAVENOUS | Status: AC
  Filled 2014-04-13: qty 2

## 2014-04-13 MED ORDER — NITROGLYCERIN 1 MG/10 ML FOR IR/CATH LAB
INTRA_ARTERIAL | Status: AC
  Filled 2014-04-13: qty 10

## 2014-04-13 MED ORDER — NITROGLYCERIN 0.4 MG SL SUBL: 0.4000 mg | SUBLINGUAL_TABLET | SUBLINGUAL | Status: DC | PRN

## 2014-04-13 MED ORDER — ASPIRIN 81 MG PO CHEW
81.0000 mg | CHEWABLE_TABLET | ORAL | Status: AC
  Administered 2014-04-13: 81 mg via ORAL

## 2014-04-13 MED ORDER — ONDANSETRON HCL 4 MG/2ML IJ SOLN: 4.0000 mg | Freq: Four times a day (QID) | INTRAMUSCULAR | Status: DC | PRN

## 2014-04-13 MED ORDER — ASPIRIN 81 MG PO CHEW
CHEWABLE_TABLET | ORAL | Status: AC
  Filled 2014-04-13: qty 1

## 2014-04-13 MED ORDER — LORATADINE 10 MG PO TABS
10.0000 mg | ORAL_TABLET | Freq: Every day | ORAL | Status: DC
  Filled 2014-04-13 (×2): qty 1

## 2014-04-13 MED ORDER — TESTOSTERONE CYPIONATE 200 MG/ML IM SOLN: 200.0000 mg | INTRAMUSCULAR | Status: DC

## 2014-04-13 MED ORDER — ATORVASTATIN CALCIUM 80 MG PO TABS
80.0000 mg | ORAL_TABLET | Freq: Every evening | ORAL | Status: DC
  Administered 2014-04-13 – 2014-04-14 (×2): 80 mg via ORAL
  Filled 2014-04-13 (×3): qty 1

## 2014-04-13 MED ORDER — MIDAZOLAM HCL 2 MG/2ML IJ SOLN
INTRAMUSCULAR | Status: AC
  Filled 2014-04-13: qty 2

## 2014-04-13 MED ORDER — VITAMIN D 50 MCG (2000 UT) PO TABS: 2000.0000 [IU] | ORAL_TABLET | Freq: Every day | ORAL | Status: DC

## 2014-04-13 MED ORDER — HEPARIN SODIUM (PORCINE) 1000 UNIT/ML IJ SOLN
INTRAMUSCULAR | Status: AC
Start: 2014-04-13 — End: 2014-04-13
  Filled 2014-04-13: qty 1

## 2014-04-13 MED ORDER — SODIUM CHLORIDE 0.9 % IV SOLN: 250.0000 mL | INTRAVENOUS | Status: DC | PRN

## 2014-04-13 MED ORDER — PANTOPRAZOLE SODIUM 40 MG PO TBEC
40.0000 mg | DELAYED_RELEASE_TABLET | Freq: Every day | ORAL | Status: DC
  Administered 2014-04-14: 40 mg via ORAL
  Filled 2014-04-13: qty 1

## 2014-04-13 MED ORDER — LIDOCAINE HCL (PF) 1 % IJ SOLN
INTRAMUSCULAR | Status: AC
  Filled 2014-04-13: qty 30

## 2014-04-13 MED ORDER — SODIUM CHLORIDE 0.9 % IV SOLN
INTRAVENOUS | Status: DC
  Administered 2014-04-13: 13:00:00 via INTRAVENOUS

## 2014-04-13 MED ORDER — LOSARTAN POTASSIUM 50 MG PO TABS: 100.0000 mg | ORAL_TABLET | Freq: Every day | ORAL | Status: DC

## 2014-04-13 MED ORDER — SODIUM CHLORIDE 0.9 % IJ SOLN
3.0000 mL | INTRAMUSCULAR | Status: DC | PRN
  Administered 2014-04-13: 3 mL via INTRAVENOUS

## 2014-04-13 MED ORDER — SODIUM CHLORIDE 0.9 % IJ SOLN: 3.0000 mL | Freq: Two times a day (BID) | INTRAMUSCULAR | Status: DC

## 2014-04-13 MED ORDER — WHITE PETROLATUM GEL
Status: AC
  Administered 2014-04-13: 22:00:00
  Filled 2014-04-13: qty 5

## 2014-04-13 MED ORDER — SODIUM CHLORIDE 0.9 % IV SOLN: INTRAVENOUS | Status: AC

## 2014-04-13 MED ORDER — VITAMIN D3 25 MCG (1000 UNIT) PO TABS
2000.0000 [IU] | ORAL_TABLET | Freq: Every day | ORAL | Status: DC
  Filled 2014-04-13 (×2): qty 2

## 2014-04-13 MED ORDER — FLUTICASONE PROPIONATE 50 MCG/ACT NA SUSP
2.0000 | Freq: Every day | NASAL | Status: DC
  Filled 2014-04-13: qty 16

## 2014-04-13 MED ORDER — ASPIRIN EC 81 MG PO TBEC
81.0000 mg | DELAYED_RELEASE_TABLET | Freq: Every day | ORAL | Status: DC
  Administered 2014-04-14: 81 mg via ORAL
  Filled 2014-04-13 (×2): qty 1

## 2014-04-13 MED ORDER — HEPARIN (PORCINE) IN NACL 2-0.9 UNIT/ML-% IJ SOLN
INTRAMUSCULAR | Status: AC
  Filled 2014-04-13: qty 1000

## 2014-04-13 MED ORDER — ACETAMINOPHEN 500 MG PO TABS
1000.0000 mg | ORAL_TABLET | Freq: Every evening | ORAL | Status: DC | PRN
  Administered 2014-04-13: 1000 mg via ORAL
  Filled 2014-04-13: qty 2

## 2014-04-13 MED ORDER — LOSARTAN POTASSIUM 50 MG PO TABS
100.0000 mg | ORAL_TABLET | Freq: Every day | ORAL | Status: DC
  Administered 2014-04-14: 100 mg via ORAL
  Filled 2014-04-13 (×2): qty 2

## 2014-04-13 MED ORDER — ENOXAPARIN SODIUM 120 MG/0.8ML ~~LOC~~ SOLN
120.0000 mg | Freq: Two times a day (BID) | SUBCUTANEOUS | Status: DC
  Administered 2014-04-14: 120 mg via SUBCUTANEOUS
  Filled 2014-04-13 (×3): qty 0.8

## 2014-04-13 NOTE — CV Procedure (Signed)
     Left and Right Heart Catheterization with Coronary Angiography  Report  Jeffrey Wiggins  73 y.o.  male 1941-03-18  Procedure Date: 04/13/2014 Referring Physician: Kirk Ruths, M.D. Primary Cardiologist: Kirk Ruths, M.D.  INDICATIONS: High-risk myocardial perfusion study, and exertional angina (class III)  PROCEDURE: 1. Left heart catheterization; 2. Left ventriculography; 3. Selective coronary angiography; 4. Right heart catheterization  CONSENT:  The risks, benefits, and details of the procedure were explained in detail to the patient. Risks including death, stroke, heart attack, kidney injury, allergy, limb ischemia, bleeding and radiation injury were discussed.  The patient verbalized understanding and wanted to proceed.  Informed written consent was obtained.  PROCEDURE TECHNIQUE:  After Xylocaine anesthesia a 5 French Slender sheath was placed in the right radial artery with an angiocath and the modified Seldinger technique.  Right heart catheterization was performed via the right antecubital vein. The previously placed 18-gauge Angiocath was exchanged over a guidewire for a brachial 5 French sheath using double glove technique. We then use a 5 [place] balloontipped catheter to perform right heart catheterization. Coronary angiography was then done using a 5 F JR 4, JL 3.5 cm, and 3.5 cm EBU catheter.  Left ventriculography was done using the JR 4 catheter and hand injection.   Review of the images demonstrated severe distal left main disease. The case was terminated. The patient was informed he would need to be admitted to the hospital. TCTS( Dr. Roxy Manns) was consulted.    CONTRAST:  Total of 105 cc.  COMPLICATIONS:  None   HEMODYNAMICS:  Aortic pressure 134/79 mmHg; LV pressure 134/29 mmHg; LVEDP 32 mm mercury; RA 11 mmHg; RV 153/14 mmHg; PA 145/16 mmHg; PCWP(mean) 19 mm mercury; Cardiac Output 7.37 L per minute; AV gradient: none  ANGIOGRAPHIC DATA:   The left main  coronary artery is heavily calcified and contains a distal 85-90% obstruction.  The left anterior descending artery is LAD is large and reaches the left ventricular apex. There is 50% stenosis in the mid vessel is heavily calcified segment prior to the origin of the first septal perforator and first diagonal. The first diagonal contains an 80% ostial stenosis..  The left circumflex artery is heavily calcified proximally. A large branching first obtuse marginal is followed by the continuation of the circumflex before 2 small distal obtuse marginal branches arise. The circumflex just distal to the origin of the first obtuse marginal contains segmental 50% narrowing..  The right coronary artery is dominant. A stent sandwich is present in the midsegment. There is 50% narrowing near the origin of the acute marginal branch which represents an in-stent restenotic segment. No critical obstruction is noted in the right coronary.  LEFT VENTRICULOGRAM:  Left ventricular angiogram was done in the 30 RAO projection and revealed overall normal function with an ejection fraction of approximately 50%.   IMPRESSIONS:  1. Heavily calcified and critically obstructed distal left main 85-90% stenosis. 2. Moderate in-stent restenosis in the mid RCA, 50% 3. 30-50% proximal to mid LAD with a 70-80% ostial diagonal to 4. Overall normal to low normal LV systolic function with evidence of diastolic failure (LVEDP 32 mm mercury)   RECOMMENDATION:  Admit to hospital TCTS has been constantly, Dr. Roxy Manns.

## 2014-04-13 NOTE — Interval H&P Note (Signed)
Cath Lab Visit (complete for each Cath Lab visit)  Clinical Evaluation Leading to the Procedure:   ACS: No.  Non-ACS:    Anginal Classification: CCS III  Anti-ischemic medical therapy: Minimal Therapy (1 class of medications)  Non-Invasive Test Results: High-risk stress test findings: cardiac mortality >3%/year  Prior CABG: No previous CABG      History and Physical Interval Note:  04/13/2014 4:41 PM  Jeffrey Wiggins  has presented today for surgery, with the diagnosis of adnormal nuc/shortness of breath  The various methods of treatment have been discussed with the patient and family. After consideration of risks, benefits and other options for treatment, the patient has consented to  Procedure(s): LEFT HEART CATHETERIZATION WITH CORONARY ANGIOGRAM (N/A) as a surgical intervention .  The patient's history has been reviewed, patient examined, no change in status, stable for surgery.  I have reviewed the patient's chart and labs.  Questions were answered to the patient's satisfaction.     Sinclair Grooms

## 2014-04-13 NOTE — Progress Notes (Signed)
ANTICOAGULATION CONSULT NOTE - Initial Consult  Pharmacy Consult for Enoxaparin Indication: chest pain/ACS  No Known Allergies  Patient Measurements: Height: 5\' 7"  (170.2 cm) Weight: 282 lb 6.6 oz (128.1 kg) IBW/kg (Calculated) : 66.1  Vital Signs: Temp: 99.3 F (37.4 C) (07/29 2000) Temp src: Oral (07/29 2000) BP: 162/91 mmHg (07/29 2100) Pulse Rate: 85 (07/29 2100)  Labs:  Recent Labs  04/12/14 1051  HGB 13.0  HCT 40.1  PLT 296  LABPROT 13.4  INR 1.02  CREATININE 0.99    Estimated Creatinine Clearance: 86.7 ml/min (by C-G formula based on Cr of 0.99).   Medical History: Past Medical History  Diagnosis Date  . CAD (coronary artery disease)   . HTN (hypertension)   . Prostatitis   . Hyperlipidemia   . Obesity   . Diverticular disease   . Gallstones   . Hepatitis age 73  . Arthritis   . Sleep apnea     mild, CPAP wasn't indicated (per pt)--2014 mod-severe, CPAP rec  . Benign prostatic hypertrophy     followed by Dr. Gaynelle Arabian  . Cerebrovascular disease   . Sepsis due to Escherichia coli ? 2011    DUE TO URINARY OUTLET OBSTRUCTION /INFECTION  . UTI (urinary tract infection)     secondary  . Allergy     RHINITIS  . 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 Stanford Breed); s/p stents  . GERD (gastroesophageal reflux disease)   . Hypogonadism male     treated by Dr. Gaynelle Arabian  . Erectile dysfunction   . Peripheral vascular disease     02/28/12 MOST RECENT CAROTID DUPLEX--"STABLE MILD CALCIFIED PLAQUE, BILATERALY, STABLE, OVER SERIAL EXAMS"  . 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 IFG (impaired fasting glucose)   . Pneumonia     hx of    Medications:  Prescriptions prior to admission  Medication Sig Dispense Refill  . acetaminophen (TYLENOL) 500 MG tablet Take 1,000 mg by mouth at bedtime as needed for mild pain.       Marland Kitchen aspirin EC 81 MG tablet Take 81 mg by mouth daily.      Marland Kitchen atorvastatin (LIPITOR) 80 MG tablet Take 80 mg by mouth every  evening.      Marland Kitchen CALCIUM-MAGNESIUM-ZINC PO Take 1 each by mouth daily.      . Cholecalciferol (VITAMIN D) 2000 UNITS tablet Take 2,000 Units by mouth daily.      Marland Kitchen co-enzyme Q-10 30 MG capsule Take 30 mg by mouth 3 (three) times daily.      . fluticasone (FLONASE) 50 MCG/ACT nasal spray Place 2 sprays into the nose daily.  48 g  1  . furosemide (LASIX) 20 MG tablet Take 1 tablet (20 mg total) by mouth daily.  90 tablet  3  . loratadine (CLARITIN) 10 MG tablet Take 10 mg by mouth daily.       Marland Kitchen losartan (COZAAR) 100 MG tablet Take 100 mg by mouth daily.      . nitroGLYCERIN (NITROSTAT) 0.4 MG SL tablet Place 0.4 mg under the tongue every 5 (five) minutes as needed for chest pain.      Marland Kitchen omeprazole (PRILOSEC) 20 MG capsule Take 20 mg by mouth every other day.       . STUDY MEDICATION Take 1 tablet by mouth every evening. [facility] for cholesterol. Study medication Anacetrapib or placebo      . testosterone cypionate (DEPOTESTOTERONE CYPIONATE) 200 MG/ML injection  Inject 200 mg into the muscle every 14 (fourteen) days.         Assessment: 73 yo M admitted 04/13/2014 s/p cath. Pharmacy consulted to dose enoxaparin pending evaluation by [facility] for cardiac surgery due to significant distal left main disease.  PMH: CAD, HTN, PVD, BPH, Hypogonadism/ED, Hepatitis.  Coag:  CAD, s/p cath,   Goal of Therapy:  Heparin level 0.3-0.7 units/ml Monitor platelets by anticoagulation protocol: Yes   Plan:  Lovenox 120 mg sq q12h, start at 6am, 7/30 Follow up CBC q72h.  Thank you for allowing pharmacy to be a part of this patients care team.  Rowe Robert Pharm.D., BCPS, AQ-Cardiology Clinical Pharmacist 04/13/2014 9:21 PM Pager: [phone] Phone: [phone]

## 2014-04-13 NOTE — Consult Note (Addendum)
[address]       [place] [address]             [phone]          CARDIOTHORACIC SURGERY CONSULTATION REPORT  PCP is KNAPP,EVE A, MD Referring Provider is Blenda Bridegroom, Lynnell Dike, MD Primary Cardiologist is CRENSHAW, Denice Bors, MD  Reason for consultation:  Left Main Disease  HPI:  Patient is a 73 year old morbidly obese white male from [place] with known history of coronary artery disease, hypertension, and hyperlipidemia referred for surgical treatment of left main disease. The patient's cardiac history dates back to 1998 when he suffered an inferior wall myocardial infarction. He was treated with PCI and stenting. He did well until 2009 when he suffered another inferior wall myocardial infarction. He was again treated with PCI and stenting of the right coronary artery. He continued to do well from a cardiac standpoint until approximately 6-8 weeks ago when he began to experience progressive symptoms of exertional shortness of breath. Initially symptoms occurred only with relatively strenuous exertion, such as when the patient was at the gym performing exercise. However, symptoms progressed, and more recently the patient has developed exertional shortness of breath with relatively mild activity. With more strenuous activities he has had some mild discomfort in his chest. He denies any symptoms at rest. He has not had any nocturnal symptoms. He has experienced fluid retention and lower extremity edema. He denies any PND, orthopnea, palpitations, dizzy spells, or syncope.  He was seen in followup by Dr. Stanford Breed on 04/01/2014. An echocardiogram and nuclear stress test were performed.  Echocardiogram revealed normal left ventricular function but the nuclear stress test was high-risk positive with findings consistent with anterior wall ischemia. The patient was brought in for elective diagnostic cardiac catheterization today by Dr. Tamala Julian. Catheterization revealed severe left main  disease. Cardiothoracic surgical consultation was requested.  The patient lives at home with his wife. 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 He is semiretired. He has been overweight for most of his life. He does attempt to exercise on a regular basis. He is limited somewhat by chronic pain in his knees, more so on the right than the left. He has just recently developed progressive exertional shortness of breath over the past 6-8 weeks.  Past Medical History  Diagnosis Date  . CAD (coronary artery disease)   . HTN (hypertension)   . Prostatitis   . Hyperlipidemia   . Obesity   . Diverticular disease   . Gallstones   . Hepatitis age 52  . Arthritis   . Sleep apnea     mild, CPAP wasn't indicated (per pt)--2014 mod-severe, CPAP rec  . Benign prostatic hypertrophy     followed by Dr. Gaynelle Arabian  . Cerebrovascular disease   . Sepsis due to Escherichia coli ? 2011    DUE TO URINARY OUTLET OBSTRUCTION /INFECTION  . UTI (urinary tract infection)     secondary  . Allergy     RHINITIS  . 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 Stanford Breed); s/p stents  . GERD (gastroesophageal reflux disease)   . Hypogonadism male     treated by Dr. Gaynelle Arabian  . Erectile dysfunction   . Peripheral vascular disease     02/28/12 MOST RECENT CAROTID DUPLEX--"STABLE MILD CALCIFIED PLAQUE, BILATERALY, STABLE, OVER SERIAL EXAMS"  . 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 IFG (impaired fasting glucose)   .  Pneumonia     hx of    Past Surgical History  Procedure Laterality Date  . Total knee arthroplasty  L 4/06, R 4/07  . Vasectomy    . Tonsillectomy    . Transurethral resection of prostate  06/2010  . Knee surgery  05/2010    removing scar tissue R knee (Dr. Wynelle Link)  . Coronary angioplasty with stent placement  01/1997    PTCA/stent to RCA  AND 2009 2ND STENT PLACED  . Joint replacement      both knees  . Total knee revision  09/23/2012    Procedure: TOTAL KNEE REVISION;   Surgeon: Gearlean Alf, MD;  Location: [facility];  Service: Orthopedics;  Laterality: Right;    Family History  Problem Relation Age of Onset  . Coronary artery disease Mother     deceased  . Dementia Mother   . Atrial fibrillation Mother   . Stroke Mother     related to atrial fib  . Coronary artery disease Father     deceased  . Hypertension Father   . Heart disease Father   . Cancer Paternal Uncle     colon; died in his 72's  . Colon cancer Paternal Uncle   . Hypertension Paternal Grandmother   . Heart disease Paternal Grandmother   . Hypertension Paternal Grandfather   . Heart disease Paternal Grandfather   . Diabetes Neg Hx     History   Social History  . Marital Status: Married    Spouse Name: N/A    Number of Children: 4  . Years of Education: N/A   Occupational History  . CPA    Social History Main Topics  . Smoking status: Former Smoker -- 1.50 packs/day for 30 years    Types: Cigarettes    Quit date: 09/16/1986  . Smokeless tobacco: Never Used     Comment: 35 pack year history, none in 18 years  . Alcohol Use: Yes     Comment: 1 glass of wine twice a week (previously daily)  . Drug Use: No  . Sexual Activity: Not Currently   Other Topics Concern  . Not on file   Social History Narrative   Lives with wife.  4 kids, 1 stepchild (1 in [place], 4 in [place]). No pets. Wife is a diabetic    Prior to Admission medications   Medication Sig Start Date End Date Taking? Authorizing Provider  acetaminophen (TYLENOL) 500 MG tablet Take 1,000 mg by mouth at bedtime as needed for mild pain.    Yes Historical Provider, MD  aspirin EC 81 MG tablet Take 81 mg by mouth daily.   Yes Historical Provider, MD  atorvastatin (LIPITOR) 80 MG tablet Take 80 mg by mouth every evening.   Yes Historical Provider, MD  CALCIUM-MAGNESIUM-ZINC PO Take 1 each by mouth daily.   Yes Historical Provider, MD  Cholecalciferol (VITAMIN D) 2000 UNITS tablet Take 2,000 Units by mouth daily.   Yes  Historical Provider, MD  co-enzyme Q-10 30 MG capsule Take 30 mg by mouth 3 (three) times daily.   Yes Historical Provider, MD  fluticasone (FLONASE) 50 MCG/ACT nasal spray Place 2 sprays into the nose daily. 05/24/13  Yes Rita Ohara, MD  furosemide (LASIX) 20 MG tablet Take 1 tablet (20 mg total) by mouth daily. 04/01/14  Yes Lelon Perla, MD  loratadine (CLARITIN) 10 MG tablet Take 10 mg by mouth daily.    Yes Historical Provider, MD  losartan (COZAAR) 100  MG tablet Take 100 mg by mouth daily.   Yes Historical Provider, MD  nitroGLYCERIN (NITROSTAT) 0.4 MG SL tablet Place 0.4 mg under the tongue every 5 (five) minutes as needed for chest pain.   Yes Historical Provider, MD  omeprazole (PRILOSEC) 20 MG capsule Take 20 mg by mouth every other day.    Yes Historical Provider, MD  STUDY MEDICATION Take 1 tablet by mouth every evening. [facility] for cholesterol. Study medication Anacetrapib or placebo   Yes Historical Provider, MD  testosterone cypionate (DEPOTESTOTERONE CYPIONATE) 200 MG/ML injection Inject 200 mg into the muscle every 14 (fourteen) days.  01/12/14  Yes Historical Provider, MD    Current Facility-Administered Medications  Medication Dose Route Frequency Provider Last Rate Last Dose  . 0.9 %  sodium chloride infusion   Intravenous Continuous Sinclair Grooms, MD 75 mL/hr at 04/13/14 2015    . acetaminophen (TYLENOL) tablet 1,000 mg  1,000 mg Oral QHS PRN Belva Crome III, MD   1,000 mg at 04/13/14 2109  . aspirin 81 MG chewable tablet           . [START ON 04/14/2014] aspirin EC tablet 81 mg  81 mg Oral Daily Belva Crome III, MD      . atorvastatin (LIPITOR) tablet 80 mg  80 mg Oral QPM Belva Crome III, MD   80 mg at 04/13/14 2109  . [START ON 04/14/2014] cholecalciferol (VITAMIN D) tablet 2,000 Units  2,000 Units Oral Daily Belva Crome III, MD      . Derrill Memo ON 04/14/2014] enoxaparin (LOVENOX) injection 120 mg  120 mg Subcutaneous Q12H Sinclair Grooms, MD      .  Derrill Memo ON 04/14/2014] fluticasone (FLONASE) 50 MCG/ACT nasal spray 2 spray  2 spray Each Nare Daily Sinclair Grooms, MD      . Derrill Memo ON 04/14/2014] loratadine (CLARITIN) tablet 10 mg  10 mg Oral Daily Belva Crome III, MD      . Derrill Memo ON 04/14/2014] losartan (COZAAR) tablet 100 mg  100 mg Oral Daily Belva Crome III, MD      . nitroGLYCERIN (NITROSTAT) SL tablet 0.4 mg  0.4 mg Sublingual Q5 min PRN Belva Crome III, MD      . ondansetron [facility]) injection 4 mg  4 mg Intravenous Q6H PRN Sinclair Grooms, MD      . Derrill Memo ON 04/14/2014] pantoprazole (PROTONIX) EC tablet 40 mg  40 mg Oral Daily Sinclair Grooms, MD      . Derrill Memo ON 04/23/2014] testosterone cypionate (DEPOTESTOTERONE CYPIONATE) injection 200 mg  200 mg Intramuscular Q14 Days Belva Crome III, MD        No Known Allergies    Review of Systems:   General:  normal appetite, normal energy, + weight gain, no weight loss, no fever  Cardiac:  + chest pain with exertion, no chest pain at rest, + SOB with mild exertion, no resting SOB, no PND, no orthopnea, no palpitations, no arrhythmia, no atrial fibrillation, + LE edema, no dizzy spells, no syncope  Respiratory:  + exertional shortness of breath, no home oxygen, no productive cough, + dry cough, no bronchitis, no wheezing, no hemoptysis, no asthma, no pain with inspiration or cough, + sleep apnea, + CPAP at night  GI:   no difficulty swallowing, no reflux, no frequent heartburn, no hiatal hernia, no abdominal pain, no constipation, no diarrhea, no hematochezia, no hematemesis, no  melena  GU:   no dysuria,  no frequency, no urinary tract infection, no hematuria, + enlarged prostate, no kidney stones, no kidney disease  Vascular:  no pain suggestive of claudication, no pain in feet, no leg cramps, no varicose veins, no DVT, no non-healing foot ulcer  Neuro:   no stroke, no TIA's, no seizures, no headaches, no temporary blindness one eye,  no slurred speech, no peripheral neuropathy, +  chronic pain in knees, no instability of gait, no memory/cognitive dysfunction  Musculoskeletal: + arthritis particularly in the knees, no joint swelling, no myalgias, very mild difficulty walking, normal mobility   Skin:   no rash, no itching, no skin infections, no pressure sores or ulcerations  Psych:   no anxiety, no depression, no nervousness, no unusual recent stress  Eyes:   no blurry vision, no floaters, no recent vision changes, + wears glasses or contacts  ENT:   no hearing loss, no loose or painful teeth, no dentures, last saw dentist several years ago  Hematologic:  no easy bruising, no abnormal bleeding, no clotting disorder, no frequent epistaxis  Endocrine:  no diabetes, does not check CBG's at home     Physical Exam:   BP 168/87  Pulse 84  Temp(Src) 99.3 F (37.4 C) (Oral)  Resp 19  Ht 5\' 7"  (1.702 m)  Wt 128.1 kg (282 lb 6.6 oz)  BMI 44.22 kg/m2  SpO2 93%  General:  Obese but o/w well-appearing  HEENT:  Unremarkable   Neck:   no JVD, no bruits, no adenopathy   Chest:   clear to auscultation, symmetrical breath sounds, no wheezes, no rhonchi   CV:   RRR, no murmur   Abdomen:  soft, non-tender, no masses   Extremities:  warm, well-perfused, pulses diminished, + lower extremity edema  Rectal/GU  Deferred  Neuro:   Grossly non-focal and symmetrical throughout  Skin:   Clean and dry, no rashes, no breakdown  Diagnostic Tests:  Cardiology Nuclear Med Study  BERTRAN ZEIMET is a 73 y.o. male     MRN : [id number]     DOB:  Jan 22, 1941  Procedure Date: 04/08/2014  Nuclear Med Background Indication for Stress Test:  Evaluation for Ischemia and Stent  Patency History:  CAD;MI-1998 AND  2009;CABG-09/2007;STENT/PTCA-01/1997;bradycardia;CHF;Last NUC MPI on 06/17/2011;EF=52%; Cardiac Risk Factors: Carotid Disease, CVA, Family History - CAD, History of Smoking, Hypertension, Lipids, Obesity, PVD and PVC's  Symptoms:  Chest Pain and DOE   Nuclear  Pre-Procedure Caffeine/Decaff Intake:  7:00pm NPO After: 5:00am   IV Site: R Forearm  IV 0.9% NS with Angio Cath:  22g  Chest Size (in):  56"  IV Started by: Rolene Course, RN  Height: 5\' 7"  (1.702 m)  Cup Size: n/a  BMI:  Body mass index is 44.63 kg/(m^2). Weight:  285 lb (129.275 kg)   Tech Comments:  n/a    Nuclear Med Study 1 or 2 day study: 1 day  Stress Test Type:  Stress  Order Authorizing Provider:  Kirk Ruths, MD   Resting Radionuclide: Technetium 1m Sestamibi  Resting  Radionuclide Dose: 10.2 mCi   Stress Radionuclide:  Technetium 40m Sestamibi  Stress  Radionuclide Dose: 30.4 mCi           Stress Protocol Rest HR: 84 Stress HR: 164  Rest BP: 152/87 Stress BP:158/75  Exercise Time (min): 5:16 METS: 6.10   Predicted Max HR: 148 bpm % Max HR: 110.81 bpm Rate Pressure Product: 41324  Dose of Adenosine (mg):  n/a Dose of Lexiscan: n/a mg  Dose of Atropine (mg): n/a Dose of Dobutamine: n/a mcg/kg/min (at max HR)  Stress Test Technologist: Mellody Memos, CCT Nuclear  Technologist: Imagene Riches, CNMT   Rest Procedure:  Myocardial perfusion imaging was performed at  rest 45 minutes following the intravenous administration of  Technetium 22m Sestamibi. Stress Procedure:  The patient performed treadmill exercise using a Bruce  Protocol for 5 minutes 16 seconds. The patient stopped  due to shortness of breath and fatigue. Patient stated that he  did experience some chest tightness at the peak level of  exercise. On a scale of 1-10 he said a 5.  There were significant ST-T wave changes.  Technetium 42m Sestamibi was injected IV at  peak exercise and myocardial perfusion imaging was performed  after a brief delay.  Transient Ischemic Dilatation (Normal <1.22):  1.26  QGS EDV:  156 ml QGS ESV:  79 ml LV Ejection Fraction: 49%      Rest ECG: NSR - Normal EKG and PACs  Stress ECG: Significant ST abnormalities consistent with  ischemia. 1-2 mm horizontal ST  depression in the inferior leads  and V4-V6  QPS Raw Data Images:  Normal; no motion artifact; normal heart/lung  ratio. Stress Images:  There is decreased uptake in the apex and in the  inferior wall. Rest Images:  There is little change in in the inferior defect.  There is complete resolution of the apical defect. Subtraction (SDS):  These findings are consistent with ischemia  in the LAD artery distribution and scar in the right coronary  artery distribution.. LV Wall Motion:  Normal apical wall motion. Mild inferior  hypokinesis. Mildly depressed overall systolic function, EF 65%.  Impression Exercise Capacity:  Fair exercise capacity. BP Response:  Hypotensive blood pressure response. Clinical Symptoms:  Typical chest pain. ECG Impression:  There are scattered PACs. Comparison with Prior Nuclear Study: Compared to 2012, the  reversible apical defect is new.   Overall Impression:  High risk stress nuclear study with ischemia in the LAD distribution and old scar in the RCA distribution, new since 2012.Sanda Klein, MD  04/08/2014 1:06 PM    Transthoracic Echocardiography  Patient:    Padraic, Marinos MR #:       [id number] Study Date: 04/11/2014 Gender:     M Age:        37 Height:     170.2 cm Weight:     129.3 kg BSA:        2.54 m^2 Pt. Status: Room:   [address]  [place], [place] [id number]  ATTENDING    Glenetta Hew  PERFORMING   [facility], Outpatient  SONOGRAPHER  [facility], RDCS  cc:  ------------------------------------------------------------------- LV EF: 55% -   60%  ------------------------------------------------------------------- Indications:      786.05 Dyspnea.  ------------------------------------------------------------------- History:   PMH:   Coronary artery disease.  PMH:   Myocardial infarction.  Risk factors:  CABG (2009), Coronary Stenting, Edema, Hepatitis, Obstructive  Sleep Apnea with CPAP, Peripheral Vascular Disease, GERD. Former tobacco use. Hypertension. Dyslipidemia.  ------------------------------------------------------------------- Study Conclusions  - Left ventricle: The cavity size was normal. Wall thickness was   increased in a pattern of mild LVH. Systolic function was normal.   The estimated ejection fraction was in the range of 55% to 60%.   Wall motion was normal; there were no regional wall motion  abnormalities. Features are consistent with a pseudonormal left   ventricular filling pattern, with concomitant abnormal relaxation   and increased filling pressure (grade 2 diastolic dysfunction). - Mitral valve: Calcified annulus. - Left atrium: The atrium was mildly dilated.  Impressions:  - Normal LV function; mild LVH; grade 2 diastolic dysfunction; mild   LAE; sclerotic aortic valve.  Transthoracic echocardiography.  M-mode, complete 2D, spectral Doppler, and color Doppler.  Birthdate:  Patient birthdate: 06-20-1941.  Age:  Patient is 73 yr old.  Sex:  Gender: male. Height:  Height: 170.2 cm. Height: 67 in.  Weight:  Weight: 129.3 kg. Weight: 284.4 lb.  Body mass index:  BMI: 44.6 kg/m^2.  Body surface area:    BSA: 2.54 m^2.  Blood pressure:     150/80 Patient status:  Outpatient.  Study date:  Study date: 04/11/2014. Study time: 12:55 PM.  Location:  Echo laboratory.  -------------------------------------------------------------------  ------------------------------------------------------------------- Left ventricle:  The cavity size was normal. Wall thickness was increased in a pattern of mild LVH. Systolic function was normal. The estimated ejection fraction was in the range of 55% to 60%. Wall motion was normal; there were no regional wall motion abnormalities. Features are consistent with a pseudonormal left ventricular filling pattern, with concomitant abnormal relaxation and increased filling pressure (grade 2  diastolic dysfunction).  ------------------------------------------------------------------- Aortic valve:   Trileaflet; mildly calcified leaflets. Mobility was not restricted.  Doppler:  Transvalvular velocity was within the normal range. There was no stenosis. There was no regurgitation.   ------------------------------------------------------------------- Aorta:  Aortic root: The aortic root was normal in size.  ------------------------------------------------------------------- Mitral valve:   Calcified annulus. Mobility was not restricted. Doppler:  Transvalvular velocity was within the normal range. There was no evidence for stenosis. There was no regurgitation.    Peak gradient (D): 5 mm Hg.  ------------------------------------------------------------------- Left atrium:  LA Volume/BSA= 27.7 ml/m2. The atrium was mildly dilated.  ------------------------------------------------------------------- Right ventricle:  The cavity size was normal. Systolic function was normal.  ------------------------------------------------------------------- Pulmonic valve:    Doppler:  Transvalvular velocity was within the normal range. There was no evidence for stenosis.  ------------------------------------------------------------------- Tricuspid valve:   Structurally normal valve.    Doppler: Transvalvular velocity was within the normal range. There was trivial regurgitation.  ------------------------------------------------------------------- Right atrium:  The atrium was normal in size.  ------------------------------------------------------------------- Pericardium:  There was no pericardial effusion.  ------------------------------------------------------------------- Systemic veins: Inferior vena cava: The vessel was normal in size. The respirophasic diameter changes were in the normal range (= 50%), consistent with normal central venous pressure. Diameter: 20.6  mm.  ------------------------------------------------------------------- Prepared and Electronically Authenticated by  Kirk Ruths 2015-07-27T13:57:35    Left and Right Heart Catheterization with Coronary Angiography Report   BUNNIE LEDERMAN  73 y.o.  male  [birth year]  Procedure Date: 04/13/2014  Referring Physician: Kirk Ruths, M.D.  Primary Cardiologist: Kirk Ruths, M.D.  INDICATIONS: High-risk myocardial perfusion study, and exertional angina (class III)  PROCEDURE: 1. Left heart catheterization; 2. Left ventriculography; 3. Selective coronary angiography; 4. Right heart catheterization  CONSENT:  The risks, benefits, and details of the procedure were explained in detail to the patient. Risks including death, stroke, heart attack, kidney injury, allergy, limb ischemia, bleeding and radiation injury were discussed. The patient verbalized understanding and wanted to proceed. Informed written consent was obtained.  PROCEDURE TECHNIQUE: After Xylocaine anesthesia a 5 French Slender sheath was placed in the right radial artery with an angiocath and the modified Seldinger technique. Right heart catheterization was performed via the  right antecubital vein. The previously placed 18-gauge Angiocath was exchanged over a guidewire for a brachial 5 French sheath using double glove technique. We then use a 5 [place] balloontipped catheter to perform right heart catheterization. Coronary angiography was then done using a 5 F JR 4, JL 3.5 cm, and 3.5 cm EBU catheter. Left ventriculography was done using the JR 4 catheter and hand injection.  Review of the images demonstrated severe distal left main disease. The case was terminated. The patient was informed he would need to be admitted to the hospital. TCTS( Dr. Roxy Manns) was consulted.  CONTRAST: Total of 105 cc.  COMPLICATIONS: None  HEMODYNAMICS: Aortic pressure 134/79 mmHg; LV pressure 134/29 mmHg; LVEDP 32 mm mercury; RA 11 mmHg; RV 153/14  mmHg; PA 145/16 mmHg; PCWP(mean) 19 mm mercury; Cardiac Output 7.37 L per minute; AV gradient: none  ANGIOGRAPHIC DATA: The left main coronary artery is heavily calcified and contains a distal 85-90% obstruction.  The left anterior descending artery is LAD is large and reaches the left ventricular apex. There is 50% stenosis in the mid vessel is heavily calcified segment prior to the origin of the first septal perforator and first diagonal. The first diagonal contains an 80% ostial stenosis..  The left circumflex artery is heavily calcified proximally. A large branching first obtuse marginal is followed by the continuation of the circumflex before 2 small distal obtuse marginal branches arise. The circumflex just distal to the origin of the first obtuse marginal contains segmental 50% narrowing..  The right coronary artery is dominant. A stent sandwich is present in the midsegment. There is 50% narrowing near the origin of the acute marginal branch which represents an in-stent restenotic segment. No critical obstruction is noted in the right coronary.  LEFT VENTRICULOGRAM: Left ventricular angiogram was done in the 30 RAO projection and revealed overall normal function with an ejection fraction of approximately 50%.  IMPRESSIONS: 1. Heavily calcified and critically obstructed distal left main 85-90% stenosis.  2. Moderate in-stent restenosis in the mid RCA, 50%  3. 30-50% proximal to mid LAD with a 70-80% ostial diagonal to  4. Overall normal to low normal LV systolic function with evidence of diastolic failure (LVEDP 32 mm mercury)  RECOMMENDATION: Admit to hospital  TCTS has been constantly, Dr. Roxy Manns.    Impression:  Severe left main disease with preserved left ventricular function. The patient presents with progressive symptoms of exertional shortness of breath consistent with acute exacerbation of chronic diastolic congestive heart failure. The patient is also having exertional chest discomfort  consistent with angina pectoris. Based upon his coronary anatomy I agree that he would best be treated with surgical revascularization.    Plan:  I have reviewed the indications, risks, and potential benefits of coronary artery bypass grafting with the patient and his wife this evening.  Alternative treatment strategies have been discussed.  The patient understands and accepts all potential associated risks of surgery including but not limited to risk of death, stroke or other neurologic complication, myocardial infarction, congestive heart failure, respiratory failure, renal failure, bleeding requiring blood transfusion and/or reexploration, aortic dissection or other major vascular complication, arrhythmia, heart block or bradycardia requiring permanent pacemaker, pneumonia, pleural effusion, wound infection, pulmonary embolus or other thromboembolic complication, chronic pain or other delayed complications related to median sternotomy, or the late recurrence of symptomatic ischemic heart disease and/or congestive heart failure.  The importance of long term risk modification have been emphasized.  All questions answered.  We tentatively plan to proceed  with surgery on Friday, 04/05/2014.     I spent in excess of 120 minutes during the conduct of this hospital consultation and >50% of this time involved direct face-to-face encounter for counseling and/or coordination of the patient's care.   Valentina Gu. Roxy Manns, MD 04/13/2014 7:30pm

## 2014-04-13 NOTE — Progress Notes (Signed)
Patient received to room 2H25 via stretcher accompanied by two nurses from the cath lab.  Patient introduced to staff and to unit routine.  CHG bath given.  MD in to speak with patient and patient's wife re:  Surgical intervention and risks / benefits of intervention.

## 2014-04-13 NOTE — H&P (View-Only) (Signed)
HPI: FU coronary artery disease. Patient had PCI of his right coronary artery in September of 2009 in the setting of an inferior infarct. He had a 50% circumflex at that time. Carotid Dopplers in July 2015 showed 50-69% stenosis on the right and left 0-49% stenosis. Followup recommended in 12 months. Echocardiogram in July of 2015 showed normal LV function, grade 2 diastolic dysfunction, mild left atrial enlargement. Nuclear study in July of 2015 showed ischemia in the LAD distribution and scar in the right coronary artery distribution. There was a hypotensive blood pressure response and study felt to be high-risk. We added Lasix at time of last office visit. He was complaining of dyspnea. Since I last saw him, He continues to note dyspnea on exertion. There is no orthopnea or PND but there is pedal edema. He denies chest pain or syncope.   Current Outpatient Prescriptions  Medication Sig Dispense Refill  . acetaminophen (TYLENOL) 500 MG tablet Take 1,000 mg by mouth as needed. Pain      . aspirin 81 MG tablet Take 81 mg by mouth daily.      Marland Kitchen atorvastatin (LIPITOR) 80 MG tablet Take 80 mg by mouth every evening.      Marland Kitchen CALCIUM-MAGNESIUM-ZINC PO Take 1 each by mouth daily.      . Cholecalciferol (VITAMIN D) 2000 UNITS tablet Take 2,000 Units by mouth daily.      Marland Kitchen co-enzyme Q-10 30 MG capsule Take 30 mg by mouth 3 (three) times daily.      . fluticasone (FLONASE) 50 MCG/ACT nasal spray Place 2 sprays into the nose daily.  48 g  1  . furosemide (LASIX) 20 MG tablet Take 1 tablet (20 mg total) by mouth daily.  90 tablet  3  . loratadine (CLARITIN) 10 MG tablet Take 10 mg by mouth daily.       Marland Kitchen losartan (COZAAR) 100 MG tablet TAKE 1 TABLET DAILY  90 tablet  0  . nitroGLYCERIN (NITROSTAT) 0.4 MG SL tablet Place 1 tablet (0.4 mg total) under the tongue every 5 (five) minutes as needed.  25 tablet  12  . omeprazole (PRILOSEC) 20 MG capsule Take 20 mg by mouth every morning.      . STUDY  MEDICATION Take 1 tablet by mouth every evening. [facility] for cholesterol. Study medication Anacetrapib or placebo      . testosterone cypionate (DEPOTESTOTERONE CYPIONATE) 200 MG/ML injection       . vitamin E 100 UNIT capsule Take 100 Units by mouth daily.       No current facility-administered medications for this visit.     Past Medical History  Diagnosis Date  . CAD (coronary artery disease)   . HTN (hypertension)   . Prostatitis   . Hyperlipidemia   . Obesity   . Diverticular disease   . Gallstones   . Hepatitis age 73  . Arthritis   . Sleep apnea     mild, CPAP wasn't indicated (per pt)--2014 mod-severe, CPAP rec  . Benign prostatic hypertrophy     followed by Dr. Gaynelle Arabian  . Cerebrovascular disease   . Sepsis due to Escherichia coli ? 2011    DUE TO URINARY OUTLET OBSTRUCTION /INFECTION  . UTI (urinary tract infection)     secondary  . Allergy     RHINITIS  . 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 Stanford Breed); s/p stents  . GERD (gastroesophageal reflux disease)   . Hypogonadism male  treated by Dr. Gaynelle Arabian  . Erectile dysfunction   . Peripheral vascular disease     02/28/12 MOST RECENT CAROTID DUPLEX--"STABLE MILD CALCIFIED PLAQUE, BILATERALY, STABLE, OVER SERIAL EXAMS"  . 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 IFG (impaired fasting glucose)   . Pneumonia     hx of    Past Surgical History  Procedure Laterality Date  . Total knee arthroplasty  L 4/06, R 4/07  . Vasectomy    . Tonsillectomy    . Transurethral resection of prostate  06/2010  . Knee surgery  05/2010    removing scar tissue R knee (Dr. Wynelle Link)  . Coronary angioplasty with stent placement  01/1997    PTCA/stent to RCA  AND 2009 2ND STENT PLACED  . Joint replacement      both knees  . Total knee revision  09/23/2012    Procedure: TOTAL KNEE REVISION;  Surgeon: Gearlean Alf, MD;  Location: [facility];  Service: Orthopedics;  Laterality: Right;    History   Social  History  . Marital Status: Married    Spouse Name: N/A    Number of Children: 4  . Years of Education: N/A   Occupational History  . CPA    Social History Main Topics  . Smoking status: Former Smoker -- 1.50 packs/day for 30 years    Types: Cigarettes    Quit date: 09/16/1986  . Smokeless tobacco: Never Used     Comment: 35 pack year history, none in 18 years  . Alcohol Use: Yes     Comment: 1 glass of wine twice a week (previously daily)  . Drug Use: No  . Sexual Activity: Not Currently   Other Topics Concern  . Not on file   Social History Narrative   Lives with wife.  4 kids, 1 stepchild (1 in [place], 4 in [place]). No pets. Wife is a diabetic    ROS: no fevers or chills, productive cough, hemoptysis, dysphasia, odynophagia, melena, hematochezia, dysuria, hematuria, rash, seizure activity, orthopnea, PND, pedal edema, claudication. Remaining systems are negative.  Physical Exam: Well-developed obese in no acute distress.  Skin is warm and dry.  HEENT is normal.  Neck is supple.  Chest is clear to auscultation with normal expansion.  Cardiovascular exam is regular rate and rhythm.  Abdominal exam nontender or distended. No masses palpated. Extremities show 1+ edema. neuro grossly intact

## 2014-04-14 ENCOUNTER — Ambulatory Visit (HOSPITAL_COMMUNITY): Payer: 59

## 2014-04-14 ENCOUNTER — Encounter (HOSPITAL_COMMUNITY): Payer: Medicare Other

## 2014-04-14 ENCOUNTER — Other Ambulatory Visit: Payer: Self-pay | Admitting: *Deleted

## 2014-04-14 ENCOUNTER — Encounter (HOSPITAL_COMMUNITY): Payer: Self-pay | Admitting: *Deleted

## 2014-04-14 DIAGNOSIS — I2510 Atherosclerotic heart disease of native coronary artery without angina pectoris: Principal | ICD-10-CM

## 2014-04-14 DIAGNOSIS — Z01810 Encounter for preprocedural cardiovascular examination: Secondary | ICD-10-CM

## 2014-04-14 DIAGNOSIS — I5033 Acute on chronic diastolic (congestive) heart failure: Secondary | ICD-10-CM

## 2014-04-14 DIAGNOSIS — R0602 Shortness of breath: Secondary | ICD-10-CM

## 2014-04-14 LAB — LIPID PANEL
CHOLESTEROL: 85 mg/dL (ref 0–200)
HDL: 33 mg/dL — ABNORMAL LOW (ref >39)
LDL Cholesterol: 29 mg/dL (ref 0–99)
Total CHOL/HDL Ratio: 2.6 RATIO
Triglycerides: 114 mg/dL (ref <150)
VLDL: 23 mg/dL (ref 0–40)

## 2014-04-14 LAB — HEMOGLOBIN A1C
HEMOGLOBIN A1C: 6.5 % — AB (ref <5.7)
Mean Plasma Glucose: 140 mg/dL — ABNORMAL HIGH (ref <117)

## 2014-04-14 LAB — TSH: TSH: 2.36 u[IU]/mL (ref 0.350–4.500)

## 2014-04-14 LAB — PULMONARY FUNCTION TEST
FEF 25-75 PRE: 1.32 L/s
FEF 25-75 Post: 1.77 L/sec
FEF2575-%Change-Post: 34 %
FEF2575-%Pred-Post: 85 %
FEF2575-%Pred-Pre: 63 %
FEV1-%CHANGE-POST: 7 %
FEV1-%PRED-PRE: 59 %
FEV1-%Pred-Post: 63 %
FEV1-Post: 1.77 L
FEV1-Pre: 1.65 L
FEV1FVC-%Change-Post: 3 %
FEV1FVC-%Pred-Pre: 104 %
FEV6-%Change-Post: 4 %
FEV6-%PRED-POST: 62 %
FEV6-%PRED-PRE: 59 %
FEV6-POST: 2.24 L
FEV6-PRE: 2.15 L
FEV6FVC-%CHANGE-POST: 0 %
FEV6FVC-%PRED-POST: 106 %
FEV6FVC-%PRED-PRE: 106 %
FVC-%Change-Post: 3 %
FVC-%PRED-POST: 58 %
FVC-%PRED-PRE: 56 %
FVC-Post: 2.24 L
FVC-Pre: 2.16 L
POST FEV1/FVC RATIO: 79 %
POST FEV6/FVC RATIO: 100 %
Pre FEV1/FVC ratio: 76 %
Pre FEV6/FVC Ratio: 100 %

## 2014-04-14 LAB — COMPREHENSIVE METABOLIC PANEL
ALT: 27 U/L (ref 0–53)
ANION GAP: 16 — AB (ref 5–15)
AST: 25 U/L (ref 0–37)
Albumin: 3.2 g/dL — ABNORMAL LOW (ref 3.5–5.2)
Alkaline Phosphatase: 37 U/L — ABNORMAL LOW (ref 39–117)
BUN: 9 mg/dL (ref 6–23)
CO2: 22 mEq/L (ref 19–32)
CREATININE: 0.98 mg/dL (ref 0.50–1.35)
Calcium: 8.3 mg/dL — ABNORMAL LOW (ref 8.4–10.5)
Chloride: 103 mEq/L (ref 96–112)
GFR calc Af Amer: >90 mL/min (ref >90)
GFR calc non Af Amer: 80 mL/min — ABNORMAL LOW (ref >90)
GLUCOSE: 81 mg/dL (ref 70–99)
POTASSIUM: 3.9 meq/L (ref 3.7–5.3)
Sodium: 141 mEq/L (ref 137–147)
TOTAL PROTEIN: 6 g/dL (ref 6.0–8.3)
Total Bilirubin: 0.8 mg/dL (ref 0.3–1.2)

## 2014-04-14 LAB — BLOOD GAS, ARTERIAL
ACID-BASE EXCESS: 1.8 mmol/L (ref 0.0–2.0)
BICARBONATE: 26 meq/L — AB (ref 20.0–24.0)
FIO2: 0.21 %
O2 SAT: 94.8 %
PO2 ART: 75.7 mmHg — AB (ref 80.0–100.0)
Patient temperature: 98.6
TCO2: 27.3 mmol/L (ref 0–100)
pCO2 arterial: 42.4 mmHg (ref 35.0–45.0)
pH, Arterial: 7.405 (ref 7.350–7.450)

## 2014-04-14 LAB — GLUCOSE, CAPILLARY: Glucose-Capillary: 115 mg/dL — ABNORMAL HIGH (ref 70–99)

## 2014-04-14 LAB — CBC
HCT: 40.8 % (ref 39.0–52.0)
HEMOGLOBIN: 12.4 g/dL — AB (ref 13.0–17.0)
MCH: 26.4 pg (ref 26.0–34.0)
MCHC: 30.4 g/dL (ref 30.0–36.0)
MCV: 87 fL (ref 78.0–100.0)
Platelets: 250 10*3/uL (ref 150–400)
RBC: 4.69 MIL/uL (ref 4.22–5.81)
RDW: 15.5 % (ref 11.5–15.5)
WBC: 8.8 10*3/uL (ref 4.0–10.5)

## 2014-04-14 LAB — URINALYSIS, ROUTINE W REFLEX MICROSCOPIC
BILIRUBIN URINE: NEGATIVE
Glucose, UA: NEGATIVE mg/dL
Hgb urine dipstick: NEGATIVE
Ketones, ur: NEGATIVE mg/dL
Leukocytes, UA: NEGATIVE
Nitrite: NEGATIVE
PROTEIN: NEGATIVE mg/dL
Specific Gravity, Urine: 1.02 (ref 1.005–1.030)
UROBILINOGEN UA: 1 mg/dL (ref 0.0–1.0)
pH: 7 (ref 5.0–8.0)

## 2014-04-14 LAB — PROTIME-INR
INR: 1.08 (ref 0.00–1.49)
PROTHROMBIN TIME: 14 s (ref 11.6–15.2)

## 2014-04-14 LAB — TYPE AND SCREEN
ABO/RH(D): A POS
ANTIBODY SCREEN: NEGATIVE

## 2014-04-14 LAB — APTT: aPTT: 27 seconds (ref 24–37)

## 2014-04-14 LAB — ABO/RH: ABO/RH(D): A POS

## 2014-04-14 MED ORDER — CARVEDILOL 6.25 MG PO TABS
6.2500 mg | ORAL_TABLET | Freq: Two times a day (BID) | ORAL | Status: DC
  Administered 2014-04-14: 6.25 mg via ORAL
  Filled 2014-04-14 (×4): qty 1

## 2014-04-14 MED ORDER — SODIUM CHLORIDE 0.9 % IV SOLN
INTRAVENOUS | Status: DC
  Administered 2014-04-15: 12:00:00 via INTRAVENOUS

## 2014-04-14 MED ORDER — BISACODYL 5 MG PO TBEC: 5.0000 mg | DELAYED_RELEASE_TABLET | Freq: Once | ORAL | Status: DC

## 2014-04-14 MED ORDER — FUROSEMIDE 10 MG/ML IJ SOLN
40.0000 mg | Freq: Two times a day (BID) | INTRAMUSCULAR | Status: DC
  Administered 2014-04-14 (×2): 40 mg via INTRAVENOUS
  Filled 2014-04-14 (×4): qty 4

## 2014-04-14 MED ORDER — DOPAMINE-DEXTROSE 3.2-5 MG/ML-% IV SOLN
2.0000 ug/kg/min | INTRAVENOUS | Status: DC
  Filled 2014-04-14: qty 250

## 2014-04-14 MED ORDER — MAGNESIUM SULFATE 50 % IJ SOLN
40.0000 meq | INTRAMUSCULAR | Status: DC
  Filled 2014-04-14: qty 10

## 2014-04-14 MED ORDER — SODIUM CHLORIDE 0.9 % IV SOLN
INTRAVENOUS | Status: AC
  Administered 2014-04-15: 1.5 [IU]/h via INTRAVENOUS
  Filled 2014-04-14: qty 1

## 2014-04-14 MED ORDER — ENOXAPARIN SODIUM 150 MG/ML ~~LOC~~ SOLN
1.0000 mg/kg | Freq: Once | SUBCUTANEOUS | Status: AC
  Administered 2014-04-14: 130 mg via SUBCUTANEOUS
  Filled 2014-04-14: qty 1

## 2014-04-14 MED ORDER — CHLORHEXIDINE GLUCONATE 4 % EX LIQD
60.0000 mL | Freq: Once | CUTANEOUS | Status: AC
  Administered 2014-04-15: 4 via TOPICAL
  Filled 2014-04-14: qty 60

## 2014-04-14 MED ORDER — METOPROLOL TARTRATE 12.5 MG HALF TABLET
12.5000 mg | ORAL_TABLET | Freq: Once | ORAL | Status: AC
  Administered 2014-04-15: 12.5 mg via ORAL
  Filled 2014-04-14: qty 1

## 2014-04-14 MED ORDER — VANCOMYCIN HCL 1000 MG IV SOLR
INTRAVENOUS | Status: AC
  Administered 2014-04-15: 09:00:00
  Filled 2014-04-14: qty 1000

## 2014-04-14 MED ORDER — DEXTROSE 5 % IV SOLN
1.5000 g | INTRAVENOUS | Status: DC
  Filled 2014-04-14: qty 1.5

## 2014-04-14 MED ORDER — PLASMA-LYTE 148 IV SOLN
INTRAVENOUS | Status: AC
  Administered 2014-04-15: 09:00:00
  Filled 2014-04-14: qty 2.5

## 2014-04-14 MED ORDER — CHLORHEXIDINE GLUCONATE 4 % EX LIQD
60.0000 mL | Freq: Once | CUTANEOUS | Status: AC
  Administered 2014-04-14: 4 via TOPICAL
  Filled 2014-04-14: qty 60

## 2014-04-14 MED ORDER — TEMAZEPAM 15 MG PO CAPS: 15.0000 mg | ORAL_CAPSULE | Freq: Once | ORAL | Status: AC | PRN

## 2014-04-14 MED ORDER — POTASSIUM CHLORIDE 2 MEQ/ML IV SOLN
80.0000 meq | INTRAVENOUS | Status: DC
  Filled 2014-04-14: qty 40

## 2014-04-14 MED ORDER — SODIUM CHLORIDE 0.9 % IV SOLN
1500.0000 mg | INTRAVENOUS | Status: AC
  Administered 2014-04-15: 1500 mg via INTRAVENOUS
  Filled 2014-04-14: qty 1500

## 2014-04-14 MED ORDER — NITROGLYCERIN IN D5W 200-5 MCG/ML-% IV SOLN
2.0000 ug/min | INTRAVENOUS | Status: DC
  Administered 2014-04-14: 2 ug/min via INTRAVENOUS
  Filled 2014-04-14: qty 250

## 2014-04-14 MED ORDER — EPINEPHRINE HCL 1 MG/ML IJ SOLN
0.5000 ug/min | INTRAVENOUS | Status: DC
  Filled 2014-04-14: qty 4

## 2014-04-14 MED ORDER — POTASSIUM CHLORIDE CRYS ER 20 MEQ PO TBCR
40.0000 meq | EXTENDED_RELEASE_TABLET | Freq: Once | ORAL | Status: AC
  Administered 2014-04-14: 40 meq via ORAL
  Filled 2014-04-14: qty 2

## 2014-04-14 MED ORDER — SODIUM CHLORIDE 0.9 % IV SOLN
INTRAVENOUS | Status: AC
  Administered 2014-04-15: 69.8 mL/h via INTRAVENOUS
  Filled 2014-04-14: qty 40

## 2014-04-14 MED ORDER — ALBUTEROL SULFATE (2.5 MG/3ML) 0.083% IN NEBU
2.5000 mg | INHALATION_SOLUTION | Freq: Once | RESPIRATORY_TRACT | Status: AC
  Administered 2014-04-14: 2.5 mg via RESPIRATORY_TRACT

## 2014-04-14 MED ORDER — DEXMEDETOMIDINE HCL IN NACL 400 MCG/100ML IV SOLN
0.1000 ug/kg/h | INTRAVENOUS | Status: AC
  Administered 2014-04-15: .7 ug/kg/h via INTRAVENOUS
  Filled 2014-04-14: qty 100

## 2014-04-14 MED ORDER — DEXTROSE 5 % IV SOLN
30.0000 ug/min | INTRAVENOUS | Status: AC
  Administered 2014-04-15: 60 ug/min via INTRAVENOUS
  Filled 2014-04-14: qty 2

## 2014-04-14 MED ORDER — NITROGLYCERIN IN D5W 200-5 MCG/ML-% IV SOLN
2.0000 ug/min | INTRAVENOUS | Status: DC
  Filled 2014-04-14: qty 250

## 2014-04-14 MED ORDER — SODIUM CHLORIDE 0.9 % IV SOLN
INTRAVENOUS | Status: DC
  Filled 2014-04-14: qty 30

## 2014-04-14 MED ORDER — DEXTROSE 5 % IV SOLN
750.0000 mg | INTRAVENOUS | Status: DC
  Filled 2014-04-14: qty 750

## 2014-04-14 NOTE — Progress Notes (Signed)
[id number] Discussed with pt importance of mobility and IS after surgery. Demonstrated for pt how to get up and down adhering to sternal precautions. IS in room but pt did not want to use right now as he just finished resp test. He did state he has used IS in past for other surgeries. Gave [facility] booklet and care guide. Wrote down for pt and family how to view preop video. Discussed need to have someone with pt 24/7 first week after discharge. Will follow up after surgery. Graylon Good RN BSN 04/14/2014 1:33 PM

## 2014-04-14 NOTE — Progress Notes (Signed)
      [address]       [place] [address]             [phone]     CARDIOTHORACIC SURGERY PROGRESS NOTE  1 Day Post-Op  S/P Procedure(s): LEFT AND RIGHT HEART CATHETERIZATION WITH CORONARY ANGIOGRAM  Subjective: Feels well.  No chest pain.  Objective: Vital signs in last 24 hours: Temp:  [97.8 F (36.6 C)-99.3 F (37.4 C)] 97.8 F (36.6 C) (07/30 1630) Pulse Rate:  [73-90] 81 (07/30 1704) Cardiac Rhythm:  [-] Normal sinus rhythm (07/30 0800) Resp:  [12-25] 20 (07/30 1700) BP: (114-195)/(59-102) 145/80 mmHg (07/30 1704) SpO2:  [76 %-99 %] 96 % (07/30 1630) Weight:  [128.1 kg (282 lb 6.6 oz)] 128.1 kg (282 lb 6.6 oz) (07/29 1845)  Physical Exam:  Rhythm:   sinus  Breath sounds: clear  Heart sounds:  RRR w/out murmur  Incisions:  n/a  Abdomen:  Soft, non-distended, non-tender  Extremities:  Warm, well-perfused    Intake/Output from previous day: 07/29 0701 - 07/30 0700 In: 1165 [P.O.:390; I.V.:775] Out: 925 [Urine:925] Intake/Output this shift: Total I/O In: 240 [P.O.:240] Out: 1125 [Urine:1125]  Lab Results:  Recent Labs  04/12/14 1051 04/14/14 0303  WBC 7.1 8.8  HGB 13.0 12.4*  HCT 40.1 40.8  PLT 296 250   BMET:  Recent Labs  04/12/14 1051 04/14/14 0303  NA 141 141  K 4.3 3.9  CL 103 103  CO2 31 22  GLUCOSE 95 81  BUN 8 9  CREATININE 0.99 0.98  CALCIUM 9.2 8.3*    CBG (last 3)  No results found for this basename: GLUCAP,  in the last 72 hours PT/INR:   Recent Labs  04/14/14 0303  LABPROT 14.0  INR 1.08    CXR:  PORTABLE CHEST - 1 VIEW  COMPARISON: PA and lateral chest x-ray of May 29, 2012  FINDINGS:  The cardiopericardial silhouette is enlarged. The central pulmonary  vascularity is minimally prominent. There is no pleural effusion or  alveolar infiltrate. The bony thorax is unremarkable.  IMPRESSION:  Mild interstitial prominence and increase in size of the cardiac  silhouette may reflect low-grade  CHF. However, today's portable film  is being compared to a previous PA study. A PA and lateral chest  x-ray would be useful.  Electronically Signed  By: David Martinique  On: 04/14/2014 08:23   Assessment/Plan: S/P Procedure(s): LEFT AND RIGHT HEART CATHETERIZATION WITH CORONARY ANGIOGRAM  I again reviewed the indications, risks and potential benefits of surgery with Mr Maselli and his wife.  Expectations for his post-operative convalescence were discussed.  All questions answered.  For OR in am.  OWEN,CLARENCE H 04/14/2014 5:07 PM

## 2014-04-14 NOTE — Progress Notes (Signed)
Utilization review completed. Sanja Elizardo, RN, BSN. 

## 2014-04-14 NOTE — Progress Notes (Signed)
Pre-op Cardiac Surgery  Carotid Findings:  CV done at [facility] 04/11/2014.  Right 50-69%. Left 0-49%.  Upper Extremity Right Left  Brachial Pressures 117T 120T  Radial Waveforms T T  Ulnar Waveforms T M  Palmar Arch (Allen's Test) WNL Doppler signal remains normal with radial compression and obliterates with ulnar compression   Findings:      Lower  Extremity Right Left  Dorsalis Pedis    Anterior Tibial    Posterior Tibial    Ankle/Brachial Indices      Findings:  Palpable

## 2014-04-14 NOTE — Progress Notes (Signed)
Patient ID: Jeffrey Wiggins, male   DOB: December 01, 1940, 73 y.o.   MRN: [id number]   SUBJECTIVE: No chest.  Has had exertional dyspnea.   Scheduled Meds: . aspirin EC  81 mg Oral Daily  . atorvastatin  80 mg Oral QPM  . bisacodyl  5 mg Oral Once  . carvedilol  6.25 mg Oral BID WC  . chlorhexidine  60 mL Topical Once   And  . [START ON 04/15/2014] chlorhexidine  60 mL Topical Once  . cholecalciferol  2,000 Units Oral Daily  . enoxaparin (LOVENOX) injection  1 mg/kg Subcutaneous Once  . fluticasone  2 spray Each Nare Daily  . furosemide  40 mg Intravenous BID  . loratadine  10 mg Oral Daily  . losartan  100 mg Oral Daily  . [START ON 04/15/2014] metoprolol tartrate  12.5 mg Oral Once  . pantoprazole  40 mg Oral Daily  . potassium chloride  40 mEq Oral Once  . [START ON 04/23/2014] testosterone cypionate  200 mg Intramuscular Q14 Days   Continuous Infusions: . nitroGLYCERIN     PRN Meds:.acetaminophen, nitroGLYCERIN, ondansetron (ZOFRAN) IV, temazepam    Filed Vitals:   04/14/14 0600 04/14/14 0636 04/14/14 0801 04/14/14 1217  BP:  170/93 170/93 170/102  Pulse: 78 81 79 88  Temp:   98.4 F (36.9 C) 98.2 F (36.8 C)  TempSrc:   Oral Oral  Resp: 16 14 16 25   Height:      Weight:      SpO2: 99% 98% 99% 98%    Intake/Output Summary (Last 24 hours) at 04/14/14 1221 Last data filed at 04/14/14 0802  Gross per 24 hour  Intake   1165 ml  Output   1150 ml  Net     15 ml    LABS: Basic Metabolic Panel:  Recent Labs  04/12/14 1051 04/14/14 0303  NA 141 141  K 4.3 3.9  CL 103 103  CO2 31 22  GLUCOSE 95 81  BUN 8 9  CREATININE 0.99 0.98  CALCIUM 9.2 8.3*   Liver Function Tests:  Recent Labs  04/14/14 0303  AST 25  ALT 27  ALKPHOS 37*  BILITOT 0.8  PROT 6.0  ALBUMIN 3.2*   No results found for this basename: LIPASE, AMYLASE,  in the last 72 hours CBC:  Recent Labs  04/12/14 1051 04/14/14 0303  WBC 7.1 8.8  HGB 13.0 12.4*  HCT 40.1 40.8  MCV 82.9 87.0    PLT 296 250   Cardiac Enzymes: No results found for this basename: CKTOTAL, CKMB, CKMBINDEX, TROPONINI,  in the last 72 hours BNP: No components found with this basename: POCBNP,  D-Dimer: No results found for this basename: DDIMER,  in the last 72 hours Hemoglobin A1C: No results found for this basename: HGBA1C,  in the last 72 hours Fasting Lipid Panel:  Recent Labs  04/14/14 0303  CHOL 85  HDL 33*  LDLCALC 29  TRIG 114  CHOLHDL 2.6   Thyroid Function Tests:  Recent Labs  04/14/14 0303  TSH 2.360   Anemia Panel: No results found for this basename: VITAMINB12, FOLATE, FERRITIN, TIBC, IRON, RETICCTPCT,  in the last 72 hours  RADIOLOGY: Dg Chest Port 1 View  04/14/2014   CLINICAL DATA:  Status post cardiac surgery  EXAM: PORTABLE CHEST - 1 VIEW  COMPARISON:  PA and lateral chest x-ray of May 29, 2012  FINDINGS: The cardiopericardial silhouette is enlarged. The central pulmonary vascularity is minimally prominent. There is  no pleural effusion or alveolar infiltrate. The bony thorax is unremarkable.  IMPRESSION: Mild interstitial prominence and increase in size of the cardiac silhouette may reflect low-grade CHF. However, today's portable film is being compared to a previous PA study. A PA and lateral chest x-ray would be useful.   Electronically Signed   By: David  Martinique   On: 04/14/2014 08:23   PHYSICAL EXAM General: NAD Neck: Thick, JVP 10-12 cm, no thyromegaly or thyroid nodule.  Lungs: Clear to auscultation bilaterally with normal respiratory effort. CV: Nondisplaced PMI.  Heart regular S1/S2, no S3/S4, no murmur.  1+ ankle edema.   Abdomen: Soft, nontender, no hepatosplenomegaly, no distention.  Neurologic: Alert and oriented x 3.  Psych: Normal affect. Extremities: No clubbing or cyanosis.   TELEMETRY: Reviewed telemetry pt in NSR  ASSESSMENT AND PLAN: 73 yo with history of CAD and recent exertional dyspnea had cath yesterday showing severe LM stenosis and  elevated LVEDP.  1. CAD: Severe LM stenosis, plan for CABG tomorrow.  Continue ASA, statin.  2. Acute/chronic diastolic CHF: EF 75% on cath.  He is volume overloaded on exam and LVEDP was high at cath yesterday. Short of breath with exertion.  - Lasix 40 mg IV bid, 1st dose now.  3. HTN: BP running high.  Add Coreg 6.25 mg bid and use NTG gtt to keep SBP < 140.   Loralie Champagne 04/14/2014 12:28 PM

## 2014-04-15 ENCOUNTER — Inpatient Hospital Stay (HOSPITAL_COMMUNITY): Payer: 59

## 2014-04-15 ENCOUNTER — Ambulatory Visit (HOSPITAL_COMMUNITY): Payer: 59 | Admitting: Anesthesiology

## 2014-04-15 ENCOUNTER — Encounter (HOSPITAL_COMMUNITY): Payer: Self-pay | Admitting: Thoracic Surgery (Cardiothoracic Vascular Surgery)

## 2014-04-15 ENCOUNTER — Encounter (HOSPITAL_COMMUNITY): Payer: 59 | Admitting: Anesthesiology

## 2014-04-15 ENCOUNTER — Encounter (HOSPITAL_COMMUNITY)
Admission: RE | Disposition: A | Discharge status: Home / Self Care | Payer: 59 | Source: Ambulatory Visit | Attending: Thoracic Surgery (Cardiothoracic Vascular Surgery)

## 2014-04-15 DIAGNOSIS — E7800 Pure hypercholesterolemia, unspecified: Secondary | ICD-10-CM | POA: Diagnosis present

## 2014-04-15 DIAGNOSIS — Z87891 Personal history of nicotine dependence: Secondary | ICD-10-CM | POA: Diagnosis not present

## 2014-04-15 DIAGNOSIS — I10 Essential (primary) hypertension: Secondary | ICD-10-CM | POA: Diagnosis present

## 2014-04-15 DIAGNOSIS — J9819 Other pulmonary collapse: Secondary | ICD-10-CM | POA: Diagnosis not present

## 2014-04-15 DIAGNOSIS — N400 Enlarged prostate without lower urinary tract symptoms: Secondary | ICD-10-CM | POA: Diagnosis present

## 2014-04-15 DIAGNOSIS — Z823 Family history of stroke: Secondary | ICD-10-CM | POA: Diagnosis not present

## 2014-04-15 DIAGNOSIS — Y831 Surgical operation with implant of artificial internal device as the cause of abnormal reaction of the patient, or of later complication, without mention of misadventure at the time of the procedure: Secondary | ICD-10-CM | POA: Diagnosis present

## 2014-04-15 DIAGNOSIS — K5730 Diverticulosis of large intestine without perforation or abscess without bleeding: Secondary | ICD-10-CM | POA: Diagnosis present

## 2014-04-15 DIAGNOSIS — I509 Heart failure, unspecified: Secondary | ICD-10-CM | POA: Diagnosis present

## 2014-04-15 DIAGNOSIS — Z951 Presence of aortocoronary bypass graft: Secondary | ICD-10-CM

## 2014-04-15 DIAGNOSIS — Z96659 Presence of unspecified artificial knee joint: Secondary | ICD-10-CM | POA: Diagnosis not present

## 2014-04-15 DIAGNOSIS — E119 Type 2 diabetes mellitus without complications: Secondary | ICD-10-CM | POA: Diagnosis present

## 2014-04-15 DIAGNOSIS — Z6841 Body Mass Index (BMI) 40.0 and over, adult: Secondary | ICD-10-CM | POA: Diagnosis not present

## 2014-04-15 DIAGNOSIS — Z8249 Family history of ischemic heart disease and other diseases of the circulatory system: Secondary | ICD-10-CM | POA: Diagnosis not present

## 2014-04-15 DIAGNOSIS — Z7982 Long term (current) use of aspirin: Secondary | ICD-10-CM | POA: Diagnosis not present

## 2014-04-15 DIAGNOSIS — Z9861 Coronary angioplasty status: Secondary | ICD-10-CM | POA: Diagnosis not present

## 2014-04-15 DIAGNOSIS — I2510 Atherosclerotic heart disease of native coronary artery without angina pectoris: Secondary | ICD-10-CM | POA: Diagnosis present

## 2014-04-15 DIAGNOSIS — E785 Hyperlipidemia, unspecified: Secondary | ICD-10-CM | POA: Diagnosis present

## 2014-04-15 DIAGNOSIS — H9190 Unspecified hearing loss, unspecified ear: Secondary | ICD-10-CM | POA: Diagnosis present

## 2014-04-15 DIAGNOSIS — G4733 Obstructive sleep apnea (adult) (pediatric): Secondary | ICD-10-CM | POA: Diagnosis present

## 2014-04-15 DIAGNOSIS — E291 Testicular hypofunction: Secondary | ICD-10-CM | POA: Diagnosis present

## 2014-04-15 DIAGNOSIS — T82897A Other specified complication of cardiac prosthetic devices, implants and grafts, initial encounter: Secondary | ICD-10-CM | POA: Diagnosis present

## 2014-04-15 DIAGNOSIS — Z9852 Vasectomy status: Secondary | ICD-10-CM | POA: Diagnosis not present

## 2014-04-15 DIAGNOSIS — Z79899 Other long term (current) drug therapy: Secondary | ICD-10-CM | POA: Diagnosis not present

## 2014-04-15 DIAGNOSIS — D62 Acute posthemorrhagic anemia: Secondary | ICD-10-CM | POA: Diagnosis not present

## 2014-04-15 DIAGNOSIS — I252 Old myocardial infarction: Secondary | ICD-10-CM | POA: Diagnosis not present

## 2014-04-15 DIAGNOSIS — K219 Gastro-esophageal reflux disease without esophagitis: Secondary | ICD-10-CM | POA: Diagnosis present

## 2014-04-15 DIAGNOSIS — I5033 Acute on chronic diastolic (congestive) heart failure: Secondary | ICD-10-CM | POA: Diagnosis not present

## 2014-04-15 DIAGNOSIS — I739 Peripheral vascular disease, unspecified: Secondary | ICD-10-CM | POA: Diagnosis present

## 2014-04-15 HISTORY — PX: INTRAOPERATIVE TRANSESOPHAGEAL ECHOCARDIOGRAM: SHX5062

## 2014-04-15 HISTORY — DX: Presence of aortocoronary bypass graft: Z95.1

## 2014-04-15 HISTORY — PX: CORONARY ARTERY BYPASS GRAFT: SHX141

## 2014-04-15 LAB — CBC
HCT: 36.4 % — ABNORMAL LOW (ref 39.0–52.0)
HEMATOCRIT: 40.6 % (ref 39.0–52.0)
HEMATOCRIT: 35.4 % — AB (ref 39.0–52.0)
HEMOGLOBIN: 12.6 g/dL — AB (ref 13.0–17.0)
Hemoglobin: 11.1 g/dL — ABNORMAL LOW (ref 13.0–17.0)
Hemoglobin: 11.1 g/dL — ABNORMAL LOW (ref 13.0–17.0)
MCH: 27.2 pg (ref 26.0–34.0)
MCH: 26.7 pg (ref 26.0–34.0)
MCH: 27.3 pg (ref 26.0–34.0)
MCHC: 31 g/dL (ref 30.0–36.0)
MCHC: 30.5 g/dL (ref 30.0–36.0)
MCHC: 31.4 g/dL (ref 30.0–36.0)
MCV: 87.7 fL (ref 78.0–100.0)
MCV: 87.7 fL (ref 78.0–100.0)
MCV: 87.2 fL (ref 78.0–100.0)
PLATELETS: 193 10*3/uL (ref 150–400)
PLATELETS: 171 10*3/uL (ref 150–400)
Platelets: 242 10*3/uL (ref 150–400)
RBC: 4.63 MIL/uL (ref 4.22–5.81)
RBC: 4.15 MIL/uL — AB (ref 4.22–5.81)
RBC: 4.06 MIL/uL — ABNORMAL LOW (ref 4.22–5.81)
RDW: 15.4 % (ref 11.5–15.5)
RDW: 15.3 % (ref 11.5–15.5)
RDW: 15 % (ref 11.5–15.5)
WBC: 9.5 10*3/uL (ref 4.0–10.5)
WBC: 16.7 10*3/uL — ABNORMAL HIGH (ref 4.0–10.5)
WBC: 14.2 10*3/uL — AB (ref 4.0–10.5)

## 2014-04-15 LAB — POCT I-STAT 3, ART BLOOD GAS (G3+)
ACID-BASE EXCESS: 3 mmol/L — AB (ref 0.0–2.0)
ACID-BASE EXCESS: 1 mmol/L (ref 0.0–2.0)
Acid-base deficit: 1 mmol/L (ref 0.0–2.0)
Acid-base deficit: 1 mmol/L (ref 0.0–2.0)
BICARBONATE: 27.9 meq/L — AB (ref 20.0–24.0)
BICARBONATE: 26.2 meq/L — AB (ref 20.0–24.0)
BICARBONATE: 25 meq/L — AB (ref 20.0–24.0)
Bicarbonate: 27 mEq/L — ABNORMAL HIGH (ref 20.0–24.0)
Bicarbonate: 25.3 mEq/L — ABNORMAL HIGH (ref 20.0–24.0)
O2 SAT: 100 %
O2 Saturation: 99 %
O2 Saturation: 93 %
O2 Saturation: 93 %
O2 Saturation: 97 %
PCO2 ART: 49.8 mmHg — AB (ref 35.0–45.0)
PH ART: 7.39 (ref 7.350–7.450)
PH ART: 7.325 — AB (ref 7.350–7.450)
PO2 ART: 69 mmHg — AB (ref 80.0–100.0)
Patient temperature: 36.3
TCO2: 29 mmol/L (ref 0–100)
TCO2: 27 mmol/L (ref 0–100)
TCO2: 29 mmol/L (ref 0–100)
TCO2: 27 mmol/L (ref 0–100)
TCO2: 26 mmol/L (ref 0–100)
pCO2 arterial: 41.8 mmHg (ref 35.0–45.0)
pCO2 arterial: 43.2 mmHg (ref 35.0–45.0)
pCO2 arterial: 44.9 mmHg (ref 35.0–45.0)
pCO2 arterial: 47.8 mmHg — ABNORMAL HIGH (ref 35.0–45.0)
pH, Arterial: 7.433 (ref 7.350–7.450)
pH, Arterial: 7.338 — ABNORMAL LOW (ref 7.350–7.450)
pH, Arterial: 7.356 (ref 7.350–7.450)
pO2, Arterial: 362 mmHg — ABNORMAL HIGH (ref 80.0–100.0)
pO2, Arterial: 152 mmHg — ABNORMAL HIGH (ref 80.0–100.0)
pO2, Arterial: 69 mmHg — ABNORMAL LOW (ref 80.0–100.0)
pO2, Arterial: 96 mmHg (ref 80.0–100.0)

## 2014-04-15 LAB — POCT I-STAT, CHEM 8
BUN: 7 mg/dL (ref 6–23)
BUN: 6 mg/dL (ref 6–23)
BUN: 7 mg/dL (ref 6–23)
BUN: 7 mg/dL (ref 6–23)
BUN: 7 mg/dL (ref 6–23)
CALCIUM ION: 1.19 mmol/L (ref 1.13–1.30)
CALCIUM ION: 1.04 mmol/L — AB (ref 1.13–1.30)
CALCIUM ION: 1.15 mmol/L (ref 1.13–1.30)
CALCIUM ION: 1.07 mmol/L — AB (ref 1.13–1.30)
CALCIUM ION: 1.22 mmol/L (ref 1.13–1.30)
CHLORIDE: 100 meq/L (ref 96–112)
CHLORIDE: 104 meq/L (ref 96–112)
CREATININE: 1 mg/dL (ref 0.50–1.35)
CREATININE: 1 mg/dL (ref 0.50–1.35)
CREATININE: 1 mg/dL (ref 0.50–1.35)
Chloride: 100 mEq/L (ref 96–112)
Chloride: 100 mEq/L (ref 96–112)
Chloride: 100 mEq/L (ref 96–112)
Creatinine, Ser: 1 mg/dL (ref 0.50–1.35)
Creatinine, Ser: 1 mg/dL (ref 0.50–1.35)
GLUCOSE: 110 mg/dL — AB (ref 70–99)
GLUCOSE: 117 mg/dL — AB (ref 70–99)
GLUCOSE: 118 mg/dL — AB (ref 70–99)
Glucose, Bld: 128 mg/dL — ABNORMAL HIGH (ref 70–99)
Glucose, Bld: 156 mg/dL — ABNORMAL HIGH (ref 70–99)
HCT: 41 % (ref 39.0–52.0)
HCT: 31 % — ABNORMAL LOW (ref 39.0–52.0)
HCT: 39 % (ref 39.0–52.0)
HCT: 34 % — ABNORMAL LOW (ref 39.0–52.0)
HEMATOCRIT: 33 % — AB (ref 39.0–52.0)
HEMOGLOBIN: 10.5 g/dL — AB (ref 13.0–17.0)
HEMOGLOBIN: 11.2 g/dL — AB (ref 13.0–17.0)
Hemoglobin: 13.9 g/dL (ref 13.0–17.0)
Hemoglobin: 13.3 g/dL (ref 13.0–17.0)
Hemoglobin: 11.6 g/dL — ABNORMAL LOW (ref 13.0–17.0)
POTASSIUM: 4.4 meq/L (ref 3.7–5.3)
Potassium: 4.5 mEq/L (ref 3.7–5.3)
Potassium: 4 mEq/L (ref 3.7–5.3)
Potassium: 5.1 mEq/L (ref 3.7–5.3)
Potassium: 4.4 mEq/L (ref 3.7–5.3)
Sodium: 139 mEq/L (ref 137–147)
Sodium: 136 mEq/L — ABNORMAL LOW (ref 137–147)
Sodium: 139 mEq/L (ref 137–147)
Sodium: 138 mEq/L (ref 137–147)
Sodium: 139 mEq/L (ref 137–147)
TCO2: 28 mmol/L (ref 0–100)
TCO2: 26 mmol/L (ref 0–100)
TCO2: 27 mmol/L (ref 0–100)
TCO2: 24 mmol/L (ref 0–100)
TCO2: 23 mmol/L (ref 0–100)

## 2014-04-15 LAB — BASIC METABOLIC PANEL
ANION GAP: 12 (ref 5–15)
BUN: 10 mg/dL (ref 6–23)
CHLORIDE: 101 meq/L (ref 96–112)
CO2: 29 meq/L (ref 19–32)
Calcium: 8.6 mg/dL (ref 8.4–10.5)
Creatinine, Ser: 1.14 mg/dL (ref 0.50–1.35)
GFR calc Af Amer: 72 mL/min — ABNORMAL LOW (ref >90)
GFR calc non Af Amer: 62 mL/min — ABNORMAL LOW (ref >90)
Glucose, Bld: 109 mg/dL — ABNORMAL HIGH (ref 70–99)
Potassium: 3.9 mEq/L (ref 3.7–5.3)
Sodium: 142 mEq/L (ref 137–147)

## 2014-04-15 LAB — POCT I-STAT 4, (NA,K, GLUC, HGB,HCT)
Glucose, Bld: 123 mg/dL — ABNORMAL HIGH (ref 70–99)
HEMATOCRIT: 36 % — AB (ref 39.0–52.0)
HEMOGLOBIN: 12.2 g/dL — AB (ref 13.0–17.0)
POTASSIUM: 3.7 meq/L (ref 3.7–5.3)
SODIUM: 141 meq/L (ref 137–147)

## 2014-04-15 LAB — CREATININE, SERUM
Creatinine, Ser: 0.88 mg/dL (ref 0.50–1.35)
GFR calc Af Amer: >90 mL/min (ref >90)
GFR, EST NON AFRICAN AMERICAN: 84 mL/min — AB (ref >90)

## 2014-04-15 LAB — SURGICAL PCR SCREEN
MRSA, PCR: NEGATIVE
STAPHYLOCOCCUS AUREUS: NEGATIVE

## 2014-04-15 LAB — PLATELET COUNT: Platelets: 248 10*3/uL (ref 150–400)

## 2014-04-15 LAB — HEMOGLOBIN AND HEMATOCRIT, BLOOD
HEMATOCRIT: 32.2 % — AB (ref 39.0–52.0)
Hemoglobin: 10.2 g/dL — ABNORMAL LOW (ref 13.0–17.0)

## 2014-04-15 LAB — PROTIME-INR
INR: 1.22 (ref 0.00–1.49)
Prothrombin Time: 15.4 seconds — ABNORMAL HIGH (ref 11.6–15.2)

## 2014-04-15 LAB — MAGNESIUM: Magnesium: 2.4 mg/dL (ref 1.5–2.5)

## 2014-04-15 LAB — APTT: aPTT: 33 seconds (ref 24–37)

## 2014-04-15 SURGERY — CORONARY ARTERY BYPASS GRAFTING (CABG)
Anesthesia: General | Site: Chest

## 2014-04-15 MED ORDER — MORPHINE SULFATE 2 MG/ML IJ SOLN
1.0000 mg | INTRAMUSCULAR | Status: AC | PRN
  Administered 2014-04-15: 4 mg via INTRAVENOUS
  Filled 2014-04-15: qty 2

## 2014-04-15 MED ORDER — ACETAMINOPHEN 650 MG RE SUPP
650.0000 mg | Freq: Once | RECTAL | Status: AC
  Administered 2014-04-15: 650 mg via RECTAL

## 2014-04-15 MED ORDER — PROTAMINE SULFATE 10 MG/ML IV SOLN
INTRAVENOUS | Status: DC | PRN
  Administered 2014-04-15: 350 mg via INTRAVENOUS

## 2014-04-15 MED ORDER — FENTANYL CITRATE 0.05 MG/ML IJ SOLN
INTRAMUSCULAR | Status: AC
  Filled 2014-04-15: qty 5

## 2014-04-15 MED ORDER — SODIUM CHLORIDE 0.45 % IV SOLN
INTRAVENOUS | Status: DC
Start: 2014-04-15 — End: 2014-04-17
  Administered 2014-04-15: 13:00:00 via INTRAVENOUS

## 2014-04-15 MED ORDER — PROPOFOL 10 MG/ML IV BOLUS
INTRAVENOUS | Status: AC
  Filled 2014-04-15: qty 20

## 2014-04-15 MED ORDER — LACTATED RINGERS IV SOLN
INTRAVENOUS | Status: DC | PRN
  Administered 2014-04-15: 07:00:00 via INTRAVENOUS

## 2014-04-15 MED ORDER — ALBUMIN HUMAN 5 % IV SOLN
INTRAVENOUS | Status: DC | PRN
  Administered 2014-04-15: 11:00:00 via INTRAVENOUS

## 2014-04-15 MED ORDER — DEXTROSE 5 % IV SOLN
0.0000 ug/min | INTRAVENOUS | Status: DC
  Filled 2014-04-15: qty 2

## 2014-04-15 MED ORDER — METOPROLOL TARTRATE 12.5 MG HALF TABLET
12.5000 mg | ORAL_TABLET | Freq: Two times a day (BID) | ORAL | Status: DC
  Filled 2014-04-15 (×3): qty 1

## 2014-04-15 MED ORDER — INSULIN REGULAR BOLUS VIA INFUSION
0.0000 [IU] | Freq: Three times a day (TID) | INTRAVENOUS | Status: DC
  Administered 2014-04-16: 3 [IU] via INTRAVENOUS
  Filled 2014-04-15: qty 10

## 2014-04-15 MED ORDER — FENTANYL CITRATE 0.05 MG/ML IJ SOLN
INTRAMUSCULAR | Status: DC | PRN
  Administered 2014-04-15: 200 ug via INTRAVENOUS
  Administered 2014-04-15: 350 ug via INTRAVENOUS
  Administered 2014-04-15 (×2): 100 ug via INTRAVENOUS
  Administered 2014-04-15: 250 ug via INTRAVENOUS
  Administered 2014-04-15: 100 ug via INTRAVENOUS
  Administered 2014-04-15: 150 ug via INTRAVENOUS

## 2014-04-15 MED ORDER — ACETAMINOPHEN 160 MG/5ML PO SOLN
1000.0000 mg | Freq: Four times a day (QID) | ORAL | Status: DC
  Filled 2014-04-15: qty 40

## 2014-04-15 MED ORDER — ASPIRIN 81 MG PO CHEW: 324.0000 mg | CHEWABLE_TABLET | Freq: Every day | ORAL | Status: DC

## 2014-04-15 MED ORDER — ALBUMIN HUMAN 5 % IV SOLN
250.0000 mL | INTRAVENOUS | Status: AC | PRN
  Administered 2014-04-15: 250 mL via INTRAVENOUS

## 2014-04-15 MED ORDER — SODIUM CHLORIDE 0.9 % IJ SOLN
3.0000 mL | Freq: Two times a day (BID) | INTRAMUSCULAR | Status: DC
  Administered 2014-04-16 – 2014-04-17 (×3): 3 mL via INTRAVENOUS

## 2014-04-15 MED ORDER — ONDANSETRON HCL 4 MG/2ML IJ SOLN: 4.0000 mg | Freq: Four times a day (QID) | INTRAMUSCULAR | Status: DC | PRN

## 2014-04-15 MED ORDER — DEXTROSE 5 % IV SOLN
1.5000 g | Freq: Two times a day (BID) | INTRAVENOUS | Status: AC
  Administered 2014-04-15 – 2014-04-17 (×4): 1.5 g via INTRAVENOUS
  Filled 2014-04-15 (×4): qty 1.5

## 2014-04-15 MED ORDER — ACETAMINOPHEN 500 MG PO TABS
1000.0000 mg | ORAL_TABLET | Freq: Four times a day (QID) | ORAL | Status: DC
  Administered 2014-04-16 – 2014-04-19 (×14): 1000 mg via ORAL
  Filled 2014-04-15 (×19): qty 2

## 2014-04-15 MED ORDER — PROPOFOL 10 MG/ML IV BOLUS
INTRAVENOUS | Status: DC | PRN
  Administered 2014-04-15: 30 mg via INTRAVENOUS
  Administered 2014-04-15: 10 mg via INTRAVENOUS

## 2014-04-15 MED ORDER — ROCURONIUM BROMIDE 100 MG/10ML IV SOLN
INTRAVENOUS | Status: DC | PRN
  Administered 2014-04-15: 50 mg via INTRAVENOUS

## 2014-04-15 MED ORDER — PHENYLEPHRINE HCL 10 MG/ML IJ SOLN
INTRAMUSCULAR | Status: DC | PRN
  Administered 2014-04-15 (×3): 40 ug via INTRAVENOUS

## 2014-04-15 MED ORDER — MAGNESIUM SULFATE 4000MG/100ML IJ SOLN
4.0000 g | Freq: Once | INTRAMUSCULAR | Status: AC
  Administered 2014-04-15: 4 g via INTRAVENOUS
  Filled 2014-04-15: qty 100

## 2014-04-15 MED ORDER — ACETAMINOPHEN 160 MG/5ML PO SOLN: 650.0000 mg | Freq: Once | ORAL | Status: AC

## 2014-04-15 MED ORDER — BISACODYL 10 MG RE SUPP: 10.0000 mg | Freq: Every day | RECTAL | Status: DC

## 2014-04-15 MED ORDER — LIDOCAINE HCL (CARDIAC) 20 MG/ML IV SOLN
INTRAVENOUS | Status: AC
  Filled 2014-04-15: qty 5

## 2014-04-15 MED ORDER — SODIUM CHLORIDE 0.9 % IJ SOLN
OROMUCOSAL | Status: DC | PRN
  Administered 2014-04-15: 10:00:00 via TOPICAL

## 2014-04-15 MED ORDER — HEPARIN SODIUM (PORCINE) 1000 UNIT/ML IJ SOLN
INTRAMUSCULAR | Status: AC
  Filled 2014-04-15: qty 2

## 2014-04-15 MED ORDER — DOCUSATE SODIUM 100 MG PO CAPS
200.0000 mg | ORAL_CAPSULE | Freq: Every day | ORAL | Status: DC
  Administered 2014-04-16 – 2014-04-19 (×4): 200 mg via ORAL
  Filled 2014-04-15 (×4): qty 2

## 2014-04-15 MED ORDER — ROCURONIUM BROMIDE 50 MG/5ML IV SOLN
INTRAVENOUS | Status: AC
  Filled 2014-04-15: qty 2

## 2014-04-15 MED ORDER — SODIUM CHLORIDE 0.9 % IV SOLN
250.0000 mL | INTRAVENOUS | Status: AC
Start: 2014-04-15 — End: 2014-04-15
  Administered 2014-04-15: 250 mL via INTRAVENOUS

## 2014-04-15 MED ORDER — PANTOPRAZOLE SODIUM 40 MG PO TBEC
40.0000 mg | DELAYED_RELEASE_TABLET | Freq: Every day | ORAL | Status: DC
Start: 2014-04-17 — End: 2014-04-19
  Administered 2014-04-17 – 2014-04-19 (×3): 40 mg via ORAL
  Filled 2014-04-15 (×3): qty 1

## 2014-04-15 MED ORDER — PROTAMINE SULFATE 10 MG/ML IV SOLN
INTRAVENOUS | Status: AC
  Filled 2014-04-15: qty 50

## 2014-04-15 MED ORDER — SODIUM CHLORIDE 0.9 % IV SOLN
INTRAVENOUS | Status: DC
  Administered 2014-04-15: 13:00:00 via INTRAVENOUS

## 2014-04-15 MED ORDER — VANCOMYCIN HCL IN DEXTROSE 1-5 GM/200ML-% IV SOLN
1000.0000 mg | Freq: Once | INTRAVENOUS | Status: AC
  Administered 2014-04-15: 1000 mg via INTRAVENOUS
  Filled 2014-04-15: qty 200

## 2014-04-15 MED ORDER — LACTATED RINGERS IV SOLN: 500.0000 mL | Freq: Once | INTRAVENOUS | Status: AC | PRN

## 2014-04-15 MED ORDER — CEFUROXIME SODIUM 1.5 G IJ SOLR
1.5000 g | INTRAMUSCULAR | Status: DC | PRN
  Administered 2014-04-15: 1.5 g via INTRAVENOUS
  Administered 2014-04-15: .75 g via INTRAVENOUS

## 2014-04-15 MED ORDER — ROCURONIUM BROMIDE 50 MG/5ML IV SOLN
INTRAVENOUS | Status: AC
  Filled 2014-04-15: qty 1

## 2014-04-15 MED ORDER — HEPARIN SODIUM (PORCINE) 1000 UNIT/ML IJ SOLN
INTRAMUSCULAR | Status: AC
  Filled 2014-04-15: qty 1

## 2014-04-15 MED ORDER — SODIUM CHLORIDE 0.9 % IJ SOLN: 3.0000 mL | INTRAMUSCULAR | Status: DC | PRN

## 2014-04-15 MED ORDER — SODIUM CHLORIDE 0.9 % IV SOLN
INTRAVENOUS | Status: AC
  Administered 2014-04-15: 20:00:00 via INTRAVENOUS
  Filled 2014-04-15: qty 1

## 2014-04-15 MED ORDER — MORPHINE SULFATE 2 MG/ML IJ SOLN
2.0000 mg | INTRAMUSCULAR | Status: DC | PRN
  Administered 2014-04-15 (×2): 2 mg via INTRAVENOUS
  Administered 2014-04-16: 4 mg via INTRAVENOUS
  Administered 2014-04-16 (×2): 2 mg via INTRAVENOUS
  Filled 2014-04-15 (×2): qty 1
  Filled 2014-04-15: qty 2
  Filled 2014-04-15 (×2): qty 1

## 2014-04-15 MED ORDER — POTASSIUM CHLORIDE 10 MEQ/50ML IV SOLN
10.0000 meq | INTRAVENOUS | Status: AC
  Administered 2014-04-15 (×3): 10 meq via INTRAVENOUS

## 2014-04-15 MED ORDER — METOPROLOL TARTRATE 25 MG/10 ML ORAL SUSPENSION
12.5000 mg | Freq: Two times a day (BID) | ORAL | Status: DC
  Filled 2014-04-15 (×3): qty 5

## 2014-04-15 MED ORDER — DEXMEDETOMIDINE HCL IN NACL 200 MCG/50ML IV SOLN
0.1000 ug/kg/h | INTRAVENOUS | Status: DC
  Administered 2014-04-15 (×2): 0.7 ug/kg/h via INTRAVENOUS
  Filled 2014-04-15 (×2): qty 50

## 2014-04-15 MED ORDER — LACTATED RINGERS IV SOLN
INTRAVENOUS | Status: DC
  Administered 2014-04-15: 13:00:00 via INTRAVENOUS

## 2014-04-15 MED ORDER — METOPROLOL TARTRATE 1 MG/ML IV SOLN: 2.5000 mg | INTRAVENOUS | Status: DC | PRN

## 2014-04-15 MED ORDER — NITROGLYCERIN IN D5W 200-5 MCG/ML-% IV SOLN
0.0000 ug/min | INTRAVENOUS | Status: DC
  Administered 2014-04-15: 5 ug/min via INTRAVENOUS
  Administered 2014-04-16: 20 ug/min via INTRAVENOUS
  Filled 2014-04-15: qty 250

## 2014-04-15 MED ORDER — MIDAZOLAM HCL 5 MG/ML IJ SOLN
INTRAMUSCULAR | Status: DC | PRN
  Administered 2014-04-15: 2 mg via INTRAVENOUS
  Administered 2014-04-15 (×2): 3 mg via INTRAVENOUS
  Administered 2014-04-15: 2 mg via INTRAVENOUS

## 2014-04-15 MED ORDER — SUCCINYLCHOLINE CHLORIDE 20 MG/ML IJ SOLN
INTRAMUSCULAR | Status: AC
  Filled 2014-04-15: qty 1

## 2014-04-15 MED ORDER — OXYCODONE HCL 5 MG PO TABS
5.0000 mg | ORAL_TABLET | ORAL | Status: DC | PRN
  Administered 2014-04-16 – 2014-04-17 (×3): 10 mg via ORAL
  Administered 2014-04-17 – 2014-04-18 (×3): 5 mg via ORAL
  Administered 2014-04-18: 10 mg via ORAL
  Administered 2014-04-18: 5 mg via ORAL
  Filled 2014-04-15 (×2): qty 2
  Filled 2014-04-15: qty 1
  Filled 2014-04-15: qty 2
  Filled 2014-04-15: qty 1
  Filled 2014-04-15 (×2): qty 2
  Filled 2014-04-15: qty 1

## 2014-04-15 MED ORDER — SUCCINYLCHOLINE CHLORIDE 20 MG/ML IJ SOLN
INTRAMUSCULAR | Status: DC | PRN
  Administered 2014-04-15: 100 mg via INTRAVENOUS

## 2014-04-15 MED ORDER — FAMOTIDINE IN NACL 20-0.9 MG/50ML-% IV SOLN
20.0000 mg | Freq: Two times a day (BID) | INTRAVENOUS | Status: AC
  Administered 2014-04-15: 20 mg via INTRAVENOUS

## 2014-04-15 MED ORDER — ASPIRIN EC 325 MG PO TBEC
325.0000 mg | DELAYED_RELEASE_TABLET | Freq: Every day | ORAL | Status: DC
  Administered 2014-04-16 – 2014-04-19 (×4): 325 mg via ORAL
  Filled 2014-04-15 (×4): qty 1

## 2014-04-15 MED ORDER — HEPARIN SODIUM (PORCINE) 1000 UNIT/ML IJ SOLN
INTRAMUSCULAR | Status: DC | PRN
  Administered 2014-04-15: 3000 [IU] via INTRAVENOUS
  Administered 2014-04-15: 42000 [IU] via INTRAVENOUS

## 2014-04-15 MED ORDER — MIDAZOLAM HCL 10 MG/2ML IJ SOLN
INTRAMUSCULAR | Status: AC
  Filled 2014-04-15: qty 2

## 2014-04-15 MED ORDER — MIDAZOLAM HCL 2 MG/2ML IJ SOLN
2.0000 mg | INTRAMUSCULAR | Status: DC | PRN
Start: 2014-04-15 — End: 2014-04-17
  Administered 2014-04-15: 2 mg via INTRAVENOUS
  Filled 2014-04-15: qty 2

## 2014-04-15 MED ORDER — BISACODYL 5 MG PO TBEC
10.0000 mg | DELAYED_RELEASE_TABLET | Freq: Every day | ORAL | Status: DC
  Administered 2014-04-16 – 2014-04-18 (×3): 10 mg via ORAL
  Filled 2014-04-15 (×3): qty 2

## 2014-04-15 MED FILL — Sodium Bicarbonate IV Soln 8.4%: INTRAVENOUS | Qty: 50 | Status: AC

## 2014-04-15 MED FILL — Sodium Chloride IV Soln 0.9%: INTRAVENOUS | Qty: 2000 | Status: AC

## 2014-04-15 MED FILL — Heparin Sodium (Porcine) Inj 1000 Unit/ML: INTRAMUSCULAR | Qty: 10 | Status: AC

## 2014-04-15 MED FILL — Mannitol IV Soln 20%: INTRAVENOUS | Qty: 500 | Status: AC

## 2014-04-15 MED FILL — Electrolyte-R (PH 7.4) Solution: INTRAVENOUS | Qty: 3000 | Status: AC

## 2014-04-15 MED FILL — Lidocaine HCl IV Inj 20 MG/ML: INTRAVENOUS | Qty: 5 | Status: AC

## 2014-04-15 SURGICAL SUPPLY — 124 items
ADAPTER CARDIO PERF ANTE/RETRO (ADAPTER) IMPLANT
APPLIER CLIP 9.375 MED OPEN (MISCELLANEOUS)
APPLIER CLIP 9.375 SM OPEN (CLIP)
ATTRACTOMAT 16X20 MAGNETIC DRP (DRAPES) ×3 IMPLANT
BAG DECANTER FOR FLEXI CONT (MISCELLANEOUS) ×3 IMPLANT
BANDAGE ELASTIC 4 VELCRO ST LF (GAUZE/BANDAGES/DRESSINGS) ×3 IMPLANT
BANDAGE ELASTIC 6 VELCRO ST LF (GAUZE/BANDAGES/DRESSINGS) ×3 IMPLANT
BANDAGE GAUZE 4  KLING STR (GAUZE/BANDAGES/DRESSINGS) ×3 IMPLANT
BANDAGE GAUZE ELAST BULKY 4 IN (GAUZE/BANDAGES/DRESSINGS) ×3 IMPLANT
BASKET HEART (ORDER IN 25'S) (MISCELLANEOUS) ×1
BASKET HEART (ORDER IN 25S) (MISCELLANEOUS) ×2 IMPLANT
BENZOIN TINCTURE PRP APPL 2/3 (GAUZE/BANDAGES/DRESSINGS) ×3 IMPLANT
BLADE STERNUM SYSTEM 6 (BLADE) ×3 IMPLANT
BLADE SURG 11 STRL SS (BLADE) ×3 IMPLANT
BLADE SURG ROTATE 9660 (MISCELLANEOUS) IMPLANT
CANISTER SUCTION 2500CC (MISCELLANEOUS) ×3 IMPLANT
CANNULA EZ GLIDE AORTIC 21FR (CANNULA) ×6 IMPLANT
CANNULA GUNDRY RCSP 15FR (MISCELLANEOUS) IMPLANT
CANNULA VENOUS LOW PROF 34X46 (CANNULA) ×3 IMPLANT
CARDIAC SUCTION (MISCELLANEOUS) ×3 IMPLANT
CATH CPB KIT OWEN (MISCELLANEOUS) ×3 IMPLANT
CATH THORACIC 28FR (CATHETERS) IMPLANT
CATH THORACIC 28FR RT ANG (CATHETERS) IMPLANT
CATH THORACIC 36FR (CATHETERS) ×3 IMPLANT
CATH THORACIC 36FR RT ANG (CATHETERS) ×3 IMPLANT
CLIP APPLIE 9.375 MED OPEN (MISCELLANEOUS) IMPLANT
CLIP APPLIE 9.375 SM OPEN (CLIP) IMPLANT
CLIP FOGARTY SPRING 6M (CLIP) IMPLANT
CLIP TI MEDIUM 24 (CLIP) IMPLANT
CLIP TI WIDE RED SMALL 24 (CLIP) IMPLANT
CONN Y 3/8X3/8X3/8  BEN (MISCELLANEOUS)
CONN Y 3/8X3/8X3/8 BEN (MISCELLANEOUS) IMPLANT
COVER SURGICAL LIGHT HANDLE (MISCELLANEOUS) ×3 IMPLANT
CRADLE DONUT ADULT HEAD (MISCELLANEOUS) ×3 IMPLANT
DERMABOND ADVANCED (GAUZE/BANDAGES/DRESSINGS) ×1
DERMABOND ADVANCED .7 DNX12 (GAUZE/BANDAGES/DRESSINGS) ×2 IMPLANT
DRAIN CHANNEL 32F RND 10.7 FF (WOUND CARE) ×6 IMPLANT
DRAPE CARDIOVASCULAR INCISE (DRAPES) ×1
DRAPE INCISE IOBAN 66X45 STRL (DRAPES) ×3 IMPLANT
DRAPE SLUSH/WARMER DISC (DRAPES) ×3 IMPLANT
DRAPE SRG 135X102X78XABS (DRAPES) ×2 IMPLANT
DRSG AQUACEL AG ADV 3.5X14 (GAUZE/BANDAGES/DRESSINGS) ×3 IMPLANT
DRSG COVADERM 4X14 (GAUZE/BANDAGES/DRESSINGS) ×3 IMPLANT
ELECT BLADE 4.0 EZ CLEAN MEGAD (MISCELLANEOUS) ×6
ELECT REM PT RETURN 9FT ADLT (ELECTROSURGICAL) ×6
ELECTRODE BLDE 4.0 EZ CLN MEGD (MISCELLANEOUS) ×4 IMPLANT
ELECTRODE REM PT RTRN 9FT ADLT (ELECTROSURGICAL) ×4 IMPLANT
GLOVE BIO SURGEON STRL SZ 6 (GLOVE) ×6 IMPLANT
GLOVE BIO SURGEON STRL SZ 6.5 (GLOVE) ×6 IMPLANT
GLOVE BIO SURGEON STRL SZ7 (GLOVE) IMPLANT
GLOVE BIO SURGEON STRL SZ7.5 (GLOVE) IMPLANT
GLOVE BIOGEL PI IND STRL 6 (GLOVE) ×2 IMPLANT
GLOVE BIOGEL PI IND STRL 6.5 (GLOVE) ×2 IMPLANT
GLOVE BIOGEL PI IND STRL 7.0 (GLOVE) ×8 IMPLANT
GLOVE BIOGEL PI INDICATOR 6 (GLOVE) ×1
GLOVE BIOGEL PI INDICATOR 6.5 (GLOVE) ×1
GLOVE BIOGEL PI INDICATOR 7.0 (GLOVE) ×4
GLOVE EUDERMIC 7 POWDERFREE (GLOVE) IMPLANT
GLOVE ORTHO TXT STRL SZ7.5 (GLOVE) ×6 IMPLANT
GOWN STRL REUS W/ TWL LRG LVL3 (GOWN DISPOSABLE) ×8 IMPLANT
GOWN STRL REUS W/TWL LRG LVL3 (GOWN DISPOSABLE) ×4
HEMOSTAT POWDER SURGIFOAM 1G (HEMOSTASIS) ×9 IMPLANT
INSERT FOGARTY 61MM (MISCELLANEOUS) IMPLANT
INSERT FOGARTY XLG (MISCELLANEOUS) ×3 IMPLANT
KIT BASIN OR (CUSTOM PROCEDURE TRAY) ×3 IMPLANT
KIT ROOM TURNOVER OR (KITS) ×3 IMPLANT
KIT SUCTION CATH 14FR (SUCTIONS) ×15 IMPLANT
KIT VASOVIEW W/TROCAR VH 2000 (KITS) ×3 IMPLANT
LEAD PACING MYOCARDI (MISCELLANEOUS) ×3 IMPLANT
MARKER GRAFT CORONARY BYPASS (MISCELLANEOUS) ×9 IMPLANT
NS IRRIG 1000ML POUR BTL (IV SOLUTION) ×15 IMPLANT
PACK OPEN HEART (CUSTOM PROCEDURE TRAY) ×3 IMPLANT
PAD ARMBOARD 7.5X6 YLW CONV (MISCELLANEOUS) ×3 IMPLANT
PAD ELECT DEFIB RADIOL ZOLL (MISCELLANEOUS) ×3 IMPLANT
PENCIL BUTTON HOLSTER BLD 10FT (ELECTRODE) ×6 IMPLANT
PUNCH AORTIC ROTATE 4.0MM (MISCELLANEOUS) IMPLANT
PUNCH AORTIC ROTATE 4.5MM 8IN (MISCELLANEOUS) IMPLANT
PUNCH AORTIC ROTATE 5MM 8IN (MISCELLANEOUS) IMPLANT
SET CARDIOPLEGIA MPS 5001102 (MISCELLANEOUS) ×3 IMPLANT
SOLUTION ANTI FOG 6CC (MISCELLANEOUS) IMPLANT
SPONGE GAUZE 4X4 12PLY (GAUZE/BANDAGES/DRESSINGS) ×6 IMPLANT
SPONGE GAUZE 4X4 12PLY STER LF (GAUZE/BANDAGES/DRESSINGS) ×9 IMPLANT
SPONGE LAP 18X18 X RAY DECT (DISPOSABLE) IMPLANT
SPONGE LAP 4X18 X RAY DECT (DISPOSABLE) IMPLANT
SUT BONE WAX W31G (SUTURE) ×3 IMPLANT
SUT ETHIBOND X763 2 0 SH 1 (SUTURE) ×6 IMPLANT
SUT MNCRL AB 3-0 PS2 18 (SUTURE) ×6 IMPLANT
SUT MNCRL AB 4-0 PS2 18 (SUTURE) ×3 IMPLANT
SUT PDS AB 1 CTX 36 (SUTURE) ×6 IMPLANT
SUT PROLENE 2 0 SH DA (SUTURE) IMPLANT
SUT PROLENE 3 0 SH DA (SUTURE) ×3 IMPLANT
SUT PROLENE 3 0 SH1 36 (SUTURE) IMPLANT
SUT PROLENE 4 0 RB 1 (SUTURE) ×1
SUT PROLENE 4 0 SH DA (SUTURE) IMPLANT
SUT PROLENE 4-0 RB1 .5 CRCL 36 (SUTURE) ×2 IMPLANT
SUT PROLENE 5 0 C 1 36 (SUTURE) IMPLANT
SUT PROLENE 6 0 C 1 30 (SUTURE) IMPLANT
SUT PROLENE 7.0 RB 3 (SUTURE) ×9 IMPLANT
SUT PROLENE 8 0 BV175 6 (SUTURE) ×3 IMPLANT
SUT PROLENE BLUE 7 0 (SUTURE) ×3 IMPLANT
SUT PROLENE POLY MONO (SUTURE) IMPLANT
SUT SILK  1 MH (SUTURE) ×1
SUT SILK 1 MH (SUTURE) ×2 IMPLANT
SUT STEEL 6MS V (SUTURE) IMPLANT
SUT STEEL STERNAL CCS#1 18IN (SUTURE) IMPLANT
SUT STEEL SZ 6 DBL 3X14 BALL (SUTURE) IMPLANT
SUT VIC AB 1 CTX 36 (SUTURE)
SUT VIC AB 1 CTX36XBRD ANBCTR (SUTURE) IMPLANT
SUT VIC AB 2-0 CT1 27 (SUTURE) ×1
SUT VIC AB 2-0 CT1 TAPERPNT 27 (SUTURE) ×2 IMPLANT
SUT VIC AB 2-0 CTX 27 (SUTURE) IMPLANT
SUT VIC AB 3-0 SH 27 (SUTURE)
SUT VIC AB 3-0 SH 27X BRD (SUTURE) IMPLANT
SUT VIC AB 3-0 X1 27 (SUTURE) IMPLANT
SUT VICRYL 4-0 PS2 18IN ABS (SUTURE) IMPLANT
SUTURE E-PAK OPEN HEART (SUTURE) ×3 IMPLANT
SYSTEM SAHARA CHEST DRAIN ATS (WOUND CARE) ×3 IMPLANT
TAPE CLOTH SURG 4X10 WHT LF (GAUZE/BANDAGES/DRESSINGS) ×3 IMPLANT
TOWEL OR 17X24 6PK STRL BLUE (TOWEL DISPOSABLE) ×6 IMPLANT
TOWEL OR 17X26 10 PK STRL BLUE (TOWEL DISPOSABLE) ×6 IMPLANT
TRAY FOLEY IC TEMP SENS 16FR (CATHETERS) ×3 IMPLANT
TUBING INSUFFLATION 10FT LAP (TUBING) ×3 IMPLANT
UNDERPAD 30X30 INCONTINENT (UNDERPADS AND DIAPERS) ×3 IMPLANT
WATER STERILE IRR 1000ML POUR (IV SOLUTION) ×6 IMPLANT

## 2014-04-15 NOTE — Op Note (Signed)
CARDIOTHORACIC SURGERY OPERATIVE NOTE  Date of Procedure: 04/15/2014  Preoperative Diagnosis: Severe Left Main Coronary Artery Disease  Postoperative Diagnosis: Same  Procedure:    Coronary Artery Bypass Grafting x 2   Left Internal Mammary Artery to Distal Left Anterior Descending Coronary Artery  Saphenous Vein Graft to First Obtuse Marginal Branch of Left Circumflex Coronary Artery  Endoscopic Vein Harvest from Right Thigh  Surgeon: Valentina Gu. Roxy Manns, MD  Assistant: Ellwood Handler, PA-C  Anesthesia: Laurie Panda, MD  Operative Findings: Mild global LV systolic dysfunction  Mild LV hypertrophy  Good quality LIMA conduit for grafting  Good quality SVG conduit for grafting  Good quality target vessels for grafting Adhesions surrounding the left lung which essentially obliterated the left pleural space    BRIEF CLINICAL NOTE AND INDICATIONS FOR SURGERY  Patient is a 73 year old morbidly obese white male from [place] with known history of coronary artery disease, hypertension, and hyperlipidemia referred for surgical treatment of left main disease. The patient's cardiac history dates back to 1998 when he suffered an inferior wall myocardial infarction. He was treated with PCI and stenting. He did well until 2009 when he suffered another inferior wall myocardial infarction. He was again treated with PCI and stenting of the right coronary artery. He continued to do well from a cardiac standpoint until approximately 6-8 weeks ago when he began to experience progressive symptoms of exertional shortness of breath. Initially symptoms occurred only with relatively strenuous exertion, such as when the patient was at the gym performing exercise. However, symptoms progressed, and more recently the patient has developed exertional shortness of breath with relatively mild activity. With more strenuous activities he has had some mild discomfort in his chest. He denies any symptoms at rest. He has not  had any nocturnal symptoms. He has experienced fluid retention and lower extremity edema. He denies any PND, orthopnea, palpitations, dizzy spells, or syncope. He was seen in followup by Dr. Stanford Breed on 04/01/2014. An echocardiogram and nuclear stress test were performed. Echocardiogram revealed normal left ventricular function but the nuclear stress test was high-risk positive with findings consistent with anterior wall ischemia. The patient was brought in for elective diagnostic cardiac catheterization today by Dr. Tamala Julian. Catheterization revealed severe left main disease. Cardiothoracic surgical consultation was requested. The patient has been seen in consultation and counseled at length regarding the indications, risks and potential benefits of surgery.  All questions have been answered, and the patient provides full informed consent for the operation as described.      DETAILS OF THE OPERATIVE PROCEDURE  Preparation:  The patient is brought to the operating room on the above mentioned date and central monitoring was established by the anesthesia team including placement of Swan-Ganz catheter and radial arterial line. The patient is placed in the supine position on the operating table.  Intravenous antibiotics are administered. General endotracheal anesthesia is induced uneventfully. A Foley catheter is placed.  Baseline transesophageal echocardiogram was performed.  Findings were notable for mild global LV systolic dysfunction and mild LVH.  There was mild mitral regurgitation and mild tricuspid regurgitation.  The patient's chest, abdomen, both groins, and both lower extremities are prepared and draped in a sterile manner. A time out procedure is performed.   Surgical Approach and Conduit Harvest:  A median sternotomy incision was performed and the left internal mammary artery is dissected from the chest wall and prepared for bypass grafting. The left internal mammary artery is notably good  quality conduit. There were adhesions surrounding the  left lung which essentially obliterated the left pleural space.  Simultaneously, saphenous vein is obtained from the patient's right thigh using endoscopic vein harvest technique. The saphenous vein is notably good quality conduit. After removal of the saphenous vein, the small surgical incisions in the lower extremity are closed with absorbable suture. Following systemic heparinization, the left internal mammary artery was transected distally noted to have excellent flow.   Extracorporeal Cardiopulmonary Bypass and Myocardial Protection:  The pericardium is opened. The ascending aorta is normal in appearance. The ascending aorta and the right atrium are cannulated for cardioplegia bypass.  Adequate heparinization is verified.     The entire pre-bypass portion of the operation was notable for stable hemodynamics.  Cardiopulmonary bypass was begun and the surface of the heart is inspected. Distal target vessels are selected for coronary artery bypass grafting. A cardioplegia cannula is placed in the ascending aorta.  A temperature probe was placed in the interventricular septum.  The patient is allowed to cool passively to [facility] systemic temperature.  The aortic cross clamp is applied and cold blood cardioplegia is delivered initially in an antegrade fashion through the aortic root.   Iced saline slush is applied for topical hypothermia.  The initial cardioplegic arrest is rapid with early diastolic arrest.  Repeat doses of cardioplegia are administered intermittently throughout the entire cross clamp portion of the operation through the aortic root in order to maintain completely flat electrocardiogram and septal myocardial temperature below 15C.  Myocardial protection was felt to be excellent.  Coronary Artery Bypass Grafting:   The first obtuse marginal branch of the left circumflex coronary artery was grafted using a reversed saphenous vein  graft in an end-to-side fashion.  At the site of distal anastomosis the target vessel was good quality and measured approximately 2.2 mm in diameter.  The distal left anterior coronary artery was grafted with the left internal mammary artery in an end-to-side fashion.  At the site of distal anastomosis the target vessel was good quality and measured approximately 2.0 mm in diameter.  All proximal vein graft anastomoses were placed directly to the ascending aorta prior to removal of the aortic cross clamp.  The septal myocardial temperature rose rapidly after reperfusion of the left internal mammary artery graft.  The aortic cross clamp was removed after a total cross clamp time of 37 minutes.   Procedure Completion:  All proximal and distal coronary anastomoses were inspected for hemostasis and appropriate graft orientation. Epicardial pacing wires are fixed to the right ventricular outflow tract and to the right atrial appendage. The patient is rewarmed to 37C temperature. The patient is weaned and disconnected from cardiopulmonary bypass.  The patient's rhythm at separation from bypass was sinus.  The patient was weaned from cardioplegic bypass without any inotropic support. Total cardiopulmonary bypass time for the operation was 48 minutes.  Followup transesophageal echocardiogram performed after separation from bypass revealed no changes from the preoperative exam.  The aortic and venous cannula were removed uneventfully. Protamine was administered to reverse the anticoagulation. The mediastinum and pleural space were inspected for hemostasis and irrigated with saline solution. The mediastinum and the left pleural space were drained using 3 chest tubes placed through separate stab incisions inferiorly.  The soft tissues anterior to the aorta were reapproximated loosely. The sternum is closed with double strength sternal wire. The soft tissues anterior to the sternum were closed in multiple layers  and the skin is closed with a running subcuticular skin closure.  The post-bypass portion  of the operation was notable for stable rhythm and hemodynamics.  No blood products were administered during the operation.   Disposition:  The patient tolerated the procedure well and is transported to the surgical intensive care in stable condition. There are no intraoperative complications. All sponge instrument and needle counts are verified correct at completion of the operation.    Valentina Gu. Roxy Manns MD 04/15/2014 11:52 AM

## 2014-04-15 NOTE — Transfer of Care (Signed)
Immediate Anesthesia Transfer of Care Note  Patient: Jeffrey Wiggins  Procedure(s) Performed: Procedure(s): CORONARY ARTERY BYPASS GRAFTING TIMES TWO USEING LEFT INTERNAL MAMMARY ARTERY AND RIGHT GREATER SAPHENOUS VEIN VIA ENDOVEIN HARVEST (N/A) INTRAOPERATIVE TRANSESOPHAGEAL ECHOCARDIOGRAM (N/A)  Patient Location:ICU  Anesthesia Type:General  Level of Consciousness: sedated and unresponsive  Airway & Oxygen Therapy: Patient remains intubated per anesthesia plan and Patient placed on Ventilator (see vital sign flow sheet for setting)  Post-op Assessment: Report given to PACU RN and Post -op Vital signs reviewed and stable  Post vital signs: Reviewed and stable  Complications: No apparent anesthesia complications

## 2014-04-15 NOTE — Progress Notes (Signed)
S/p CABG x 2  Extubated  BP 95/67  Pulse 70  Temp(Src) 97.7 F (36.5 C) (Core (Comment))  Resp 16  Ht 5\' 7"  (1.702 m)  Wt 282 lb 6.6 oz (128.1 kg)  BMI 44.22 kg/m2  SpO2 93%  30/16 CI=2.2   Intake/Output Summary (Last 24 hours) at 04/15/14 1838 Last data filed at 04/15/14 1800  Gross per 24 hour  Intake 4825.51 ml  Output   4045 ml  Net 780.51 ml   Cr= 1.22  Doing well early postop

## 2014-04-15 NOTE — Procedures (Signed)
Extubation Procedure Note  Patient Details:   Name: Jeffrey Wiggins DOB: 09-Mar-1941 MRN: [id number]   Airway Documentation:  Airway (Active)    Evaluation  O2 sats: stable throughout Complications: No apparent complications Patient did tolerate procedure well. Bilateral Breath Sounds: Clear Suctioning: Airway Yes  Patient extubated to 4L [place].  NIF of -40, VC of 950.  Positive cuff leak.  IS performed x4 of 750.  Vitals currently stable.  RT will continue to monitor.  Philomena Doheny 04/15/2014, 6:14 PM

## 2014-04-15 NOTE — Brief Op Note (Addendum)
04/13/2014 - 04/15/2014  10:51 AM  PATIENT:  Jeffrey Wiggins  73 y.o. male  PRE-OPERATIVE DIAGNOSIS:  CAD  POST-OPERATIVE DIAGNOSIS:  coronary artery disease  PROCEDURE:  Procedure(s):  CORONARY ARTERY BYPASS GRAFTING x 2 -LIMA to LAD -SVG to OM 1  ENDOSCOPIC SAPHENOUS VEIN HARVEST RIGHT THIGH  INTRAOPERATIVE TRANSESOPHAGEAL ECHOCARDIOGRAM (N/A)  SURGEON:    Rexene Alberts, MD  ASSISTANTS:  Ellwood Handler, PA-C  ANESTHESIA:   Laurie Panda, MD  CROSSCLAMP TIME:   37'  CARDIOPULMONARY BYPASS TIME: 48'  FINDINGS:  Mild global LV systolic dysfunction  Mild LV hypertrophy  Good quality LIMA conduit for grafting  Good quality SVG conduit for grafting  Good quality target vessels for grafting  COMPLICATIONS: None  BASELINE WEIGHT: 128 kg  PATIENT DISPOSITION:   TO SICU IN STABLE CONDITION  Adriana Quinby H 04/15/2014 11:47 AM

## 2014-04-15 NOTE — Progress Notes (Signed)
  Echocardiogram Echocardiogram Transesophageal has been performed.  Mauricio Po 04/15/2014, 8:15 AM

## 2014-04-15 NOTE — Anesthesia Postprocedure Evaluation (Signed)
  Anesthesia Post-op Note  Patient: Jeffrey Wiggins  Procedure(s) Performed: Procedure(s): CORONARY ARTERY BYPASS GRAFTING TIMES TWO USEING LEFT INTERNAL MAMMARY ARTERY AND RIGHT GREATER SAPHENOUS VEIN VIA ENDOVEIN HARVEST (N/A) INTRAOPERATIVE TRANSESOPHAGEAL ECHOCARDIOGRAM (N/A)  Patient Location: ICU  Anesthesia Type:General  Level of Consciousness: sedated and unresponsive  Airway and Oxygen Therapy: Patient remains intubated per anesthesia plan and Patient placed on Ventilator (see vital sign flow sheet for setting)  Post-op Pain: pt sedated unable to evaluate  Post-op Assessment: Post-op Vital signs reviewed, Patient's Cardiovascular Status Stable, Respiratory Function Stable and Patent Airway  Post-op Vital Signs: Reviewed and stable  Last Vitals:  Filed Vitals:   04/15/14 0600  BP: 198/101  Pulse:   Temp:   Resp: 11    Complications: No apparent anesthesia complications

## 2014-04-15 NOTE — Anesthesia Procedure Notes (Signed)
Procedure Name: Intubation Date/Time: 04/15/2014 7:51 AM Performed by: Marinda Elk A Pre-anesthesia Checklist: Patient identified, Emergency Drugs available, Suction available and Patient being monitored Patient Re-evaluated:Patient Re-evaluated prior to inductionOxygen Delivery Method: Circle system utilized Preoxygenation: Pre-oxygenation with 100% oxygen Intubation Type: IV induction Ventilation: Oral airway inserted - appropriate to patient size Laryngoscope size: elective glidescope. Grade View: Grade I Tube type: Oral Tube size: 7.5 mm Number of attempts: 1 Airway Equipment and Method: Stylet and Video-laryngoscopy Placement Confirmation: ETT inserted through vocal cords under direct vision,  positive ETCO2 and CO2 detector Secured at: 21 cm Tube secured with: Tape Dental Injury: Teeth and Oropharynx as per pre-operative assessment

## 2014-04-15 NOTE — Anesthesia Preprocedure Evaluation (Addendum)
Anesthesia Evaluation  Patient identified by MRN, date of birth, ID band Patient awake    Reviewed: Allergy & Precautions, H&P , NPO status , Patient's Chart, lab work & pertinent test results, reviewed documented beta blocker date and time   History of Anesthesia Complications Negative for: history of anesthetic complications  Airway Mallampati: III TM Distance: >3 FB Neck ROM: Full    Dental  (+) Teeth Intact, Dental Advisory Given   Pulmonary shortness of breath and with exertion, sleep apnea , neg COPDformer smoker,  breath sounds clear to auscultation        Cardiovascular hypertension, Pt. on medications + CAD, + Past MI, + Cardiac Stents, + Peripheral Vascular Disease and +CHF Rhythm:Regular     Neuro/Psych negative neurological ROS  negative psych ROS   GI/Hepatic GERD-  Medicated and Controlled,(+) Hepatitis -Unclear etiology, when patient 50-12 yo   Endo/Other  Morbid obesity  Renal/GU negative Renal ROS     Musculoskeletal   Abdominal   Peds  Hematology negative hematology ROS (+)   Anesthesia Other Findings   Reproductive/Obstetrics                          Anesthesia Physical Anesthesia Plan  ASA: IV  Anesthesia Plan: General   Post-op Pain Management:    Induction: Intravenous  Airway Management Planned: Oral ETT  Additional Equipment: Arterial line, TEE, CVP, PA Cath and Ultrasound Guidance Line Placement  Intra-op Plan:   Post-operative Plan: Post-operative intubation/ventilation  Informed Consent: I have reviewed the patients History and Physical, chart, labs and discussed the procedure including the risks, benefits and alternatives for the proposed anesthesia with the patient or authorized representative who has indicated his/her understanding and acceptance.   Dental advisory given  Plan Discussed with: CRNA and Surgeon  Anesthesia Plan Comments:          Anesthesia Quick Evaluation

## 2014-04-16 ENCOUNTER — Inpatient Hospital Stay (HOSPITAL_COMMUNITY): Payer: 59

## 2014-04-16 LAB — BLOOD GAS, ARTERIAL
Acid-Base Excess: 0.2 mmol/L (ref 0.0–2.0)
Bicarbonate: 24.8 mEq/L — ABNORMAL HIGH (ref 20.0–24.0)
DRAWN BY: 41877
FIO2: 0.35 %
O2 Saturation: 97.4 %
PCO2 ART: 44.3 mmHg (ref 35.0–45.0)
PH ART: 7.367 (ref 7.350–7.450)
PO2 ART: 100 mmHg (ref 80.0–100.0)
Patient temperature: 98.6
TCO2: 26.2 mmol/L (ref 0–100)

## 2014-04-16 LAB — POCT I-STAT, CHEM 8
BUN: 8 mg/dL (ref 6–23)
CALCIUM ION: 1.14 mmol/L (ref 1.13–1.30)
Chloride: 101 mEq/L (ref 96–112)
Creatinine, Ser: 0.9 mg/dL (ref 0.50–1.35)
Glucose, Bld: 145 mg/dL — ABNORMAL HIGH (ref 70–99)
HEMATOCRIT: 38 % — AB (ref 39.0–52.0)
Hemoglobin: 12.9 g/dL — ABNORMAL LOW (ref 13.0–17.0)
Potassium: 3.6 mEq/L — ABNORMAL LOW (ref 3.7–5.3)
SODIUM: 140 meq/L (ref 137–147)
TCO2: 22 mmol/L (ref 0–100)

## 2014-04-16 LAB — MAGNESIUM
Magnesium: 2.3 mg/dL (ref 1.5–2.5)
Magnesium: 2.2 mg/dL (ref 1.5–2.5)

## 2014-04-16 LAB — BASIC METABOLIC PANEL WITH GFR
Anion gap: 10 (ref 5–15)
BUN: 10 mg/dL (ref 6–23)
CO2: 24 meq/L (ref 19–32)
Calcium: 8.2 mg/dL — ABNORMAL LOW (ref 8.4–10.5)
Chloride: 105 meq/L (ref 96–112)
Creatinine, Ser: 0.93 mg/dL (ref 0.50–1.35)
GFR calc Af Amer: >90 mL/min
GFR calc non Af Amer: 82 mL/min — ABNORMAL LOW
Glucose, Bld: 96 mg/dL (ref 70–99)
Potassium: 4.2 meq/L (ref 3.7–5.3)
Sodium: 139 meq/L (ref 137–147)

## 2014-04-16 LAB — GLUCOSE, CAPILLARY
GLUCOSE-CAPILLARY: 88 mg/dL (ref 70–99)
GLUCOSE-CAPILLARY: 133 mg/dL — AB (ref 70–99)
GLUCOSE-CAPILLARY: 121 mg/dL — AB (ref 70–99)
GLUCOSE-CAPILLARY: 148 mg/dL — AB (ref 70–99)
GLUCOSE-CAPILLARY: 146 mg/dL — AB (ref 70–99)
Glucose-Capillary: 115 mg/dL — ABNORMAL HIGH (ref 70–99)
Glucose-Capillary: 151 mg/dL — ABNORMAL HIGH (ref 70–99)
Glucose-Capillary: 143 mg/dL — ABNORMAL HIGH (ref 70–99)
Glucose-Capillary: 155 mg/dL — ABNORMAL HIGH (ref 70–99)

## 2014-04-16 LAB — CBC
HCT: 32.9 % — ABNORMAL LOW (ref 39.0–52.0)
HEMATOCRIT: 33.3 % — AB (ref 39.0–52.0)
HEMOGLOBIN: 10.4 g/dL — AB (ref 13.0–17.0)
Hemoglobin: 10.2 g/dL — ABNORMAL LOW (ref 13.0–17.0)
MCH: 26.4 pg (ref 26.0–34.0)
MCH: 26.7 pg (ref 26.0–34.0)
MCHC: 31 g/dL (ref 30.0–36.0)
MCHC: 31.2 g/dL (ref 30.0–36.0)
MCV: 85 fL (ref 78.0–100.0)
MCV: 85.4 fL (ref 78.0–100.0)
Platelets: 195 10*3/uL (ref 150–400)
Platelets: 191 10*3/uL (ref 150–400)
RBC: 3.87 MIL/uL — ABNORMAL LOW (ref 4.22–5.81)
RBC: 3.9 MIL/uL — AB (ref 4.22–5.81)
RDW: 15.1 % (ref 11.5–15.5)
RDW: 15.5 % (ref 11.5–15.5)
WBC: 16.9 10*3/uL — ABNORMAL HIGH (ref 4.0–10.5)
WBC: 18.1 10*3/uL — AB (ref 4.0–10.5)

## 2014-04-16 LAB — CREATININE, SERUM
Creatinine, Ser: 0.8 mg/dL (ref 0.50–1.35)
GFR calc Af Amer: >90 mL/min (ref >90)
GFR calc non Af Amer: 87 mL/min — ABNORMAL LOW (ref >90)

## 2014-04-16 MED ORDER — FUROSEMIDE 10 MG/ML IJ SOLN
40.0000 mg | Freq: Once | INTRAMUSCULAR | Status: AC
  Administered 2014-04-16: 40 mg via INTRAVENOUS
  Filled 2014-04-16: qty 4

## 2014-04-16 MED ORDER — POTASSIUM CHLORIDE 10 MEQ/50ML IV SOLN
10.0000 meq | INTRAVENOUS | Status: AC
  Administered 2014-04-16 (×4): 10 meq via INTRAVENOUS
  Filled 2014-04-16 (×4): qty 50

## 2014-04-16 MED ORDER — INSULIN ASPART 100 UNIT/ML ~~LOC~~ SOLN
0.0000 [IU] | SUBCUTANEOUS | Status: DC
  Administered 2014-04-16 – 2014-04-17 (×3): 2 [IU] via SUBCUTANEOUS

## 2014-04-16 MED ORDER — INSULIN DETEMIR 100 UNIT/ML ~~LOC~~ SOLN
40.0000 [IU] | Freq: Once | SUBCUTANEOUS | Status: AC
  Administered 2014-04-16: 40 [IU] via SUBCUTANEOUS
  Filled 2014-04-16: qty 0.4

## 2014-04-16 MED ORDER — ENOXAPARIN SODIUM 40 MG/0.4ML ~~LOC~~ SOLN
40.0000 mg | Freq: Every day | SUBCUTANEOUS | Status: DC
  Administered 2014-04-16 – 2014-04-18 (×3): 40 mg via SUBCUTANEOUS
  Filled 2014-04-16 (×4): qty 0.4

## 2014-04-16 MED ORDER — METOPROLOL TARTRATE 25 MG PO TABS
25.0000 mg | ORAL_TABLET | Freq: Two times a day (BID) | ORAL | Status: DC
  Administered 2014-04-16 – 2014-04-19 (×7): 25 mg via ORAL
  Filled 2014-04-16 (×8): qty 1

## 2014-04-16 MED ORDER — METOPROLOL TARTRATE 25 MG/10 ML ORAL SUSPENSION
25.0000 mg | Freq: Two times a day (BID) | ORAL | Status: DC
  Filled 2014-04-16 (×6): qty 10

## 2014-04-16 MED ORDER — INSULIN DETEMIR 100 UNIT/ML ~~LOC~~ SOLN
40.0000 [IU] | Freq: Every day | SUBCUTANEOUS | Status: DC
  Filled 2014-04-16: qty 0.4

## 2014-04-16 NOTE — Progress Notes (Signed)
1 Day Post-Op Procedure(s) (LRB): CORONARY ARTERY BYPASS GRAFTING TIMES TWO USEING LEFT INTERNAL MAMMARY ARTERY AND RIGHT GREATER SAPHENOUS VEIN VIA ENDOVEIN HARVEST (N/A) INTRAOPERATIVE TRANSESOPHAGEAL ECHOCARDIOGRAM (N/A) Subjective: Some incisional pain Denies nausea  Objective: Vital signs in last 24 hours: Temp:  [96.6 F (35.9 C)-99.1 F (37.3 C)] 98.6 F (37 C) (08/01 0715) Pulse Rate:  [68-111] 88 (08/01 0715) Cardiac Rhythm:  [-] Atrial paced (08/01 0600) Resp:  [0-38] 14 (08/01 0715) BP: (95-144)/(46-100) 114/55 mmHg (08/01 0700) SpO2:  [89 %-99 %] 97 % (08/01 0715) Arterial Line BP: (65-166)/(51-98) 101/65 mmHg (08/01 0715) FiO2 (%):  [35 %-50 %] 35 % (08/01 0400) Weight:  [291 lb 14.2 oz (132.4 kg)] 291 lb 14.2 oz (132.4 kg) (08/01 0500)  Hemodynamic parameters for last 24 hours: PAP: (28-51)/(11-31) 35/19 mmHg CO:  [4.9 L/min-7.8 L/min] 7.8 L/min CI:  [2.1 L/min/m2-3.3 L/min/m2] 3.3 L/min/m2  Intake/Output from previous day: 07/31 0701 - 08/01 0700 In: 5701.5 [I.V.:4351.5; Blood:250; NG/GT:30; IV [id number]:0865] Out: 2545 [Urine:1515; Emesis/NG output:25; Blood:625; Chest Tube:380] Intake/Output this shift:    General appearance: alert, cooperative and no distress Neurologic: intact Heart: regular rate and rhythm Lungs: diminished breath sounds bibasilar Abdomen: normal findings: soft, non-tender  Lab Results:  Recent Labs  04/15/14 1800 04/15/14 1802 04/16/14 0454  WBC 14.2*  --  16.9*  HGB 11.1* 11.6* 10.2*  HCT 35.4* 34.0* 32.9*  PLT 171  --  195   BMET:  Recent Labs  04/15/14 0301  04/15/14 1802 04/16/14 0454  NA 142  < > 139 139  K 3.9  < > 4.4 4.2  CL 101  < > 104 105  CO2 29  --   --  24  GLUCOSE 109*  < > 156* 96  BUN 10  < > 7 10  CREATININE 1.14  < > 1.00 0.93  CALCIUM 8.6  --   --  8.2*  < > = values in this interval not displayed.  PT/INR:  Recent Labs  04/15/14 1253  LABPROT 15.4*  INR 1.22   ABG    Component Value  Date/Time   PHART 7.367 04/16/2014 0305   HCO3 24.8* 04/16/2014 0305   TCO2 26.2 04/16/2014 0305   ACIDBASEDEF 1.0 04/15/2014 2009   O2SAT 97.4 04/16/2014 0305   CBG (last 3)   Recent Labs  04/15/14 1707 04/15/14 1756 04/15/14 1850  GLUCAP 121* 148* 151*    Assessment/Plan: S/P Procedure(s) (LRB): CORONARY ARTERY BYPASS GRAFTING TIMES TWO USEING LEFT INTERNAL MAMMARY ARTERY AND RIGHT GREATER SAPHENOUS VEIN VIA ENDOVEIN HARVEST (N/A) INTRAOPERATIVE TRANSESOPHAGEAL ECHOCARDIOGRAM (N/A) POD # 1 Doing well CV- stable- dc swan, ASA, beta blocker  RESP - pulmonary hygiene, IS for bibasilar atelectasis  RENAL- creatinine normal lytes OK- volume overloaded- diurese  CBG well controlled- transition off insulin gtt  Anemia secondary to ABL- mild, follow  DVT prophylaxis- SCD + enoxaparin  DC CT  OOB, ambulate  LOS: 3 days    HENDRICKSON,STEVEN C 04/16/2014

## 2014-04-16 NOTE — Progress Notes (Signed)
Resting comfortably  BP 120/66  Pulse 87  Temp(Src) 97.4 F (36.3 C) (Oral)  Resp 17  Ht 5\' 7"  (1.702 m)  Wt 291 lb 14.2 oz (132.4 kg)  BMI 45.71 kg/m2  SpO2 98%   Intake/Output Summary (Last 24 hours) at 04/16/14 1735 Last data filed at 04/16/14 1700  Gross per 24 hour  Intake 3300.87 ml  Output   2025 ml  Net 1275.87 ml   K= 3.6, creatinine=0.8 Hct 33  Overall doing well Still + I/O- will give another dose of IV lasix this PM

## 2014-04-17 ENCOUNTER — Inpatient Hospital Stay (HOSPITAL_COMMUNITY): Payer: 59

## 2014-04-17 LAB — BASIC METABOLIC PANEL
ANION GAP: 11 (ref 5–15)
BUN: 11 mg/dL (ref 6–23)
CALCIUM: 8 mg/dL — AB (ref 8.4–10.5)
CO2: 28 mEq/L (ref 19–32)
Chloride: 100 mEq/L (ref 96–112)
Creatinine, Ser: 0.82 mg/dL (ref 0.50–1.35)
GFR, EST NON AFRICAN AMERICAN: 86 mL/min — AB (ref >90)
Glucose, Bld: 107 mg/dL — ABNORMAL HIGH (ref 70–99)
Potassium: 4.1 mEq/L (ref 3.7–5.3)
SODIUM: 139 meq/L (ref 137–147)

## 2014-04-17 LAB — GLUCOSE, CAPILLARY
GLUCOSE-CAPILLARY: 103 mg/dL — AB (ref 70–99)
GLUCOSE-CAPILLARY: 104 mg/dL — AB (ref 70–99)
GLUCOSE-CAPILLARY: 108 mg/dL — AB (ref 70–99)
GLUCOSE-CAPILLARY: 116 mg/dL — AB (ref 70–99)
GLUCOSE-CAPILLARY: 120 mg/dL — AB (ref 70–99)
GLUCOSE-CAPILLARY: 126 mg/dL — AB (ref 70–99)
GLUCOSE-CAPILLARY: 132 mg/dL — AB (ref 70–99)
GLUCOSE-CAPILLARY: 132 mg/dL — AB (ref 70–99)
Glucose-Capillary: 108 mg/dL — ABNORMAL HIGH (ref 70–99)
Glucose-Capillary: 108 mg/dL — ABNORMAL HIGH (ref 70–99)
Glucose-Capillary: 110 mg/dL — ABNORMAL HIGH (ref 70–99)
Glucose-Capillary: 111 mg/dL — ABNORMAL HIGH (ref 70–99)
Glucose-Capillary: 111 mg/dL — ABNORMAL HIGH (ref 70–99)
Glucose-Capillary: 111 mg/dL — ABNORMAL HIGH (ref 70–99)
Glucose-Capillary: 116 mg/dL — ABNORMAL HIGH (ref 70–99)
Glucose-Capillary: 126 mg/dL — ABNORMAL HIGH (ref 70–99)
Glucose-Capillary: 134 mg/dL — ABNORMAL HIGH (ref 70–99)
Glucose-Capillary: 151 mg/dL — ABNORMAL HIGH (ref 70–99)
Glucose-Capillary: 154 mg/dL — ABNORMAL HIGH (ref 70–99)

## 2014-04-17 LAB — CBC
HCT: 33.6 % — ABNORMAL LOW (ref 39.0–52.0)
Hemoglobin: 10.3 g/dL — ABNORMAL LOW (ref 13.0–17.0)
MCH: 26.5 pg (ref 26.0–34.0)
MCHC: 30.7 g/dL (ref 30.0–36.0)
MCV: 86.4 fL (ref 78.0–100.0)
PLATELETS: 205 10*3/uL (ref 150–400)
RBC: 3.89 MIL/uL — ABNORMAL LOW (ref 4.22–5.81)
RDW: 15.7 % — AB (ref 11.5–15.5)
WBC: 17.8 10*3/uL — ABNORMAL HIGH (ref 4.0–10.5)

## 2014-04-17 MED ORDER — SODIUM CHLORIDE 0.9 % IV SOLN: 250.0000 mL | INTRAVENOUS | Status: DC | PRN

## 2014-04-17 MED ORDER — FUROSEMIDE 10 MG/ML IJ SOLN
40.0000 mg | Freq: Every day | INTRAMUSCULAR | Status: DC
Start: 2014-04-17 — End: 2014-04-19
  Administered 2014-04-17 – 2014-04-19 (×3): 40 mg via INTRAVENOUS
  Filled 2014-04-17 (×3): qty 4

## 2014-04-17 MED ORDER — MOVING RIGHT ALONG BOOK
Freq: Once | Status: AC
  Administered 2014-04-17: 15:00:00
  Filled 2014-04-17: qty 1

## 2014-04-17 MED ORDER — INSULIN ASPART 100 UNIT/ML ~~LOC~~ SOLN: 0.0000 [IU] | Freq: Every day | SUBCUTANEOUS | Status: DC

## 2014-04-17 MED ORDER — ALUM & MAG HYDROXIDE-SIMETH 200-200-20 MG/5ML PO SUSP: 15.0000 mL | ORAL | Status: DC | PRN

## 2014-04-17 MED ORDER — LORATADINE 10 MG PO TABS
10.0000 mg | ORAL_TABLET | Freq: Every day | ORAL | Status: DC | PRN
  Filled 2014-04-17: qty 1

## 2014-04-17 MED ORDER — MAGNESIUM HYDROXIDE 400 MG/5ML PO SUSP: 30.0000 mL | Freq: Every day | ORAL | Status: DC | PRN

## 2014-04-17 MED ORDER — STUDY - INVESTIGATIONAL MEDICATION
1.0000 | Freq: Every evening | Status: DC
  Administered 2014-04-18: 1 via ORAL
  Filled 2014-04-17: qty 1

## 2014-04-17 MED ORDER — POTASSIUM CHLORIDE CRYS ER 20 MEQ PO TBCR
20.0000 meq | EXTENDED_RELEASE_TABLET | Freq: Two times a day (BID) | ORAL | Status: DC
  Administered 2014-04-17 – 2014-04-19 (×5): 20 meq via ORAL
  Filled 2014-04-17 (×6): qty 1

## 2014-04-17 MED ORDER — ATORVASTATIN CALCIUM 80 MG PO TABS
80.0000 mg | ORAL_TABLET | Freq: Every evening | ORAL | Status: DC
  Administered 2014-04-17 – 2014-04-18 (×2): 80 mg via ORAL
  Filled 2014-04-17 (×3): qty 1

## 2014-04-17 MED ORDER — FLUTICASONE PROPIONATE 50 MCG/ACT NA SUSP
2.0000 | Freq: Every day | NASAL | Status: DC | PRN
  Filled 2014-04-17: qty 16

## 2014-04-17 MED ORDER — ZOLPIDEM TARTRATE 5 MG PO TABS: 5.0000 mg | ORAL_TABLET | Freq: Every evening | ORAL | Status: DC | PRN

## 2014-04-17 MED ORDER — INSULIN ASPART 100 UNIT/ML ~~LOC~~ SOLN
0.0000 [IU] | Freq: Three times a day (TID) | SUBCUTANEOUS | Status: DC
  Administered 2014-04-17: 2 [IU] via SUBCUTANEOUS

## 2014-04-17 MED ORDER — SODIUM CHLORIDE 0.9 % IJ SOLN
3.0000 mL | Freq: Two times a day (BID) | INTRAMUSCULAR | Status: DC
  Administered 2014-04-17 – 2014-04-19 (×4): 3 mL via INTRAVENOUS

## 2014-04-17 MED ORDER — INSULIN DETEMIR 100 UNIT/ML ~~LOC~~ SOLN
20.0000 [IU] | Freq: Every day | SUBCUTANEOUS | Status: DC
  Administered 2014-04-17: 20 [IU] via SUBCUTANEOUS
  Filled 2014-04-17 (×2): qty 0.2

## 2014-04-17 MED ORDER — SODIUM CHLORIDE 0.9 % IJ SOLN: 3.0000 mL | INTRAMUSCULAR | Status: DC | PRN

## 2014-04-17 NOTE — Progress Notes (Signed)
Nursing note Patient ambulated from [facility] to room 2w22. This makes patients 2nd walk on day of transfer sitting on bed eating lunch call bell within reach, wife at bedside will monitor patinet. Shamica Moree, Bettina Gavia RN

## 2014-04-17 NOTE — Progress Notes (Signed)
2 Days Post-Op Procedure(s) (LRB): CORONARY ARTERY BYPASS GRAFTING TIMES TWO USEING LEFT INTERNAL MAMMARY ARTERY AND RIGHT GREATER SAPHENOUS VEIN VIA ENDOVEIN HARVEST (N/A) INTRAOPERATIVE TRANSESOPHAGEAL ECHOCARDIOGRAM (N/A) Subjective: C/o swelling and also incisional pain when he coughs Otherwise feels well  Objective: Vital signs in last 24 hours: Temp:  [97.4 F (36.3 C)-99.3 F (37.4 C)] 99.3 F (37.4 C) (08/02 0811) Pulse Rate:  [83-95] 84 (08/02 0600) Cardiac Rhythm:  [-] Normal sinus rhythm (08/02 0600) Resp:  [12-29] 15 (08/02 0600) BP: (111-141)/(47-77) 111/49 mmHg (08/02 0600) SpO2:  [97 %-100 %] 98 % (08/02 0600) Arterial Line BP: (55-204)/(47-60) 148/60 mmHg (08/01 1700) Weight:  [291 lb (131.997 kg)] 291 lb (131.997 kg) (08/02 0500)  Hemodynamic parameters for last 24 hours: PAP: (20-23)/(3-7) 20/4 mmHg  Intake/Output from previous day: 08/01 0701 - 08/02 0700 In: 2098.9 [P.O.:1080; I.V.:768.9; IV Piggyback:250] Out: 2175 [Urine:2045; Chest Tube:130] Intake/Output this shift:    General appearance: alert and no distress Neurologic: intact Heart: regular rate and rhythm Lungs: diminished breath sounds bibasilar Abdomen: normal findings: soft, non-tender  Lab Results:  Recent Labs  04/16/14 1650 04/17/14 0400  WBC 18.1* 17.8*  HGB 10.4* 10.3*  HCT 33.3* 33.6*  PLT 191 205   BMET:  Recent Labs  04/16/14 0454 04/16/14 1643 04/16/14 1650 04/17/14 0400  NA 139 140  --  139  K 4.2 3.6*  --  4.1  CL 105 101  --  100  CO2 24  --   --  28  GLUCOSE 96 145*  --  107*  BUN 10 8  --  11  CREATININE 0.93 0.90 0.80 0.82  CALCIUM 8.2*  --   --  8.0*    PT/INR:  Recent Labs  04/15/14 1253  LABPROT 15.4*  INR 1.22   ABG    Component Value Date/Time   PHART 7.367 04/16/2014 0305   HCO3 24.8* 04/16/2014 0305   TCO2 22 04/16/2014 1643   ACIDBASEDEF 1.0 04/15/2014 2009   O2SAT 97.4 04/16/2014 0305   CBG (last 3)   Recent Labs  04/16/14 2040  04/16/14 2349 04/17/14 0421  GLUCAP 146* 126* 103*    Assessment/Plan: S/P Procedure(s) (LRB): CORONARY ARTERY BYPASS GRAFTING TIMES TWO USEING LEFT INTERNAL MAMMARY ARTERY AND RIGHT GREATER SAPHENOUS VEIN VIA ENDOVEIN HARVEST (N/A) INTRAOPERATIVE TRANSESOPHAGEAL ECHOCARDIOGRAM (N/A) Plan for transfer to step-down: see transfer orders POD # 2 doing well  CV- stable in SR  RESP- LLL atelectasis on CXR, continue IS  RENAL- creatinine and lytes ok, volume overloaded- diurese  Dc foley  ENDO- CBG elevated but trending down, decrease levemir, continue SSI  Ambulation   LOS: 4 days    Jeffrey Wiggins C 04/17/2014

## 2014-04-18 ENCOUNTER — Encounter (HOSPITAL_COMMUNITY): Payer: Self-pay | Admitting: Thoracic Surgery (Cardiothoracic Vascular Surgery)

## 2014-04-18 ENCOUNTER — Inpatient Hospital Stay (HOSPITAL_COMMUNITY): Payer: 59

## 2014-04-18 LAB — GLUCOSE, CAPILLARY
GLUCOSE-CAPILLARY: 91 mg/dL (ref 70–99)
Glucose-Capillary: 96 mg/dL (ref 70–99)
Glucose-Capillary: 71 mg/dL (ref 70–99)
Glucose-Capillary: 89 mg/dL (ref 70–99)

## 2014-04-18 LAB — CBC
HCT: 34.9 % — ABNORMAL LOW (ref 39.0–52.0)
Hemoglobin: 10.7 g/dL — ABNORMAL LOW (ref 13.0–17.0)
MCH: 26.6 pg (ref 26.0–34.0)
MCHC: 30.7 g/dL (ref 30.0–36.0)
MCV: 86.8 fL (ref 78.0–100.0)
PLATELETS: 224 10*3/uL (ref 150–400)
RBC: 4.02 MIL/uL — AB (ref 4.22–5.81)
RDW: 15.8 % — AB (ref 11.5–15.5)
WBC: 16.7 10*3/uL — AB (ref 4.0–10.5)

## 2014-04-18 LAB — BASIC METABOLIC PANEL
Anion gap: 11 (ref 5–15)
BUN: 16 mg/dL (ref 6–23)
CALCIUM: 8.3 mg/dL — AB (ref 8.4–10.5)
CHLORIDE: 101 meq/L (ref 96–112)
CO2: 28 meq/L (ref 19–32)
CREATININE: 0.97 mg/dL (ref 0.50–1.35)
GFR calc non Af Amer: 81 mL/min — ABNORMAL LOW (ref >90)
Glucose, Bld: 96 mg/dL (ref 70–99)
Potassium: 4.5 mEq/L (ref 3.7–5.3)
Sodium: 140 mEq/L (ref 137–147)

## 2014-04-18 MED ORDER — LACTULOSE 10 GM/15ML PO SOLN
20.0000 g | Freq: Every day | ORAL | Status: DC | PRN
  Administered 2014-04-18: 20 g via ORAL
  Filled 2014-04-18: qty 30

## 2014-04-18 MED ORDER — LIVING WELL WITH DIABETES BOOK
Freq: Once | Status: AC
  Administered 2014-04-18: 10:00:00
  Filled 2014-04-18: qty 1

## 2014-04-18 MED ORDER — METFORMIN HCL 500 MG PO TABS
500.0000 mg | ORAL_TABLET | Freq: Two times a day (BID) | ORAL | Status: DC
  Administered 2014-04-18 – 2014-04-19 (×2): 500 mg via ORAL
  Filled 2014-04-18 (×4): qty 1

## 2014-04-18 NOTE — Discharge Summary (Signed)
Physician Discharge Summary  Patient ID: Jeffrey Wiggins MRN: [id number] DOB/AGE: 11-23-1940 73 y.o.  Admit date: 04/13/2014 Discharge date: 04/18/2014  Admission Diagnoses:  Patient Active Problem List   Diagnosis Date Noted  . CAD (coronary artery disease), native coronary artery 04/13/2014  . Acute diastolic congestive heart failure 04/01/2014  . 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 Severe obesity (BMI >= 40) 05/27/2013  . Postop Acute blood loss anemia 09/24/2012  . 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 Impaired fasting glucose 07/22/2012  . GERD (gastroesophageal reflux disease) 08/15/2011  . Allergic rhinitis, cause unspecified 08/15/2011  . SYNCOPE 01/19/2010  . COUGH 01/19/2010  . PURE HYPERCHOLESTEROLEMIA 04/24/2009  . CAROTID ARTERY STENOSIS 01/13/2009  . HYPERLIPIDEMIA 01/12/2009  . OBESITY 01/12/2009  . HYPERTENSION 01/12/2009  . MYOCARDIAL INFARCTION 01/12/2009  . CAD 01/12/2009  . BRADYCARDIA 01/12/2009  . DIVERTICULAR DISEASE 01/12/2009  . HEPATITIS 01/12/2009  . GALLSTONES 01/12/2009  . ARTHRITIS 01/12/2009  . Obstructive sleep apnea 01/12/2009  . PROSTATITIS, HX OF 01/12/2009  . BENIGN PROSTATIC HYPERTROPHY, HX OF 01/12/2009    Discharge Diagnoses:   Patient Active Problem List   Diagnosis Date Noted  . S/P CABG x 2 04/15/2014  . CAD (coronary artery disease), native coronary artery 04/13/2014  . Acute diastolic congestive heart failure 04/01/2014  . 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 Severe obesity (BMI >= 40) 05/27/2013  . Postop Acute blood loss anemia 09/24/2012  . 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 Impaired fasting glucose 07/22/2012  . GERD (gastroesophageal reflux disease) 08/15/2011  . Allergic rhinitis, cause unspecified 08/15/2011  . SYNCOPE 01/19/2010  . COUGH 01/19/2010  . PURE HYPERCHOLESTEROLEMIA 04/24/2009  . CAROTID ARTERY STENOSIS 01/13/2009  . HYPERLIPIDEMIA 01/12/2009  . OBESITY 01/12/2009  . HYPERTENSION  01/12/2009  . MYOCARDIAL INFARCTION 01/12/2009  . CAD 01/12/2009  . BRADYCARDIA 01/12/2009  . DIVERTICULAR DISEASE 01/12/2009  . HEPATITIS 01/12/2009  . GALLSTONES 01/12/2009  . ARTHRITIS 01/12/2009  . Obstructive sleep apnea 01/12/2009  . PROSTATITIS, HX OF 01/12/2009  . BENIGN PROSTATIC HYPERTROPHY, HX OF 01/12/2009   Discharged Condition: good  History of Present Illness:  Jeffrey Wiggins is a 73 yo morbidly obese white male with known history of coronary artery disease, hypertension, and hyperlipidemia.  His cardiac problems initially developed in 1998 when he suffered a Myocardial Infarction which was treated with PCI and stent placement.  He did well until 2009 at which time he again suffered an inferior wall myocardial infarction, which was again treated with PCI and stent placement to the RCA.  The patient again did well since that time.  However, over the past 6-8 weeks he began to experience progressive symptoms of exertional shortness of breath.  Initially symptoms occurred with strenuous exertion.  However, his symptoms further progressed and the patient developed exertional dyspnea with only mild activity.  He presented to Dr. Jacalyn Lefevre office for follow up 04/01/2014.  He underwent Echocardiogram which showed normal LV function.  He also underwent nuclear stress test which was abnormal showed high risk symptoms consistent with anterior wall ischemia.  It was felt due to his previous cardiac history he should undergo cardiac catheterization.  This was performed on 04/13/2014 which revealed 2 vessel CAD with LM involvement.  It was felt coronary bypass surgery would be the best treatment option.    Hospital Course:   The patient was admitted and TCTS consult was obtained.  He was evaluated by Dr. Roxy Manns who felt he would benefit from coronary bypass procedure.  The  risks and benefits of the procedure were explained to the patient and he was agreeable to proceed.  The patient remained chest  pain free during his hospitalization.  He was taken to the operating room on 04/15/2014 and underwent CABG x 2 utilizing LIMA to LAD and SVG to OM1.  He also underwent Endoscopic vein harvest from his right thigh.  He tolerated the procedure without difficulty and was taken to the SICU in stable condition.  He was extubated the evening of surgery.  During his stay in the SICU the patient has done well.  He was started on IV diuretic for hypervolemia.  His chest tubes and arterial lines were removed without difficulty.  His preoperative A1c was 6.5 and he was transitioned to insulin coverage.  He was maintaining NSR and ambulating around the SICU without difficulty.  He was felt medically stable for transfer to the step down unit.  The patient continues to progress.  His sugars have been running 90s-100s with insulin.  This will be discontinued and he will be started on low dose Metformin.  Diabetes education consult has also been placed.  He continues to maintain NSR and his pacing wires have been removed without difficulty.  He is tolerating a carb modified diet.  He continues to ambulate without difficulty.  Should no issues arise we anticipate discharge home in the next 24-48 hours.  Significant Diagnostic Studies: angiography:   HEMODYNAMICS: Aortic pressure 134/79 mmHg; LV pressure 134/29 mmHg; LVEDP 32 mm mercury; RA 11 mmHg; RV 153/14 mmHg; PA 145/16 mmHg; PCWP(mean) 19 mm mercury; Cardiac Output 7.37 L per minute; AV gradient: none   ANGIOGRAPHIC DATA: The left main coronary artery is heavily calcified and contains a distal 85-90% obstruction.  The left anterior descending artery is LAD is large and reaches the left ventricular apex. There is 50% stenosis in the mid vessel is heavily calcified segment prior to the origin of the first septal perforator and first diagonal. The first diagonal contains an 80% ostial stenosis..  The left circumflex artery is heavily calcified proximally. A large  branching first obtuse marginal is followed by the continuation of the circumflex before 2 small distal obtuse marginal branches arise. The circumflex just distal to the origin of the first obtuse marginal contains segmental 50% narrowing..  The right coronary artery is dominant. A stent sandwich is present in the mid segment. There is 50% narrowing near the origin of the acute marginal branch which represents an in-stent restenotic segment. No critical obstruction is noted in the right coronary.  LEFT VENTRICULOGRAM: Left ventricular angiogram was done in the 30 RAO projection and revealed overall normal function with an ejection fraction of approximately 50%.  IMPRESSIONS: 1. Heavily calcified and critically obstructed distal left main 85-90% stenosis.  2. Moderate in-stent restenosis in the mid RCA, 50%  3. 30-50% proximal to mid LAD with a 70-80% ostial diagonal to  4. Overall normal to low normal LV systolic function with evidence of diastolic failure (LVEDP 32 mm mercury)  Treatments: surgery:   Coronary Artery Bypass Grafting x 2  Left Internal Mammary Artery to Distal Left Anterior Descending Coronary Artery  Saphenous Vein Graft to First Obtuse Marginal Branch of Left Circumflex Coronary Artery  Endoscopic Vein Harvest from Right Thigh  Disposition: 06-Home-Health Care Svc  Discharge Medications:     Medication List    STOP taking these medications       losartan 100 MG tablet  Commonly known as:  COZAAR  nitroGLYCERIN 0.4 MG SL tablet  Commonly known as:  NITROSTAT      TAKE these medications       acetaminophen 500 MG tablet  Commonly known as:  TYLENOL  Take 1,000 mg by mouth at bedtime as needed for mild pain.     aspirin 325 MG EC tablet  Take 1 tablet (325 mg total) by mouth daily.     atorvastatin 80 MG tablet  Commonly known as:  LIPITOR  Take 80 mg by mouth every evening.     CALCIUM-MAGNESIUM-ZINC PO  Take 1 each by mouth daily.     co-enzyme Q-10  30 MG capsule  Take 30 mg by mouth 3 (three) times daily.     fluticasone 50 MCG/ACT nasal spray  Commonly known as:  FLONASE  Place 2 sprays into the nose daily.     furosemide 20 MG tablet  Commonly known as:  LASIX  Take 1 tablet (20 mg total) by mouth daily.     glucose blood test strip  Use as instructed     glucose monitoring kit monitoring kit  1 each by Does not apply route as needed for other.     LANCETS ULTRA THIN 30G Misc  1 Units by Does not apply route 4 (four) times daily -  before meals and at bedtime.     loratadine 10 MG tablet  Commonly known as:  CLARITIN  Take 10 mg by mouth daily.     metFORMIN 500 MG tablet  Commonly known as:  GLUCOPHAGE  Take 1 tablet (500 mg total) by mouth 2 (two) times daily with a meal.     metoprolol tartrate 25 MG tablet  Commonly known as:  LOPRESSOR  Take 1 tablet (25 mg total) by mouth 2 (two) times daily.     omeprazole 20 MG capsule  Commonly known as:  PRILOSEC  Take 20 mg by mouth every other day.     oxyCODONE 5 MG immediate release tablet  Commonly known as:  Oxy IR/ROXICODONE  Take 1-2 tablets (5-10 mg total) by mouth every 4 (four) hours as needed for moderate pain.     potassium chloride 10 MEQ tablet  Commonly known as:  K-DUR,KLOR-CON  Take 1 tablet (10 mEq total) by mouth once.     STUDY MEDICATION  Take 1 tablet by mouth every evening. [employer] for cholesterol. Study medication Anacetrapib or placebo     testosterone cypionate 200 MG/ML injection  Commonly known as:  DEPOTESTOTERONE CYPIONATE  Inject 200 mg into the muscle every 14 (fourteen) days.     Vitamin D 2000 UNITS tablet  Take 2,000 Units by mouth daily.          The patient has been discharged on:   1.Beta Blocker:  Yes [ x  ]                              No   [   ]                              If No, reason:  2.Ace Inhibitor/ARB: Yes [   ]                                     No  [  x  ]                                      If No, reason: Labile blood pressure  3.Statin:   Yes [x   ]                  No  [   ]                  If No, reason:  4.Ecasa:  Yes  [  x ]                  No   [   ]                  If No, reason:      Signed: Delila Kuklinski 04/18/2014, 9:07 AM

## 2014-04-18 NOTE — Progress Notes (Addendum)
      [id number]       [place] [address]             [phone]      3 Days Post-Op Procedure(s) (LRB): CORONARY ARTERY BYPASS GRAFTING TIMES TWO USEING LEFT INTERNAL MAMMARY ARTERY AND RIGHT GREATER SAPHENOUS VEIN VIA ENDOVEIN HARVEST (N/A) INTRAOPERATIVE TRANSESOPHAGEAL ECHOCARDIOGRAM (N/A)  Subjective:  Jeffrey Wiggins states he is doing well.  He does have some pain with coughing, which is to be expected.  He is ambulating without difficulty.  No BM  Objective: Vital signs in last 24 hours: Temp:  [98.7 F (37.1 C)-99.9 F (37.7 C)] 99 F (37.2 C) (08/03 0413) Pulse Rate:  [90-97] 91 (08/03 0413) Cardiac Rhythm:  [-] Normal sinus rhythm (08/03 0722) Resp:  [13-18] 18 (08/03 0413) BP: (99-136)/(54-79) 136/79 mmHg (08/03 0413) SpO2:  [92 %-98 %] 93 % (08/03 0413) Weight:  [290 lb (131.543 kg)] 290 lb (131.543 kg) (08/03 0500)  Intake/Output from previous day: 08/02 0701 - 08/03 0700 In: 540 [P.O.:480; I.V.:60] Out: 895 [Urine:895] Intake/Output this shift: Total I/O In: -  Out: 300 [Urine:300]  General appearance: alert, cooperative and no distress Heart: regular rate and rhythm Lungs: clear to auscultation bilaterally Abdomen: soft, non-tender; bowel sounds normal; no masses,  no organomegaly Extremities: edema trace Wound: clean and dry  Lab Results:  Recent Labs  04/17/14 0400 04/18/14 0450  WBC 17.8* 16.7*  HGB 10.3* 10.7*  HCT 33.6* 34.9*  PLT 205 224   BMET:  Recent Labs  04/17/14 0400 04/18/14 0450  NA 139 140  K 4.1 4.5  CL 100 101  CO2 28 28  GLUCOSE 107* 96  BUN 11 16  CREATININE 0.82 0.97  CALCIUM 8.0* 8.3*    PT/INR:  Recent Labs  04/15/14 1253  LABPROT 15.4*  INR 1.22   ABG    Component Value Date/Time   PHART 7.367 04/16/2014 0305   HCO3 24.8* 04/16/2014 0305   TCO2 22 04/16/2014 1643   ACIDBASEDEF 1.0 04/15/2014 2009   O2SAT 97.4 04/16/2014 0305   CBG (last 3)   Recent Labs  04/17/14 1619 04/17/14 2127  04/18/14 0629  GLUCAP 108* 108* 96    Assessment/Plan: S/P Procedure(s) (LRB): CORONARY ARTERY BYPASS GRAFTING TIMES TWO USEING LEFT INTERNAL MAMMARY ARTERY AND RIGHT GREATER SAPHENOUS VEIN VIA ENDOVEIN HARVEST (N/A) INTRAOPERATIVE TRANSESOPHAGEAL ECHOCARDIOGRAM (N/A)  1. CV-NSR rate and pressure control- continue Lopressor 2. Pulm- no acute issues, off oxygen, some residual atelectasis/effusion- continue IS 3. Renal- creatinine WNL, mild hypervolemia weight is elevated 5 lbs above admission, continue Lasix for now 4. DM- preop A1c is at 6.5, sugars have been on the low side, will d/c insulin and start low dose Metformin, order diabetes education 5. Dispo- patient doing well, will d/c EPW today, start Metformin, order diabetes education consult   LOS: 5 days    BARRETT, Jeffrey Wiggins  I have seen and examined the patient and agree with the assessment and plan as outlined.  OWEN,Jeffrey Wiggins 9:06 AM

## 2014-04-18 NOTE — Progress Notes (Signed)
CARDIAC REHAB PHASE I   PRE:  Rate/Rhythm: 96 SR  BP:  Supine:   Sitting: 117/56  Standing:    SaO2: 96%RA  MODE:  Ambulation: 550 ft   POST:  Rate/Rhythm: 118 ST  BP:  Supine:   Sitting: 141/59  Standing:    SaO2: 96%RA [id number] Pt walked 550 ft on RA with rolling walker and asst x 1 with fairly steady gait. Stated he has rolling walker at home. Needs assistance to stand, rocking. Some DOE during walk but sats good. To recliner with call bell.   Graylon Good, RN BSN  04/18/2014 9:41 AM

## 2014-04-18 NOTE — Progress Notes (Signed)
Pt ambulated in hallway x1 assist 800 feet with walker. Gait steady. Pt back in room call bell with in reach. Will monitor patient. Azaiah Licciardi, Bettina Gavia RN

## 2014-04-18 NOTE — Progress Notes (Signed)
Pt EPW pulled per protocol and as ordered. Pt tolerated well all ends intact. Pt reminded to lie supine approximately one hour. Will monitor patient  bp 114/59. Emeterio Balke, Bettina Gavia RN

## 2014-04-18 NOTE — Progress Notes (Signed)
Nursing note  patient given diabetes booklet and educated patient and wife on education videos, verbalized understanding and improtance to watch videos . Will continue to encourage. Fayne Mcguffee, Bettina Gavia RN

## 2014-04-18 NOTE — Progress Notes (Signed)
Patient ambulated in hallway x 1 assist with walker 500 feet . back in chair call bell with in reach will monitor patient. Brealynn Contino, Bettina Gavia RN

## 2014-04-18 NOTE — Progress Notes (Signed)
Inpatient Diabetes Program Recommendations  AACE/ADA: New Consensus Statement on Inpatient Glycemic Control (2013)  Target Ranges:  Prepandial:   less than 140 mg/dL      Peak postprandial:   less than 180 mg/dL (1-2 hours)      Critically ill patients:  140 - 180 mg/dL   Inpatient Diabetes Program Recommendations HgbA1C: =6.5  Outpatient Referral: order placed for [facility]  This coordinator spoke with patient and his wife concerning A1C=6.5.  Patient's wife states that she has diabetes and has been to the OP classes.  Patient is unsure he has diabetes so we discussed the current ADA and AACE guidelines for A1C.  Pt states he will be interested in DM classes once he has recovered from his surgery.  [facility] been placed in [facility].  Basic inpatient education has been initiated via the DM videos on the patient ed network and the Living Well with Diabetes patient ed workbook.  No further questions/concerns at the end of our visit.  Thank you  Raoul Pitch BSN, RN,CDE Inpatient Diabetes Coordinator [phone] (team pager)

## 2014-04-19 ENCOUNTER — Telehealth: Payer: Self-pay

## 2014-04-19 LAB — GLUCOSE, CAPILLARY: GLUCOSE-CAPILLARY: 90 mg/dL (ref 70–99)

## 2014-04-19 MED ORDER — GLUCOSE BLOOD VI STRP: ORAL_STRIP | Status: DC

## 2014-04-19 MED ORDER — LANCETS ULTRA THIN 30G MISC: 1.0000 [IU] | Freq: Three times a day (TID) | Status: DC

## 2014-04-19 MED ORDER — POTASSIUM CHLORIDE CRYS ER 10 MEQ PO TBCR: 10.0000 meq | EXTENDED_RELEASE_TABLET | Freq: Once | ORAL | Status: DC

## 2014-04-19 MED ORDER — METFORMIN HCL 500 MG PO TABS: 500.0000 mg | ORAL_TABLET | Freq: Two times a day (BID) | ORAL | Status: DC

## 2014-04-19 MED ORDER — ASPIRIN 325 MG PO TBEC: 325.0000 mg | DELAYED_RELEASE_TABLET | Freq: Every day | ORAL | Status: DC

## 2014-04-19 MED ORDER — FREESTYLE SYSTEM KIT: 1.0000 | PACK | Status: DC | PRN

## 2014-04-19 MED ORDER — OXYCODONE HCL 5 MG PO TABS: 5.0000 mg | ORAL_TABLET | ORAL | Status: DC | PRN

## 2014-04-19 MED ORDER — METOPROLOL TARTRATE 25 MG PO TABS: 25.0000 mg | ORAL_TABLET | Freq: Two times a day (BID) | ORAL | Status: DC

## 2014-04-19 MED FILL — Potassium Chloride Inj 2 mEq/ML: INTRAVENOUS | Qty: 40 | Status: AC

## 2014-04-19 MED FILL — Magnesium Sulfate Inj 50%: INTRAMUSCULAR | Qty: 10 | Status: AC

## 2014-04-19 MED FILL — Heparin Sodium (Porcine) Inj 1000 Unit/ML: INTRAMUSCULAR | Qty: 30 | Status: AC

## 2014-04-19 NOTE — Progress Notes (Addendum)
      [address]       ,[place] [address]             [phone]      4 Days Post-Op Procedure(s) (LRB): CORONARY ARTERY BYPASS GRAFTING TIMES TWO USEING LEFT INTERNAL MAMMARY ARTERY AND RIGHT GREATER SAPHENOUS VEIN VIA ENDOVEIN HARVEST (N/A) INTRAOPERATIVE TRANSESOPHAGEAL ECHOCARDIOGRAM (N/A)  Subjective:  Jeffrey Wiggins has no complaints this morning.  He does question the need for DM medication.  He states his sugars have been in the 80s with use here.  I explained to the patient that he is on monitored diabetic diet here.  When questioned regarding diet at home patient states he eats what he wants.  I explained to the patient his sugars will not remain in the 80s if he is not following a diabetic diet. + BM  Objective: Vital signs in last 24 hours: Temp:  [98 F (36.7 C)-99 F (37.2 C)] 98 F (36.7 C) (08/04 0549) Pulse Rate:  [92-102] 96 (08/04 0549) Cardiac Rhythm:  [-] Normal sinus rhythm (08/03 1954) Resp:  [18] 18 (08/04 0549) BP: (105-135)/(48-67) 128/67 mmHg (08/04 0549) SpO2:  [95 %-97 %] 97 % (08/04 0549) Weight:  [286 lb 9.6 oz (130 kg)] 286 lb 9.6 oz (130 kg) (08/04 0549)  Intake/Output from previous day: 08/03 0701 - 08/04 0700 In: 720 [P.O.:720] Out: 1950 [Urine:1950]  General appearance: alert, cooperative and no distress Heart: regular rate and rhythm Lungs: clear to auscultation bilaterally Abdomen: soft, non-tender; bowel sounds normal; no masses,  no organomegaly Extremities: edema trace Wound: clean and dry  Lab Results:  Recent Labs  04/17/14 0400 04/18/14 0450  WBC 17.8* 16.7*  HGB 10.3* 10.7*  HCT 33.6* 34.9*  PLT 205 224   BMET:  Recent Labs  04/17/14 0400 04/18/14 0450  NA 139 140  K 4.1 4.5  CL 100 101  CO2 28 28  GLUCOSE 107* 96  BUN 11 16  CREATININE 0.82 0.97  CALCIUM 8.0* 8.3*    PT/INR: No results found for this basename: LABPROT, INR,  in the last 72 hours ABG    Component Value Date/Time   PHART  7.367 04/16/2014 0305   HCO3 24.8* 04/16/2014 0305   TCO2 22 04/16/2014 1643   ACIDBASEDEF 1.0 04/15/2014 2009   O2SAT 97.4 04/16/2014 0305   CBG (last 3)   Recent Labs  04/18/14 1627 04/18/14 2142 04/19/14 0611  GLUCAP 89 91 90    Assessment/Plan: S/P Procedure(s) (LRB): CORONARY ARTERY BYPASS GRAFTING TIMES TWO USEING LEFT INTERNAL MAMMARY ARTERY AND RIGHT GREATER SAPHENOUS VEIN VIA ENDOVEIN HARVEST (N/A) INTRAOPERATIVE TRANSESOPHAGEAL ECHOCARDIOGRAM (N/A)  1. CV- NSR, rate and pressure controlled- continue Lopressor 2. Pulm- no acute issues, encouraged continued IS at discharge 3. Renal- volume status is at baseline, received IV lasix yesterday, will give 3 day course of PO then discontinue 4. DM- preop A1c of 6.5, will continue Metformin 500 mg BID, Diabetes education completed, patient instructed to follow up with PCP 5. Dispo- patient stable, will d/c home today   LOS: 6 days    Jeffrey Wiggins, Jeffrey Wiggins 04/19/2014  I have seen and examined the patient and agree with the assessment and plan as outlined.  Jeffrey Wiggins,Jeffrey Wiggins 04/19/2014 7:51 AM

## 2014-04-19 NOTE — Progress Notes (Signed)
Chest tube sutures removed per order and protocol, steri-strips applied, will continue to monitor. Pt educated on incision care. Rickard Rhymes, RN

## 2014-04-19 NOTE — Progress Notes (Signed)
0820-0900 Education completed with pt and wife who voiced understanding. Discussed CRP 2 and pt gave permission to refer to [place] Phase 2. Gave copy of heart healthy and diabetic diets. Discussed carb counting since HGBA1C at 6.5. Offered post op video but pt did not want to watch at this time. Has rolling walker for home use. Graylon Good RN BSN 04/19/2014 9:03 AM

## 2014-04-19 NOTE — Progress Notes (Signed)
Discharge education, medication, follow-up appts, and prescriptions reviewed with pt and wife, both stated understanding, cardiac rehab provided discharge education, education reinforced, IV and tele removed, pt getting dressed Rickard Rhymes, RN

## 2014-04-19 NOTE — Care Management Note (Signed)
    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   (If response is "NO", the following Medicare IM given date fields will be blank) Date Medicare IM given:   Medicare IM given by:   Date Additional Medicare IM given:   Additional Medicare IM given by:    Discharge Disposition:  HOME/SELF CARE  Per UR Regulation:  Reviewed for med. necessity/level of care/duration of stay  If discussed at [place] of Stay Meetings, dates discussed:   04/19/2014    Comments:  ContactArsenio, [place] Spouse [place] [phone]  04/19/14 Ellan Lambert, RN, BSN [phone] Pt for dc home today with wife.  Denies any needs for home.  04/15/14 [place] MSN BSN CCM Spouse states she is working on providing 24/7 assistance for pt when medically ready for discharge.

## 2014-04-19 NOTE — Discharge Instructions (Signed)
Coronary Artery Bypass Grafting, Care After °Refer to this sheet in the next few weeks. These instructions provide you with information on caring for yourself after your procedure. Your health care provider may also give you more specific instructions. Your treatment has been planned according to current medical practices, but problems sometimes occur. Call your health care provider if you have any problems or questions after your procedure. °WHAT TO EXPECT AFTER THE PROCEDURE °Recovery from surgery will be different for everyone. Some people feel well after 3 or 4 weeks, while for others it takes longer. After your procedure, it is typical to have the following: °· Nausea and a lack of appetite.   °· Constipation. °· Weakness and fatigue.   °· Depression or irritability.   °· Pain or discomfort at your incision site. °HOME CARE INSTRUCTIONS °· Take medicines only as directed by your health care provider. Do not stop taking medicines or start any new medicines without first checking with your health care provider. °· Take your pulse as directed by your health care provider. °· Perform deep breathing as directed by your health care provider. If you were given a device called an incentive spirometer, use it to practice deep breathing several times a day. Support your chest with a pillow or your arms when you take deep breaths or cough. °· Keep incision areas clean, dry, and protected. Remove or change any bandages (dressings) only as directed by your health care provider. You may have skin adhesive strips over the incision areas. Do not take the strips off. They will fall off on their own. °· Check incision areas daily for any swelling, redness, or drainage. °· If incisions were made in your legs, do the following: °¨ Avoid crossing your legs.   °¨ Avoid sitting for long periods of time. Change positions every 30 minutes.   °¨ Elevate your legs when you are sitting. °· Wear compression stockings as directed by your  health care provider. These stockings help keep blood clots from forming in your legs. °· Take showers once your health care provider approves. Until then, only take sponge baths. Pat incisions dry. Do not rub incisions with a washcloth or towel. Do not take baths, swim, or use a hot tub until your health care provider approves. °· Eat foods that are high in fiber, such as raw fruits and vegetables, whole grains, beans, and nuts. Meats should be lean cut. Avoid canned, processed, and fried foods. °· Drink enough fluid to keep your urine clear or pale yellow. °· Weigh yourself every day. This helps identify if you are retaining fluid that may make your heart and lungs work harder. °· Rest and limit activity as directed by your health care provider. You may be instructed to: °¨ Stop any activity at once if you have chest pain, shortness of breath, irregular heartbeats, or dizziness. Get help right away if you have any of these symptoms. °¨ Move around frequently for short periods or take short walks as directed by your health care provider. Increase your activities gradually. You may need physical therapy or cardiac rehabilitation to help strengthen your muscles and build your endurance. °¨ Avoid lifting, pushing, or pulling anything heavier than 10 lb (4.5 kg) for at least 6 weeks after surgery. °· Do not drive until your health care provider approves.  °· Ask your health care provider when you may return to work. °· Ask your health care provider when you may resume sexual activity. °· Keep all follow-up visits as directed by your health care   provider. This is important. °SEEK MEDICAL CARE IF: °· You have swelling, redness, increasing pain, or drainage at the site of an incision. °· You have a fever. °· You have swelling in your ankles or legs. °· You have pain in your legs.   °· You gain 2 or more pounds (0.9 kg) a day. °· You are nauseous or vomit. °· You have diarrhea.  °SEEK IMMEDIATE MEDICAL CARE IF: °· You have  chest pain that goes to your jaw or arms. °· You have shortness of breath.   °· You have a fast or irregular heartbeat.   °· You notice a "clicking" in your breastbone (sternum) when you move.   °· You have numbness or weakness in your arms or legs. °· You feel dizzy or light-headed.   °MAKE SURE YOU: °· Understand these instructions. °· Will watch your condition. °· Will get help right away if you are not doing well or get worse. °Document Released: 03/22/2005 Document Revised: 01/17/2014 Document Reviewed: 02/09/2013 °[employer]® Patient Information ©2015 [employer]. This information is not intended to replace advice given to you by your health care provider. Make sure you discuss any questions you have with your health care provider. ° °Endoscopic Saphenous Vein Harvesting °Care After °Refer to this sheet in the next few weeks. These instructions provide you with information on caring for yourself after your procedure. Your health care provider may also give you more specific instructions. Your treatment has been planned according to current medical practices, but problems sometimes occur. Call your health care provider if you have any problems or questions after your procedure. °HOME CARE INSTRUCTIONS °Medicine °· Take whatever pain medicine your surgeon prescribes. Follow the directions carefully. Do not take over-the-counter pain medicine unless your surgeon says it is okay. Some pain medicine can cause bleeding problems for several weeks after surgery. °· Follow your surgeon's instructions about driving. You will probably not be permitted to drive after heart surgery. °· Take any medicines your surgeon prescribes. Any medicines you took before your heart surgery should be checked with your health care provider before you start taking them again. °Wound care °· If your surgeon has prescribed an elastic bandage or stocking, ask how long you should wear it. °· Check the area around your surgical cuts  (incisions) whenever your bandages (dressings) are changed. Look for any redness or swelling. °· You will need to return to have the stitches (sutures) or staples taken out. Ask your surgeon when to do that. °· Ask your surgeon when you can shower or bathe. °Activity °· Try to keep your legs raised when you are sitting. °· Do any exercises your health care providers have given you. These may include deep breathing exercises, coughing, walking, or other exercises. °SEEK MEDICAL CARE IF: °· You have any questions about your medicines. °· You have more leg pain, especially if your pain medicine stops working. °· New or growing bruises develop on your leg. °· Your leg swells, feels tight, or becomes red. °· You have numbness in your leg. °SEEK IMMEDIATE MEDICAL CARE IF: °· Your pain gets much worse. °· Blood or fluid leaks from any of the incisions. °· Your incisions become warm, swollen, or red. °· You have chest pain. °· You have trouble breathing. °· You have a fever. °· You have more pain near your leg incision. °MAKE SURE YOU: °· Understand these instructions. °· Will watch your condition. °· Will get help right away if you are not doing well or get worse. °Document Released: 05/15/2011   Document Revised: 09/07/2013 Document Reviewed: 05/15/2011 [facility] Patient Information 2015 [place], [place]. This information is not intended to replace advice given to you by your health care provider. Make sure you discuss any questions you have with your health care provider.  Diabetes Mellitus and Food It is important for you to manage your blood sugar (glucose) level. Your blood glucose level can be greatly affected by what you eat. Eating healthier foods in the appropriate amounts throughout the day at about the same time each day will help you control your blood glucose level. It can also help slow or prevent worsening of your diabetes mellitus. Healthy eating may even help you improve the level of your blood pressure  and reach or maintain a healthy weight.  HOW CAN FOOD AFFECT ME? Carbohydrates Carbohydrates affect your blood glucose level more than any other type of food. Your dietitian will help you determine how many carbohydrates to eat at each meal and teach you how to count carbohydrates. Counting carbohydrates is important to keep your blood glucose at a healthy level, especially if you are using insulin or taking certain medicines for diabetes mellitus. Alcohol Alcohol can cause sudden decreases in blood glucose (hypoglycemia), especially if you use insulin or take certain medicines for diabetes mellitus. Hypoglycemia can be a life-threatening condition. Symptoms of hypoglycemia (sleepiness, dizziness, and disorientation) are similar to symptoms of having too much alcohol.  If your health care provider has given you approval to drink alcohol, do so in moderation and use the following guidelines:  Women should not have more than one drink per day, and men should not have more than two drinks per day. One drink is equal to:  12 oz of beer.  5 oz of wine.  1 oz of hard liquor.  Do not drink on an empty stomach.  Keep yourself hydrated. Have water, diet soda, or unsweetened iced tea.  Regular soda, juice, and other mixers might contain a lot of carbohydrates and should be counted. WHAT FOODS ARE NOT RECOMMENDED? As you make food choices, it is important to remember that all foods are not the same. Some foods have fewer nutrients per serving than other foods, even though they might have the same number of calories or carbohydrates. It is difficult to get your body what it needs when you eat foods with fewer nutrients. Examples of foods that you should avoid that are high in calories and carbohydrates but low in nutrients include:  Trans fats (most processed foods list trans fats on the Nutrition Facts label).  Regular soda.  Juice.  Candy.  Sweets, such as cake, pie, doughnuts, and  cookies.  Fried foods. WHAT FOODS CAN I EAT? Have nutrient-rich foods, which will nourish your body and keep you healthy. The food you should eat also will depend on several factors, including:  The calories you need.  The medicines you take.  Your weight.  Your blood glucose level.  Your blood pressure level.  Your cholesterol level. You also should eat a variety of foods, including:  Protein, such as meat, poultry, fish, tofu, nuts, and seeds (lean animal proteins are best).  Fruits.  Vegetables.  Dairy products, such as milk, cheese, and yogurt (low fat is best).  Breads, grains, pasta, cereal, rice, and beans.  Fats such as olive oil, trans fat-free margarine, canola oil, avocado, and olives. DOES EVERYONE WITH DIABETES MELLITUS HAVE THE SAME MEAL PLAN? Because every person with diabetes mellitus is different, there is not one meal plan that  works for everyone. It is very important that you meet with a dietitian who will help you create a meal plan that is just right for you. Document Released: 05/30/2005 Document Revised: 09/07/2013 Document Reviewed: 07/30/2013 [facility] Patient Information 2015 [place], [place]. This information is not intended to replace advice given to you by your health care provider. Make sure you discuss any questions you have with your health care provider.  1. Please keep a diary of sugars to provide to PCP

## 2014-04-27 ENCOUNTER — Telehealth: Payer: Self-pay | Admitting: Cardiology

## 2014-04-27 MED ORDER — OXYCODONE HCL 5 MG PO TABS: 5.0000 mg | ORAL_TABLET | ORAL | Status: DC | PRN

## 2014-04-27 NOTE — Telephone Encounter (Signed)
Refill Request:   Pt is requesting a refill of his oxyCODONE (OXY IR/ROXICODONE) 5 MG immediate release tablet sent to [facility] in [place]. Please call.

## 2014-04-27 NOTE — Telephone Encounter (Signed)
Spoke with pt, aware he has to have a hard copy prescription to take to the pharm. Will have at the front desk tomorrow for pt to pick up.

## 2014-04-30 NOTE — Addendum Note (Signed)
Addendum created 04/30/14 1713 by Laurie Panda, MD   Modules edited: Anesthesia Attestations, Anesthesia Events

## 2014-05-10 ENCOUNTER — Telehealth: Payer: Self-pay | Admitting: *Deleted

## 2014-05-10 ENCOUNTER — Ambulatory Visit (INDEPENDENT_AMBULATORY_CARE_PROVIDER_SITE_OTHER): Payer: 59 | Admitting: Cardiology

## 2014-05-10 ENCOUNTER — Encounter: Payer: Self-pay | Admitting: Cardiology

## 2014-05-10 VITALS — BP 112/64 | HR 87 | Ht 67.0 in | Wt 259.0 lb

## 2014-05-10 DIAGNOSIS — I10 Essential (primary) hypertension: Secondary | ICD-10-CM

## 2014-05-10 DIAGNOSIS — E119 Type 2 diabetes mellitus without complications: Secondary | ICD-10-CM

## 2014-05-10 DIAGNOSIS — R059 Cough, unspecified: Secondary | ICD-10-CM

## 2014-05-10 DIAGNOSIS — E785 Hyperlipidemia, unspecified: Secondary | ICD-10-CM

## 2014-05-10 DIAGNOSIS — R05 Cough: Secondary | ICD-10-CM

## 2014-05-10 DIAGNOSIS — Z951 Presence of aortocoronary bypass graft: Secondary | ICD-10-CM

## 2014-05-10 MED ORDER — FLUTICASONE PROPIONATE (INHAL) 50 MCG/BLIST IN AEPB: 1.0000 | INHALATION_SPRAY | Freq: Two times a day (BID) | RESPIRATORY_TRACT | Status: DC

## 2014-05-10 NOTE — Progress Notes (Signed)
05/12/2014   PCP: [name], MD   Chief Complaint  Patient presents with  . Follow-up    S/P hospital visit    Primary Cardiologist: Dr. Thresa Ross   HPI:  Patient is here for followup status post bypass grafting on 04/15/2014. 73 yo morbidly obese white male with known history of coronary artery disease, hypertension, and hyperlipidemia. His cardiac problems initially developed in 1998 when he suffered a Myocardial Infarction which was treated with PCI and stent placement. He did well until 2009 at which time he again suffered an inferior wall myocardial infarction, which was again treated with PCI and stent placement to the RCA. The patient again did well since that time. On 04/01/2014 he had been having exertional shortness of breath progressed and there was normal LV function nuclear stress test was abnormal consistent with anterior wall ischemia he underwent cardiac catheterization revealing two-vessel disease with left main involvement.    He underwent bypass grafting x2 with LIMA to the LAD and vein graft to the first obtuse marginal branch of the left circumflex.  He maintained sinus rhythm and was discharged 04/18/2014.    Today he tells me his diet has been. Good he's walking and he has no edema. He does complain of a chronic cough he does get some productive mucus at times thick brownish in color. Has a history of allergies. Coughing is greater at night he should use inhalers in the past.  His appetite is good and he is on better diet than he has had in the past.  His glucose is well controlled 93-100.  No chest pain or shortness of breath mild chest wall pain.   No Known Allergies  Current Outpatient Prescriptions  Medication Sig Dispense Refill  . acetaminophen (TYLENOL) 500 MG tablet Take 1,000 mg by mouth at bedtime as needed for mild pain.       Marland Kitchen aspirin EC 325 MG EC tablet Take 1 tablet (325 mg total) by mouth daily.  30 tablet  0  . atorvastatin  (LIPITOR) 80 MG tablet Take 80 mg by mouth every evening.      Marland Kitchen CALCIUM-MAGNESIUM-ZINC PO Take 1 each by mouth daily.      . Cholecalciferol (VITAMIN D) 2000 UNITS tablet Take 2,000 Units by mouth daily.      Marland Kitchen co-enzyme Q-10 30 MG capsule Take 30 mg by mouth 3 (three) times daily.      . fluticasone (FLONASE) 50 MCG/ACT nasal spray Place 2 sprays into the nose daily.  48 g  1  . furosemide (LASIX) 20 MG tablet Take 1 tablet (20 mg total) by mouth daily.  90 tablet  3  . glucose blood test strip Use as instructed  100 each  12  . glucose monitoring kit (FREESTYLE) monitoring kit 1 each by Does not apply route as needed for other.  1 each  0  . LANCETS ULTRA THIN 30G MISC 1 Units by Does not apply route 4 (four) times daily -  before meals and at bedtime.  100 each  12  . loratadine (CLARITIN) 10 MG tablet Take 10 mg by mouth daily.       . metFORMIN (GLUCOPHAGE) 500 MG tablet Take 1 tablet (500 mg total) by mouth 2 (two) times daily with a meal.  60 tablet  3  . metoprolol tartrate (LOPRESSOR) 25 MG tablet Take 1 tablet (25 mg total) by mouth 2 (two) times daily.  [place]  tablet  3  . omeprazole (PRILOSEC) 20 MG capsule Take 20 mg by mouth every other day.       . oxyCODONE (OXY IR/ROXICODONE) 5 MG immediate release tablet Take 1-2 tablets (5-10 mg total) by mouth every 4 (four) hours as needed for moderate pain.  30 tablet  0  . potassium chloride SA (K-DUR,KLOR-CON) 10 MEQ tablet Take 1 tablet (10 mEq total) by mouth once.  30 tablet  3  . STUDY MEDICATION Take 1 tablet by mouth every evening. [facility] for cholesterol. Study medication Anacetrapib or placebo      . testosterone cypionate (DEPOTESTOTERONE CYPIONATE) 200 MG/ML injection Inject 200 mg into the muscle every 14 (fourteen) days.       . fluticasone (FLOVENT DISKUS) 50 MCG/BLIST diskus inhaler Inhale 1 puff into the lungs 2 (two) times daily. 1-2- puffs as needed daily  1 Inhaler  12   No current facility-administered medications  for this visit.    Past Medical History  Diagnosis Date  . CAD (coronary artery disease)   . HTN (hypertension)   . Prostatitis   . Hyperlipidemia   . Obesity   . Diverticular disease   . Gallstones   . Hepatitis age 8  . Arthritis   . Sleep apnea     mild, CPAP wasn't indicated (per pt)--2014 mod-severe, CPAP rec  . Benign prostatic hypertrophy     followed by Dr. Gaynelle Arabian  . Cerebrovascular disease   . Sepsis due to Escherichia coli ? 2011    DUE TO URINARY OUTLET OBSTRUCTION /INFECTION  . UTI (urinary tract infection)     secondary  . Allergy     RHINITIS  . 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 Stanford Breed); s/p stents  . GERD (gastroesophageal reflux disease)   . Hypogonadism male     treated by Dr. Gaynelle Arabian  . Erectile dysfunction   . Peripheral vascular disease     02/28/12 MOST RECENT CAROTID DUPLEX--"STABLE MILD CALCIFIED PLAQUE, BILATERALY, STABLE, OVER SERIAL EXAMS"  . 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 IFG (impaired fasting glucose)   . Pneumonia     hx of  . 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 Total knee arthroplasty  L 4/06, R 4/07  . Vasectomy    . Tonsillectomy    . Transurethral resection of prostate  06/2010  . Knee surgery  05/2010    removing scar tissue R knee (Dr. Wynelle Link)  . Coronary angioplasty with stent placement  01/1997    PTCA/stent to RCA  AND 2009 2ND STENT PLACED  . Joint replacement      both knees  . Total knee revision  09/23/2012    Procedure: TOTAL KNEE REVISION;  Surgeon: Gearlean Alf, MD;  Location: [facility];  Service: Orthopedics;  Laterality: Right;  . Coronary artery bypass graft N/A 04/15/2014    Procedure: CORONARY ARTERY BYPASS GRAFTING TIMES TWO USEING LEFT INTERNAL MAMMARY ARTERY AND RIGHT GREATER SAPHENOUS VEIN VIA ENDOVEIN HARVEST;  Surgeon: Rexene Alberts, MD;  Location: [facility];  Service: Open Heart Surgery;   Laterality: N/A;  . Intraoperative transesophageal echocardiogram N/A 04/15/2014    Procedure: INTRAOPERATIVE TRANSESOPHAGEAL ECHOCARDIOGRAM;  Surgeon: Rexene Alberts, MD;  Location: [facility];  Service: Open Heart Surgery;  Laterality: N/A;    UYQ:[id number]:ZD  colds or fevers,  weight down 27 pounds Skin:no rashes or ulcers HEENT:no blurred vision, no congestion CV:see HPI PUL:see HPI GI:no diarrhea constipation or melena, no indigestion GU:no hematuria, no dysuria MS:no joint pain, no claudication Neuro:no syncope, no lightheadedness Endo:no diabetes, no thyroid disease  Wt Readings from Last 3 Encounters:  05/10/14 259 lb (117.482 kg)  04/19/14 286 lb 9.6 oz (130 kg)  04/19/14 286 lb 9.6 oz (130 kg)    PHYSICAL EXAM BP 112/64  Pulse 87  Ht 5' 7"  (1.702 m)  Wt 259 lb (117.482 kg)  BMI 40.56 kg/m2 General:Pleasant affect, NAD Skin:Warm and dry, brisk capillary refill HEENT:normocephalic, sclera clear, mucus membranes moist Neck:supple, no JVD, no bruits  Heart:S1S2 RRR without murmur, gallup, rub or click, chest wall incision well approximated and healing chest tube sites have been draining serous fluid they have little Band-Aids over-no infection at site Lungs:clear without rales, rhonchi, or wheezes KZS:WFUXN, soft, non tender, + BS, do not palpate liver spleen or masses Ext:no lower ext edema, 2+ pedal pulses, 2+ radial pulses Neuro:alert and oriented, MAE, follows commands, + facial symmetry  EKG:SR with PACs improved from 04-16-14, borderline 1st degree AV block.  ASSESSMENT AND PLAN S/P CABG x 2 LIMA to LAD, SVG to OM1, EVH via right thigh Doing well postop maintaining sinus rhythm does have PACs but these are chronic.  Watching his diet down 27 pounds.   COUGH Has had cough since surgery and I were to Flovent inhaler to see if this would help prevent his cough which occurs worse at night I also recommended Mucinex to thin out the mucus which is described as thick  mucus  HYPERLIPIDEMIA Lipid Panel     Component Value Date/Time   CHOL 85 04/14/2014 0303   TRIG 114 04/14/2014 0303   HDL 33* 04/14/2014 0303   CHOLHDL 2.6 04/14/2014 0303   VLDL 23 04/14/2014 0303   LDLCALC 29 04/14/2014 0303   On Lipitor 80 mg  Diabetes type 2, controlled Patient would like to get off metformin his glucose has been stable on it 93 200 he will continue the metformin until he sees his primary care physician, they will decide the best course of treatment  HYPERTENSION Controlled

## 2014-05-10 NOTE — Patient Instructions (Signed)
Your physician recommends that you schedule a follow-up appointment in:  9/21 with Dr. Stanford Breed  Robitussin or mucinex as needed for cough  Use inhaler as needed

## 2014-05-11 ENCOUNTER — Telehealth: Payer: Self-pay | Admitting: *Deleted

## 2014-05-11 ENCOUNTER — Other Ambulatory Visit: Payer: Self-pay | Admitting: Thoracic Surgery (Cardiothoracic Vascular Surgery)

## 2014-05-11 DIAGNOSIS — I5031 Acute diastolic (congestive) heart failure: Secondary | ICD-10-CM

## 2014-05-11 NOTE — Telephone Encounter (Signed)
This patient was seen by Cecilie Kicks, NP yesterday and he states there is a mix up in his medications and wants to speak with JC.

## 2014-05-11 NOTE — Telephone Encounter (Signed)
Pt.s flovent inhaler was denied and the alternatives have to be approved by Dr. Stanford Breed , pt. Encouraged to see PCP for this med,, pt stated he was going to hold off until he thought he needed it because he thinks he might not need it

## 2014-05-11 NOTE — Telephone Encounter (Signed)
[facility] sent prior auth for inhaler that Jeffrey Wiggins had ordered but [facility] denied this and said send it to PCP. Spoke with patient and gave him the names of the ones Insurance would pay for and informed him just to call PCP and request one of those.

## 2014-05-11 NOTE — Telephone Encounter (Signed)
Message forwarded to [facility]

## 2014-05-12 DIAGNOSIS — E119 Type 2 diabetes mellitus without complications: Secondary | ICD-10-CM | POA: Insufficient documentation

## 2014-05-12 NOTE — Assessment & Plan Note (Signed)
Has had cough since surgery and I were to Flovent inhaler to see if this would help prevent his cough which occurs worse at night I also recommended Mucinex to thin out the mucus which is described as thick mucus

## 2014-05-12 NOTE — Assessment & Plan Note (Signed)
Patient would like to get off metformin his glucose has been stable on it 93 200 he will continue the metformin until he sees his primary care physician, they will decide the best course of treatment

## 2014-05-12 NOTE — Assessment & Plan Note (Signed)
Controlled.  

## 2014-05-12 NOTE — Assessment & Plan Note (Signed)
Lipid Panel     Component Value Date/Time   CHOL 85 04/14/2014 0303   TRIG 114 04/14/2014 0303   HDL 33* 04/14/2014 0303   CHOLHDL 2.6 04/14/2014 0303   VLDL 23 04/14/2014 0303   LDLCALC 29 04/14/2014 0303   On Lipitor 80 mg

## 2014-05-12 NOTE — Assessment & Plan Note (Signed)
LIMA to LAD, SVG to OM1, EVH via right thigh Doing well postop maintaining sinus rhythm does have PACs but these are chronic.  Watching his diet down 27 pounds.

## 2014-05-13 ENCOUNTER — Other Ambulatory Visit: Payer: Self-pay | Admitting: Family Medicine

## 2014-05-13 ENCOUNTER — Telehealth: Payer: Self-pay | Admitting: Cardiology

## 2014-05-13 NOTE — Telephone Encounter (Signed)
Pt.s insurance will not pay for advair diskus  , please advise

## 2014-05-13 NOTE — Telephone Encounter (Signed)
Deatra Canter is calling in regards to the pt taking Flovent Diskus . She stated that this medication is not covered by his insurance and would like to discuss other alternatives with the nurse or doctor. Please call  Thanks

## 2014-05-13 NOTE — Telephone Encounter (Signed)
I thought Dr. Thresa Ross said he did not need?  And the pt decided he did not need.  Will defer to PCP.

## 2014-05-13 NOTE — Telephone Encounter (Signed)
Sounds good to me

## 2014-05-16 ENCOUNTER — Encounter: Payer: Self-pay | Admitting: Thoracic Surgery (Cardiothoracic Vascular Surgery)

## 2014-05-16 ENCOUNTER — Ambulatory Visit
Admission: RE | Admit: 2014-05-16 | Discharge: 2014-05-16 | Disposition: A | Discharge status: Home / Self Care | Payer: 59 | Source: Ambulatory Visit | Attending: Thoracic Surgery (Cardiothoracic Vascular Surgery) | Admitting: Thoracic Surgery (Cardiothoracic Vascular Surgery)

## 2014-05-16 ENCOUNTER — Ambulatory Visit (INDEPENDENT_AMBULATORY_CARE_PROVIDER_SITE_OTHER): Payer: Self-pay | Admitting: Thoracic Surgery (Cardiothoracic Vascular Surgery)

## 2014-05-16 VITALS — BP 126/69 | HR 80 | Ht 67.0 in | Wt 259.0 lb

## 2014-05-16 DIAGNOSIS — I5031 Acute diastolic (congestive) heart failure: Secondary | ICD-10-CM

## 2014-05-16 DIAGNOSIS — Z951 Presence of aortocoronary bypass graft: Secondary | ICD-10-CM

## 2014-05-16 NOTE — Patient Instructions (Signed)
The patient should continue to avoid any heavy lifting or strenuous use of arms or shoulders for at least a total of three months from the time of surgery.  The patient may return to driving an automobile as long as they are no longer requiring oral narcotic pain relievers during the daytime.  It would be wise to start driving only short distances during the daylight and gradually increase from there as they feel comfortable.  The patient is encouraged to enroll and participate in the outpatient cardiac rehab program beginning as soon as practical.  The patient is reminded to make every effort to keep their diabetes under very tight control.  They should follow up closely with their primary care physician or endocrinologist and strive to keep their hemoglobin A1c levels as low as possible, preferably near or below 6.0  The patient is encouraged to continue to lose weight.

## 2014-05-16 NOTE — Progress Notes (Signed)
[address]       [place] [address]             [phone]     CARDIOTHORACIC SURGERY OFFICE NOTE  Referring Provider is Lelon Perla, MD PCP is [name], MD   HPI:  Patient returns for routine followup status post coronary artery bypass grafting x2 on 04/15/2014 for left main disease with progressive symptoms of exertional shortness of breath.  His postoperative recovery in the hospital was entirely uncomplicated. Since hospital discharge she has continued to do well. He was seen in followup by Cecilie Kicks at [facility] last week and returns to our office today for routine follow up.  The patient reports that he is doing exceptionally well. He states that he has had an intermittent dry nonproductive cough that has been slowly improving. Otherwise he has absolutely no complaints whatsoever. He has very mild soreness in his chest that really only bothersome when he coughs or moves suddenly. He has not been using any sort of pain relievers. He has no shortness of breath and in fact he states that his breathing is already considerably better than it was prior to surgery. He is sleeping well at night although he is still sleeping in a recliner. His activity level is quite good and he is looking forward to getting started in the cardiac rehabilitation program. His blood sugars have been under very good control and he has continued to lose weight. He and his wife have been monitoring his diet very carefully. Overall he has no complaints.   Current Outpatient Prescriptions  Medication Sig Dispense Refill  . acetaminophen (TYLENOL) 500 MG tablet Take 1,000 mg by mouth at bedtime as needed for mild pain.       Marland Kitchen aspirin EC 325 MG EC tablet Take 1 tablet (325 mg total) by mouth daily.  30 tablet  0  . atorvastatin (LIPITOR) 80 MG tablet Take 80 mg by mouth every evening.      Marland Kitchen CALCIUM-MAGNESIUM-ZINC PO Take 1 each by mouth daily.      . Cholecalciferol (VITAMIN  D) 2000 UNITS tablet Take 2,000 Units by mouth daily.      Marland Kitchen co-enzyme Q-10 30 MG capsule Take 30 mg by mouth 3 (three) times daily.      . fluticasone (FLONASE) 50 MCG/ACT nasal spray USE 2 SPRAYS INTO THE NOSE DAILY  3 g  0  . fluticasone (FLOVENT DISKUS) 50 MCG/BLIST diskus inhaler Inhale 1 puff into the lungs 2 (two) times daily. 1-2- puffs as needed daily  1 Inhaler  12  . furosemide (LASIX) 20 MG tablet Take 1 tablet (20 mg total) by mouth daily.  90 tablet  3  . glucose blood test strip Use as instructed  100 each  12  . glucose monitoring kit (FREESTYLE) monitoring kit 1 each by Does not apply route as needed for other.  1 each  0  . LANCETS ULTRA THIN 30G MISC 1 Units by Does not apply route 4 (four) times daily -  before meals and at bedtime.  100 each  12  . loratadine (CLARITIN) 10 MG tablet Take 10 mg by mouth daily.       . metFORMIN (GLUCOPHAGE) 500 MG tablet Take 1 tablet (500 mg total) by mouth 2 (two) times daily with a meal.  60 tablet  3  . metoprolol tartrate (LOPRESSOR) 25 MG tablet Take 1 tablet (25 mg total) by mouth 2 (  two) times daily.  60 tablet  3  . omeprazole (PRILOSEC) 20 MG capsule Take 20 mg by mouth every other day.       . oxyCODONE (OXY IR/ROXICODONE) 5 MG immediate release tablet Take 1-2 tablets (5-10 mg total) by mouth every 4 (four) hours as needed for moderate pain.  30 tablet  0  . STUDY MEDICATION Take 1 tablet by mouth every evening. [employer] [facility] for cholesterol. Study medication Anacetrapib or placebo      . testosterone cypionate (DEPOTESTOTERONE CYPIONATE) 200 MG/ML injection Inject 200 mg into the muscle every 14 (fourteen) days.       . potassium chloride SA (K-DUR,KLOR-CON) 10 MEQ tablet Take 1 tablet (10 mEq total) by mouth once.  30 tablet  3   No current facility-administered medications for this visit.      Physical Exam:   BP 126/69  Pulse 80  Ht 5' 7"  (1.702 m)  Wt 259 lb (117.482 kg)  BMI 40.56 kg/m2  SpO2  96%  General:  Well-appearing  Chest:   Clear  CV:   Regular rate and rhythm without murmur  Incisions:  Healing nicely, sternum is stable  Abdomen:  Soft and nontender  Extremities:  Warm and well-perfused  Diagnostic Tests:  CHEST 2 VIEW  COMPARISON: Radiograph 04/18/2014  FINDINGS:  Sternotomy wires overlie normal cardiac silhouette. There is no  effusion, infiltrate, or pneumothorax. Degenerate spurring of the  spine noted.  IMPRESSION:  No acute cardiopulmonary process.  Electronically Signed  By: Suzy Bouchard M.D.  On: 05/16/2014 15:34    Impression:  Patient is doing very well approximately one month status post coronary artery bypass grafting.  Plan:  I've encouraged patient to continue to gradually increase his physical activity without any particular limitations other than the fact that he should continue to avoid any heavy lifting or strenuous use of his arms or shoulders for least another 2 months. I've encouraged him to go ahead and get started in the cardiac rehabilitation program. I think he may resume driving an automobile. We have not made any changes to his current medications. All of his questions have been addressed. The patient will return for routine followup next July approximately one year following his surgery. He will call and return sooner as needed should any further problems or difficulties arise.   Valentina Gu. Roxy Manns, MD 05/16/2014 3:45 PM

## 2014-05-18 ENCOUNTER — Encounter: Payer: Self-pay | Admitting: Family Medicine

## 2014-05-18 ENCOUNTER — Ambulatory Visit (INDEPENDENT_AMBULATORY_CARE_PROVIDER_SITE_OTHER): Payer: 59 | Admitting: Family Medicine

## 2014-05-18 VITALS — BP 112/68 | HR 72 | Ht 67.0 in | Wt 256.0 lb

## 2014-05-18 DIAGNOSIS — E119 Type 2 diabetes mellitus without complications: Secondary | ICD-10-CM

## 2014-05-18 DIAGNOSIS — R05 Cough: Secondary | ICD-10-CM

## 2014-05-18 DIAGNOSIS — R059 Cough, unspecified: Secondary | ICD-10-CM

## 2014-05-18 DIAGNOSIS — E7800 Pure hypercholesterolemia, unspecified: Secondary | ICD-10-CM

## 2014-05-18 DIAGNOSIS — Z951 Presence of aortocoronary bypass graft: Secondary | ICD-10-CM

## 2014-05-18 DIAGNOSIS — E669 Obesity, unspecified: Secondary | ICD-10-CM

## 2014-05-18 DIAGNOSIS — J309 Allergic rhinitis, unspecified: Secondary | ICD-10-CM

## 2014-05-18 DIAGNOSIS — K219 Gastro-esophageal reflux disease without esophagitis: Secondary | ICD-10-CM

## 2014-05-18 DIAGNOSIS — I10 Essential (primary) hypertension: Secondary | ICD-10-CM

## 2014-05-18 MED ORDER — BECLOMETHASONE DIPROPIONATE 40 MCG/ACT IN AERS: 1.0000 | INHALATION_SPRAY | Freq: Two times a day (BID) | RESPIRATORY_TRACT | Status: DC

## 2014-05-18 NOTE — Progress Notes (Signed)
Chief Complaint  Patient presents with  . Advice Only    while in hospital post bypass surgery he was told that he was diabetic and placed on metformin twice a day. He does not feel that this was needed-is here today for confirmation. Also was put on flovent but ins will not cover-would qvar or pulmicort as these are covered.    Patient presents to discuss his diabetes, and to update me on his recent history.  He had CABG 7/31.  He developed dyspnea and burning across his chest when doing cardio at the gym, which is when he called Dr. Stanford Breed and scheduled appointment. He has had an uncomplicated course, and is doing well.  He has orientation for cardiac rehab tomorrow, and starts next week.  He had dry cough, tickle in his chest ever since the intubation from his surgery. Rarely gets up phlegm, which is clear.  He was prescribed Flovent to help with this, but he wasn't able to fill it due to not being covered by insurance.  It has improved slightly (but just perhaps he thinks that because he doesn't notice it now that coughing isn't painful).  He has had a chronic cough for many years--this is worse.  He has used albuterol a few times at night, which was helpful.  He states that his reflux is well controlled with every other day PPI.  He presents today to discuss diabetes.  He is asking about whether diabetes can be related to having bypass surgery.  He complains that sugars were checked 1 hr after eating a lunch which included juice.  He was put on Metformin and he is concerned that might ultimately affect the function of his pancreas. He has been following a "diabetic diet" per his wife (who is insulin-dependent diabetic).  Fasting sugars are running 90-103, 93 this morning.  87 before dinner yesterday.  This has been since hospital discharge, when he was started on metformin.  He denies any side effects to the metformin (if anything, slightly constipated).  He has been referred for diabetes education  and has appt scheduled.  Review of the records shows that he was diagnosed with DM based on A1c of 6.7 in September 2014.  He saw Tonie Griffith.  He was told that almond milk didn't contain calcium (which is does) and that her doctors were writing too large of a dose of lipitor, and he admits that he tuned her out from there on, and didn't follow-up.  He lost 32 pounds since prior to surgery, and is 20 pounds lighter than his usually weight over the last couple of years.  Hyperlipidemia:  He remains on a study drug, plus 43m of lipitor.  He isn't having side effects or problems.  Past Medical History  Diagnosis Date  . CAD (coronary artery disease)   . HTN (hypertension)   . Prostatitis   . Hyperlipidemia   . Obesity   . Diverticular disease   . Gallstones   . Hepatitis age 73 . Arthritis   . Sleep apnea     mild, CPAP wasn't indicated (per pt)--2014 mod-severe, CPAP rec  . Benign prostatic hypertrophy     followed by Dr. TGaynelle Arabian . Cerebrovascular disease   . Sepsis due to Escherichia coli ? 2011    DUE TO URINARY OUTLET OBSTRUCTION /INFECTION  . UTI (urinary tract infection)     secondary  . Allergy     RHINITIS  . Myocardial infarction '98, '09    (  Dr. Stanford Breed); s/p stents  . GERD (gastroesophageal reflux disease)   . Hypogonadism male     treated by Dr. Gaynelle Arabian  . Erectile dysfunction   . Peripheral vascular disease     02/28/12 MOST RECENT CAROTID DUPLEX--"STABLE MILD CALCIFIED PLAQUE, BILATERALY, STABLE, OVER SERIAL EXAMS"  . 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 IFG (impaired fasting glucose)   . Pneumonia     hx of  . 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 Total knee arthroplasty  L 4/06, R 4/07  . Vasectomy    . Tonsillectomy    . Transurethral resection of prostate  06/2010  . Knee surgery  05/2010    removing scar tissue R knee (Dr. Wynelle Link)   . Coronary angioplasty with stent placement  01/1997    PTCA/stent to RCA  AND 2009 2ND STENT PLACED  . Joint replacement      both knees  . Total knee revision  09/23/2012    Procedure: TOTAL KNEE REVISION;  Surgeon: Gearlean Alf, MD;  Location: [facility];  Service: Orthopedics;  Laterality: Right;  . Coronary artery bypass graft N/A 04/15/2014    Procedure: CORONARY ARTERY BYPASS GRAFTING TIMES TWO USEING LEFT INTERNAL MAMMARY ARTERY AND RIGHT GREATER SAPHENOUS VEIN VIA ENDOVEIN HARVEST;  Surgeon: Rexene Alberts, MD;  Location: [place];  Service: Open Heart Surgery;  Laterality: N/A;  . Intraoperative transesophageal echocardiogram N/A 04/15/2014    Procedure: INTRAOPERATIVE TRANSESOPHAGEAL ECHOCARDIOGRAM;  Surgeon: Rexene Alberts, MD;  Location: [place];  Service: Open Heart Surgery;  Laterality: N/A;   History   Social History  . Marital Status: Married    Spouse Name: N/A    Number of Children: 4  . Years of Education: N/A   Occupational History  . CPA    Social History Main Topics  . Smoking status: Former Smoker -- 1.50 packs/day for 30 years    Types: Cigarettes    Quit date: 09/16/1986  . Smokeless tobacco: Never Used     Comment: 35 pack year history, none in 18 years  . Alcohol Use: Yes     Comment: 1 glass of wine twice a week (previously daily)  . Drug Use: No  . Sexual Activity: Not Currently   Other Topics Concern  . Not on file   Social History Narrative   Lives with wife.  4 kids, 1 stepchild (1 in [place], 4 in [place]). No pets. Wife is a diabetic   Outpatient Encounter Prescriptions as of 05/18/2014  Medication Sig Note  . acetaminophen (TYLENOL) 500 MG tablet Take 1,000 mg by mouth at bedtime as needed for mild pain.    Marland Kitchen aspirin EC 325 MG EC tablet Take 1 tablet (325 mg total) by mouth daily.   Marland Kitchen atorvastatin (LIPITOR) 80 MG tablet Take 80 mg by mouth every evening.   Marland Kitchen CALCIUM-MAGNESIUM-ZINC PO Take 1 each by mouth daily.   . Cholecalciferol (VITAMIN D) 2000 UNITS  tablet Take 2,000 Units by mouth daily.   . Coenzyme Q10 (CO Q-10) 100 MG CAPS Take 1 capsule by mouth daily.   . fluticasone (FLONASE) 50 MCG/ACT nasal spray USE 2 SPRAYS INTO THE NOSE DAILY   . furosemide (LASIX) 20 MG tablet Take 1 tablet (20 mg total) by mouth daily.   Marland Kitchen glucose blood test strip Use  as instructed   . glucose monitoring kit (FREESTYLE) monitoring kit 1 each by Does not apply route as needed for other.   Marland Kitchen KLOR-CON 10 10 MEQ tablet Take 10 mEq by mouth once.    . Lancets (FREESTYLE) lancets    . LANCETS ULTRA THIN 30G MISC 1 Units by Does not apply route 4 (four) times daily -  before meals and at bedtime.   Marland Kitchen loratadine (CLARITIN) 10 MG tablet Take 10 mg by mouth daily.    . metFORMIN (GLUCOPHAGE) 500 MG tablet Take 1 tablet (500 mg total) by mouth 2 (two) times daily with a meal.   . metoprolol tartrate (LOPRESSOR) 25 MG tablet Take 1 tablet (25 mg total) by mouth 2 (two) times daily.   Marland Kitchen omeprazole (PRILOSEC) 20 MG capsule Take 20 mg by mouth every other day.    . STUDY MEDICATION Take 1 tablet by mouth every evening. [facility] for cholesterol. Study medication Anacetrapib or placebo   . testosterone cypionate (DEPOTESTOTERONE CYPIONATE) 200 MG/ML injection Inject 200 mg into the muscle every 14 (fourteen) days.  04/01/2014: Received from: External Pharmacy  . [DISCONTINUED] co-enzyme Q-10 30 MG capsule Take 30 mg by mouth 3 (three) times daily.   . [DISCONTINUED] potassium chloride SA (K-DUR,KLOR-CON) 10 MEQ tablet Take 1 tablet (10 mEq total) by mouth once.   . beclomethasone (QVAR) 40 MCG/ACT inhaler Inhale 1-2 puffs into the lungs 2 (two) times daily.   . [DISCONTINUED] fluticasone (FLOVENT DISKUS) 50 MCG/BLIST diskus inhaler Inhale 1 puff into the lungs 2 (two) times daily. 1-2- puffs as needed daily 05/18/2014: Never filled due to cost  . [DISCONTINUED] hydrochlorothiazide (HYDRODIURIL) 25 MG tablet Take 25 mg by mouth daily.    . [DISCONTINUED] oxyCODONE (OXY  IR/ROXICODONE) 5 MG immediate release tablet Take 1-2 tablets (5-10 mg total) by mouth every 4 (four) hours as needed for moderate pain.    No Known Allergies  ROS:  He continues to have some right knee pain (tightness, from swelling) if he walks a lot, even since the last surgery. Denies headaches, dizziness, fevers, chills, no urinary complaints (frequency from the diuretic).  Denies excessive thirst.  No numbness/tingling. No edema (resolved).  Musculoskeletal chest wall pain is improving.  Chest pain resolved, no significant exertional dyspnea.  +cough.  +intentional weight loss  PHYSICAL EXAM: BP 112/68  Pulse 72  Ht 5' 7"  (1.702 m)  Wt 256 lb (116.121 kg)  BMI 40.09 kg/m2 Pleasant, obese male, in no distress Occasional dry cough.  Speaking easily in full sentences without coughing. HEENT:  PERRL, EOMI, conjunctiva clear.  Nose with yellowish mucus and crusting on R>L.  Sinuses are nontender. OP is clear Neck: no lymphadenopathy, thyromegaly or bruit Heart: regular rate and rhythm Chest: incisions healing well without infection Lungs: clear bilaterally Abdomen: obese, soft, normal bowel sounds.  Nontender, no mass Extremities: Trace pretib edema Skin: no bruising, rashes; normal turgor Psych: normal mood, affect, hygiene and grooming Neuro: alert and oriented.  Cranial nerves intact. Normal strength, sensation, gait.  ASSESSMENT/PLAN:  Type II or unspecified type diabetes mellitus without mention of complication, not stated as uncontrolled - counseled re: diet, exercise, weight loss, goals, risks. discussed metformin in detail--tolerating without problems, so rec continue.  consider d/c in future  Cough - component of allergies/PND seen on exam.  RAD suspected given resonse to albuterol.  Change to covered inhaled steroid.  counseled on use, rinsing mouth - Plan: beclomethasone (QVAR) 40 MCG/ACT inhaler  Pure hypercholesterolemia -  values are much lower than they need to be.  He  is on a study drug, so doubt we can make changes without contacting them; suggested lower lipitor dose  OBESITY - congratulated on his weight loss--significant dietary improvements; encouraged to continue  HYPERTENSION - controlled  S/P CABG x 2 - doing well.  plans to start cardiac rehab  Diabetes type 2, controlled  Allergic rhinitis, cause unspecified - continue allergy meds; consider neti-pot, mucinex prn  Gastroesophageal reflux disease, esophagitis presence not specified - reflux symptoms are controlled.  ?if cough is due to PND vs RAD vs GERD (causing RAD).  consider taking daily x1-2wks to see if cough improves   Netipot.  Continue diet, weight loss.  Continue metformin for now 3 mo f/u, A1c at visit.  Might consider stopping metformin in future (when A1c closer to 6), vs changing to ER 570m as stepdown and to simplify med regimen  Pt to discuss lipids with cardiologist (lower than need to be)  Counseled re: diabetic diet in detail Constipation--increase water intake; high fiber diet.  Diabetic foot exam at f/u  45 minute visit, more than 1/2 spent counseling.  All questions answered.

## 2014-05-18 NOTE — Patient Instructions (Signed)
Try sinus rinses to help lessen the postnasal drainage that contributes to cough. Start Qvar 1 puff twice daily.  Rinse mouth out after use.  Increase to 2 puffs twice daily after 1-2 weeks if no improvement noted.  Continue healthy diet. Continue metformin.  Discuss lipids with your cardiologist (lower than they need to be, but you are on the study).  Return in 3 months

## 2014-05-19 ENCOUNTER — Encounter (HOSPITAL_COMMUNITY)
Admission: RE | Admit: 2014-05-19 | Discharge: 2014-05-19 | Disposition: A | Discharge status: Home / Self Care | Payer: 59 | Source: Ambulatory Visit | Attending: Cardiology | Admitting: Cardiology

## 2014-05-19 DIAGNOSIS — I252 Old myocardial infarction: Secondary | ICD-10-CM | POA: Insufficient documentation

## 2014-05-19 DIAGNOSIS — I2510 Atherosclerotic heart disease of native coronary artery without angina pectoris: Secondary | ICD-10-CM | POA: Insufficient documentation

## 2014-05-19 DIAGNOSIS — Z951 Presence of aortocoronary bypass graft: Secondary | ICD-10-CM | POA: Insufficient documentation

## 2014-05-19 NOTE — Progress Notes (Signed)
Cardiac Rehab Medication Review by a Pharmacist  Does the patient  feel that his/her medications are working for him/her?  yes  Has the patient been experiencing any side effects to the medications prescribed?  no  Does the patient measure his/her own blood pressure or blood glucose at home?  yes   Does the patient have any problems obtaining medications due to transportation or finances?   no  Understanding of regimen: good Understanding of indications: good Potential of compliance: excellent  Pharmacist comments: Pt has good understanding of medications and reports compliance. Will plan to talk to physician about possibly lowering his dose of Lipitor because pt reports his cholesterol is very well controlled. Pt reports taking BP and blood glucose at home. Reports good BP, usually around 120/70 and good CBGs around 80-100.  Reginia Naas 05/19/2014 8:13 AM

## 2014-05-25 ENCOUNTER — Encounter (HOSPITAL_COMMUNITY)
Admission: RE | Admit: 2014-05-25 | Discharge: 2014-05-25 | Disposition: A | Discharge status: Home / Self Care | Payer: 59 | Source: Ambulatory Visit | Attending: Cardiology | Admitting: Cardiology

## 2014-05-25 DIAGNOSIS — Z951 Presence of aortocoronary bypass graft: Secondary | ICD-10-CM | POA: Diagnosis not present

## 2014-05-25 DIAGNOSIS — I252 Old myocardial infarction: Secondary | ICD-10-CM | POA: Diagnosis not present

## 2014-05-25 DIAGNOSIS — I2510 Atherosclerotic heart disease of native coronary artery without angina pectoris: Secondary | ICD-10-CM | POA: Diagnosis present

## 2014-05-25 LAB — GLUCOSE, CAPILLARY
GLUCOSE-CAPILLARY: 99 mg/dL (ref 70–99)
Glucose-Capillary: 103 mg/dL — ABNORMAL HIGH (ref 70–99)

## 2014-05-25 NOTE — Progress Notes (Signed)
Pt started cardiac rehab today.  Pt tolerated light exercise without difficulty. Telemetry rhythm Sinus. Vital signs stable. Avid's goals are to increase his strength and to go back to the gym

## 2014-05-27 ENCOUNTER — Telehealth: Payer: Self-pay | Admitting: Medical

## 2014-05-27 ENCOUNTER — Encounter (HOSPITAL_COMMUNITY)
Admission: RE | Admit: 2014-05-27 | Discharge: 2014-05-27 | Disposition: A | Discharge status: Home / Self Care | Payer: 59 | Source: Ambulatory Visit | Attending: Cardiology | Admitting: Cardiology

## 2014-05-27 DIAGNOSIS — I2510 Atherosclerotic heart disease of native coronary artery without angina pectoris: Secondary | ICD-10-CM | POA: Diagnosis not present

## 2014-05-27 LAB — GLUCOSE, CAPILLARY
GLUCOSE-CAPILLARY: 102 mg/dL — AB (ref 70–99)
GLUCOSE-CAPILLARY: 95 mg/dL (ref 70–99)

## 2014-05-27 NOTE — Progress Notes (Signed)
Jeffrey Wiggins's pre exercise CBG was 95 this morning. Patient ate breakfast at 0630 and was given a banana and some lemonade. Recheck CBG 102. Jeffrey Wiggins PAC at Dr Johnsie Kindred office called and notified of low                                                                                                                                Jeffrey Wiggins's pre exercise CBG was 95.  Patient was given a banana and lemonade. Jeffrey Wiggins ate breakfast at 0630.  Repeat  CB G 102. Dr Johnsie Kindred office called and notified. I spoke with Jeffrey Ogle PA who told [name] over the phone to decrease his metformin to 500mg  once a day. Will fax exercise flow sheets to Dr. Johnsie Kindred office for review.

## 2014-05-27 NOTE — Telephone Encounter (Signed)
Noted.  He was only just started on metformin after recent hospitalization (and as you know, won't cause hypoglycemia). He should have been fine at BID, but will also be fine on qd (A1c 6.5 prior to any meds)

## 2014-05-27 NOTE — Telephone Encounter (Signed)
We received a call from Verdis Frederickson, nurse at the [facility].  Their protocol is that time blood sugar should be over 100 for therapy.  The patient was present at the time of the call, also on the phone call /Speakerphone.  They normally check blood sugar before therapy and all of this week his glucose has been either right at 100 or just a little under 100.  He has no symptoms, feels fine but apparently has lost significant weight in recent weeks to months with exercise, intention weight loss. He is taking metformin 500mg  twice a day.  They asked if we could modify his regimen.   I looked back through Dr. Johnsie Kindred notes and labs and recent diabetic visit.  At this time I asked him to change to metformin 500 once daily.  They agree with plan they will call back if any concerns.  Of note last diabetic followup there was mentioning of stopping metformin potentially completely if his sugars continue to be as good as they have been.

## 2014-05-30 ENCOUNTER — Encounter (HOSPITAL_COMMUNITY): Payer: 59

## 2014-06-01 ENCOUNTER — Encounter (HOSPITAL_COMMUNITY): Payer: 59

## 2014-06-03 ENCOUNTER — Encounter (HOSPITAL_COMMUNITY): Payer: 59

## 2014-06-06 ENCOUNTER — Ambulatory Visit (INDEPENDENT_AMBULATORY_CARE_PROVIDER_SITE_OTHER): Payer: 59 | Admitting: Cardiology

## 2014-06-06 ENCOUNTER — Encounter: Payer: Self-pay | Admitting: *Deleted

## 2014-06-06 ENCOUNTER — Encounter (HOSPITAL_COMMUNITY)
Admission: RE | Admit: 2014-06-06 | Discharge: 2014-06-06 | Disposition: A | Discharge status: Home / Self Care | Payer: 59 | Source: Ambulatory Visit | Attending: Cardiology | Admitting: Cardiology

## 2014-06-06 ENCOUNTER — Encounter: Payer: Self-pay | Admitting: Cardiology

## 2014-06-06 VITALS — BP 140/80 | HR 50 | Ht 67.0 in | Wt 261.7 lb

## 2014-06-06 DIAGNOSIS — I509 Heart failure, unspecified: Secondary | ICD-10-CM

## 2014-06-06 DIAGNOSIS — R0989 Other specified symptoms and signs involving the circulatory and respiratory systems: Secondary | ICD-10-CM

## 2014-06-06 DIAGNOSIS — E785 Hyperlipidemia, unspecified: Secondary | ICD-10-CM

## 2014-06-06 DIAGNOSIS — I10 Essential (primary) hypertension: Secondary | ICD-10-CM

## 2014-06-06 DIAGNOSIS — I5031 Acute diastolic (congestive) heart failure: Secondary | ICD-10-CM

## 2014-06-06 DIAGNOSIS — I2510 Atherosclerotic heart disease of native coronary artery without angina pectoris: Secondary | ICD-10-CM | POA: Diagnosis not present

## 2014-06-06 LAB — GLUCOSE, CAPILLARY
GLUCOSE-CAPILLARY: 104 mg/dL — AB (ref 70–99)
Glucose-Capillary: 90 mg/dL (ref 70–99)

## 2014-06-06 NOTE — Assessment & Plan Note (Signed)
Abdominal ultrasound to exclude aneurysm. 

## 2014-06-06 NOTE — Assessment & Plan Note (Signed)
Some complaints of myalgias. Continue present dose of Lipitor. If symptoms worsen we will decrease dose.

## 2014-06-06 NOTE — Assessment & Plan Note (Signed)
Followup carotid Dopplers July 2016.  

## 2014-06-06 NOTE — Assessment & Plan Note (Signed)
Blood pressure borderline. Typically controlled on outpatient checks. Continue present regimen and follow.

## 2014-06-06 NOTE — Patient Instructions (Signed)
Your physician wants you to follow-up in: 6 MONTHS WITH DR CRENSHAW You will receive a reminder letter in the mail two months in advance. If you don't receive a letter, please call our office to schedule the follow-up appointment.   Your physician has requested that you have an abdominal aorta duplex. During this test, an ultrasound is used to evaluate the aorta. Allow 30 minutes for this exam. Do not eat after midnight the day before and avoid carbonated beverages  

## 2014-06-06 NOTE — Progress Notes (Signed)
Reviewed home exercise with pt today.  Pt plans to walk and use stationary bike at home for exercise.  Reviewed THR, pulse, RPE, sign and symptoms, and when to call 911 or MD.  Pt voiced understanding. Alberteen Sam, MA, ACSM RCEP

## 2014-06-06 NOTE — Assessment & Plan Note (Signed)
Continue aspirin and statin. 

## 2014-06-06 NOTE — Progress Notes (Signed)
HPI: FU coronary artery disease, hypertension, and hyperlipidemia. His cardiac problems initially developed in 1998 when he suffered a Myocardial Infarction which was treated with PCI and stent placement. He did well until 2009 at which time he again suffered an inferior wall myocardial infarction, which was again treated with PCI and stent placement to the RCA. In 7/15 developed exertional shortness of breath and had abnormal nuclear stress test. Echo 7/15 showed normal LV function, grade 2 diastolic dysfunction and mild LAE. Carotid dopplers 7/15 showed 50-69 right and 0-49 left; fu recommended in 12 months. Cath with severe CAD and EF 50. He underwent bypass grafting x2 with LIMA to the LAD and vein graft to the first obtuse marginal branch of the left circumflex. Since last seen, the patient denies any dyspnea on exertion, orthopnea, PND, pedal edema, palpitations, syncope or chest pain.   Current Outpatient Prescriptions  Medication Sig Dispense Refill  . acetaminophen (TYLENOL) 500 MG tablet Take 1,000 mg by mouth at bedtime as needed for mild pain.       Marland Kitchen aspirin EC 325 MG EC tablet Take 1 tablet (325 mg total) by mouth daily.  30 tablet  0  . atorvastatin (LIPITOR) 80 MG tablet Take 80 mg by mouth every evening.      . beclomethasone (QVAR) 40 MCG/ACT inhaler Inhale 1-2 puffs into the lungs 2 (two) times daily.  1 Inhaler  5  . CALCIUM-MAGNESIUM-ZINC PO Take 1 each by mouth daily.      . Cholecalciferol (VITAMIN D) 2000 UNITS tablet Take 2,000 Units by mouth daily.      . Coenzyme Q10 (CO Q-10) 100 MG CAPS Take 1 capsule by mouth daily.      . fluticasone (FLONASE) 50 MCG/ACT nasal spray USE 2 SPRAYS INTO THE NOSE DAILY  3 g  0  . furosemide (LASIX) 20 MG tablet Take 1 tablet (20 mg total) by mouth daily.  90 tablet  3  . glucose blood test strip Use as instructed  100 each  12  . glucose monitoring kit (FREESTYLE) monitoring kit 1 each by Does not apply route as needed for other.   1 each  0  . KLOR-CON 10 10 MEQ tablet Take 10 mEq by mouth daily.       . Lancets (FREESTYLE) lancets       . LANCETS ULTRA THIN 30G MISC 1 Units by Does not apply route 4 (four) times daily -  before meals and at bedtime.  100 each  12  . loratadine (CLARITIN) 10 MG tablet Take 10 mg by mouth daily.       . metFORMIN (GLUCOPHAGE) 500 MG tablet Take 500 mg by mouth daily with breakfast.      . metoprolol tartrate (LOPRESSOR) 25 MG tablet Take 1 tablet (25 mg total) by mouth 2 (two) times daily.  60 tablet  3  . omeprazole (PRILOSEC) 20 MG capsule Take 20 mg by mouth every other day.       . STUDY MEDICATION Take 1 tablet by mouth every evening. [facility] for cholesterol. Study medication Anacetrapib or placebo      . testosterone cypionate (DEPOTESTOTERONE CYPIONATE) 200 MG/ML injection Inject 200 mg into the muscle every 14 (fourteen) days.        No current facility-administered medications for this visit.     Past Medical History  Diagnosis Date  . CAD (coronary artery disease)   . HTN (hypertension)   . Prostatitis   .  Hyperlipidemia   . Obesity   . Diverticular disease   . Gallstones   . Hepatitis age 4  . Arthritis   . Sleep apnea     mild, CPAP wasn't indicated (per pt)--2014 mod-severe, CPAP rec  . Benign prostatic hypertrophy     followed by Dr. Gaynelle Arabian  . Cerebrovascular disease   . Sepsis due to Escherichia coli ? 2011    DUE TO URINARY OUTLET OBSTRUCTION /INFECTION  . UTI (urinary tract infection)     secondary  . Allergy     RHINITIS  . 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 Stanford Breed); s/p stents  . GERD (gastroesophageal reflux disease)   . Hypogonadism male     treated by Dr. Gaynelle Arabian  . Erectile dysfunction   . Peripheral vascular disease     02/28/12 MOST RECENT CAROTID DUPLEX--"STABLE MILD CALCIFIED PLAQUE, BILATERALY, STABLE, OVER SERIAL EXAMS"  . 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 IFG (impaired fasting glucose)   .  Pneumonia     hx of  . 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 Total knee arthroplasty  L 4/06, R 4/07  . Vasectomy    . Tonsillectomy    . Transurethral resection of prostate  06/2010  . Knee surgery  05/2010    removing scar tissue R knee (Dr. Wynelle Link)  . Coronary angioplasty with stent placement  01/1997    PTCA/stent to RCA  AND 2009 2ND STENT PLACED  . Joint replacement      both knees  . Total knee revision  09/23/2012    Procedure: TOTAL KNEE REVISION;  Surgeon: Gearlean Alf, MD;  Location: [facility];  Service: Orthopedics;  Laterality: Right;  . Coronary artery bypass graft N/A 04/15/2014    Procedure: CORONARY ARTERY BYPASS GRAFTING TIMES TWO USEING LEFT INTERNAL MAMMARY ARTERY AND RIGHT GREATER SAPHENOUS VEIN VIA ENDOVEIN HARVEST;  Surgeon: Rexene Alberts, MD;  Location: [place];  Service: Open Heart Surgery;  Laterality: N/A;  . Intraoperative transesophageal echocardiogram N/A 04/15/2014    Procedure: INTRAOPERATIVE TRANSESOPHAGEAL ECHOCARDIOGRAM;  Surgeon: Rexene Alberts, MD;  Location: [place];  Service: Open Heart Surgery;  Laterality: N/A;    History   Social History  . Marital Status: Married    Spouse Name: N/A    Number of Children: 4  . Years of Education: N/A   Occupational History  . CPA    Social History Main Topics  . Smoking status: Former Smoker -- 1.50 packs/day for 30 years    Types: Cigarettes    Quit date: 09/16/1986  . Smokeless tobacco: Never Used     Comment: 35 pack year history, none in 18 years  . Alcohol Use: Yes     Comment: 1 glass of wine twice a week (previously daily)  . Drug Use: No  . Sexual Activity: Not Currently   Other Topics Concern  . Not on file   Social History Narrative   Lives with wife.  4 kids, 1 stepchild (1 in [place], 4 in [place]). No pets. Wife is a diabetic    ROS: Nonproductive cough but no fevers or chills,  hemoptysis, dysphasia,  odynophagia, melena, hematochezia, dysuria, hematuria, rash, seizure activity, orthopnea, PND, pedal edema, claudication. Remaining systems are negative.  Physical Exam: Well-developed well-nourished in no acute distress.  Skin is warm and dry.  HEENT is normal.  Neck is supple.  Chest is clear to auscultation with normal expansion. Previous sternotomy without evidence of infection. Cardiovascular exam is regular rate and rhythm.  Abdominal exam nontender or distended. No masses palpated. Bruit noted. Extremities trace edema. neuro grossly intact  ECG 05/10/2014-sinus rhythm, PACs, prior septal and inferior infarct, nonspecific ST changes.

## 2014-06-06 NOTE — Assessment & Plan Note (Signed)
Patient euvolemic on examination. Continue present dose of Lasix. 

## 2014-06-08 ENCOUNTER — Encounter (HOSPITAL_COMMUNITY)
Admission: RE | Admit: 2014-06-08 | Discharge: 2014-06-08 | Disposition: A | Discharge status: Home / Self Care | Payer: 59 | Source: Ambulatory Visit | Attending: Cardiology | Admitting: Cardiology

## 2014-06-08 DIAGNOSIS — I2510 Atherosclerotic heart disease of native coronary artery without angina pectoris: Secondary | ICD-10-CM | POA: Diagnosis not present

## 2014-06-08 LAB — GLUCOSE, CAPILLARY
Glucose-Capillary: 114 mg/dL — ABNORMAL HIGH (ref 70–99)
Glucose-Capillary: 99 mg/dL (ref 70–99)

## 2014-06-10 ENCOUNTER — Encounter (HOSPITAL_COMMUNITY)
Admission: RE | Admit: 2014-06-10 | Discharge: 2014-06-10 | Disposition: A | Discharge status: Home / Self Care | Payer: 59 | Source: Ambulatory Visit | Attending: Cardiology | Admitting: Cardiology

## 2014-06-10 DIAGNOSIS — I2510 Atherosclerotic heart disease of native coronary artery without angina pectoris: Secondary | ICD-10-CM | POA: Diagnosis not present

## 2014-06-10 LAB — GLUCOSE, CAPILLARY
GLUCOSE-CAPILLARY: 98 mg/dL (ref 70–99)
Glucose-Capillary: 114 mg/dL — ABNORMAL HIGH (ref 70–99)

## 2014-06-10 NOTE — Progress Notes (Signed)
Jeffrey Wiggins 73 y.o. male Nutrition Note Spoke with pt. Pt is obese. Per pt, "I've lost 50 lbs." Per EMR wt trends, pt wt is down 28.2 lb over the past 2 months. Rate of wt loss exceeds desired rate. Pt has been trying to lose wt by decreasing portion sizes consumed, increasing fruit and vegetable intake, and exercising more. Nutrition Plan and Nutrition Survey goals reviewed with pt. Pt is following Step 2 of the Therapeutic Lifestyle Changes diet. Pt is diabetic according to EMR, which pt denies. Pt reports last A1c was drawn before Metformin started. Pt is currently taking Metformin once daily at lunch. Pre-exercise CBG's are frequently too low to exercise and pt c/o having to eat a snack "which makes me eat too many carbs." Pt's Metformin decreased from 500 mg BID to 500 mg daily. Pt's wife checks pt's CBG's "fairly regularly and they are never over 90-something." This Probation officer went over Diabetes Education test results. Pt expressed understanding of the information reviewed. Pt aware of nutrition education classes offered. Vitals - 1 value per visit 06/06/2014 05/19/2014 05/18/2014 05/16/2014 05/10/2014  Weight (lb) 261.7 258.82 256 259 259   Vitals - 1 value per visit 04/19/2014 04/15/2014 04/13/2014 04/12/2014 04/08/2014  Weight (lb) 286.6   288 285   Vitals - 1 value per visit 04/01/2014  Weight (lb) 285   Nutrition Diagnosis   Food-and nutrition-related knowledge deficit related to lack of exposure to information as related to diagnosis of: ? CVD ? DM (A1c 6.5)   Obesity related to excessive energy intake as evidenced by a BMI of 39.6  Nutrition RX/ Estimated Daily Nutrition Needs for: wt loss  1650-2150 Kcal, 45-60 gm fat, 11-14 gm sat fat, 1.6-2.2 gm trans-fat, <1500 mg sodium, 175-250 gm CHO   Nutrition Intervention   Pt's individual nutrition plan reviewed with pt.   Benefits of adopting Therapeutic Lifestyle Changes discussed when Medficts reviewed.   Pt to attend the Portion Distortion class    Pt to attend the Diabetes Q & A class   Pt to try holding Metformin and monitoring CBG trends   Will notify MD of pt's plan to hold Metformin.   Pt given handouts for: ? Nutrition I class ? Nutrition II class ? Diabetes Blitz class   Continue client-centered nutrition education by RD, as part of interdisciplinary care. Goal(s)   Pt to identify food quantities necessary to achieve: ? wt loss to a goal wt of 234-252 lb (106.5-114.7 kg) at graduation from cardiac rehab.    Use pre-meal and post-meal CBG's and A1c to determine whether adjustments in food/meal planning will be beneficial or if any meds need to be combined with nutrition therapy. Monitor and Evaluate progress toward nutrition goal with team. Nutrition Risk: Change to Moderate Derek Mound, M.Ed, RD, LDN, CDE 06/10/2014 10:35 AM

## 2014-06-13 ENCOUNTER — Encounter (HOSPITAL_COMMUNITY)
Admission: RE | Admit: 2014-06-13 | Discharge: 2014-06-13 | Disposition: A | Discharge status: Home / Self Care | Payer: 59 | Source: Ambulatory Visit | Attending: Cardiology | Admitting: Cardiology

## 2014-06-13 DIAGNOSIS — I2510 Atherosclerotic heart disease of native coronary artery without angina pectoris: Secondary | ICD-10-CM | POA: Diagnosis not present

## 2014-06-13 LAB — GLUCOSE, CAPILLARY
Glucose-Capillary: 109 mg/dL — ABNORMAL HIGH (ref 70–99)
Glucose-Capillary: 96 mg/dL (ref 70–99)

## 2014-06-14 ENCOUNTER — Ambulatory Visit (HOSPITAL_COMMUNITY)
Admission: RE | Admit: 2014-06-14 | Discharge: 2014-06-14 | Disposition: A | Discharge status: Home / Self Care | Payer: 59 | Source: Ambulatory Visit | Attending: Cardiovascular Disease | Admitting: Cardiovascular Disease

## 2014-06-14 ENCOUNTER — Other Ambulatory Visit (INDEPENDENT_AMBULATORY_CARE_PROVIDER_SITE_OTHER): Payer: 59

## 2014-06-14 ENCOUNTER — Telehealth: Payer: Self-pay | Admitting: Family Medicine

## 2014-06-14 DIAGNOSIS — R0989 Other specified symptoms and signs involving the circulatory and respiratory systems: Secondary | ICD-10-CM

## 2014-06-14 DIAGNOSIS — Z23 Encounter for immunization: Secondary | ICD-10-CM

## 2014-06-14 NOTE — Progress Notes (Signed)
Abdominal Aortic Duplex Completed °Brianna L Mazza,RVT °

## 2014-06-14 NOTE — Telephone Encounter (Signed)
Jeffrey Wiggins @ [facility] returned call to Dr Tomi Bamberger in response to blood sugar readings and request to change meds on this pt. Jeffrey Wiggins sd to let Dr Tomi Bamberger know that when a diabetic pt is cardiac rehab blood sugar should be greater than 100 and if they are on insulin, blood sugar should be greater than 110.

## 2014-06-14 NOTE — Telephone Encounter (Signed)
I spoke with Verdis Frederickson in detail about Dr. Johnsie Kindred note on this patient. CLS

## 2014-06-14 NOTE — Telephone Encounter (Signed)
Please call Verdis Frederickson back and let her know that patient is NOT diabetic, He technically met criteria for diabetes (A1c >6.5) just once, a year ago, when he was 30 pounds heavier.  Now, I would say he has pre-diabetes and should not follow diabetic protocols.  Metformin shouldn't cause hypoglycemia.  We have spoken to pt, and he will be holding his metformin for now--his sugars will likely still be <100, and please don't make him eat extra or not allow him to exercise if his sugar is <100 and NOT on any meds!

## 2014-06-15 ENCOUNTER — Encounter (HOSPITAL_COMMUNITY)
Admission: RE | Admit: 2014-06-15 | Discharge: 2014-06-15 | Disposition: A | Discharge status: Home / Self Care | Payer: 59 | Source: Ambulatory Visit | Attending: Cardiology | Admitting: Cardiology

## 2014-06-15 DIAGNOSIS — I2510 Atherosclerotic heart disease of native coronary artery without angina pectoris: Secondary | ICD-10-CM | POA: Diagnosis not present

## 2014-06-17 ENCOUNTER — Encounter (HOSPITAL_COMMUNITY)
Admission: RE | Admit: 2014-06-17 | Discharge: 2014-06-17 | Disposition: A | Discharge status: Home / Self Care | Payer: 59 | Source: Ambulatory Visit | Attending: Cardiology | Admitting: Cardiology

## 2014-06-17 DIAGNOSIS — Z951 Presence of aortocoronary bypass graft: Secondary | ICD-10-CM | POA: Insufficient documentation

## 2014-06-20 ENCOUNTER — Encounter (HOSPITAL_COMMUNITY)
Admission: RE | Admit: 2014-06-20 | Discharge: 2014-06-20 | Disposition: A | Discharge status: Home / Self Care | Payer: 59 | Source: Ambulatory Visit | Attending: Cardiology | Admitting: Cardiology

## 2014-06-20 DIAGNOSIS — Z951 Presence of aortocoronary bypass graft: Secondary | ICD-10-CM | POA: Diagnosis not present

## 2014-06-22 ENCOUNTER — Encounter (HOSPITAL_COMMUNITY)
Admission: RE | Admit: 2014-06-22 | Discharge: 2014-06-22 | Disposition: A | Discharge status: Home / Self Care | Payer: 59 | Source: Ambulatory Visit | Attending: Cardiology | Admitting: Cardiology

## 2014-06-22 DIAGNOSIS — Z951 Presence of aortocoronary bypass graft: Secondary | ICD-10-CM | POA: Diagnosis not present

## 2014-06-24 ENCOUNTER — Encounter (HOSPITAL_COMMUNITY)
Admission: RE | Admit: 2014-06-24 | Discharge: 2014-06-24 | Disposition: A | Discharge status: Home / Self Care | Payer: 59 | Source: Ambulatory Visit | Attending: Cardiology | Admitting: Cardiology

## 2014-06-24 DIAGNOSIS — Z951 Presence of aortocoronary bypass graft: Secondary | ICD-10-CM | POA: Diagnosis not present

## 2014-06-27 ENCOUNTER — Encounter (HOSPITAL_COMMUNITY)
Admission: RE | Admit: 2014-06-27 | Discharge: 2014-06-27 | Disposition: A | Discharge status: Home / Self Care | Payer: 59 | Source: Ambulatory Visit | Attending: Cardiology | Admitting: Cardiology

## 2014-06-27 DIAGNOSIS — Z951 Presence of aortocoronary bypass graft: Secondary | ICD-10-CM | POA: Diagnosis not present

## 2014-06-29 ENCOUNTER — Encounter (HOSPITAL_COMMUNITY)
Admission: RE | Admit: 2014-06-29 | Discharge: 2014-06-29 | Disposition: A | Discharge status: Home / Self Care | Payer: 59 | Source: Ambulatory Visit | Attending: Cardiology | Admitting: Cardiology

## 2014-06-29 DIAGNOSIS — Z951 Presence of aortocoronary bypass graft: Secondary | ICD-10-CM | POA: Diagnosis not present

## 2014-07-01 ENCOUNTER — Encounter (HOSPITAL_COMMUNITY)
Admission: RE | Admit: 2014-07-01 | Discharge: 2014-07-01 | Disposition: A | Discharge status: Home / Self Care | Payer: 59 | Source: Ambulatory Visit | Attending: Cardiology | Admitting: Cardiology

## 2014-07-01 DIAGNOSIS — Z951 Presence of aortocoronary bypass graft: Secondary | ICD-10-CM | POA: Diagnosis not present

## 2014-07-04 ENCOUNTER — Encounter (HOSPITAL_COMMUNITY)
Admission: RE | Admit: 2014-07-04 | Discharge: 2014-07-04 | Disposition: A | Discharge status: Home / Self Care | Payer: 59 | Source: Ambulatory Visit | Attending: Cardiology | Admitting: Cardiology

## 2014-07-04 DIAGNOSIS — Z951 Presence of aortocoronary bypass graft: Secondary | ICD-10-CM | POA: Diagnosis not present

## 2014-07-06 ENCOUNTER — Encounter (HOSPITAL_COMMUNITY)
Admission: RE | Admit: 2014-07-06 | Discharge: 2014-07-06 | Disposition: A | Discharge status: Home / Self Care | Payer: 59 | Source: Ambulatory Visit | Attending: Cardiology | Admitting: Cardiology

## 2014-07-06 DIAGNOSIS — Z951 Presence of aortocoronary bypass graft: Secondary | ICD-10-CM | POA: Diagnosis not present

## 2014-07-08 ENCOUNTER — Encounter (HOSPITAL_COMMUNITY)
Admission: RE | Admit: 2014-07-08 | Discharge: 2014-07-08 | Disposition: A | Discharge status: Home / Self Care | Payer: 59 | Source: Ambulatory Visit | Attending: Cardiology | Admitting: Cardiology

## 2014-07-08 DIAGNOSIS — Z951 Presence of aortocoronary bypass graft: Secondary | ICD-10-CM | POA: Diagnosis not present

## 2014-07-11 ENCOUNTER — Encounter (HOSPITAL_COMMUNITY)
Admission: RE | Admit: 2014-07-11 | Discharge: 2014-07-11 | Disposition: A | Discharge status: Home / Self Care | Payer: 59 | Source: Ambulatory Visit | Attending: Cardiology | Admitting: Cardiology

## 2014-07-11 DIAGNOSIS — Z951 Presence of aortocoronary bypass graft: Secondary | ICD-10-CM | POA: Diagnosis not present

## 2014-07-11 NOTE — Progress Notes (Signed)
Intermittent PAC's noted on telemetry rhythm. Vital sign stable. Will fax exercise flow sheets to Dr. Jacalyn Lefevre office for review. Jeffrey Wiggins continue's to do well with exercise.

## 2014-07-13 ENCOUNTER — Encounter (HOSPITAL_COMMUNITY)
Admission: RE | Admit: 2014-07-13 | Discharge: 2014-07-13 | Disposition: A | Discharge status: Home / Self Care | Payer: 59 | Source: Ambulatory Visit | Attending: Cardiology | Admitting: Cardiology

## 2014-07-13 DIAGNOSIS — Z951 Presence of aortocoronary bypass graft: Secondary | ICD-10-CM | POA: Diagnosis not present

## 2014-07-15 ENCOUNTER — Other Ambulatory Visit: Payer: Self-pay | Admitting: *Deleted

## 2014-07-15 ENCOUNTER — Encounter (HOSPITAL_COMMUNITY)
Admission: RE | Admit: 2014-07-15 | Discharge: 2014-07-15 | Disposition: A | Discharge status: Home / Self Care | Payer: 59 | Source: Ambulatory Visit | Attending: Cardiology | Admitting: Cardiology

## 2014-07-15 DIAGNOSIS — Z951 Presence of aortocoronary bypass graft: Secondary | ICD-10-CM | POA: Diagnosis not present

## 2014-07-15 NOTE — Progress Notes (Signed)
Casimir says he feels a shifting at times when getting out of the recliner almost on a daily basis. Clem denies having any discomfort here at cardiac rehab. Upon assessment sternum well healed. No unevenness noted. Dr Guy Sandifer office called and notified of the patient's complaints. Marlana Latus RN spoke with the patient over the phone. Melinda has an appointment on Monday to get a chest xray and to follow up with Dr Roxy Manns. Will continue to monitor the patient throughout  the program.

## 2014-07-18 ENCOUNTER — Ambulatory Visit (INDEPENDENT_AMBULATORY_CARE_PROVIDER_SITE_OTHER): Payer: 59 | Admitting: Thoracic Surgery (Cardiothoracic Vascular Surgery)

## 2014-07-18 ENCOUNTER — Encounter (HOSPITAL_COMMUNITY)
Admission: RE | Admit: 2014-07-18 | Discharge: 2014-07-18 | Disposition: A | Discharge status: Home / Self Care | Payer: 59 | Source: Ambulatory Visit | Attending: Cardiology | Admitting: Cardiology

## 2014-07-18 ENCOUNTER — Ambulatory Visit
Admission: RE | Admit: 2014-07-18 | Discharge: 2014-07-18 | Disposition: A | Discharge status: Home / Self Care | Payer: 59 | Source: Ambulatory Visit | Attending: Thoracic Surgery (Cardiothoracic Vascular Surgery) | Admitting: Thoracic Surgery (Cardiothoracic Vascular Surgery)

## 2014-07-18 ENCOUNTER — Encounter: Payer: Self-pay | Admitting: Thoracic Surgery (Cardiothoracic Vascular Surgery)

## 2014-07-18 VITALS — BP 117/64 | HR 74 | Resp 20 | Ht 70.0 in | Wt 260.0 lb

## 2014-07-18 DIAGNOSIS — Z951 Presence of aortocoronary bypass graft: Secondary | ICD-10-CM

## 2014-07-18 DIAGNOSIS — I2510 Atherosclerotic heart disease of native coronary artery without angina pectoris: Secondary | ICD-10-CM

## 2014-07-18 NOTE — Progress Notes (Signed)
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 His postoperative recovery was entirely uncomplicated And he was last seen here in our office on 05/16/2014. Since then he has continued to do very well from a cardiac standpoint. However, recently he has noted a clicking sensation at the inferior aspect of his sternotomy scar with physical movement. This became noticeable while he was participating in the cardiac rehabilitation program, and he returns for office to have this evaluated further.  He states that the sensation is notable only at the very inferior aspect of the sternotomy scar. He has no clicking or pain along the midportion of the body of the sternum or the manubrium. He overall is doing quite well. He has no exertional chest pain or shortness of breath. His exercise tolerance continues to improve.  He otherwise feels quite well.   Current Outpatient Prescriptions  Medication Sig Dispense Refill  . acetaminophen (TYLENOL) 500 MG tablet Take 1,000 mg by mouth at bedtime as needed for mild pain.     Marland Kitchen aspirin EC 325 MG EC tablet Take 1 tablet (325 mg total) by mouth daily. 30 tablet 0  . atorvastatin (LIPITOR) 80 MG tablet Take 80 mg by mouth every evening.    Marland Kitchen CALCIUM-MAGNESIUM-ZINC PO Take 1 each by mouth daily.    . Cholecalciferol (VITAMIN D) 2000 UNITS tablet Take 2,000 Units by mouth daily.    . Coenzyme Q10 (CO Q-10) 100 MG CAPS Take 1 capsule by mouth daily.    . fluticasone (FLONASE) 50 MCG/ACT nasal spray USE 2 SPRAYS INTO THE NOSE DAILY 3 g 0  . furosemide (LASIX) 20 MG tablet Take 1 tablet (20 mg total) by mouth daily. 90 tablet 3  . glucose  blood test strip Use as instructed 100 each 12  . KLOR-CON 10 10 MEQ tablet Take 10 mEq by mouth daily.     Marland Kitchen loratadine (CLARITIN) 10 MG tablet Take 10 mg by mouth daily.     . metoprolol tartrate (LOPRESSOR) 25 MG tablet Take 1 tablet (25 mg total) by mouth 2 (two) times daily. 60 tablet 3  . omeprazole (PRILOSEC) 20 MG capsule Take 20 mg by mouth every other day.     . STUDY MEDICATION Take 1 tablet by mouth every evening. [facility] for cholesterol. Study medication Anacetrapib or placebo    . testosterone cypionate (DEPOTESTOTERONE CYPIONATE) 200 MG/ML injection Inject 200 mg into the muscle every 14 (fourteen) days.      No current facility-administered medications for this visit.      Physical Exam:   BP 117/64 mmHg  Pulse 74  Resp 20  Ht 5\' 10"  (1.778 m)  Wt 260 lb (117.935 kg)  BMI 37.31 kg/m2  SpO2 96%  General:  Well-appearing  Chest:   Clear  CV:   Regular rate and rhythm without murmur  Incisions:  Completely healed, sternum is stable and nontender. Palpation over the xiphoid process is nontender but associated with reproduction of the patient's symptoms.  Abdomen:  Soft nontender  Extremities:  Warm and well-perfused  Diagnostic Tests:  CHEST 2 VIEW  COMPARISON: 05/16/2014.  FINDINGS: Mediastinum hilar structures normal. Prior CABG. Heart size normal. No pleural effusion or pneumothorax. No focal infiltrate. Median sternotomy wires are intact.  IMPRESSION: 1. Prior CABG. Median sternotomy wires intact. Stable cardiomegaly. No CHF. 2. No acute cardiopulmonary disease.   Electronically Signed  By: Marcello Moores Register  On: 07/18/2014 11:52   Impression:  Patient is doing very well 3 months status post coronary artery bypass grafting 2. He has some sensation of looking over the xiphoid process. The sternum itself is healed nicely. Chest radiograph demonstrates intact sternal wires and sternum.  Plan:  I reassured the patient that there  is nothing to be concerned about regarding his sternotomy incision. Risk factor modification as been discussed. The patient will continue to increase his activity as tolerated. He will return in 9 months for routine follow-up.  I spent in excess of 15 minutes during the conduct of this office consultation and >50% of this time involved direct face-to-face encounter with the patient for counseling and/or coordination of their care.   Valentina Gu. Roxy Manns, MD 07/18/2014 12:33 PM

## 2014-07-20 ENCOUNTER — Encounter (HOSPITAL_COMMUNITY): Payer: 59

## 2014-07-20 ENCOUNTER — Encounter: Payer: Self-pay | Admitting: Cardiology

## 2014-07-20 DIAGNOSIS — Z951 Presence of aortocoronary bypass graft: Secondary | ICD-10-CM | POA: Diagnosis not present

## 2014-07-22 ENCOUNTER — Encounter (HOSPITAL_COMMUNITY)
Admission: RE | Admit: 2014-07-22 | Discharge: 2014-07-22 | Disposition: A | Discharge status: Home / Self Care | Payer: 59 | Source: Ambulatory Visit | Attending: Cardiology | Admitting: Cardiology

## 2014-07-22 DIAGNOSIS — Z951 Presence of aortocoronary bypass graft: Secondary | ICD-10-CM | POA: Diagnosis not present

## 2014-07-25 ENCOUNTER — Encounter (HOSPITAL_COMMUNITY)
Admission: RE | Admit: 2014-07-25 | Discharge: 2014-07-25 | Disposition: A | Discharge status: Home / Self Care | Payer: 59 | Source: Ambulatory Visit | Attending: Cardiology | Admitting: Cardiology

## 2014-07-25 DIAGNOSIS — Z951 Presence of aortocoronary bypass graft: Secondary | ICD-10-CM | POA: Diagnosis not present

## 2014-07-27 ENCOUNTER — Encounter (HOSPITAL_COMMUNITY)
Admission: RE | Admit: 2014-07-27 | Discharge: 2014-07-27 | Disposition: A | Discharge status: Home / Self Care | Payer: 59 | Source: Ambulatory Visit | Attending: Cardiology | Admitting: Cardiology

## 2014-07-27 DIAGNOSIS — Z951 Presence of aortocoronary bypass graft: Secondary | ICD-10-CM | POA: Diagnosis not present

## 2014-07-29 ENCOUNTER — Encounter (HOSPITAL_COMMUNITY)
Admission: RE | Admit: 2014-07-29 | Discharge: 2014-07-29 | Disposition: A | Discharge status: Home / Self Care | Payer: 59 | Source: Ambulatory Visit | Attending: Cardiology | Admitting: Cardiology

## 2014-07-29 DIAGNOSIS — Z951 Presence of aortocoronary bypass graft: Secondary | ICD-10-CM | POA: Diagnosis not present

## 2014-08-01 ENCOUNTER — Encounter (HOSPITAL_COMMUNITY)
Admission: RE | Admit: 2014-08-01 | Discharge: 2014-08-01 | Disposition: A | Discharge status: Home / Self Care | Payer: 59 | Source: Ambulatory Visit | Attending: Cardiology | Admitting: Cardiology

## 2014-08-01 DIAGNOSIS — Z951 Presence of aortocoronary bypass graft: Secondary | ICD-10-CM | POA: Diagnosis not present

## 2014-08-03 ENCOUNTER — Encounter (HOSPITAL_COMMUNITY)
Admission: RE | Admit: 2014-08-03 | Discharge: 2014-08-03 | Disposition: A | Discharge status: Home / Self Care | Payer: 59 | Source: Ambulatory Visit | Attending: Cardiology | Admitting: Cardiology

## 2014-08-03 DIAGNOSIS — Z951 Presence of aortocoronary bypass graft: Secondary | ICD-10-CM | POA: Diagnosis not present

## 2014-08-05 ENCOUNTER — Encounter (HOSPITAL_COMMUNITY)
Admission: RE | Admit: 2014-08-05 | Discharge: 2014-08-05 | Disposition: A | Discharge status: Home / Self Care | Payer: 59 | Source: Ambulatory Visit | Attending: Cardiology | Admitting: Cardiology

## 2014-08-05 DIAGNOSIS — Z951 Presence of aortocoronary bypass graft: Secondary | ICD-10-CM | POA: Diagnosis not present

## 2014-08-08 ENCOUNTER — Encounter (HOSPITAL_COMMUNITY)
Admission: RE | Admit: 2014-08-08 | Discharge: 2014-08-08 | Disposition: A | Discharge status: Home / Self Care | Payer: 59 | Source: Ambulatory Visit | Attending: Cardiology | Admitting: Cardiology

## 2014-08-08 ENCOUNTER — Other Ambulatory Visit: Payer: Self-pay

## 2014-08-08 DIAGNOSIS — Z951 Presence of aortocoronary bypass graft: Secondary | ICD-10-CM | POA: Diagnosis not present

## 2014-08-08 MED ORDER — POTASSIUM CHLORIDE ER 10 MEQ PO TBCR: 10.0000 meq | EXTENDED_RELEASE_TABLET | Freq: Every day | ORAL | Status: DC

## 2014-08-08 MED ORDER — FUROSEMIDE 20 MG PO TABS: 20.0000 mg | ORAL_TABLET | Freq: Every day | ORAL | Status: DC

## 2014-08-08 MED ORDER — METOPROLOL TARTRATE 25 MG PO TABS: 25.0000 mg | ORAL_TABLET | Freq: Two times a day (BID) | ORAL | Status: DC

## 2014-08-10 ENCOUNTER — Encounter (HOSPITAL_COMMUNITY)
Admission: RE | Admit: 2014-08-10 | Discharge: 2014-08-10 | Disposition: A | Discharge status: Home / Self Care | Payer: 59 | Source: Ambulatory Visit | Attending: Cardiology | Admitting: Cardiology

## 2014-08-10 DIAGNOSIS — Z951 Presence of aortocoronary bypass graft: Secondary | ICD-10-CM | POA: Diagnosis not present

## 2014-08-15 ENCOUNTER — Encounter (HOSPITAL_COMMUNITY)
Admission: RE | Admit: 2014-08-15 | Discharge: 2014-08-15 | Disposition: A | Discharge status: Home / Self Care | Payer: 59 | Source: Ambulatory Visit | Attending: Cardiology | Admitting: Cardiology

## 2014-08-15 DIAGNOSIS — Z951 Presence of aortocoronary bypass graft: Secondary | ICD-10-CM | POA: Diagnosis not present

## 2014-08-16 ENCOUNTER — Telehealth: Payer: Self-pay | Admitting: Cardiology

## 2014-08-16 MED ORDER — METOPROLOL TARTRATE 25 MG PO TABS: 25.0000 mg | ORAL_TABLET | Freq: Two times a day (BID) | ORAL | Status: DC

## 2014-08-16 MED ORDER — METOPROLOL TARTRATE 25 MG PO TABS
25.0000 mg | ORAL_TABLET | Freq: Two times a day (BID) | ORAL | Status: DC
Start: 2014-08-16 — End: 2015-09-13

## 2014-08-16 NOTE — Telephone Encounter (Signed)
Left message for pt to call.

## 2014-08-16 NOTE — Telephone Encounter (Signed)
Spoke with pt, refills sent to the pharm[facility].

## 2014-08-16 NOTE — Telephone Encounter (Signed)
Please call,says he need to talk to you about his medicine. He said it was long and needs to explain this to you.

## 2014-08-17 ENCOUNTER — Encounter (HOSPITAL_COMMUNITY): Payer: 59

## 2014-08-18 ENCOUNTER — Encounter: Payer: Self-pay | Admitting: Family Medicine

## 2014-08-18 ENCOUNTER — Ambulatory Visit (INDEPENDENT_AMBULATORY_CARE_PROVIDER_SITE_OTHER): Payer: 59 | Admitting: Family Medicine

## 2014-08-18 VITALS — BP 120/74 | HR 60 | Ht 67.0 in | Wt 255.0 lb

## 2014-08-18 DIAGNOSIS — E1151 Type 2 diabetes mellitus with diabetic peripheral angiopathy without gangrene: Secondary | ICD-10-CM

## 2014-08-18 DIAGNOSIS — E785 Hyperlipidemia, unspecified: Secondary | ICD-10-CM

## 2014-08-18 DIAGNOSIS — E669 Obesity, unspecified: Secondary | ICD-10-CM

## 2014-08-18 DIAGNOSIS — E119 Type 2 diabetes mellitus without complications: Secondary | ICD-10-CM

## 2014-08-18 DIAGNOSIS — R7301 Impaired fasting glucose: Secondary | ICD-10-CM

## 2014-08-18 DIAGNOSIS — Z23 Encounter for immunization: Secondary | ICD-10-CM

## 2014-08-18 DIAGNOSIS — E1159 Type 2 diabetes mellitus with other circulatory complications: Secondary | ICD-10-CM

## 2014-08-18 DIAGNOSIS — I10 Essential (primary) hypertension: Secondary | ICD-10-CM

## 2014-08-18 LAB — POCT GLYCOSYLATED HEMOGLOBIN (HGB A1C): Hemoglobin A1C: 5.6

## 2014-08-18 NOTE — Patient Instructions (Signed)
Continue your exercise and weight loss. Your sugar remains excellent.

## 2014-08-18 NOTE — Progress Notes (Signed)
Chief Complaint  Patient presents with  . Diabetes    fasting med check. Would like prevnar 13 today of needed.    Patient presents to follow up on diabetes. He has lost 30 pounds, and has maintained this lower weight.  He has been following a diabetic diet.  He goes to cardiac rehab, and uses the fitness center, getting exercise 5x/week (and plans to continue 5x/week after rehab ends later this month). He was started on metformin in the hospital, but it was stopped when they never let him participate in rehab (or made him eat so much to get hissugar up, that he was gaining weight).  Never on medications prior to this hospitalization, only diagnosed 05/2013 based on A1c of 6.7, and dietary recommendations were made.  Hyperlipidemia:  He remains on a study drug, plus 80mg  of lipitor. He isn't having side effects or problems.  He spoke about his lipids with the cardiologist, and felt no harm in continuing on the 80mg  dose of lipitor.  CAD:  Doing well s/p CABG.  He gets up once in the middle of the night to void; he is aware of his heartbeat and feels some skipping only when he lays on his left side.  No associated chest pain or shortness of breath.  He has some shortness of breath with exertion, pretty typical for him, blames his weight, and resolves quickly.  Carotid artery stenosis:  Last u/s was 03/2014 and showed right is 50-69% and the left is 0-49%; this was unchanged, and recommended to repeat in 1 year.  Obesity:  He continues to lose weight at his waist (measured at rehab)--lost about 4 inches from waist so far.  PMH, PSH, SH reviewed  Outpatient Encounter Prescriptions as of 08/18/2014  Medication Sig Note  . acetaminophen (TYLENOL) 500 MG tablet Take 1,000 mg by mouth at bedtime as needed for mild pain.    Marland Kitchen aspirin EC 325 MG EC tablet Take 1 tablet (325 mg total) by mouth daily.   Marland Kitchen atorvastatin (LIPITOR) 80 MG tablet Take 80 mg by mouth every evening.   Marland Kitchen CALCIUM-MAGNESIUM-ZINC PO  Take 1 each by mouth daily.   . Cholecalciferol (VITAMIN D) 2000 UNITS tablet Take 2,000 Units by mouth daily.   . Coenzyme Q10 (CO Q-10) 100 MG CAPS Take 1 capsule by mouth daily.   . fluticasone (FLONASE) 50 MCG/ACT nasal spray USE 2 SPRAYS INTO THE NOSE DAILY   . furosemide (LASIX) 20 MG tablet Take 1 tablet (20 mg total) by mouth daily.   Marland Kitchen loratadine (CLARITIN) 10 MG tablet Take 10 mg by mouth daily.    . metoprolol tartrate (LOPRESSOR) 25 MG tablet Take 1 tablet (25 mg total) by mouth 2 (two) times daily.   Marland Kitchen omeprazole (PRILOSEC) 20 MG capsule Take 20 mg by mouth every other day.    . potassium chloride (KLOR-CON 10) 10 MEQ tablet Take 1 tablet (10 mEq total) by mouth daily.   . STUDY MEDICATION Take 1 tablet by mouth every evening. [facility] for cholesterol. Study medication Anacetrapib or placebo   . testosterone cypionate (DEPOTESTOTERONE CYPIONATE) 200 MG/ML injection Inject 200 mg into the muscle every 14 (fourteen) days.  04/01/2014: Received from: External Pharmacy  . glucose blood test strip Use as instructed (Patient not taking: Reported on 08/18/2014)   he continues on study drug for cholesterol  No Known Allergies  ROS:  Denies headaches, dizziness, neurologic symptoms, chest pain, edema, URI symptoms, cough, bleeding, bruising, rash, depression  or other complaints.  PHYSICAL EXAM: BP 120/74 mmHg  Pulse 60  Ht 5\' 7"  (1.702 m)  Wt 255 lb (115.667 kg)  BMI 39.93 kg/m2  Well developed, pleasant, obese male in good spirits, in no distress HEENT: PERRL, EOMI, conjunctiva clear.  Neck: no lymphadenopathy, thyromegaly or mass Heart: regular rate and rhythm Lungs: clear bilaterallly Back: no CVA tenderness Abdomen: soft, obese, nontender, no mass Extremities: trace edema, normal pulses Psych: normal mood, affect, hygiene and grooming Neuro: alert and oriented. Normal strength, gait, cranial nerves  Lab Results  Component Value Date   HGBA1C 5.6 08/18/2014  (down  from 6.5 in July)  Lab Results  Component Value Date   CHOL 85 04/14/2014   HDL 33* 04/14/2014   LDLCALC 29 04/14/2014   TRIG 114 04/14/2014   CHOLHDL 2.6 04/14/2014     Chemistry      Component Value Date/Time   NA 140 04/18/2014 0450   K 4.5 04/18/2014 0450   CL 101 04/18/2014 0450   CO2 28 04/18/2014 0450   BUN 16 04/18/2014 0450   CREATININE 0.97 04/18/2014 0450   CREATININE 0.99 04/12/2014 1051      Component Value Date/Time   CALCIUM 8.3* 04/18/2014 0450   ALKPHOS 37* 04/14/2014 0303   AST 25 04/14/2014 0303   ALT 27 04/14/2014 0303   BILITOT 0.8 04/14/2014 0303       ASSESSMENT/PLAN:  Diabetes type 2, controlled - significantly improved with weight loss and dietary changes.  Continue weight loss, exercise, and proper diet.    Immunization due - Plan: Pneumococcal conjugate vaccine 13-valent  DM (diabetes mellitus), type 2 with peripheral vascular complications  IFG (impaired fasting glucose) - Plan: HgB A1c  Obesity - encouraged continued weight loss/inches lost at waist  Essential hypertension - controlled  Hyperlipidemia - at goal.  LDL very low, but no harm in that.  continue current regimen   Prevnar-13 given.

## 2014-08-19 ENCOUNTER — Encounter (HOSPITAL_COMMUNITY)
Admission: RE | Admit: 2014-08-19 | Discharge: 2014-08-19 | Disposition: A | Discharge status: Home / Self Care | Payer: 59 | Source: Ambulatory Visit | Attending: Cardiology | Admitting: Cardiology

## 2014-08-19 DIAGNOSIS — Z951 Presence of aortocoronary bypass graft: Secondary | ICD-10-CM | POA: Diagnosis present

## 2014-08-19 DIAGNOSIS — E1151 Type 2 diabetes mellitus with diabetic peripheral angiopathy without gangrene: Secondary | ICD-10-CM | POA: Insufficient documentation

## 2014-08-22 ENCOUNTER — Encounter (HOSPITAL_COMMUNITY)
Admission: RE | Admit: 2014-08-22 | Discharge: 2014-08-22 | Disposition: A | Discharge status: Home / Self Care | Payer: 59 | Source: Ambulatory Visit | Attending: Cardiology | Admitting: Cardiology

## 2014-08-22 DIAGNOSIS — Z951 Presence of aortocoronary bypass graft: Secondary | ICD-10-CM | POA: Diagnosis not present

## 2014-08-24 ENCOUNTER — Ambulatory Visit (INDEPENDENT_AMBULATORY_CARE_PROVIDER_SITE_OTHER): Payer: 59 | Admitting: Family Medicine

## 2014-08-24 ENCOUNTER — Ambulatory Visit: Payer: 59 | Admitting: Family Medicine

## 2014-08-24 ENCOUNTER — Encounter: Payer: Self-pay | Admitting: Family Medicine

## 2014-08-24 ENCOUNTER — Encounter (HOSPITAL_COMMUNITY)
Admission: RE | Admit: 2014-08-24 | Discharge: 2014-08-24 | Disposition: A | Discharge status: Home / Self Care | Payer: 59 | Source: Ambulatory Visit | Attending: Cardiology | Admitting: Cardiology

## 2014-08-24 VITALS — BP 132/78 | HR 72 | Temp 98.8°F | Ht 67.0 in | Wt 254.0 lb

## 2014-08-24 DIAGNOSIS — Z951 Presence of aortocoronary bypass graft: Secondary | ICD-10-CM | POA: Diagnosis not present

## 2014-08-24 DIAGNOSIS — R05 Cough: Secondary | ICD-10-CM

## 2014-08-24 DIAGNOSIS — R059 Cough, unspecified: Secondary | ICD-10-CM

## 2014-08-24 DIAGNOSIS — J069 Acute upper respiratory infection, unspecified: Secondary | ICD-10-CM

## 2014-08-24 MED ORDER — AMOXICILLIN 875 MG PO TABS: 875.0000 mg | ORAL_TABLET | Freq: Two times a day (BID) | ORAL | Status: DC

## 2014-08-24 MED ORDER — BENZONATATE 200 MG PO CAPS: 200.0000 mg | ORAL_CAPSULE | Freq: Three times a day (TID) | ORAL | Status: DC | PRN

## 2014-08-24 NOTE — Patient Instructions (Signed)
  Drink plenty of fluid. Use Mucinex (plain--containing only guaifenesin)--an expectorant to keep the mucus/phlegm thin. Use the tessalon as needed for cough. If you develop fever, or if the mucus remains discolored (not thinning and getting lighter, but continues to stay dark and discolored), then start the antibiotics and finish the whole course.

## 2014-08-24 NOTE — Progress Notes (Signed)
Chief Complaint  Patient presents with  . Cough    and congestion since evening of 12/3. Has had some fevers-mucus is now yellow in color.     He started with runny nose after leaving the office on 12/3, then started with postnasal drainage.  It got "pretty bad" for a couple of days.  The nasal drainage has continued to be clear, but he is now getting up discolored phlegm.  It changed from clear to whitish, and is now yellow (not just in am).  Coughing mostly in the morning, and after 6 in the evening.  Phlegm is slightly thick.  He isn't having any chest congestion or shortness of breath. He was able to exercise at rehab without any problems.  He hasn't been using OTC meds. He hasn't checked his temperature, but has had tactile low-grade fevers. No known sick contacts.  He received Prevnar on 12/3.  PMH, PSH, SH reviewed  Outpatient Encounter Prescriptions as of 08/24/2014  Medication Sig Note  . acetaminophen (TYLENOL) 500 MG tablet Take 1,000 mg by mouth at bedtime as needed for mild pain.    Marland Kitchen aspirin EC 325 MG EC tablet Take 1 tablet (325 mg total) by mouth daily.   Marland Kitchen atorvastatin (LIPITOR) 80 MG tablet Take 80 mg by mouth every evening.   Marland Kitchen CALCIUM-MAGNESIUM-ZINC PO Take 1 each by mouth daily.   . Cholecalciferol (VITAMIN D) 2000 UNITS tablet Take 2,000 Units by mouth daily.   . Coenzyme Q10 (CO Q-10) 100 MG CAPS Take 1 capsule by mouth daily.   . fluticasone (FLONASE) 50 MCG/ACT nasal spray USE 2 SPRAYS INTO THE NOSE DAILY   . furosemide (LASIX) 20 MG tablet Take 1 tablet (20 mg total) by mouth daily.   Marland Kitchen glucose blood test strip Use as instructed   . loratadine (CLARITIN) 10 MG tablet Take 10 mg by mouth daily.    . metoprolol tartrate (LOPRESSOR) 25 MG tablet Take 1 tablet (25 mg total) by mouth 2 (two) times daily.   Marland Kitchen omeprazole (PRILOSEC) 20 MG capsule Take 20 mg by mouth every other day.    . potassium chloride (KLOR-CON 10) 10 MEQ tablet Take 1 tablet (10 mEq total) by  mouth daily.   . STUDY MEDICATION Take 1 tablet by mouth every evening. [facility] for cholesterol. Study medication Anacetrapib or placebo   . testosterone cypionate (DEPOTESTOTERONE CYPIONATE) 200 MG/ML injection Inject 200 mg into the muscle every 14 (fourteen) days.  04/01/2014: Received from: External Pharmacy   No Known Allergies Doesn't like delsym--makes him feel horrible, troubles with his prostate.  ROS:  No nausea, vomiting, diarrhea, rashes, urinary complaints. No headaches, dizziness, chest pain.  +URI symptoms per HPI.  PHYSICAL EXAM: BP 132/78 mmHg  Pulse 72  Temp(Src) 98.8 F (37.1 C) (Tympanic)  Ht 5\' 7"  (1.702 m)  Wt 254 lb (115.214 kg)  BMI 39.77 kg/m2 Well developed, well-appearing pleasant male in no distress.  Occasionally blowing nose and dry cough. HEENT: PERRL, EOMI, conjunctiva clear. TM's and EAC's normal. Nasal mucosa is normal, no purulence.  Sinuses are nontender.  OP is clear Neck: no lymphadenopathy or mass Heart: regular rate and rhythm Lungs: clear bilaterally.  No wheezes, rales, ronchi. Good air movement Skin: no rashes Neuro: alert and oriented. Cranial nerves intact. Normal gait, strength Psych: normal mood, affect, hygiene and grooming  ASSESSMENT/PLAN:  Acute upper respiratory infection - Plan: amoxicillin (AMOXIL) 875 MG tablet  Cough - Plan: benzonatate (TESSALON) 200 MG capsule  Drink plenty of fluid. Use Mucinex (plain--containing only guaifenesin)--an expectorant to keep the mucus/phlegm thin. Use the tessalon as needed for cough. If you develop fever, or if the mucus remains discolored (not thinning and getting lighter, but continues to stay dark and discolored), then start the antibiotics and finish the whole course.

## 2014-08-25 ENCOUNTER — Encounter (HOSPITAL_COMMUNITY): Payer: Self-pay | Admitting: Interventional Cardiology

## 2014-08-25 NOTE — Progress Notes (Signed)
Pt will graduate from the cardiac rehab phase II program with the completion of 36 visits on 08/26/14.  Pt good excellent attendance to exercise and education classes.  Pt showed measurable growth with met level progression.  Pt met level in September 2.3 to 3.9  inDecember.  Medication list reconciled.  Pt verbalizes compliance to medication and denies any barriers.  Repeat PHQ2 score 0.  Pt feels he has adequate support system and demonstrates appropriate and healthy coping skills.  Pt has a positive outlook on life and feels he is "making strides" every day.  Pt plans to continue home exercise at local gym where he currently exercises on his "off" days from cardiac rehab.  Based upon his attendance and activity progression along with his commitment to exercise on his off days  pt will be successful in this.  Pt met his short term goal to increase strength.  Pt has met this based upon his report of activities he has been able to perform at home.   Pt long term goal is to get back to the gym which pt reports he was able to do so early in his participation in cardiac reahb.  Pt is most pleased with his latest HGA1C results which showed a significant decrease to "5.7".  It was a pleasure to have pt participate ing cardiac rehab. Carlette Carlton RN, BSN  

## 2014-08-26 ENCOUNTER — Encounter (HOSPITAL_COMMUNITY)
Admission: RE | Admit: 2014-08-26 | Discharge: 2014-08-26 | Disposition: A | Discharge status: Home / Self Care | Payer: 59 | Source: Ambulatory Visit | Attending: Cardiology | Admitting: Cardiology

## 2014-08-26 DIAGNOSIS — Z951 Presence of aortocoronary bypass graft: Secondary | ICD-10-CM | POA: Diagnosis not present

## 2014-08-29 ENCOUNTER — Encounter (HOSPITAL_COMMUNITY): Payer: 59

## 2014-08-31 ENCOUNTER — Encounter (HOSPITAL_COMMUNITY): Payer: 59

## 2014-09-02 ENCOUNTER — Encounter (HOSPITAL_COMMUNITY): Payer: 59

## 2014-09-05 ENCOUNTER — Encounter (HOSPITAL_COMMUNITY): Payer: 59

## 2014-09-07 ENCOUNTER — Encounter (HOSPITAL_COMMUNITY): Payer: 59

## 2014-09-12 ENCOUNTER — Encounter (HOSPITAL_COMMUNITY): Payer: 59

## 2014-09-14 ENCOUNTER — Encounter (HOSPITAL_COMMUNITY): Payer: 59

## 2014-09-16 DIAGNOSIS — I639 Cerebral infarction, unspecified: Secondary | ICD-10-CM

## 2014-09-16 HISTORY — DX: Cerebral infarction, unspecified: I63.9

## 2014-09-19 ENCOUNTER — Encounter (HOSPITAL_COMMUNITY): Payer: 59

## 2014-09-21 ENCOUNTER — Encounter (HOSPITAL_COMMUNITY): Payer: 59

## 2014-09-23 ENCOUNTER — Encounter (HOSPITAL_COMMUNITY): Payer: 59

## 2014-09-24 ENCOUNTER — Encounter (HOSPITAL_COMMUNITY): Payer: Self-pay

## 2014-09-24 ENCOUNTER — Inpatient Hospital Stay (HOSPITAL_COMMUNITY): Payer: 59

## 2014-09-24 ENCOUNTER — Emergency Department (HOSPITAL_COMMUNITY): Payer: 59

## 2014-09-24 ENCOUNTER — Inpatient Hospital Stay (HOSPITAL_COMMUNITY)
Admission: EM | Admit: 2014-09-24 | Discharge: 2014-09-27 | DRG: 065 | Disposition: A | Discharge status: Home / Self Care | Payer: 59 | Attending: Internal Medicine | Admitting: Internal Medicine

## 2014-09-24 DIAGNOSIS — K759 Inflammatory liver disease, unspecified: Secondary | ICD-10-CM | POA: Diagnosis present

## 2014-09-24 DIAGNOSIS — G4733 Obstructive sleep apnea (adult) (pediatric): Secondary | ICD-10-CM | POA: Diagnosis not present

## 2014-09-24 DIAGNOSIS — Z951 Presence of aortocoronary bypass graft: Secondary | ICD-10-CM | POA: Diagnosis not present

## 2014-09-24 DIAGNOSIS — R404 Transient alteration of awareness: Secondary | ICD-10-CM | POA: Diagnosis not present

## 2014-09-24 DIAGNOSIS — Z6841 Body Mass Index (BMI) 40.0 and over, adult: Secondary | ICD-10-CM

## 2014-09-24 DIAGNOSIS — N529 Male erectile dysfunction, unspecified: Secondary | ICD-10-CM | POA: Diagnosis present

## 2014-09-24 DIAGNOSIS — Z823 Family history of stroke: Secondary | ICD-10-CM

## 2014-09-24 DIAGNOSIS — N400 Enlarged prostate without lower urinary tract symptoms: Secondary | ICD-10-CM | POA: Diagnosis present

## 2014-09-24 DIAGNOSIS — I70209 Unspecified atherosclerosis of native arteries of extremities, unspecified extremity: Secondary | ICD-10-CM

## 2014-09-24 DIAGNOSIS — H5461 Unqualified visual loss, right eye, normal vision left eye: Secondary | ICD-10-CM | POA: Diagnosis present

## 2014-09-24 DIAGNOSIS — I25810 Atherosclerosis of coronary artery bypass graft(s) without angina pectoris: Secondary | ICD-10-CM | POA: Diagnosis present

## 2014-09-24 DIAGNOSIS — Z87891 Personal history of nicotine dependence: Secondary | ICD-10-CM

## 2014-09-24 DIAGNOSIS — H9193 Unspecified hearing loss, bilateral: Secondary | ICD-10-CM | POA: Diagnosis present

## 2014-09-24 DIAGNOSIS — Z96653 Presence of artificial knee joint, bilateral: Secondary | ICD-10-CM | POA: Diagnosis present

## 2014-09-24 DIAGNOSIS — I2510 Atherosclerotic heart disease of native coronary artery without angina pectoris: Secondary | ICD-10-CM | POA: Diagnosis not present

## 2014-09-24 DIAGNOSIS — Z8249 Family history of ischemic heart disease and other diseases of the circulatory system: Secondary | ICD-10-CM | POA: Diagnosis not present

## 2014-09-24 DIAGNOSIS — E1151 Type 2 diabetes mellitus with diabetic peripheral angiopathy without gangrene: Secondary | ICD-10-CM | POA: Diagnosis present

## 2014-09-24 DIAGNOSIS — E669 Obesity, unspecified: Secondary | ICD-10-CM | POA: Diagnosis present

## 2014-09-24 DIAGNOSIS — R42 Dizziness and giddiness: Secondary | ICD-10-CM | POA: Diagnosis not present

## 2014-09-24 DIAGNOSIS — I25729 Atherosclerosis of autologous artery coronary artery bypass graft(s) with unspecified angina pectoris: Secondary | ICD-10-CM | POA: Insufficient documentation

## 2014-09-24 DIAGNOSIS — I252 Old myocardial infarction: Secondary | ICD-10-CM | POA: Diagnosis not present

## 2014-09-24 DIAGNOSIS — I639 Cerebral infarction, unspecified: Secondary | ICD-10-CM | POA: Diagnosis present

## 2014-09-24 DIAGNOSIS — K219 Gastro-esophageal reflux disease without esophagitis: Secondary | ICD-10-CM | POA: Diagnosis present

## 2014-09-24 DIAGNOSIS — I63432 Cerebral infarction due to embolism of left posterior cerebral artery: Secondary | ICD-10-CM

## 2014-09-24 DIAGNOSIS — I10 Essential (primary) hypertension: Secondary | ICD-10-CM | POA: Diagnosis not present

## 2014-09-24 DIAGNOSIS — H539 Unspecified visual disturbance: Secondary | ICD-10-CM | POA: Diagnosis present

## 2014-09-24 DIAGNOSIS — I63112 Cerebral infarction due to embolism of left vertebral artery: Secondary | ICD-10-CM | POA: Diagnosis not present

## 2014-09-24 DIAGNOSIS — I63012 Cerebral infarction due to thrombosis of left vertebral artery: Secondary | ICD-10-CM | POA: Diagnosis not present

## 2014-09-24 DIAGNOSIS — I6521 Occlusion and stenosis of right carotid artery: Secondary | ICD-10-CM | POA: Diagnosis present

## 2014-09-24 DIAGNOSIS — M1990 Unspecified osteoarthritis, unspecified site: Secondary | ICD-10-CM | POA: Diagnosis present

## 2014-09-24 DIAGNOSIS — K5790 Diverticulosis of intestine, part unspecified, without perforation or abscess without bleeding: Secondary | ICD-10-CM | POA: Diagnosis present

## 2014-09-24 DIAGNOSIS — E291 Testicular hypofunction: Secondary | ICD-10-CM | POA: Diagnosis present

## 2014-09-24 DIAGNOSIS — Z79899 Other long term (current) drug therapy: Secondary | ICD-10-CM

## 2014-09-24 DIAGNOSIS — I4891 Unspecified atrial fibrillation: Secondary | ICD-10-CM | POA: Diagnosis not present

## 2014-09-24 DIAGNOSIS — I4892 Unspecified atrial flutter: Secondary | ICD-10-CM | POA: Diagnosis not present

## 2014-09-24 DIAGNOSIS — Z9079 Acquired absence of other genital organ(s): Secondary | ICD-10-CM | POA: Diagnosis present

## 2014-09-24 DIAGNOSIS — E785 Hyperlipidemia, unspecified: Secondary | ICD-10-CM | POA: Diagnosis not present

## 2014-09-24 DIAGNOSIS — Z955 Presence of coronary angioplasty implant and graft: Secondary | ICD-10-CM

## 2014-09-24 DIAGNOSIS — Z7982 Long term (current) use of aspirin: Secondary | ICD-10-CM

## 2014-09-24 LAB — URINALYSIS, ROUTINE W REFLEX MICROSCOPIC
BILIRUBIN URINE: NEGATIVE
GLUCOSE, UA: NEGATIVE mg/dL
Hgb urine dipstick: NEGATIVE
KETONES UR: NEGATIVE mg/dL
LEUKOCYTES UA: NEGATIVE
NITRITE: NEGATIVE
PH: 6 (ref 5.0–8.0)
Protein, ur: NEGATIVE mg/dL
SPECIFIC GRAVITY, URINE: 1.019 (ref 1.005–1.030)
Urobilinogen, UA: 0.2 mg/dL (ref 0.0–1.0)

## 2014-09-24 LAB — CBC
HCT: 40 % (ref 39.0–52.0)
HEMOGLOBIN: 11.4 g/dL — AB (ref 13.0–17.0)
MCH: 20.6 pg — ABNORMAL LOW (ref 26.0–34.0)
MCHC: 28.5 g/dL — ABNORMAL LOW (ref 30.0–36.0)
MCV: 72.3 fL — AB (ref 78.0–100.0)
Platelets: 260 10*3/uL (ref 150–400)
RBC: 5.53 MIL/uL (ref 4.22–5.81)
RDW: 18.9 % — ABNORMAL HIGH (ref 11.5–15.5)
WBC: 12.8 10*3/uL — ABNORMAL HIGH (ref 4.0–10.5)

## 2014-09-24 LAB — I-STAT CHEM 8, ED
BUN: 15 mg/dL (ref 6–23)
CALCIUM ION: 1.09 mmol/L — AB (ref 1.13–1.30)
Chloride: 101 mEq/L (ref 96–112)
Creatinine, Ser: 0.9 mg/dL (ref 0.50–1.35)
Glucose, Bld: 126 mg/dL — ABNORMAL HIGH (ref 70–99)
HEMATOCRIT: 44 % (ref 39.0–52.0)
Hemoglobin: 15 g/dL (ref 13.0–17.0)
Potassium: 3.8 mmol/L (ref 3.5–5.1)
SODIUM: 139 mmol/L (ref 135–145)
TCO2: 21 mmol/L (ref 0–100)

## 2014-09-24 LAB — DIFFERENTIAL
Basophils Absolute: 0 10*3/uL (ref 0.0–0.1)
Basophils Relative: 0 % (ref 0–1)
Eosinophils Absolute: 0.2 10*3/uL (ref 0.0–0.7)
Eosinophils Relative: 1 % (ref 0–5)
LYMPHS PCT: 8 % — AB (ref 12–46)
Lymphs Abs: 1 10*3/uL (ref 0.7–4.0)
Monocytes Absolute: 0.7 10*3/uL (ref 0.1–1.0)
Monocytes Relative: 5 % (ref 3–12)
Neutro Abs: 10.9 10*3/uL — ABNORMAL HIGH (ref 1.7–7.7)
Neutrophils Relative %: 85 % — ABNORMAL HIGH (ref 43–77)

## 2014-09-24 LAB — I-STAT TROPONIN, ED: Troponin i, poc: 0 ng/mL (ref 0.00–0.08)

## 2014-09-24 LAB — COMPREHENSIVE METABOLIC PANEL
ALT: 19 U/L (ref 0–53)
AST: 23 U/L (ref 0–37)
Albumin: 3.9 g/dL (ref 3.5–5.2)
Alkaline Phosphatase: 44 U/L (ref 39–117)
Anion gap: 9 (ref 5–15)
BUN: 13 mg/dL (ref 6–23)
CALCIUM: 8.8 mg/dL (ref 8.4–10.5)
CHLORIDE: 104 meq/L (ref 96–112)
CO2: 24 mmol/L (ref 19–32)
Creatinine, Ser: 0.92 mg/dL (ref 0.50–1.35)
GFR calc Af Amer: >90 mL/min (ref >90)
GFR calc non Af Amer: 82 mL/min — ABNORMAL LOW (ref >90)
GLUCOSE: 123 mg/dL — AB (ref 70–99)
Potassium: 3.9 mmol/L (ref 3.5–5.1)
Sodium: 137 mmol/L (ref 135–145)
TOTAL PROTEIN: 7.2 g/dL (ref 6.0–8.3)
Total Bilirubin: 0.6 mg/dL (ref 0.3–1.2)

## 2014-09-24 LAB — PROTIME-INR
INR: 1.07 (ref 0.00–1.49)
Prothrombin Time: 14 seconds (ref 11.6–15.2)

## 2014-09-24 LAB — RAPID URINE DRUG SCREEN, HOSP PERFORMED
Amphetamines: NOT DETECTED
BENZODIAZEPINES: NOT DETECTED
Barbiturates: NOT DETECTED
Cocaine: NOT DETECTED
Opiates: NOT DETECTED
TETRAHYDROCANNABINOL: NOT DETECTED

## 2014-09-24 LAB — ETHANOL

## 2014-09-24 LAB — APTT: aPTT: 32 seconds (ref 24–37)

## 2014-09-24 MED ORDER — SENNOSIDES-DOCUSATE SODIUM 8.6-50 MG PO TABS: 1.0000 | ORAL_TABLET | Freq: Every evening | ORAL | Status: DC | PRN

## 2014-09-24 MED ORDER — METOPROLOL TARTRATE 25 MG PO TABS
25.0000 mg | ORAL_TABLET | Freq: Two times a day (BID) | ORAL | Status: DC
Start: 2014-09-24 — End: 2014-09-25
  Administered 2014-09-24 – 2014-09-25 (×2): 25 mg via ORAL
  Filled 2014-09-24 (×3): qty 1

## 2014-09-24 MED ORDER — CLOPIDOGREL BISULFATE 75 MG PO TABS
75.0000 mg | ORAL_TABLET | Freq: Every day | ORAL | Status: DC
  Administered 2014-09-24 – 2014-09-26 (×3): 75 mg via ORAL
  Filled 2014-09-24 (×3): qty 1

## 2014-09-24 MED ORDER — STROKE: EARLY STAGES OF RECOVERY BOOK
Freq: Once | Status: AC
Start: 2014-09-24 — End: 2014-09-24
  Administered 2014-09-24: 18:00:00
  Filled 2014-09-24: qty 1

## 2014-09-24 MED ORDER — ENOXAPARIN SODIUM 40 MG/0.4ML ~~LOC~~ SOLN
40.0000 mg | SUBCUTANEOUS | Status: DC
  Administered 2014-09-24 – 2014-09-25 (×2): 40 mg via SUBCUTANEOUS
  Filled 2014-09-24 (×2): qty 0.4

## 2014-09-24 MED ORDER — ASPIRIN 325 MG PO TABS
325.0000 mg | ORAL_TABLET | Freq: Every day | ORAL | Status: DC
  Administered 2014-09-24 – 2014-09-26 (×3): 325 mg via ORAL
  Filled 2014-09-24 (×3): qty 1

## 2014-09-24 MED ORDER — ASPIRIN 300 MG RE SUPP: 300.0000 mg | Freq: Every day | RECTAL | Status: DC

## 2014-09-24 NOTE — H&P (Signed)
[facility] History and Physical  JATAVIAN CALICA ZJI:[id number] DOB: 1941-01-14 DOA: 09/24/2014  Referring physician:  PCP: [name], MD  Specialists:   Chief Complaint: Lightheadedness/headache  HPI: Jeffrey Wiggins is a 74 y.o. 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 Presented to the emergency room with complaints of acute lightheadedness associated with a mild headache and visual disturbance. The symptoms occurred at approximately 10:30. The patient suddenly felt very cool and clammy. He also felt dizzy and lightheaded. He denies any trouble with any chest pain or shortness of breath. Symptoms are getting better although not completely resolved. Now the patient is also noticing denies having some trouble with his vision.   Review of Systems: The patient denies anorexia, fever, weight loss,,decreased hearing, hoarseness, chest pain, syncope, dyspnea on exertion, peripheral edema, balance deficits, hemoptysis, abdominal pain, melena, hematochezia, severe indigestion/heartburn, hematuria, incontinence, genital sores, muscle weakness, suspicious skin lesions, difficulty walking, depression, unusual weight change, abnormal bleeding, enlarged lymph nodes, angioedema, and breast masses.    TRAVEL HISTORY:    Consultants:  Dr. Jim Like (neurology)   Procedure/Significant Events:  1/9 CT head without contrast; Mild atrophy and microvascular ischemic disease without acute intracranial process 1/9 MRI/MRA brain without contrast;-Small, acute left occipital lobe infarcts. - Mild chronic small vessel ischemic disease. -No evidence of major intracranial arterial occlusion or significant proximal stenosis.   Culture   NA  Antibiotics:  NA   DVT prophylaxis:  L ovenox  Devices  NA  LINES / TUBES:     Past Medical History  Diagnosis Date  . CAD (coronary artery disease)   . HTN (hypertension)   . Prostatitis   . Hyperlipidemia    . Obesity   . Diverticular disease   . Gallstones   . Hepatitis age 70  . Arthritis   . Sleep apnea     mild, CPAP wasn't indicated (per pt)--2014 mod-severe, CPAP rec  . Benign prostatic hypertrophy     followed by Dr. Gaynelle Arabian  . Cerebrovascular disease   . Sepsis due to Escherichia coli ? 2011    DUE TO URINARY OUTLET OBSTRUCTION /INFECTION  . UTI (urinary tract infection)     secondary  . Allergy     RHINITIS  . 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 Stanford Breed); s/p stents  . GERD (gastroesophageal reflux disease)   . Hypogonadism male     treated by Dr. Gaynelle Arabian  . Erectile dysfunction   . Peripheral vascular disease     02/28/12 MOST RECENT CAROTID DUPLEX--"STABLE MILD CALCIFIED PLAQUE, BILATERALY, STABLE, OVER SERIAL EXAMS"  . 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 IFG (impaired fasting glucose)   . Pneumonia     hx of  . 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 Total knee arthroplasty  L 4/06, R 4/07  . Vasectomy    . Tonsillectomy    . Transurethral resection of prostate  06/2010  . Knee surgery  05/2010    removing scar tissue R knee (Dr. Wynelle Link)  . Coronary angioplasty with stent placement  01/1997    PTCA/stent to RCA  AND 2009 2ND STENT PLACED  . Joint replacement      both knees  . Total knee revision  09/23/2012    Procedure: TOTAL KNEE REVISION;  Surgeon: Gearlean Alf, MD;  Location: [facility];  Service: Orthopedics;  Laterality: Right;  . Coronary artery bypass graft N/A 04/15/2014    Procedure: CORONARY ARTERY BYPASS GRAFTING TIMES TWO USEING LEFT INTERNAL MAMMARY ARTERY AND RIGHT GREATER SAPHENOUS VEIN VIA ENDOVEIN HARVEST;  Surgeon: Rexene Alberts, MD;  Location: [facility];  Service: Open Heart Surgery;  Laterality: N/A;  . Intraoperative transesophageal echocardiogram N/A 04/15/2014    Procedure: INTRAOPERATIVE TRANSESOPHAGEAL ECHOCARDIOGRAM;  Surgeon:  Rexene Alberts, MD;  Location: [place];  Service: Open Heart Surgery;  Laterality: N/A;  . Left and right heart catheterization with coronary angiogram  04/13/2014    Procedure: LEFT AND RIGHT HEART CATHETERIZATION WITH CORONARY ANGIOGRAM;  Surgeon: Sinclair Grooms, MD;  Location: [facility];  Service: Cardiovascular;;   Social History:  reports that he quit smoking about 28 years ago. His smoking use included Cigarettes. He has a 45 pack-year smoking history. He has never used smokeless tobacco. He reports that he drinks alcohol. He reports that he does not use illicit drugs. Where does patient live--home, ALF, with wife Can patient participate in ADLs? Yes  No Known Allergies  Family History  Problem Relation Age of Onset  . Coronary artery disease Mother     deceased  . Dementia Mother   . Atrial fibrillation Mother   . Stroke Mother     related to atrial fib  . Coronary artery disease Father     deceased  . Hypertension Father   . Heart disease Father   . Cancer Paternal Uncle     colon; died in his 46's  . Colon cancer Paternal Uncle   . Hypertension Paternal Grandmother   . Heart disease Paternal Grandmother   . Hypertension Paternal Grandfather   . Heart disease Paternal Grandfather   . Diabetes Neg Hx       Prior to Admission medications   Medication Sig Start Date End Date Taking? Authorizing Provider  acetaminophen (TYLENOL) 500 MG tablet Take 1,000 mg by mouth at bedtime as needed for mild pain.    Yes Historical Provider, MD  aspirin EC 325 MG EC tablet Take 1 tablet (325 mg total) by mouth daily. 04/19/14  Yes Erin Barrett, PA-C  atorvastatin (LIPITOR) 80 MG tablet Take 80 mg by mouth every evening.   Yes Historical Provider, MD  CALCIUM-MAGNESIUM-ZINC PO Take 1 each by mouth daily.   Yes Historical Provider, MD  Cholecalciferol (VITAMIN D) 2000 UNITS tablet Take 2,000 Units by mouth daily.   Yes Historical Provider, MD  Coenzyme Q10 (CO Q-10) 100 MG CAPS Take 1  capsule by mouth daily.   Yes Historical Provider, MD  fluticasone (FLONASE) 50 MCG/ACT nasal spray USE 2 SPRAYS INTO THE NOSE DAILY 05/13/14  Yes Rita Ohara, MD  furosemide (LASIX) 20 MG tablet Take 1 tablet (20 mg total) by mouth daily. 08/08/14  Yes Lelon Perla, MD  loratadine (CLARITIN) 10 MG tablet Take 10 mg by mouth daily.    Yes Historical Provider, MD  metoprolol tartrate (LOPRESSOR) 25 MG tablet Take 1 tablet (25 mg total) by mouth 2 (two) times daily. 08/16/14  Yes Lelon Perla, MD  NITROSTAT 0.4 MG SL tablet Place 0.4 mg under the tongue every 5 (five) minutes as needed for chest pain.  07/12/14  Yes Historical Provider, MD  omeprazole (PRILOSEC) 20 MG capsule Take 20 mg by mouth every other day.    Yes Historical Provider, MD  potassium chloride (KLOR-CON 10)  10 MEQ tablet Take 1 tablet (10 mEq total) by mouth daily. 08/08/14  Yes Lelon Perla, MD  STUDY MEDICATION Take 1 tablet by mouth every evening. [facility] for cholesterol. Study medication Anacetrapib or placebo   Yes Historical Provider, MD  testosterone cypionate (DEPOTESTOTERONE CYPIONATE) 200 MG/ML injection Inject 200 mg into the muscle every 14 (fourteen) days.  01/12/14  Yes Historical Provider, MD  amoxicillin (AMOXIL) 875 MG tablet Take 1 tablet (875 mg total) by mouth 2 (two) times daily. Patient not taking: Reported on 09/24/2014 08/24/14   Rita Ohara, MD  benzonatate (TESSALON) 200 MG capsule Take 1 capsule (200 mg total) by mouth 3 (three) times daily as needed. Patient not taking: Reported on 09/24/2014 08/24/14   Rita Ohara, MD   Physical Exam: Filed Vitals:   09/24/14 1530 09/24/14 1545 09/24/14 1658 09/24/14 1730  BP: 135/71 148/77  129/65  Pulse: 78 87  66  Temp:   98.8 F (37.1 C) 98.3 F (36.8 C)  TempSrc:    Oral  Resp: 18 25  22   Height:    5\' 7"  (1.702 m)  Weight:    118.48 kg (261 lb 3.2 oz)  SpO2: 92% 98%  100%     General:  A/O 4, NAD  Eyes: He was equal react to light and  accommodation  Neck: Negative JVD, negative lymphadenopathy  Cardiovascular: Irregular irregular rhythm and rate, negative murmurs rubs gallops, normal S1/S2  Respiratory: Clear to auscultation bilateral  Abdomen: Obese, soft, nontender, plus bowel sound  Musculoskeletal: Negative ecchymosis, negative edema,  Neurologic: Cranial nerves II through XII intact, tongue/uvula midline, extremity strength 5/5, sensation intact, left DTR 2+ at patella, right DTR at patella absent (S/P TKA) throughout, quick finger touches bilateral within normal limits, finger nose finger bilateral within normal limits, did not ambulate patient  Labs on Admission:  Basic Metabolic Panel:  Recent Labs Lab 09/24/14 1408 09/24/14 1418  NA 137 139  K 3.9 3.8  CL 104 101  CO2 24  --   GLUCOSE 123* 126*  BUN 13 15  CREATININE 0.92 0.90  CALCIUM 8.8  --    Liver Function Tests:  Recent Labs Lab 09/24/14 1408  AST 23  ALT 19  ALKPHOS 44  BILITOT 0.6  PROT 7.2  ALBUMIN 3.9   No results for input(s): LIPASE, AMYLASE in the last 168 hours. No results for input(s): AMMONIA in the last 168 hours. CBC:  Recent Labs Lab 09/24/14 1408 09/24/14 1418  WBC 12.8*  --   NEUTROABS 10.9*  --   HGB 11.4* 15.0  HCT 40.0 44.0  MCV 72.3*  --   PLT 260  --    Cardiac Enzymes: No results for input(s): CKTOTAL, CKMB, CKMBINDEX, TROPONINI in the last 168 hours.  BNP (last 3 results) No results for input(s): PROBNP in the last 8760 hours. CBG: No results for input(s): GLUCAP in the last 168 hours.  Radiological Exams on Admission: Ct Head Wo Contrast  09/24/2014   CLINICAL DATA:  Code stroke. Onset of headache at 10 a.m. Persistent visual disturbance.  EXAM: CT HEAD WITHOUT CONTRAST  TECHNIQUE: Contiguous axial images were obtained from the base of the skull through the vertex without intravenous contrast.  COMPARISON:  Brain MRI - 06/01/2004  FINDINGS: There is mild atrophy with centralized volume loss and  mild ex vacuo dilatation of the ventricular system. Scattered minimal paranasal hypodensities compatible microvascular ischemic disease. The gray-white differentiation is otherwise well maintained without CT evidence  of acute large territory infarct. No intraparenchymal extra-axial mass or hemorrhage. No midline shift. Intracranial atherosclerosis.  Limited visualization the paranasal sinuses and mastoid air cells are normal. No air-fluid levels. Regional soft tissues appear normal. Mild hyperostosis frontalis. No displaced calvarial fracture.  IMPRESSION: Mild atrophy and microvascular ischemic disease without acute intracranial process.  Critical Value/emergent results were called by telephone at the time of interpretation on 09/24/2014 at 2:23 pm to Dr. Janann Colonel, who verbally acknowledged these results.   Electronically Signed   By: Sandi Mariscal M.D.   On: 09/24/2014 14:24   Mr Brain Wo Contrast  09/24/2014   CLINICAL DATA:  Stroke. Acute onset right peripheral field vision CT. Left-sided headache. Weakness. New onset atrial flutter.  EXAM: MRI HEAD WITHOUT CONTRAST  MRA HEAD WITHOUT CONTRAST  TECHNIQUE: Multiplanar, multiecho pulse sequences of the brain and surrounding structures were obtained without intravenous contrast. Angiographic images of the head were obtained using MRA technique without contrast.  COMPARISON:  Head CT 09/24/2014 and MRI 06/01/2004  FINDINGS: MRI HEAD FINDINGS  There are small areas of acute cortical and subcortical infarction involving the superomedial and inferolateral aspects of the left occipital lobe. There is a single punctate focus of susceptibility artifact in the medial left occipital lobe suggestive of microhemorrhage. There is mild generalized cerebral atrophy. Patchy T2 hyperintensities in the periventricular white matter have mildly progressed from the prior MRI and are nonspecific but compatible with mild chronic small vessel ischemic disease.  Orbits are unremarkable.  Paranasal sinuses are clear. No significant mastoid air cell fluid is seen. Major intracranial vascular flow voids are preserved.  MRA HEAD FINDINGS  The visualized distal vertebral arteries are patent and codominant. PICA origins are patent. Right AICA and bilateral SCA origins are patent. Basilar artery is patent without stenosis. PCAs are patent without proximal stenosis. Mild irregularity and attenuation are noted involving bilateral PCA branch vessels, greater on the left. Posterior communicating arteries are not clearly identified.  Internal carotid arteries are patent from skullbase to carotid termini without stenosis. ACAs and MCAs are unremarkable aside from mild branch vessel irregularity. No intracranial aneurysm is identified.  IMPRESSION: 1. Small, acute left occipital lobe infarcts. 2. Mild chronic small vessel ischemic disease. 3. No evidence of major intracranial arterial occlusion or significant proximal stenosis. Branch vessel irregularity suggestive of atherosclerosis.   Electronically Signed   By: Logan Bores   On: 09/24/2014 17:22   Mr Jodene Nam Head/brain Wo Cm  09/24/2014   CLINICAL DATA:  Stroke. Acute onset right peripheral field vision CT. Left-sided headache. Weakness. New onset atrial flutter.  EXAM: MRI HEAD WITHOUT CONTRAST  MRA HEAD WITHOUT CONTRAST  TECHNIQUE: Multiplanar, multiecho pulse sequences of the brain and surrounding structures were obtained without intravenous contrast. Angiographic images of the head were obtained using MRA technique without contrast.  COMPARISON:  Head CT 09/24/2014 and MRI 06/01/2004  FINDINGS: MRI HEAD FINDINGS  There are small areas of acute cortical and subcortical infarction involving the superomedial and inferolateral aspects of the left occipital lobe. There is a single punctate focus of susceptibility artifact in the medial left occipital lobe suggestive of microhemorrhage. There is mild generalized cerebral atrophy. Patchy T2 hyperintensities in the  periventricular white matter have mildly progressed from the prior MRI and are nonspecific but compatible with mild chronic small vessel ischemic disease.  Orbits are unremarkable. Paranasal sinuses are clear. No significant mastoid air cell fluid is seen. Major intracranial vascular flow voids are preserved.  MRA HEAD FINDINGS  The  visualized distal vertebral arteries are patent and codominant. PICA origins are patent. Right AICA and bilateral SCA origins are patent. Basilar artery is patent without stenosis. PCAs are patent without proximal stenosis. Mild irregularity and attenuation are noted involving bilateral PCA branch vessels, greater on the left. Posterior communicating arteries are not clearly identified.  Internal carotid arteries are patent from skullbase to carotid termini without stenosis. ACAs and MCAs are unremarkable aside from mild branch vessel irregularity. No intracranial aneurysm is identified.  IMPRESSION: 1. Small, acute left occipital lobe infarcts. 2. Mild chronic small vessel ischemic disease. 3. No evidence of major intracranial arterial occlusion or significant proximal stenosis. Branch vessel irregularity suggestive of atherosclerosis.   Electronically Signed   By: Logan Bores   On: 09/24/2014 17:22    EKG:   Assessment/Plan Active Problems:   Essential hypertension   Obstructive sleep apnea   CAD (coronary artery disease), native coronary artery   DM (diabetes mellitus), type 2 with peripheral vascular complications   HLD (hyperlipidemia)   Atrial flutter   Hepatitis   CVA (cerebral infarction)   CVA -Counseled patient and family he has suffered left occipital lobe infarcts -Carotid Dopplers pending -Echocardiogram pending -Start Plavix 35 mg daily -Neuro checks q 4hr -PT/OT consulted  HTN -Metoprolol 25 mg BID -Hold Lasix secondary to soft BP  Atrial flutter (new onset)/A. fib -Currently rate controlled -See HTN -Echocardiogram pending -EKG in  a.m.  PVD,  -Continue Lipitor 80 mg daily -Lipid panel ending -A1c pending  CAD,  -See PVD  OSA. -CPAP per respiratory  HLD,  -See PVD    Code Status: Full Family Communication: Family present Disposition Plan: Completion of stroke workup  Time spent: 50 minutes  Allie Bossier [facility] Pager [phone]  If 7PM-7AM, please contact night-coverage [email or web address] Password [email or web address] 09/24/2014, 5:59 PM

## 2014-09-24 NOTE — ED Provider Notes (Signed)
CSN: [id number]     Arrival date & time 09/24/14  1302 History   First MD Initiated Contact with Patient 09/24/14 1339     Chief Complaint  Patient presents with  . Headache   HPI Patient presents to the emergency room with complaints of acute lightheadedness associated with a mild headache and visual disturbance. The symptoms occurred at approximately 10:30. The patient suddenly felt very cool and clammy.  He also felt dizzy and lightheaded. He denies any trouble with any chest pain or shortness of breath. Symptoms are getting better although not completely resolved. Now the patient is also noticing denies having some trouble with his vision. Past Medical History  Diagnosis Date  . CAD (coronary artery disease)   . HTN (hypertension)   . Prostatitis   . Hyperlipidemia   . Obesity   . Diverticular disease   . Gallstones   . Hepatitis age 62  . Arthritis   . Sleep apnea     mild, CPAP wasn't indicated (per pt)--2014 mod-severe, CPAP rec  . Benign prostatic hypertrophy     followed by Dr. Gaynelle Arabian  . Cerebrovascular disease   . Sepsis due to Escherichia coli ? 2011    DUE TO URINARY OUTLET OBSTRUCTION /INFECTION  . UTI (urinary tract infection)     secondary  . Allergy     RHINITIS  . 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 Stanford Breed); s/p stents  . GERD (gastroesophageal reflux disease)   . Hypogonadism male     treated by Dr. Gaynelle Arabian  . Erectile dysfunction   . Peripheral vascular disease     02/28/12 MOST RECENT CAROTID DUPLEX--"STABLE MILD CALCIFIED PLAQUE, BILATERALY, STABLE, OVER SERIAL EXAMS"  . 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 IFG (impaired fasting glucose)   . Pneumonia     hx of  . 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 Total knee arthroplasty  L 4/06, R 4/07  . Vasectomy    . Tonsillectomy    . Transurethral resection of prostate  06/2010  . Knee  surgery  05/2010    removing scar tissue R knee (Dr. Wynelle Link)  . Coronary angioplasty with stent placement  01/1997    PTCA/stent to RCA  AND 2009 2ND STENT PLACED  . Joint replacement      both knees  . Total knee revision  09/23/2012    Procedure: TOTAL KNEE REVISION;  Surgeon: Gearlean Alf, MD;  Location: [facility];  Service: Orthopedics;  Laterality: Right;  . Coronary artery bypass graft N/A 04/15/2014    Procedure: CORONARY ARTERY BYPASS GRAFTING TIMES TWO USEING LEFT INTERNAL MAMMARY ARTERY AND RIGHT GREATER SAPHENOUS VEIN VIA ENDOVEIN HARVEST;  Surgeon: Rexene Alberts, MD;  Location: [place];  Service: Open Heart Surgery;  Laterality: N/A;  . Intraoperative transesophageal echocardiogram N/A 04/15/2014    Procedure: INTRAOPERATIVE TRANSESOPHAGEAL ECHOCARDIOGRAM;  Surgeon: Rexene Alberts, MD;  Location: [place];  Service: Open Heart Surgery;  Laterality: N/A;  . Left and right heart catheterization with coronary angiogram  04/13/2014    Procedure: LEFT AND RIGHT HEART CATHETERIZATION WITH CORONARY ANGIOGRAM;  Surgeon: Sinclair Grooms, MD;  Location: [facility];  Service: Cardiovascular;;   Family History  Problem Relation Age of Onset  . Coronary artery disease Mother     deceased  .  Dementia Mother   . Atrial fibrillation Mother   . Stroke Mother     related to atrial fib  . Coronary artery disease Father     deceased  . Hypertension Father   . Heart disease Father   . Cancer Paternal Uncle     colon; died in his 74's  . Colon cancer Paternal Uncle   . Hypertension Paternal Grandmother   . Heart disease Paternal Grandmother   . Hypertension Paternal Grandfather   . Heart disease Paternal Grandfather   . Diabetes Neg Hx    History  Substance Use Topics  . Smoking status: Former Smoker -- 1.50 packs/day for 30 years    Types: Cigarettes    Quit date: 09/16/1986  . Smokeless tobacco: Never Used     Comment: 35 pack year history, none in 18 years  . Alcohol Use: Yes      Comment: 1 glass of wine twice a week (previously daily)    Review of Systems  All other systems reviewed and are negative.     Allergies  Review of patient's allergies indicates no known allergies.  Home Medications   Prior to Admission medications   Medication Sig Start Date End Date Taking? Authorizing Provider  acetaminophen (TYLENOL) 500 MG tablet Take 1,000 mg by mouth at bedtime as needed for mild pain.     Historical Provider, MD  amoxicillin (AMOXIL) 875 MG tablet Take 1 tablet (875 mg total) by mouth 2 (two) times daily. 08/24/14   Rita Ohara, MD  aspirin EC 325 MG EC tablet Take 1 tablet (325 mg total) by mouth daily. 04/19/14   Erin Barrett, PA-C  atorvastatin (LIPITOR) 80 MG tablet Take 80 mg by mouth every evening.    Historical Provider, MD  benzonatate (TESSALON) 200 MG capsule Take 1 capsule (200 mg total) by mouth 3 (three) times daily as needed. 08/24/14   Rita Ohara, MD  CALCIUM-MAGNESIUM-ZINC PO Take 1 each by mouth daily.    Historical Provider, MD  Cholecalciferol (VITAMIN D) 2000 UNITS tablet Take 2,000 Units by mouth daily.    Historical Provider, MD  Coenzyme Q10 (CO Q-10) 100 MG CAPS Take 1 capsule by mouth daily.    Historical Provider, MD  fluticasone (FLONASE) 50 MCG/ACT nasal spray USE 2 SPRAYS INTO THE NOSE DAILY 05/13/14   Rita Ohara, MD  furosemide (LASIX) 20 MG tablet Take 1 tablet (20 mg total) by mouth daily. 08/08/14   Lelon Perla, MD  loratadine (CLARITIN) 10 MG tablet Take 10 mg by mouth daily.     Historical Provider, MD  metoprolol tartrate (LOPRESSOR) 25 MG tablet Take 1 tablet (25 mg total) by mouth 2 (two) times daily. 08/16/14   Lelon Perla, MD  omeprazole (PRILOSEC) 20 MG capsule Take 20 mg by mouth every other day.     Historical Provider, MD  potassium chloride (KLOR-CON 10) 10 MEQ tablet Take 1 tablet (10 mEq total) by mouth daily. 08/08/14   Lelon Perla, MD  STUDY MEDICATION Take 1 tablet by mouth every evening. [employer] [facility]  for cholesterol. Study medication Anacetrapib or placebo    Historical Provider, MD  testosterone cypionate (DEPOTESTOTERONE CYPIONATE) 200 MG/ML injection Inject 200 mg into the muscle every 14 (fourteen) days.  01/12/14   Historical Provider, MD   BP 136/77 mmHg  Pulse 74  Temp(Src) 98.8 F (37.1 C) (Oral)  Resp 14  SpO2 98% Physical Exam  Constitutional: He is oriented to person, place, and time.  He appears well-developed and well-nourished. No distress.  HENT:  Head: Normocephalic and atraumatic.  Right Ear: External ear normal.  Left Ear: External ear normal.  Mouth/Throat: Oropharynx is clear and moist.  Eyes: Conjunctivae are normal. Right eye exhibits no discharge. Left eye exhibits no discharge. No scleral icterus.  Neck: Neck supple. No tracheal deviation present.  Cardiovascular: Normal rate, regular rhythm and intact distal pulses.   Pulmonary/Chest: Effort normal and breath sounds normal. No stridor. No respiratory distress. He has no wheezes. He has no rales.  Abdominal: Soft. Bowel sounds are normal. He exhibits no distension. There is no tenderness. There is no rebound and no guarding.  Musculoskeletal: He exhibits no edema or tenderness.  Neurological: He is alert and oriented to person, place, and time. He has normal strength. No cranial nerve deficit (no facial droop, extraocular movements intact, no slurred speech) or sensory deficit. He exhibits normal muscle tone. He displays no seizure activity. Coordination normal.  No pronator drift bilateral upper extrem, able to hold both legs off bed for 5 seconds, sensation intact in all extremities, right temporal visual field cult, no left or right sided neglect, normal finger-nose exam bilaterally, no nystagmus noted No difficulty with naming objects, no slurred speech, some difficulty reading sentences but pt states it is because some portions of the word are missing  Skin: Skin is warm and dry. No rash noted.  Psychiatric:  He has a normal mood and affect.  Nursing note and vitals reviewed.   ED Course  Procedures (including critical care time) Labs Review Labs Reviewed  CBC - Abnormal; Notable for the following:    WBC 12.8 (*)    Hemoglobin 11.4 (*)    MCV 72.3 (*)    MCH 20.6 (*)    MCHC 28.5 (*)    RDW 18.9 (*)    All other components within normal limits  DIFFERENTIAL - Abnormal; Notable for the following:    Neutrophils Relative % 85 (*)    Neutro Abs 10.9 (*)    Lymphocytes Relative 8 (*)    All other components within normal limits  I-STAT CHEM 8, ED - Abnormal; Notable for the following:    Glucose, Bld 126 (*)    Calcium, Ion 1.09 (*)    All other components within normal limits  ETHANOL  PROTIME-INR  APTT  COMPREHENSIVE METABOLIC PANEL  URINE RAPID DRUG SCREEN (HOSP PERFORMED)  URINALYSIS, ROUTINE W REFLEX MICROSCOPIC  I-STAT TROPOININ, ED  I-STAT TROPOININ, ED    Imaging Review Ct Head Wo Contrast  09/24/2014   CLINICAL DATA:  Code stroke. Onset of headache at 10 a.m. Persistent visual disturbance.  EXAM: CT HEAD WITHOUT CONTRAST  TECHNIQUE: Contiguous axial images were obtained from the base of the skull through the vertex without intravenous contrast.  COMPARISON:  Brain MRI - 06/01/2004  FINDINGS: There is mild atrophy with centralized volume loss and mild ex vacuo dilatation of the ventricular system. Scattered minimal paranasal hypodensities compatible microvascular ischemic disease. The gray-white differentiation is otherwise well maintained without CT evidence of acute large territory infarct. No intraparenchymal extra-axial mass or hemorrhage. No midline shift. Intracranial atherosclerosis.  Limited visualization the paranasal sinuses and mastoid air cells are normal. No air-fluid levels. Regional soft tissues appear normal. Mild hyperostosis frontalis. No displaced calvarial fracture.  IMPRESSION: Mild atrophy and microvascular ischemic disease without acute intracranial process.   Critical Value/emergent results were called by telephone at the time of interpretation on 09/24/2014 at 2:23 pm to Dr. Janann Colonel,  who verbally acknowledged these results.   Electronically Signed   By: Sandi Mariscal M.D.   On: 09/24/2014 14:24     EKG Interpretation   Date/Time:  Saturday September 24 2014 13:15:01 EST Ventricular Rate:  75 PR Interval:    QRS Duration: 143 QT Interval:  455 QTC Calculation: 508 R Axis:   -8 Text Interpretation:  Atrial flutter , new since prior tracing Nonspecific  intraventricular conduction delay Inferior infarct, age indeterminate  Confirmed by Olar Santini  MD-J, Cissy Galbreath ([id number]) on 09/24/2014 1:23:39 PM      MDM   Final diagnoses:  Stroke  Atrial flutter, unspecified    Pt has new onset atrial flutter on EKG.  He is also having symptoms that are concerning for TIA, stroke.  Code stroke activated.  Likely not a tpa candidate with the time onset and his low NIHSS but will speak with neurology.  Discussed initial findings and plan with patient.  Pt seen by neurology.  Not a TPA candidate.  Will consult with medical service for admission and further evaluation.    Dorie Rank, MD 09/24/14 [phone]

## 2014-09-24 NOTE — ED Notes (Addendum)
Pt. Developed lt. Side headache began at 10:00 am.  Pt. Had a period of feeling cool and clammy and just stated,"I did not feel good".Denies any chest painor sob.,  Alert and oriented X4. Pt. Denies any headache.  A-flutter on the monitor

## 2014-09-24 NOTE — ED Notes (Signed)
Pt. Went to MRI.  Updated pt.s wife on pt.s plan of care.  Pt. 's wife taken to 70N26.  Pt.s belongings went with pt.'s wife.

## 2014-09-24 NOTE — Code Documentation (Signed)
Code stroke called at 1358.  As per patient and family, LSN 1000 when patient developed left side headache and was having trouble with vision in right eye.  Wife states patient became pale and clammy.  Patient states headache is better, but still having vision troubles.  NIHSS 1

## 2014-09-24 NOTE — Consult Note (Signed)
Stroke Consult    Chief Complaint: visual field deficit  HPI: Jeffrey Wiggins is an 74 y.o. male presenting with complaints of light headedness associated with headache and visual disturbance. The visual disturbance is described as loss of the right visual field. He reports symptoms are in both eyes. Headache described as left sided pressure type pain that has resolved. No prior history of symptoms.  In ED found to have new onset A flutter. Head CT imaging completed and overall unremarkable.   Date last known well: 09/24/2014 Time last known well: 1000 tPA Given: no, mildness of symptoms (initial NIHSS of 1)  Past Medical History  Diagnosis Date  . CAD (coronary artery disease)   . HTN (hypertension)   . Prostatitis   . Hyperlipidemia   . Obesity   . Diverticular disease   . Gallstones   . Hepatitis age 42  . Arthritis   . Sleep apnea     mild, CPAP wasn't indicated (per pt)--2014 mod-severe, CPAP rec  . Benign prostatic hypertrophy     followed by Dr. Gaynelle Arabian  . Cerebrovascular disease   . Sepsis due to Escherichia coli ? 2011    DUE TO URINARY OUTLET OBSTRUCTION /INFECTION  . UTI (urinary tract infection)     secondary  . Allergy     RHINITIS  . 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 Stanford Breed); s/p stents  . GERD (gastroesophageal reflux disease)   . Hypogonadism male     treated by Dr. Gaynelle Arabian  . Erectile dysfunction   . Peripheral vascular disease     02/28/12 MOST RECENT CAROTID DUPLEX--"STABLE MILD CALCIFIED PLAQUE, BILATERALY, STABLE, OVER SERIAL EXAMS"  . 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 IFG (impaired fasting glucose)   . Pneumonia     hx of  . 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 Total knee arthroplasty  L 4/06, R 4/07  . Vasectomy    . Tonsillectomy    . Transurethral resection of prostate  06/2010  . Knee surgery  05/2010    removing scar  tissue R knee (Dr. Wynelle Link)  . Coronary angioplasty with stent placement  01/1997    PTCA/stent to RCA  AND 2009 2ND STENT PLACED  . Joint replacement      both knees  . Total knee revision  09/23/2012    Procedure: TOTAL KNEE REVISION;  Surgeon: Gearlean Alf, MD;  Location: [facility];  Service: Orthopedics;  Laterality: Right;  . Coronary artery bypass graft N/A 04/15/2014    Procedure: CORONARY ARTERY BYPASS GRAFTING TIMES TWO USEING LEFT INTERNAL MAMMARY ARTERY AND RIGHT GREATER SAPHENOUS VEIN VIA ENDOVEIN HARVEST;  Surgeon: Rexene Alberts, MD;  Location: [place];  Service: Open Heart Surgery;  Laterality: N/A;  . Intraoperative transesophageal echocardiogram N/A 04/15/2014    Procedure: INTRAOPERATIVE TRANSESOPHAGEAL ECHOCARDIOGRAM;  Surgeon: Rexene Alberts, MD;  Location: [place];  Service: Open Heart Surgery;  Laterality: N/A;  . Left and right heart catheterization with coronary angiogram  04/13/2014    Procedure: LEFT AND RIGHT HEART CATHETERIZATION WITH CORONARY ANGIOGRAM;  Surgeon: Sinclair Grooms, MD;  Location: [facility] CATH LAB;  Service: Cardiovascular;;    Family History  Problem Relation Age of Onset  . Coronary artery disease Mother     deceased  . Dementia Mother   .  Atrial fibrillation Mother   . Stroke Mother     related to atrial fib  . Coronary artery disease Father     deceased  . Hypertension Father   . Heart disease Father   . Cancer Paternal Uncle     colon; died in his 11's  . Colon cancer Paternal Uncle   . Hypertension Paternal Grandmother   . Heart disease Paternal Grandmother   . Hypertension Paternal Grandfather   . Heart disease Paternal Grandfather   . Diabetes Neg Hx    Social History:  reports that he quit smoking about 28 years ago. His smoking use included Cigarettes. He has a 45 pack-year smoking history. He has never used smokeless tobacco. He reports that he drinks alcohol. He reports that he does not use illicit drugs.  Allergies: No Known  Allergies   (Not in a hospital admission)  ROS: Out of a complete 14 system review, the patient complains of only the following symptoms, and all other reviewed systems are negative. +dizziness and visual disturbance   Physical Examination: Filed Vitals:   09/24/14 1316  BP: 136/77  Pulse: 74  Temp: 98.8 F (37.1 C)  Resp: 14   Physical Exam  Constitutional: He appears well-developed and well-nourished.  Psych: Affect appropriate to situation Eyes: No scleral injection HENT: No OP obstrucion, no temporal tenderness Head: Normocephalic.  Cardiovascular: Normal rate and regular rhythm.  Respiratory: Effort normal and breath sounds normal.  GI: Soft. Bowel sounds are normal. No distension. There is no tenderness.  Skin: WDI   Neurologic Examination: Mental Status: Alert, oriented, thought content appropriate.  Speech fluent without evidence of aphasia.  Able to follow 3 step commands without difficulty. Cranial Nerves: II: unable to visualize fundi due to pupil size,right visual field cut, pupils equal, round, reactive to light  III,IV, VI: ptosis not present, extra-ocular motions intact bilaterally V,VII: smile symmetric, facial light touch sensation normal bilaterally VIII: hearing normal bilaterally IX,X: gag reflex present XI: trapezius strength/neck flexion strength normal bilaterally XII: tongue strength normal  Motor: Right : Upper extremity    Left:     Upper extremity 5/5 deltoid       5/5 deltoid 5/5 biceps      5/5 biceps  5/5 triceps      5/5 triceps 5/5 hand grip      5/5 hand grip  Lower extremity     Lower extremity 5/5 hip flexor      5/5 hip flexor 5/5 quadricep      5/5 quadriceps  5/5 hamstrings     5/5 hamstrings 5/5 plantar flexion       5/5 plantar flexion 5/5 plantar extension     5/5 plantar extension Tone and bulk:normal tone throughout; no atrophy noted Sensory: Pinprick and light touch intact throughout, bilaterally Deep Tendon  Reflexes: 2+ and symmetric throughout, 1+ patellar and AJs Plantars: Right: downgoing   Left: downgoing Cerebellar: normal finger-to-nose, normal rapid alternating movements and normal heel-to-shin test Gait: did not assess due to multiple leads on in ED  Laboratory Studies:   Basic Metabolic Panel:  Recent Labs Lab 09/24/14 1418  NA 139  K 3.8  CL 101  GLUCOSE 126*  BUN 15  CREATININE 0.90    Liver Function Tests: No results for input(s): AST, ALT, ALKPHOS, BILITOT, PROT, ALBUMIN in the last 168 hours. No results for input(s): LIPASE, AMYLASE in the last 168 hours. No results for input(s): AMMONIA in the last 168 hours.  CBC:  Recent Labs Lab 09/24/14 1418  HGB 15.0  HCT 44.0    Cardiac Enzymes: No results for input(s): CKTOTAL, CKMB, CKMBINDEX, TROPONINI in the last 168 hours.  BNP: Invalid input(s): POCBNP  CBG: No results for input(s): GLUCAP in the last 168 hours.  Microbiology: Results for orders placed or performed during the hospital encounter of 04/13/14  MRSA PCR Screening     Status: None   Collection Time: 04/13/14  7:41 PM  Result Value Ref Range Status   MRSA by PCR NEGATIVE NEGATIVE Final    Comment:        The GeneXpert MRSA Assay (FDA approved for NASAL specimens only), is one component of a comprehensive MRSA colonization surveillance program. It is not intended to diagnose MRSA infection nor to guide or monitor treatment for MRSA infections.  Surgical pcr screen     Status: None   Collection Time: 04/15/14  2:08 AM  Result Value Ref Range Status   MRSA, PCR NEGATIVE NEGATIVE Final   Staphylococcus aureus NEGATIVE NEGATIVE Final    Comment:        The Xpert SA Assay (FDA approved for NASAL specimens in patients over 87 years of age), is one component of a comprehensive surveillance program.  Test performance has been validated by [employer] for patients greater than or equal to 52 year old. It is not intended to diagnose  infection nor to guide or monitor treatment.    Coagulation Studies: No results for input(s): LABPROT, INR in the last 72 hours.  Urinalysis: No results for input(s): COLORURINE, LABSPEC, PHURINE, GLUCOSEU, HGBUR, BILIRUBINUR, KETONESUR, PROTEINUR, UROBILINOGEN, NITRITE, LEUKOCYTESUR in the last 168 hours.  Invalid input(s): APPERANCEUR  Lipid Panel:     Component Value Date/Time   CHOL 85 04/14/2014 0303   TRIG 114 04/14/2014 0303   HDL 33* 04/14/2014 0303   CHOLHDL 2.6 04/14/2014 0303   VLDL 23 04/14/2014 0303   LDLCALC 29 04/14/2014 0303    HgbA1C:  Lab Results  Component Value Date   HGBA1C 5.6 08/18/2014    Urine Drug Screen:  No results found for: LABOPIA, COCAINSCRNUR, LABBENZ, AMPHETMU, THCU, LABBARB  Alcohol Level: No results for input(s): ETH in the last 168 hours.   Imaging: Ct Head Wo Contrast  09/24/2014   CLINICAL DATA:  Code stroke. Onset of headache at 10 a.m. Persistent visual disturbance.  EXAM: CT HEAD WITHOUT CONTRAST  TECHNIQUE: Contiguous axial images were obtained from the base of the skull through the vertex without intravenous contrast.  COMPARISON:  Brain MRI - 06/01/2004  FINDINGS: There is mild atrophy with centralized volume loss and mild ex vacuo dilatation of the ventricular system. Scattered minimal paranasal hypodensities compatible microvascular ischemic disease. The gray-white differentiation is otherwise well maintained without CT evidence of acute large territory infarct. No intraparenchymal extra-axial mass or hemorrhage. No midline shift. Intracranial atherosclerosis.  Limited visualization the paranasal sinuses and mastoid air cells are normal. No air-fluid levels. Regional soft tissues appear normal. Mild hyperostosis frontalis. No displaced calvarial fracture.  IMPRESSION: Mild atrophy and microvascular ischemic disease without acute intracranial process.  Critical Value/emergent results were called by telephone at the time of interpretation  on 09/24/2014 at 2:23 pm to Dr. Janann Colonel, who verbally acknowledged these results.   Electronically Signed   By: Sandi Mariscal M.D.   On: 09/24/2014 14:24    Assessment: 74 y.o. male history of HTN, CAD, HLD presenting with acute onset right visual field cut. Found to have new onset A flutter in  the ED. Besides for VF cut his neurological exam is unremarkable. Code stroke activated, no tPA given for minimal symptoms. With new onset A flutter concern for possible cardio-embolic etiology. Admit for further workup.  Stroke Risk Factors - HTN, CAD, HLD  Plan: 1. HgbA1c, fasting lipid panel 2. MRI, MRA  of the brain without contrast 3. PT consult, OT consult, Speech consult 4. Echocardiogram 5. Carotid dopplers 6. Prophylactic therapy-change to Plavix 75mg  daily 7. Risk factor modification 8. Telemetry monitoring 9. Frequent neuro checks 10. NPO until RN stroke swallow screen   Jim Like, DO [facility] [phone]  If 7pm- 7am, please page neurology on call as listed in AMION. 09/24/2014, 2:30 PM

## 2014-09-24 NOTE — Progress Notes (Signed)
Pt arrived to 4N26 from ED. Wife at bedside. A+Ox4. VSS. Placed on tele. Oriented to room and equipment. Provided stroke education. Will continue to monitor.

## 2014-09-25 DIAGNOSIS — I059 Rheumatic mitral valve disease, unspecified: Secondary | ICD-10-CM

## 2014-09-25 DIAGNOSIS — I639 Cerebral infarction, unspecified: Secondary | ICD-10-CM | POA: Insufficient documentation

## 2014-09-25 DIAGNOSIS — I10 Essential (primary) hypertension: Secondary | ICD-10-CM

## 2014-09-25 DIAGNOSIS — I2510 Atherosclerotic heart disease of native coronary artery without angina pectoris: Secondary | ICD-10-CM

## 2014-09-25 DIAGNOSIS — I63112 Cerebral infarction due to embolism of left vertebral artery: Secondary | ICD-10-CM

## 2014-09-25 LAB — HEMOGLOBIN A1C
HEMOGLOBIN A1C: 6 % — AB (ref <5.7)
Mean Plasma Glucose: 126 mg/dL — ABNORMAL HIGH (ref <117)

## 2014-09-25 LAB — LIPID PANEL
CHOL/HDL RATIO: 2.9 ratio
Cholesterol: 110 mg/dL (ref 0–200)
HDL: 38 mg/dL — ABNORMAL LOW (ref >39)
LDL Cholesterol: 51 mg/dL (ref 0–99)
Triglycerides: 104 mg/dL (ref <150)
VLDL: 21 mg/dL (ref 0–40)

## 2014-09-25 MED ORDER — ATORVASTATIN CALCIUM 80 MG PO TABS
80.0000 mg | ORAL_TABLET | Freq: Every day | ORAL | Status: DC
  Administered 2014-09-25 – 2014-09-26 (×2): 80 mg via ORAL
  Filled 2014-09-25 (×2): qty 1

## 2014-09-25 NOTE — Progress Notes (Signed)
[facility] PROGRESS NOTE  Jeffrey Wiggins GQQ:[id number] DOB: 07-11-1941 DOA: 09/24/2014 PCP: [name], MD  Assessment/Plan: CVA -Counseled patient and family he has suffered left occipital lobe infarcts -Carotid Dopplers bilateral stenosis without occlusion see results below  -Echocardiogram pending -Continue Plavix 75 mg daily -Neuro checks q 4hr -PT; recommended no follow-up -Discharge patient in a.m. after results of echocardiogram obtained  Essential HTN -D/C Metoprolol 25 mg BID; allow permissive HTN secondary to CVA; reassess BP medication requirements in the a.m. -Goal BP; maintain SBP= 145-165 -Hold Lasix secondary to soft BP  HLD -Within AHA guidelines except for HDL low -Continue Lipitor 80 mg daily  Atrial flutter (new onset)/A. fib -Currently in  [facility]  -See HTN -Echocardiogram pending  PVD,  -Continue Lipitor 80 mg daily -Lipid panel ending -A1c pending  CAD,  -See PVD  OSA. -CPAP per respiratory  HLD,  -See PVD     Code Status: Full Family Communication:  None person spoken with, relationship, and if by phone, the number) Disposition Plan: DC in a.m. after echocardiogram results obtained   Consultants: Dr. Jim Like (neurology)   Procedures: 1/9 CT head without contrast; Mild atrophy and microvascular ischemic disease without acute intracranial process 1/9 MRI/MRA brain without contrast;-Small, acute left occipital lobe infarcts. - Mild chronic small vessel ischemic disease. -No evidence of major intracranial arterial occlusion or significant proximal stenosis.  1/10 carotid Doppler;40-59% right ICA stenosis. 1-39% left ICA stenosis. Vertebral artery flow is antegrade.    Cultures NA  Antibiotics: NA  DVT prophylaxis:  Lovenox    HPI/Subjective: Jeffrey Wiggins is a 74 y.o. 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 Presented to the emergency room with complaints of acute  lightheadedness associated with a mild headache and visual disturbance. The symptoms occurred at approximately 10:30. The patient suddenly felt very cool and clammy. He also felt dizzy and lightheaded. He denies any trouble with any chest pain or shortness of breath. Symptoms are getting better although not completely resolved. Now the patient is also noticing denies having some trouble with his vision.      Objective: Filed Vitals:   09/25/14 0200 09/25/14 0400 09/25/14 1009 09/25/14 1410  BP: 114/60 111/57 115/56 117/57  Pulse: 72 69 93 74  Temp: 98.1 F (36.7 C) 98.1 F (36.7 C) 98.6 F (37 C) 99.5 F (37.5 C)  TempSrc: Oral Oral Oral Oral  Resp: 16 16 18 18   Height:      Weight:      SpO2: 100% 99% 98% 97%    Intake/Output Summary (Last 24 hours) at 09/25/14 1457 Last data filed at 09/25/14 6712  Gross per 24 hour  Intake    480 ml  Output      0 ml  Net    480 ml   Filed Weights   09/24/14 1730  Weight: 118.48 kg (261 lb 3.2 oz)     Exam: General: A/O 4, NAD Cardiovascular: Regular rhythm and rate, negative murmurs rubs gallops, normal S1/S2 Respiratory: Clear to auscultation bilateral Abdomen: Obese, soft, nontender, plus bowel sound Musculoskeletal: Negative ecchymosis, negative edema, Neurologic: Cranial nerves II through XII intact, tongue/uvula midline, extremity strength 5/5, sensation intact, left DTR 2+ at patella, right DTR at patella absent (S/P TKA) throughout, quick finger touches bilateral within normal limits, finger nose finger bilateral within normal limits, did not ambulate patient  Data Reviewed: Basic Metabolic Panel:  Recent Labs Lab 09/24/14 1408 09/24/14 1418  NA 137 139  K 3.9 3.8  CL 104 101  CO2 24  --   GLUCOSE 123* 126*  BUN 13 15  CREATININE 0.92 0.90  CALCIUM 8.8  --    Liver Function Tests:  Recent Labs Lab 09/24/14 1408  AST 23  ALT 19  ALKPHOS 44  BILITOT 0.6  PROT 7.2  ALBUMIN 3.9   No results for  input(s): LIPASE, AMYLASE in the last 168 hours. No results for input(s): AMMONIA in the last 168 hours. CBC:  Recent Labs Lab 09/24/14 1408 09/24/14 1418  WBC 12.8*  --   NEUTROABS 10.9*  --   HGB 11.4* 15.0  HCT 40.0 44.0  MCV 72.3*  --   PLT 260  --    Cardiac Enzymes: No results for input(s): CKTOTAL, CKMB, CKMBINDEX, TROPONINI in the last 168 hours. BNP (last 3 results) No results for input(s): PROBNP in the last 8760 hours. CBG: No results for input(s): GLUCAP in the last 168 hours.  No results found for this or any previous visit (from the past 240 hour(s)).   Studies: Ct Head Wo Contrast  09/24/2014   CLINICAL DATA:  Code stroke. Onset of headache at 10 a.m. Persistent visual disturbance.  EXAM: CT HEAD WITHOUT CONTRAST  TECHNIQUE: Contiguous axial images were obtained from the base of the skull through the vertex without intravenous contrast.  COMPARISON:  Brain MRI - 06/01/2004  FINDINGS: There is mild atrophy with centralized volume loss and mild ex vacuo dilatation of the ventricular system. Scattered minimal paranasal hypodensities compatible microvascular ischemic disease. The gray-white differentiation is otherwise well maintained without CT evidence of acute large territory infarct. No intraparenchymal extra-axial mass or hemorrhage. No midline shift. Intracranial atherosclerosis.  Limited visualization the paranasal sinuses and mastoid air cells are normal. No air-fluid levels. Regional soft tissues appear normal. Mild hyperostosis frontalis. No displaced calvarial fracture.  IMPRESSION: Mild atrophy and microvascular ischemic disease without acute intracranial process.  Critical Value/emergent results were called by telephone at the time of interpretation on 09/24/2014 at 2:23 pm to Dr. Janann Colonel, who verbally acknowledged these results.   Electronically Signed   By: Sandi Mariscal M.D.   On: 09/24/2014 14:24   Mr Brain Wo Contrast  09/24/2014   CLINICAL DATA:  Stroke. Acute  onset right peripheral field vision CT. Left-sided headache. Weakness. New onset atrial flutter.  EXAM: MRI HEAD WITHOUT CONTRAST  MRA HEAD WITHOUT CONTRAST  TECHNIQUE: Multiplanar, multiecho pulse sequences of the brain and surrounding structures were obtained without intravenous contrast. Angiographic images of the head were obtained using MRA technique without contrast.  COMPARISON:  Head CT 09/24/2014 and MRI 06/01/2004  FINDINGS: MRI HEAD FINDINGS  There are small areas of acute cortical and subcortical infarction involving the superomedial and inferolateral aspects of the left occipital lobe. There is a single punctate focus of susceptibility artifact in the medial left occipital lobe suggestive of microhemorrhage. There is mild generalized cerebral atrophy. Patchy T2 hyperintensities in the periventricular white matter have mildly progressed from the prior MRI and are nonspecific but compatible with mild chronic small vessel ischemic disease.  Orbits are unremarkable. Paranasal sinuses are clear. No significant mastoid air cell fluid is seen. Major intracranial vascular flow voids are preserved.  MRA HEAD FINDINGS  The visualized distal vertebral arteries are patent and codominant. PICA origins are patent. Right AICA and bilateral SCA origins are patent. Basilar artery is patent without stenosis. PCAs are patent without proximal stenosis. Mild irregularity and attenuation are noted  involving bilateral PCA branch vessels, greater on the left. Posterior communicating arteries are not clearly identified.  Internal carotid arteries are patent from skullbase to carotid termini without stenosis. ACAs and MCAs are unremarkable aside from mild branch vessel irregularity. No intracranial aneurysm is identified.  IMPRESSION: 1. Small, acute left occipital lobe infarcts. 2. Mild chronic small vessel ischemic disease. 3. No evidence of major intracranial arterial occlusion or significant proximal stenosis. Branch vessel  irregularity suggestive of atherosclerosis.   Electronically Signed   By: Logan Bores   On: 09/24/2014 17:22   Mr Jodene Nam Head/brain Wo Cm  09/24/2014   CLINICAL DATA:  Stroke. Acute onset right peripheral field vision CT. Left-sided headache. Weakness. New onset atrial flutter.  EXAM: MRI HEAD WITHOUT CONTRAST  MRA HEAD WITHOUT CONTRAST  TECHNIQUE: Multiplanar, multiecho pulse sequences of the brain and surrounding structures were obtained without intravenous contrast. Angiographic images of the head were obtained using MRA technique without contrast.  COMPARISON:  Head CT 09/24/2014 and MRI 06/01/2004  FINDINGS: MRI HEAD FINDINGS  There are small areas of acute cortical and subcortical infarction involving the superomedial and inferolateral aspects of the left occipital lobe. There is a single punctate focus of susceptibility artifact in the medial left occipital lobe suggestive of microhemorrhage. There is mild generalized cerebral atrophy. Patchy T2 hyperintensities in the periventricular white matter have mildly progressed from the prior MRI and are nonspecific but compatible with mild chronic small vessel ischemic disease.  Orbits are unremarkable. Paranasal sinuses are clear. No significant mastoid air cell fluid is seen. Major intracranial vascular flow voids are preserved.  MRA HEAD FINDINGS  The visualized distal vertebral arteries are patent and codominant. PICA origins are patent. Right AICA and bilateral SCA origins are patent. Basilar artery is patent without stenosis. PCAs are patent without proximal stenosis. Mild irregularity and attenuation are noted involving bilateral PCA branch vessels, greater on the left. Posterior communicating arteries are not clearly identified.  Internal carotid arteries are patent from skullbase to carotid termini without stenosis. ACAs and MCAs are unremarkable aside from mild branch vessel irregularity. No intracranial aneurysm is identified.  IMPRESSION: 1. Small, acute  left occipital lobe infarcts. 2. Mild chronic small vessel ischemic disease. 3. No evidence of major intracranial arterial occlusion or significant proximal stenosis. Branch vessel irregularity suggestive of atherosclerosis.   Electronically Signed   By: Logan Bores   On: 09/24/2014 17:22    Scheduled Meds: . aspirin  300 mg Rectal Daily   Or  . aspirin  325 mg Oral Daily  . clopidogrel  75 mg Oral Daily  . enoxaparin (LOVENOX) injection  40 mg Subcutaneous Q24H  . metoprolol tartrate  25 mg Oral BID   Continuous Infusions:   Active Problems:   Essential hypertension   Obstructive sleep apnea   CAD (coronary artery disease), native coronary artery   DM (diabetes mellitus), type 2 with peripheral vascular complications   HLD (hyperlipidemia)   Atrial flutter   Hepatitis   CVA (cerebral infarction)   Atrial flutter, unspecified   Atrial fibrillation, unspecified   Coronary artery disease involving autologous artery coronary bypass graft with unspecified angina pectoris   CVA (cerebral vascular accident)   Atherosclerotic peripheral vascular disease    Time spent: 40 minutes    Ibrahim Mcpheeters, J  [facility] Pager [phone]. If 7PM-7AM, please contact night-coverage at [email or web address], password [facility] 09/25/2014, 2:57 PM  LOS: 1 day

## 2014-09-25 NOTE — Evaluation (Signed)
Physical Therapy Evaluation Patient Details Name: Jeffrey Wiggins MRN: [id number] DOB: April 15, 1941 Today's Date: 09/25/2014   History of Present Illness    74 y.o. 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 Presented to the emergency room with complaints of acute lightheadedness associated with a mild headache and visual disturbance. The symptoms occurred at approximately 10:30. The patient suddenly felt very cool and clammy. He also felt dizzy and lightheaded. He denies any trouble with any chest pain or shortness of breath. Symptoms are getting better although not completely resolved. Now the patient is also noticing denies having some trouble with his vision.   Clinical Impression  Pt presents at his baseline, independent with all mobility and no significant findings.  Pt is physically active following d/c from cardiac rehab and has no concerns about getting around.  No PT needs at this time, d/c care.  Thank you,    Follow Up Recommendations No PT follow up    Equipment Recommendations  None recommended by PT    Recommendations for Other Services       Precautions / Restrictions        Mobility  Bed Mobility Overal bed mobility: Independent                Transfers Overall transfer level: Independent Equipment used: None             General transfer comment: no instability or difficulty noted  Ambulation/Gait Ambulation/Gait assistance: Independent Ambulation Distance (Feet): 200 Feet Assistive device: None Gait Pattern/deviations: Step-through pattern   Gait velocity interpretation: at or above normal speed for age/gender General Gait Details: no problems or issues with gait, stability or ability  Stairs Stairs: Yes Stairs assistance: Modified independent (Device/Increase time) Stair Management: One rail Left;Forwards Number of Stairs: 5 General stair comments: up and over without hesitation and light use of rail  for balance  Wheelchair Mobility    Modified Rankin (Stroke Patients Only) Modified Rankin (Stroke Patients Only) Pre-Morbid Rankin Score: No symptoms Modified Rankin: No symptoms     Balance Overall balance assessment: No apparent balance deficits (not formally assessed)                               Standardized Balance Assessment Standardized Balance Assessment : Dynamic Gait Index   Dynamic Gait Index Level Surface: Normal Change in Gait Speed: Normal Gait with Horizontal Head Turns: Normal Gait with Vertical Head Turns: Mild Impairment Gait and Pivot Turn: Mild Impairment Step Over Obstacle: Normal Step Around Obstacles: Normal Steps: Mild Impairment Total Score: 21       Pertinent Vitals/Pain Pain Assessment: No/denies pain    Home Living Family/patient expects to be discharged to:: Private residence Living Arrangements: Spouse/significant other Available Help at Discharge: Family;Available 24 hours/day Type of Home: House Home Access: Stairs to enter   [employer] of Steps: 4 Home Layout: One level Home Equipment: None (may have RW following previous TKA's)      Prior Function Level of Independence: Independent         Comments: just d/c from cardiac rehab, exercises 5 days/week      Hand Dominance        Extremity/Trunk Assessment   Upper Extremity Assessment: Overall WFL for tasks assessed           Lower Extremity Assessment: Overall WFL for tasks assessed  Communication   Communication: HOH (bilateral hearing aids)  Cognition Arousal/Alertness: Awake/alert Behavior During Therapy: WFL for tasks assessed/performed Overall Cognitive Status: Within Functional Limits for tasks assessed                      General Comments      Exercises        Assessment/Plan    PT Assessment Patent does not need any further PT services  PT Diagnosis Other (comment) (none)   PT Problem List     PT Treatment Interventions     PT Goals (Current goals can be found in the Care Plan section) Acute Rehab PT Goals PT Goal Formulation: All assessment and education complete, DC therapy    Frequency     Barriers to discharge        Co-evaluation               End of Session   Activity Tolerance: Patient tolerated treatment well Patient left: in bed;with family/visitor present;with call bell/phone within reach Nurse Communication: Mobility status         Time: 1340-1400 PT Time Calculation (min) (ACUTE ONLY): 20 min   Charges:   PT Evaluation $Initial PT Evaluation Tier I: 1 Procedure     PT G CodesHerbie Drape 09/25/2014, 2:03 PM

## 2014-09-25 NOTE — Progress Notes (Signed)
  Echocardiogram 2D Echocardiogram has been performed.  Jeffrey Wiggins 09/25/2014, 2:56 PM

## 2014-09-25 NOTE — Progress Notes (Signed)
STROKE TEAM PROGRESS NOTE   HISTORY Jeffrey Wiggins is a 74 y.o. male presenting with complaints of light headedness associated with headache and visual disturbance. The visual disturbance is described as loss of the right visual field. He reports symptoms are in both eyes. Headache described as left sided pressure type pain that has resolved. No prior history of symptoms.  In ED found to have new onset A fibrillation. Head CT imaging completed and overall unremarkable.   Date last known well: 09/24/2014 Time last known well: 1000 tPA Given: no, mildness of symptoms (initial NIHSS of 1)   SUBJECTIVE (INTERVAL HISTORY) Family member at the bedside. The patient feels his vision is 99% better.   OBJECTIVE Temp:  [98.1 F (36.7 C)-99.5 F (37.5 C)] 99.5 F (37.5 C) (01/10 1410) Pulse Rate:  [66-93] 74 (01/10 1410) Cardiac Rhythm:  [-] Atrial flutter (01/10 0400) Resp:  [16-25] 18 (01/10 1410) BP: (111-148)/(42-77) 117/57 mmHg (01/10 1410) SpO2:  [97 %-100 %] 97 % (01/10 1410) Weight:  [261 lb 3.2 oz (118.48 kg)] 261 lb 3.2 oz (118.48 kg) (01/09 1730)  No results for input(s): GLUCAP in the last 168 hours.  Recent Labs Lab 09/24/14 1408 09/24/14 1418  NA 137 139  K 3.9 3.8  CL 104 101  CO2 24  --   GLUCOSE 123* 126*  BUN 13 15  CREATININE 0.92 0.90  CALCIUM 8.8  --     Recent Labs Lab 09/24/14 1408  AST 23  ALT 19  ALKPHOS 44  BILITOT 0.6  PROT 7.2  ALBUMIN 3.9    Recent Labs Lab 09/24/14 1408 09/24/14 1418  WBC 12.8*  --   NEUTROABS 10.9*  --   HGB 11.4* 15.0  HCT 40.0 44.0  MCV 72.3*  --   PLT 260  --    No results for input(s): CKTOTAL, CKMB, CKMBINDEX, TROPONINI in the last 168 hours.  Recent Labs  09/24/14 1408  LABPROT 14.0  INR 1.07    Recent Labs  09/24/14 1430  COLORURINE YELLOW  LABSPEC 1.019  PHURINE 6.0  GLUCOSEU NEGATIVE  HGBUR NEGATIVE  BILIRUBINUR NEGATIVE  KETONESUR NEGATIVE  PROTEINUR NEGATIVE  UROBILINOGEN 0.2  NITRITE  NEGATIVE  LEUKOCYTESUR NEGATIVE       Component Value Date/Time   CHOL 110 09/25/2014 0652   TRIG 104 09/25/2014 0652   HDL 38* 09/25/2014 0652   CHOLHDL 2.9 09/25/2014 0652   VLDL 21 09/25/2014 0652   LDLCALC 51 09/25/2014 0652   Lab Results  Component Value Date   HGBA1C 5.6 08/18/2014      Component Value Date/Time   LABOPIA NONE DETECTED 09/24/2014 1430   COCAINSCRNUR NONE DETECTED 09/24/2014 1430   LABBENZ NONE DETECTED 09/24/2014 1430   AMPHETMU NONE DETECTED 09/24/2014 1430   THCU NONE DETECTED 09/24/2014 1430   LABBARB NONE DETECTED 09/24/2014 1430     Recent Labs Lab 09/24/14 1408  ETH <5    Ct Head Wo Contrast 09/24/2014    Mild atrophy and microvascular ischemic disease without acute intracranial process.     MRI / MRA Brain Wo Contrast 09/24/2014    1. Small, acute left occipital lobe infarcts.  2. Mild chronic small vessel ischemic disease.  3. No evidence of major intracranial arterial occlusion or significant proximal stenosis. Branch vessel irregularity suggestive of atherosclerosis.       PHYSICAL EXAM Pleasant middle-aged Caucasian male currently not in distress.Awake alert. Afebrile. Head is nontraumatic. Neck is supple without bruit. Hearing is normal. Cardiac  exam no murmur or gallop. Lungs are clear to auscultation. Distal pulses are well felt. Neurological Exam ;  Awake  Alert oriented x 3. Normal speech and language.eye movements full without nystagmus.fundi were not visualized. Vision acuity and fields appear normal. Hearing is normal. Palatal movements are normal. Face symmetric. Tongue midline. Normal strength, tone, reflexes and coordination. Normal sensation. Gait deferred.    ASSESSMENT/PLAN [name] is a 74 y.o. male with history of hyperlipidemia, obesity, obstructive sleep apnea, hepatitis, cerebrovascular disease, coronary artery disease, peripheral vascular disease, and new onset of atrial flutter presenting with visual  disturbance and headache. He did not receive IV t-PA due to minimal deficits.   Strokes:  Dominant infarcts probably embolic secondary to atrial flutter.  Resultant  visual deficits improving.  MRI  as above  MRA  as above   Carotid Doppler 40-59% right ICA stenosis. 1-39% left ICA stenosis. Vertebral artery flow is antegrade.   2D Echo  pending  LDL 51   HgbA1c 5.6  Lovenox for VTE prophylaxis  Diet Heart with thin  liquids  aspirin 325 mg orally every day prior to admission, now on aspirin 325 mg orally every day and clopidogrel 75 mg orally every day  Ongoing aggressive stroke risk factor management  Therapy recommendations:  No follow-up physical therapy recommended   Disposition:  Pending   Hypertension  Home meds:  Lopressor 25 mg twice a day. ( On hold )  BP runs slow Permissive hypertension <220/120 for 24-48 hours and then gradually normalize within 5-7 days   Hyperlipidemia  Home meds:  Lipitor 80 mg daily not resumed in hospital  LDL 51, goal < 70  Restart Lipitor when appropriate   Continue statin at discharge   Other Stroke Risk Factors  Advanced age  ETOH use  Obesity, Body mass index is 40.9 kg/(m^2).   Family hx stroke (his mother had a stroke related to atrial fibrillation)  Coronary artery disease  Obstructive sleep apnea, on CPAP at home  New onset atrial flutter  Other Active Problems  Anticoagulation for atrial fibrillation to be discussed   Atrial fibrillation documented with strips on telemetry  ? Start Eliquis ?  Other Pertinent History  Previous coronary artery bypass graft surgery    Hospital day # [place] PA-C [facility] Pager [phone] 09/25/2014, 4:02 PM  I have personally examined this patient, reviewed notes, independently viewed imaging studies, participated in medical decision making and plan of care. I have made any additions or clarifications directly to the above note.  Agree with note above. The patient has clearly had a cardioembolic left occipital infarcts in the setting of new onset atrial fibrillation/flutter. He needs to be on anticoagulation with [place]os. We will finish remaining stroke workup. I had a long discussion with the patient and family members about  his stroke, risk for recurrence, risk factor modification and answered questions.  Antony Contras, MD Medical Director [facility] Pager: [phone] 09/25/2014 9:31 PM    To contact Stroke Continuity provider, please refer to [email or web address] After hours, contact General Neurology

## 2014-09-25 NOTE — Progress Notes (Signed)
VASCULAR LAB PRELIMINARY  PRELIMINARY  PRELIMINARY  PRELIMINARY  Carotid Dopplers completed.    Preliminary report:  40-59% right ICA stenosis.  1-39% left ICA stenosis.  Vertebral artery flow is antegrade.   Brandye Inthavong, RVT 09/25/2014, 11:32 AM

## 2014-09-25 NOTE — Progress Notes (Signed)
Rt Note: Pt has CPAP setup at bedside on auto titrate (6-20) with nasal mask. Pt states he can place himself on when ready

## 2014-09-26 DIAGNOSIS — I63012 Cerebral infarction due to thrombosis of left vertebral artery: Secondary | ICD-10-CM

## 2014-09-26 DIAGNOSIS — I483 Typical atrial flutter: Secondary | ICD-10-CM

## 2014-09-26 MED ORDER — APIXABAN 5 MG PO TABS
5.0000 mg | ORAL_TABLET | Freq: Two times a day (BID) | ORAL | Status: DC
  Administered 2014-09-26 – 2014-09-27 (×3): 5 mg via ORAL
  Filled 2014-09-26 (×3): qty 1

## 2014-09-26 MED ORDER — ASPIRIN 81 MG PO TBEC: 81.0000 mg | DELAYED_RELEASE_TABLET | Freq: Every day | ORAL | Status: DC

## 2014-09-26 MED ORDER — ENOXAPARIN SODIUM 120 MG/0.8ML ~~LOC~~ SOLN
120.0000 mg | Freq: Two times a day (BID) | SUBCUTANEOUS | Status: AC
  Administered 2014-09-26 – 2014-09-27 (×2): 120 mg via SUBCUTANEOUS
  Filled 2014-09-26 (×2): qty 0.8

## 2014-09-26 MED ORDER — APIXABAN 5 MG PO TABS: 5.0000 mg | ORAL_TABLET | Freq: Two times a day (BID) | ORAL | Status: DC

## 2014-09-26 MED ORDER — ASPIRIN EC 81 MG PO TBEC
81.0000 mg | DELAYED_RELEASE_TABLET | Freq: Every day | ORAL | Status: DC
  Administered 2014-09-27: 81 mg via ORAL
  Filled 2014-09-26: qty 1

## 2014-09-26 NOTE — Evaluation (Signed)
Occupational Therapy Evaluation Patient Details Name: Jeffrey Wiggins MRN: [id number] DOB: [birth year] Today's Date: 09/26/2014    History of Present Illness Jeffrey Wiggins is a 74 y.o. male presenting with complaints of light headedness associated with headache and visual disturbance. The visual disturbance is described as loss of the right visual field. He reports symptoms are in both eyes. Headache described as left sided pressure type pain that has resolved.  Pt with small left occipital CVAs.   Clinical Impression   Pt currently at an independent level for selfcare tasks and functional transfers.  Visual deficits have resolved since admission with pt currently demonstrating full occular AROM bilaterally and peripheral fields WFLs for gross testing and with function.  No further OT needs at this time.    Follow Up Recommendations  No OT follow up    Equipment Recommendations  None recommended by OT       Precautions / Restrictions Precautions Precautions: None Restrictions Weight Bearing Restrictions: No      Mobility Bed Mobility Overal bed mobility: Independent                Transfers Overall transfer level: Independent Equipment used: None                  Balance Overall balance assessment: Independent                                          ADL Overall ADL's : At baseline                                       General ADL Comments: Pt currently independent for all simulated selfcare tasks and functional transfers at this time.      Vision Eye Alignment: Within Functional Limits Alignment/Gaze Preference: Within Defined Limits Ocular Range of Motion: Within Functional Limits Tracking/Visual Pursuits: Able to track stimulus in all quads without difficulty Saccades: Within functional limits Convergence: Within functional limits         Perception Perception Perception Tested?: Yes   Praxis Praxis Praxis  tested?: Within functional limits    Pertinent Vitals/Pain Pain Assessment: No/denies pain     Hand Dominance Right   Extremity/Trunk Assessment Upper Extremity Assessment Upper Extremity Assessment: Overall WFL for tasks assessed   Lower Extremity Assessment Lower Extremity Assessment: Defer to PT evaluation   Cervical / Trunk Assessment Cervical / Trunk Assessment: Normal   Communication Communication Communication: HOH (bilateral hearing aids)   Cognition Arousal/Alertness: Awake/alert Behavior During Therapy: WFL for tasks assessed/performed Overall Cognitive Status: Within Functional Limits for tasks assessed                                Home Living Family/patient expects to be discharged to:: Private residence Living Arrangements: Spouse/significant other Available Help at Discharge: Family;Available 24 hours/day Type of Home: House Home Access: Stairs to enter [employer] of Steps: 4   Home Layout: One level     Bathroom Shower/Tub: Walk-in shower;Tub/shower unit (uses the tub shower) Shower/tub characteristics: Architectural technologist: Handicapped height     Home Equipment: None (may have RW following previous TKA's)          Prior Functioning/Environment Level of Independence: Independent  Comments: just d/c from cardiac rehab, exercises 5 days/week                               End of Session    Activity Tolerance: Patient tolerated treatment well Patient left: with call bell/phone within reach;with family/visitor present;in bed   Time: 1334-1350 OT Time Calculation (min): 16 min Charges:  OT General Charges $OT Visit: 1 Procedure OT Evaluation $Initial OT Evaluation Tier I: 1 Procedure  Darnetta Kesselman OTR/L 09/26/2014, 1:55 PM

## 2014-09-26 NOTE — Progress Notes (Signed)
CARE MANAGEMENT NOTE 09/26/2014  Patient:  Jeffrey Wiggins, Jeffrey Wiggins   Account Number:  [id number]  Date Initiated:  09/26/2014  Documentation initiated by:  Olga Coaster  Subjective/Objective Assessment:   ADMITTED WITH STROKE     Action/Plan:   CM FOLLOWING FOR DCP   Anticipated DC Date:  09/30/2014   Anticipated DC Plan:  [place]  CM consult          Status of service:  In process, will continue to follow Medicare Important Message given?   (If response is "NO", the following Medicare IM given date fields will be blank)  Per UR Regulation:  Reviewed for med. necessity/level of care/duration of stay  Comments:  1/11/2016Mindi Slicker RN,BSN,MHA [phone]

## 2014-09-26 NOTE — Progress Notes (Signed)
SLP Cancellation Note  Patient Details Name: Jeffrey Wiggins MRN: [id number] DOB: 02-13-1941   Cancelled treatment:        SLP overheard Dr. Karleen Hampshire in room re: discharge. MD stated pt is leaving to day and does not need cognitive-linguistic assessment.   Houston Siren 09/26/2014, 2:00 PM   Orbie Pyo Colvin Caroli.Ed [employer] [phone]

## 2014-09-26 NOTE — Progress Notes (Signed)
Nurse informed Dr. Allyson Sabal via text msg of cardiologist recommendation of dc tomorrow after eliquis dosage x3.   Jeffrey Wiggins I 09/26/2014 6:51 PM

## 2014-09-26 NOTE — Discharge Summary (Addendum)
Physician Discharge Summary  Jeffrey Wiggins MRN: [id number] DOB/AGE: Oct 04, 1940 74 y.o.  PCP: [name], MD   Admit date: 09/24/2014 Discharge date: 09/26/2014  Discharge Diagnoses:      Essential hypertension   Obstructive sleep apnea   CAD (coronary artery disease), native coronary artery   DM (diabetes mellitus), type 2 with peripheral vascular complications   HLD (hyperlipidemia)   Atrial flutter   Hepatitis   CVA (cerebral infarction)   Atrial flutter, unspecified   Atrial fibrillation, unspecified. 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     apixaban 5 MG Tabs tablet  Commonly known as:  ELIQUIS  Take 1 tablet (5 mg total) by mouth 2 (two) times daily.     aspirin 81 MG EC tablet  Take 1 tablet (81 mg total) by mouth daily.     atorvastatin 80 MG tablet  Commonly known as:  LIPITOR  Take 80 mg by mouth every evening.     benzonatate 200 MG capsule  Commonly known as:  TESSALON  Take 1 capsule (200 mg total) by mouth 3 (three) times daily as needed.     CALCIUM-MAGNESIUM-ZINC PO  Take 1 each by mouth daily.     Co Q-10 100 MG Caps  Take 1 capsule by mouth daily.     fluticasone 50 MCG/ACT nasal spray  Commonly known as:  FLONASE  USE 2 SPRAYS INTO THE NOSE DAILY     furosemide 20 MG tablet  Commonly known as:  LASIX  Take 1 tablet (20 mg total) by mouth daily.     loratadine 10 MG tablet  Commonly known as:  CLARITIN  Take 10 mg by mouth daily.      metoprolol tartrate 25 MG tablet  Commonly known as:  LOPRESSOR  Take 1 tablet (25 mg total) by mouth 2 (two) times daily.     NITROSTAT 0.4 MG SL tablet  Generic drug:  nitroGLYCERIN  Place 0.4 mg under the tongue every 5 (five) minutes as needed for chest pain.     omeprazole 20 MG capsule  Commonly known as:  PRILOSEC  Take 20 mg by mouth every other day.     potassium chloride 10 MEQ tablet  Commonly known as:  KLOR-CON 10  Take 1 tablet (10 mEq total) by mouth daily.     STUDY MEDICATION  Take 1 tablet by mouth every evening. [employer] for cholesterol. Study medication Anacetrapib or placebo     testosterone cypionate 200 MG/ML injection  Commonly known as:  DEPOTESTOTERONE CYPIONATE  Inject 200 mg into the muscle every 14 (fourteen) days.     Vitamin D 2000 UNITS tablet  Take 2,000 Units by mouth daily.        Discharge Condition:    Disposition: 01-Home or Self Care   Consults:  Neurology Cardiology    Significant  Diagnostic Studies: Ct Head Wo Contrast  09/24/2014   CLINICAL DATA:  Code stroke. Onset of headache at 10 a.m. Persistent visual disturbance.  EXAM: CT HEAD WITHOUT CONTRAST  TECHNIQUE: Contiguous axial images were obtained from the base of the skull through the vertex without intravenous contrast.  COMPARISON:  Brain MRI - 06/01/2004  FINDINGS: There is mild atrophy with centralized volume loss and mild ex vacuo dilatation of the ventricular system. Scattered minimal paranasal hypodensities compatible microvascular ischemic disease. The gray-white differentiation is otherwise well maintained without CT evidence of acute large territory infarct. No intraparenchymal extra-axial mass or hemorrhage. No midline shift. Intracranial atherosclerosis.  Limited visualization the paranasal sinuses and mastoid air cells are normal. No air-fluid levels. Regional soft tissues appear normal. Mild hyperostosis frontalis. No displaced calvarial fracture.   IMPRESSION: Mild atrophy and microvascular ischemic disease without acute intracranial process.  Critical Value/emergent results were called by telephone at the time of interpretation on 09/24/2014 at 2:23 pm to Dr. Janann Colonel, who verbally acknowledged these results.   Electronically Signed   By: Sandi Mariscal M.D.   On: 09/24/2014 14:24   Mr Brain Wo Contrast  09/24/2014   CLINICAL DATA:  Stroke. Acute onset right peripheral field vision CT. Left-sided headache. Weakness. New onset atrial flutter.  EXAM: MRI HEAD WITHOUT CONTRAST  MRA HEAD WITHOUT CONTRAST  TECHNIQUE: Multiplanar, multiecho pulse sequences of the brain and surrounding structures were obtained without intravenous contrast. Angiographic images of the head were obtained using MRA technique without contrast.  COMPARISON:  Head CT 09/24/2014 and MRI 06/01/2004  FINDINGS: MRI HEAD FINDINGS  There are small areas of acute cortical and subcortical infarction involving the superomedial and inferolateral aspects of the left occipital lobe. There is a single punctate focus of susceptibility artifact in the medial left occipital lobe suggestive of microhemorrhage. There is mild generalized cerebral atrophy. Patchy T2 hyperintensities in the periventricular white matter have mildly progressed from the prior MRI and are nonspecific but compatible with mild chronic small vessel ischemic disease.  Orbits are unremarkable. Paranasal sinuses are clear. No significant mastoid air cell fluid is seen. Major intracranial vascular flow voids are preserved.  MRA HEAD FINDINGS  The visualized distal vertebral arteries are patent and codominant. PICA origins are patent. Right AICA and bilateral SCA origins are patent. Basilar artery is patent without stenosis. PCAs are patent without proximal stenosis. Mild irregularity and attenuation are noted involving bilateral PCA branch vessels, greater on the left. Posterior communicating arteries are not clearly identified.  Internal  carotid arteries are patent from skullbase to carotid termini without stenosis. ACAs and MCAs are unremarkable aside from mild branch vessel irregularity. No intracranial aneurysm is identified.  IMPRESSION: 1. Small, acute left occipital lobe infarcts. 2. Mild chronic small vessel ischemic disease. 3. No evidence of major intracranial arterial occlusion or significant proximal stenosis. Branch vessel irregularity suggestive of atherosclerosis.   Electronically Signed   By: Logan Bores   On: 09/24/2014 17:22   Mr Jodene Nam Head/brain Wo Cm  09/24/2014   CLINICAL DATA:  Stroke. Acute onset right peripheral field vision CT. Left-sided headache. Weakness. New onset atrial flutter.  EXAM: MRI HEAD WITHOUT CONTRAST  MRA HEAD WITHOUT CONTRAST  TECHNIQUE: Multiplanar, multiecho pulse sequences of the brain and surrounding structures were obtained without intravenous contrast. Angiographic images of the head were obtained using MRA technique without contrast.  COMPARISON:  Head CT 09/24/2014 and MRI 06/01/2004  FINDINGS: MRI HEAD FINDINGS  There are small areas of acute cortical and  subcortical infarction involving the superomedial and inferolateral aspects of the left occipital lobe. There is a single punctate focus of susceptibility artifact in the medial left occipital lobe suggestive of microhemorrhage. There is mild generalized cerebral atrophy. Patchy T2 hyperintensities in the periventricular white matter have mildly progressed from the prior MRI and are nonspecific but compatible with mild chronic small vessel ischemic disease.  Orbits are unremarkable. Paranasal sinuses are clear. No significant mastoid air cell fluid is seen. Major intracranial vascular flow voids are preserved.  MRA HEAD FINDINGS  The visualized distal vertebral arteries are patent and codominant. PICA origins are patent. Right AICA and bilateral SCA origins are patent. Basilar artery is patent without stenosis. PCAs are patent without proximal  stenosis. Mild irregularity and attenuation are noted involving bilateral PCA branch vessels, greater on the left. Posterior communicating arteries are not clearly identified.  Internal carotid arteries are patent from skullbase to carotid termini without stenosis. ACAs and MCAs are unremarkable aside from mild branch vessel irregularity. No intracranial aneurysm is identified.  IMPRESSION: 1. Small, acute left occipital lobe infarcts. 2. Mild chronic small vessel ischemic disease. 3. No evidence of major intracranial arterial occlusion or significant proximal stenosis. Branch vessel irregularity suggestive of atherosclerosis.   Electronically Signed   By: Logan Bores   On: 09/24/2014 17:22     Microbiology: No results found for this or any previous visit (from the past 240 hour(s)).   Labs: Results for orders placed or performed during the hospital encounter of 09/24/14 (from the past 48 hour(s))  Ethanol     Status: None   Collection Time: 09/24/14  2:08 PM  Result Value Ref Range   Alcohol, Ethyl (B) <5 0 - 9 mg/dL    Comment:        LOWEST DETECTABLE LIMIT FOR SERUM ALCOHOL IS 11 mg/dL FOR MEDICAL PURPOSES ONLY   Protime-INR     Status: None   Collection Time: 09/24/14  2:08 PM  Result Value Ref Range   Prothrombin Time 14.0 11.6 - 15.2 seconds   INR 1.07 0.00 - 1.49  APTT     Status: None   Collection Time: 09/24/14  2:08 PM  Result Value Ref Range   aPTT 32 24 - 37 seconds  CBC     Status: Abnormal   Collection Time: 09/24/14  2:08 PM  Result Value Ref Range   WBC 12.8 (H) 4.0 - 10.5 K/uL   RBC 5.53 4.22 - 5.81 MIL/uL   Hemoglobin 11.4 (L) 13.0 - 17.0 g/dL   HCT 40.0 39.0 - 52.0 %   MCV 72.3 (L) 78.0 - 100.0 fL   MCH 20.6 (L) 26.0 - 34.0 pg   MCHC 28.5 (L) 30.0 - 36.0 g/dL   RDW 18.9 (H) 11.5 - 15.5 %   Platelets 260 150 - 400 K/uL  Differential     Status: Abnormal   Collection Time: 09/24/14  2:08 PM  Result Value Ref Range   Neutrophils Relative % 85 (H) 43 - 77 %    Neutro Abs 10.9 (H) 1.7 - 7.7 K/uL   Lymphocytes Relative 8 (L) 12 - 46 %   Lymphs Abs 1.0 0.7 - 4.0 K/uL   Monocytes Relative 5 3 - 12 %   Monocytes Absolute 0.7 0.1 - 1.0 K/uL   Eosinophils Relative 1 0 - 5 %   Eosinophils Absolute 0.2 0.0 - 0.7 K/uL   Basophils Relative 0 0 - 1 %   Basophils Absolute 0.0 0.0 -  0.1 K/uL  Comprehensive metabolic panel     Status: Abnormal   Collection Time: 09/24/14  2:08 PM  Result Value Ref Range   Sodium 137 135 - 145 mmol/L    Comment: Please note change in reference range.   Potassium 3.9 3.5 - 5.1 mmol/L    Comment: Please note change in reference range.   Chloride 104 96 - 112 mEq/L   CO2 24 19 - 32 mmol/L   Glucose, Bld 123 (H) 70 - 99 mg/dL   BUN 13 6 - 23 mg/dL   Creatinine, Ser 0.92 0.50 - 1.35 mg/dL   Calcium 8.8 8.4 - 10.5 mg/dL   Total Protein 7.2 6.0 - 8.3 g/dL   Albumin 3.9 3.5 - 5.2 g/dL   AST 23 0 - 37 U/L   ALT 19 0 - 53 U/L   Alkaline Phosphatase 44 39 - 117 U/L   Total Bilirubin 0.6 0.3 - 1.2 mg/dL   GFR calc non Af Amer 82 (L) >90 mL/min   GFR calc Af Amer >90 >90 mL/min    Comment: (NOTE) The eGFR has been calculated using the CKD EPI equation. This calculation has not been validated in all clinical situations. eGFR's persistently <90 mL/min signify possible Chronic Kidney Disease.    Anion gap 9 5 - 15  I-Stat Troponin, ED (not at [facility])     Status: None   Collection Time: 09/24/14  2:15 PM  Result Value Ref Range   Troponin i, poc 0.00 0.00 - 0.08 ng/mL   Comment 3            Comment: Due to the release kinetics of cTnI, a negative result within the first hours of the onset of symptoms does not rule out myocardial infarction with certainty. If myocardial infarction is still suspected, repeat the test at appropriate intervals.   I-Stat Chem 8, ED     Status: Abnormal   Collection Time: 09/24/14  2:18 PM  Result Value Ref Range   Sodium 139 135 - 145 mmol/L   Potassium 3.8 3.5 - 5.1 mmol/L   Chloride 101  96 - 112 mEq/L   BUN 15 6 - 23 mg/dL   Creatinine, Ser 0.90 0.50 - 1.35 mg/dL   Glucose, Bld 126 (H) 70 - 99 mg/dL   Calcium, Ion 1.09 (L) 1.13 - 1.30 mmol/L   TCO2 21 0 - 100 mmol/L   Hemoglobin 15.0 13.0 - 17.0 g/dL   HCT 44.0 39.0 - 52.0 %  Urine Drug Screen     Status: None   Collection Time: 09/24/14  2:30 PM  Result Value Ref Range   Opiates NONE DETECTED NONE DETECTED   Cocaine NONE DETECTED NONE DETECTED   Benzodiazepines NONE DETECTED NONE DETECTED   Amphetamines NONE DETECTED NONE DETECTED   Tetrahydrocannabinol NONE DETECTED NONE DETECTED   Barbiturates NONE DETECTED NONE DETECTED    Comment:        DRUG SCREEN FOR MEDICAL PURPOSES ONLY.  IF CONFIRMATION IS NEEDED FOR ANY PURPOSE, NOTIFY LAB WITHIN 5 DAYS.        LOWEST DETECTABLE LIMITS FOR URINE DRUG SCREEN Drug Class       Cutoff (ng/mL) Amphetamine      1000 Barbiturate      200 Benzodiazepine   568 Tricyclics       616 Opiates          300 Cocaine          300 THC  50   Urinalysis, Routine w reflex microscopic     Status: None   Collection Time: 09/24/14  2:30 PM  Result Value Ref Range   Color, Urine YELLOW YELLOW   APPearance CLEAR CLEAR   Specific Gravity, Urine 1.019 1.005 - 1.030   pH 6.0 5.0 - 8.0   Glucose, UA NEGATIVE NEGATIVE mg/dL   Hgb urine dipstick NEGATIVE NEGATIVE   Bilirubin Urine NEGATIVE NEGATIVE   Ketones, ur NEGATIVE NEGATIVE mg/dL   Protein, ur NEGATIVE NEGATIVE mg/dL   Urobilinogen, UA 0.2 0.0 - 1.0 mg/dL   Nitrite NEGATIVE NEGATIVE   Leukocytes, UA NEGATIVE NEGATIVE    Comment: MICROSCOPIC NOT DONE ON URINES WITH NEGATIVE PROTEIN, BLOOD, LEUKOCYTES, NITRITE, OR GLUCOSE <1000 mg/dL.  Hemoglobin A1c     Status: Abnormal   Collection Time: 09/25/14  6:52 AM  Result Value Ref Range   Hgb A1c MFr Bld 6.0 (H) <5.7 %    Comment: (NOTE)                                                                       According to the ADA Clinical Practice Recommendations for 2011,  when HbA1c is used as a screening test:  >=6.5%   Diagnostic of Diabetes Mellitus           (if abnormal result is confirmed) 5.7-6.4%   Increased risk of developing Diabetes Mellitus References:Diagnosis and Classification of Diabetes Mellitus,Diabetes EBXI,3568,61(UOHFG 1):S62-S69 and Standards of Medical Care in         Diabetes - 2011,Diabetes Care,2011,34 (Suppl 1):S11-S61.    Mean Plasma Glucose 126 (H) <117 mg/dL    Comment: Performed at [facility]  Lipid panel     Status: Abnormal   Collection Time: 09/25/14  6:52 AM  Result Value Ref Range   Cholesterol 110 0 - 200 mg/dL   Triglycerides 104 <150 mg/dL   HDL 38 (L) >39 mg/dL   Total CHOL/HDL Ratio 2.9 RATIO   VLDL 21 0 - 40 mg/dL   LDL Cholesterol 51 0 - 99 mg/dL    Comment:        Total Cholesterol/HDL:CHD Risk Coronary Heart Disease Risk Table                     Men   Women  1/2 Average Risk   3.4   3.3  Average Risk       5.0   4.4  2 X Average Risk   9.6   7.1  3 X Average Risk  23.4   11.0        Use the calculated Patient Ratio above and the CHD Risk Table to determine the patient's CHD Risk.        ATP III CLASSIFICATION (LDL):  <100     mg/dL   Optimal  100-129  mg/dL   Near or Above                    Optimal  130-159  mg/dL   Borderline  160-189  mg/dL   High  >190     mg/dL   Very High    Ct Head Wo Contrast 09/24/2014  Mild atrophy and microvascular ischemic disease without  acute intracranial process.    MRI / MRA Brain Wo Contrast 09/24/2014  1. Small, acute left occipital lobe infarcts.  2. Mild chronic small vessel ischemic disease.  3. No evidence of major intracranial arterial occlusion or significant proximal stenosis. Branch vessel irregularity suggestive of atherosclerosis   HPI :Jeffrey Wiggins is a 74 y.o. male presenting with complaints of light headedness associated with headache and visual disturbance. The visual disturbance is described as loss of the right visual  field. He reports symptoms are in both eyes. Headache described as left sided pressure type pain that has resolved. No prior history of symptoms.  In ED found to have new onset A fibrillation. Head CT imaging completed and overall unremarkable.      HOSPITAL COURSE:   74 y.o. male with history of hyperlipidemia, obesity, obstructive sleep apnea, hepatitis, cerebrovascular disease, coronary artery disease, peripheral vascular disease, and new onset of atrial flutter presenting with visual disturbance and headache. He did not receive IV t-PA due to minimal deficits.   Strokes: Dominant infarcts probably embolic secondary to atrial flutter.  Resultant visual deficits improving.  MRI as above  MRA as above   Carotid Doppler 40-59% right ICA stenosis. 1-39% left ICA stenosis. Vertebral artery flow is antegrade.  2D Echo Left ventricle: The cavity size was normal. Wall thickness was normal. Systolic function was mildly to moderately reduced. The estimated ejection fraction was in the range of 40% to 45%. Diffuse hypokinesis.  LDL 51   HgbA1c 5.6  Lovenox for VTE prophylaxis  Diet Heart with thin liquids  aspirin 325 mg orally every day prior to admission, now on aspirin 325 mg orally every day and clopidogrel 75 mg orally every day Per Neurology  patient has clearly had a cardioembolic left occipital infarcts in the setting of new onset atrial fibrillation/flutter. He needs to be on anticoagulation with Eliquis.. Recommend discontinuing Plavix if okay with his cardiologist. and I had a long discussion with the patient and family members about his stroke, risk for recurrence, risk factor modification and answered questions. Follow-up with neurology  in the stroke clinic in 2 months . Cardiology consulted prior to DC to assess for a fib and to review anticoagulation and antiplatelet therpy  Therapy recommendations: No follow-up physical therapy  recommended Disposition: home    Hypertension  Home meds: Lopressor 25 mg twice a day. ( On hold )  BP runs slow  Permissive hypertension <220/120 for 24-48 hours and then gradually normalize within 5-7 days   Hyperlipidemia  Home meds: Lipitor 80 mg daily   LDL 51, goal < 70  Restart Lipitor        Other Stroke Risk Factors  Advanced age  ETOH use  Obesity, Body mass index is 40.9 kg/(m^2).   Family hx stroke (his mother had a stroke related to atrial fibrillation)  Coronary artery disease  Obstructive sleep apnea, on CPAP at home  New onset atrial flutter   Other Pertinent History  Previous coronary artery bypass graft surgery      Discharge Exam:  Blood pressure 139/59, pulse 80, temperature 98 F (36.7 C), temperature source Oral, resp. rate 20, height 5' 7"  (1.702 m), weight 118.48 kg (261 lb 3.2 oz), SpO2 97 %.  General: A/O 4, NAD Cardiovascular: Regular rhythm and rate, negative murmurs rubs gallops, normal S1/S2 Respiratory: Clear to auscultation bilateral Abdomen: Obese, soft, nontender, plus bowel sound Musculoskeletal: Negative ecchymosis, negative edema, Neurologic: Cranial nerves II through XII intact, tongue/uvula midline, extremity strength  5/5, sensation intact, left DTR 2+ at patella, right DTR at patella absent (S/P TKA) throughout       Signed: [facility] 09/26/2014, 1:58 PM

## 2014-09-26 NOTE — Consult Note (Addendum)
CARDIOLOGY CONSULT NOTE       Patient ID: Jeffrey Wiggins MRN: [id number] DOB/AGE: 74-Sep-1942 74 y.o.  Admit date: 09/24/2014 Referring Physician:  Allyson Sabal Primary Physician: [name], MD Primary Cardiologist:  Stanford Breed Reason for Consultation:  CVA Atrial Flutter  Active Problems:   Essential hypertension   Obstructive sleep apnea   CAD (coronary artery disease), native coronary artery   DM (diabetes mellitus), type 2 with peripheral vascular complications   HLD (hyperlipidemia)   Atrial flutter   Hepatitis   CVA (cerebral infarction)   Atrial flutter, unspecified   Atrial fibrillation, unspecified   Coronary artery disease involving autologous artery coronary bypass graft with unspecified angina pectoris   CVA (cerebral vascular accident)   Atherosclerotic peripheral vascular disease   Stroke   HPI:   74 y.o. patient of Dr Stanford Breed  Admitted 1/9 with heachache and visual changes consistent with occipital stroke Confirmed by MRI/  He noted about a week ago that HR was irregular when using recumbant bike and higher than usual.  Currently has not received eliquis and is not on lovenox  Has received  Adult aspirin and plavix for some reason.  Noted on ECG to be in atrial flutter  BP meds Including metoprolol have been held ? Due to low BP  Currently does not note palpitations, chest pain or dyspnea.  Echo shows decreased EF 40-45% since preop  CAD with history of multiple stent  He underwent bypass grafting x2 with LIMA to the LAD and vein graft to the first obtuse marginal branch of the left circumflex. He maintained sinus rhythm and was discharged 04/18/2014. Surgery done by Dr Roxy Manns No PAF post op Known moderate RICA stenosis preop and no change on duplex 09/25/13  40-59%   Visual changes have improved and no issues with reading No speech or motor deficits   ROS All other systems reviewed and negative except as noted above  Past Medical History  Diagnosis Date  . CAD  (coronary artery disease)   . HTN (hypertension)   . Prostatitis   . Hyperlipidemia   . Obesity   . Diverticular disease   . Gallstones   . Hepatitis age 31  . Arthritis   . Sleep apnea     mild, CPAP wasn't indicated (per pt)--2014 mod-severe, CPAP rec  . Benign prostatic hypertrophy     followed by Dr. Gaynelle Arabian  . Cerebrovascular disease   . Sepsis due to Escherichia coli ? 2011    DUE TO URINARY OUTLET OBSTRUCTION /INFECTION  . UTI (urinary tract infection)     secondary  . Allergy     RHINITIS  . 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 Stanford Breed); s/p stents  . GERD (gastroesophageal reflux disease)   . Hypogonadism male     treated by Dr. Gaynelle Arabian  . Erectile dysfunction   . Peripheral vascular disease     02/28/12 MOST RECENT CAROTID DUPLEX--"STABLE MILD CALCIFIED PLAQUE, BILATERALY, STABLE, OVER SERIAL EXAMS"  . 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 IFG (impaired fasting glucose)   . Pneumonia     hx of  . 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 Coronary artery disease Mother     deceased  . Dementia Mother   . Atrial fibrillation Mother   . Stroke Mother  related to atrial fib  . Coronary artery disease Father     deceased  . Hypertension Father   . Heart disease Father   . Cancer Paternal Uncle     colon; died in his 48's  . Colon cancer Paternal Uncle   . Hypertension Paternal Grandmother   . Heart disease Paternal Grandmother   . Hypertension Paternal Grandfather   . Heart disease Paternal Grandfather   . Diabetes Neg Hx     History   Social History  . Marital Status: Married    Spouse Name: N/A    Number of Children: 4  . Years of Education: N/A   Occupational History  . CPA    Social History Main Topics  . Smoking status: Former Smoker -- 1.50 packs/day for 30 years    Types: Cigarettes    Quit date: 09/16/1986  . Smokeless tobacco:  Never Used     Comment: 35 pack year history, none in 18 years  . Alcohol Use: Yes     Comment: 1 glass of wine twice a week (previously daily)  . Drug Use: No  . Sexual Activity: Not Currently   Other Topics Concern  . Not on file   Social History Narrative   Lives with wife.  4 kids, 1 stepchild (1 in [place], 4 in [place]). No pets. Wife is a diabetic    Past Surgical History  Procedure Laterality Date  . Total knee arthroplasty  L 4/06, R 4/07  . Vasectomy    . Tonsillectomy    . Transurethral resection of prostate  06/2010  . Knee surgery  05/2010    removing scar tissue R knee (Dr. Wynelle Link)  . Coronary angioplasty with stent placement  01/1997    PTCA/stent to RCA  AND 2009 2ND STENT PLACED  . Joint replacement      both knees  . Total knee revision  09/23/2012    Procedure: TOTAL KNEE REVISION;  Surgeon: Gearlean Alf, MD;  Location: [facility];  Service: Orthopedics;  Laterality: Right;  . Coronary artery bypass graft N/A 04/15/2014    Procedure: CORONARY ARTERY BYPASS GRAFTING TIMES TWO USEING LEFT INTERNAL MAMMARY ARTERY AND RIGHT GREATER SAPHENOUS VEIN VIA ENDOVEIN HARVEST;  Surgeon: Rexene Alberts, MD;  Location: [facility];  Service: Open Heart Surgery;  Laterality: N/A;  . Intraoperative transesophageal echocardiogram N/A 04/15/2014    Procedure: INTRAOPERATIVE TRANSESOPHAGEAL ECHOCARDIOGRAM;  Surgeon: Rexene Alberts, MD;  Location: [facility];  Service: Open Heart Surgery;  Laterality: N/A;  . Left and right heart catheterization with coronary angiogram  04/13/2014    Procedure: LEFT AND RIGHT HEART CATHETERIZATION WITH CORONARY ANGIOGRAM;  Surgeon: Sinclair Grooms, MD;  Location: [facility];  Service: Cardiovascular;;     . apixaban  5 mg Oral BID  . aspirin EC  81 mg Oral Daily  . atorvastatin  80 mg Oral q1800      Physical Exam: Blood pressure 139/59, pulse 80, temperature 98 F (36.7 C), temperature source Oral, resp. rate 20, height 5\' 7"  (1.702 m), weight 118.48 kg (261 lb  3.2 oz), SpO2 97 %.   Affect appropriate Obese white male  HEENT: normal Neck supple with no adenopathy JVP normal no bruits no thyromegaly Lungs clear with no wheezing and good diaphragmatic motion Heart:  S1/S2 AV sclerosis murmur no AR  murmur, no rub, gallop or click PMI normal Abdomen: benighn, BS positve, no tenderness, no AAA no bruit.  No HSM or HJR  Distal pulses intact with no bruits No edema Neuro non-focal Skin warm and dry No muscular weakness   Labs:   Lab Results  Component Value Date   WBC 12.8* 09/24/2014   HGB 15.0 09/24/2014   HCT 44.0 09/24/2014   MCV 72.3* 09/24/2014   PLT 260 09/24/2014    Recent Labs Lab 09/24/14 1408 09/24/14 1418  NA 137 139  K 3.9 3.8  CL 104 101  CO2 24  --   BUN 13 15  CREATININE 0.92 0.90  CALCIUM 8.8  --   PROT 7.2  --   BILITOT 0.6  --   ALKPHOS 44  --   ALT 19  --   AST 23  --   GLUCOSE 123* 126*   Lab Results  Component Value Date   CKTOTAL 219 03/13/2010   CKMB 44.6* 05/20/2008   TROPONINI * 05/20/2008    6.06        POSSIBLE MYOCARDIAL ISCHEMIA. SERIAL TESTING RECOMMENDED. CRITICAL VALUE NOTED.  VALUE IS CONSISTENT WITH PREVIOUSLY REPORTED AND CALLED VALUE.    Lab Results  Component Value Date   CHOL 110 09/25/2014   CHOL 85 04/14/2014   CHOL 96 04/08/2014   Lab Results  Component Value Date   HDL 38* 09/25/2014   HDL 33* 04/14/2014   HDL 39* 04/08/2014   Lab Results  Component Value Date   LDLCALC 51 09/25/2014   LDLCALC 29 04/14/2014   LDLCALC 36 04/08/2014   Lab Results  Component Value Date   TRIG 104 09/25/2014   TRIG 114 04/14/2014   TRIG 103 04/08/2014   Lab Results  Component Value Date   CHOLHDL 2.9 09/25/2014   CHOLHDL 2.6 04/14/2014   CHOLHDL 2.5 04/08/2014   No results found for: LDLDIRECT    Radiology: Ct Head Wo Contrast  09/24/2014   CLINICAL DATA:  Code stroke. Onset of headache at 10 a.m. Persistent visual disturbance.  EXAM: CT HEAD WITHOUT CONTRAST   TECHNIQUE: Contiguous axial images were obtained from the base of the skull through the vertex without intravenous contrast.  COMPARISON:  Brain MRI - 06/01/2004  FINDINGS: There is mild atrophy with centralized volume loss and mild ex vacuo dilatation of the ventricular system. Scattered minimal paranasal hypodensities compatible microvascular ischemic disease. The gray-white differentiation is otherwise well maintained without CT evidence of acute large territory infarct. No intraparenchymal extra-axial mass or hemorrhage. No midline shift. Intracranial atherosclerosis.  Limited visualization the paranasal sinuses and mastoid air cells are normal. No air-fluid levels. Regional soft tissues appear normal. Mild hyperostosis frontalis. No displaced calvarial fracture.  IMPRESSION: Mild atrophy and microvascular ischemic disease without acute intracranial process.  Critical Value/emergent results were called by telephone at the time of interpretation on 09/24/2014 at 2:23 pm to Dr. Janann Colonel, who verbally acknowledged these results.   Electronically Signed   By: Sandi Mariscal M.D.   On: 09/24/2014 14:24   Mr Brain Wo Contrast  09/24/2014   CLINICAL DATA:  Stroke. Acute onset right peripheral field vision CT. Left-sided headache. Weakness. New onset atrial flutter.  EXAM: MRI HEAD WITHOUT CONTRAST  MRA HEAD WITHOUT CONTRAST  TECHNIQUE: Multiplanar, multiecho pulse sequences of the brain and surrounding structures were obtained without intravenous contrast. Angiographic images of the head were obtained using MRA technique without contrast.  COMPARISON:  Head CT 09/24/2014 and MRI 06/01/2004  FINDINGS: MRI HEAD FINDINGS  There are small areas of acute cortical and subcortical infarction involving the superomedial and inferolateral aspects of the left  occipital lobe. There is a single punctate focus of susceptibility artifact in the medial left occipital lobe suggestive of microhemorrhage. There is mild generalized cerebral  atrophy. Patchy T2 hyperintensities in the periventricular white matter have mildly progressed from the prior MRI and are nonspecific but compatible with mild chronic small vessel ischemic disease.  Orbits are unremarkable. Paranasal sinuses are clear. No significant mastoid air cell fluid is seen. Major intracranial vascular flow voids are preserved.  MRA HEAD FINDINGS  The visualized distal vertebral arteries are patent and codominant. PICA origins are patent. Right AICA and bilateral SCA origins are patent. Basilar artery is patent without stenosis. PCAs are patent without proximal stenosis. Mild irregularity and attenuation are noted involving bilateral PCA branch vessels, greater on the left. Posterior communicating arteries are not clearly identified.  Internal carotid arteries are patent from skullbase to carotid termini without stenosis. ACAs and MCAs are unremarkable aside from mild branch vessel irregularity. No intracranial aneurysm is identified.  IMPRESSION: 1. Small, acute left occipital lobe infarcts. 2. Mild chronic small vessel ischemic disease. 3. No evidence of major intracranial arterial occlusion or significant proximal stenosis. Branch vessel irregularity suggestive of atherosclerosis.   Electronically Signed   By: Logan Bores   On: 09/24/2014 17:22   Mr Jodene Nam Head/brain Wo Cm  09/24/2014   CLINICAL DATA:  Stroke. Acute onset right peripheral field vision CT. Left-sided headache. Weakness. New onset atrial flutter.  EXAM: MRI HEAD WITHOUT CONTRAST  MRA HEAD WITHOUT CONTRAST  TECHNIQUE: Multiplanar, multiecho pulse sequences of the brain and surrounding structures were obtained without intravenous contrast. Angiographic images of the head were obtained using MRA technique without contrast.  COMPARISON:  Head CT 09/24/2014 and MRI 06/01/2004  FINDINGS: MRI HEAD FINDINGS  There are small areas of acute cortical and subcortical infarction involving the superomedial and inferolateral aspects of the  left occipital lobe. There is a single punctate focus of susceptibility artifact in the medial left occipital lobe suggestive of microhemorrhage. There is mild generalized cerebral atrophy. Patchy T2 hyperintensities in the periventricular white matter have mildly progressed from the prior MRI and are nonspecific but compatible with mild chronic small vessel ischemic disease.  Orbits are unremarkable. Paranasal sinuses are clear. No significant mastoid air cell fluid is seen. Major intracranial vascular flow voids are preserved.  MRA HEAD FINDINGS  The visualized distal vertebral arteries are patent and codominant. PICA origins are patent. Right AICA and bilateral SCA origins are patent. Basilar artery is patent without stenosis. PCAs are patent without proximal stenosis. Mild irregularity and attenuation are noted involving bilateral PCA branch vessels, greater on the left. Posterior communicating arteries are not clearly identified.  Internal carotid arteries are patent from skullbase to carotid termini without stenosis. ACAs and MCAs are unremarkable aside from mild branch vessel irregularity. No intracranial aneurysm is identified.  IMPRESSION: 1. Small, acute left occipital lobe infarcts. 2. Mild chronic small vessel ischemic disease. 3. No evidence of major intracranial arterial occlusion or significant proximal stenosis. Branch vessel irregularity suggestive of atherosclerosis.   Electronically Signed   By: Logan Bores   On: 09/24/2014 17:22    EKG:  ATrial flutter no acute ST segment changes    ASSESSMENT AND PLAN:  CVA:  From atrial flutter  Does not need plavix  Decrease ASA to 81 mg Discussed with nurse need to give first dose of eliquis  Will not be able to be d/c until 3rd dose.  Wrote for lovenox per pharmacy ? Tonight and in am  should be sufficient to cover until 3 rd dose of eliquis  Atrial Flutter:  Resume lopressor 12.5 bid  BP is fine to elevated now  F/u with Dr Stanford Breed consider [facility] in  4 weeks not acutely as he is asymptomatic from cardiac perspective and recent CVA HTN:  Holding diuretic resume beta blocker at lower dose Chol:  On statin and research study drug through [place]  CAD:  Stable no chest pain or ECG changes will need f/u EF as current echo suggests decrease since revascularization 81 mg ASA and beta blocker   Has outpatient f/u with Dr Stanford Breed in a week or two already scheduled  Signed: Jenkins Rouge 09/26/2014, 3:47 PM

## 2014-09-26 NOTE — Progress Notes (Signed)
ANTICOAGULATION CONSULT NOTE - Initial Consult  Pharmacy Consult for Lovenox Indication: atrial fibrillation  No Known Allergies  Patient Measurements: Height: 5\' 7"  (170.2 cm) Weight: 261 lb 3.2 oz (118.48 kg) IBW/kg (Calculated) : 66.1 Heparin Dosing Weight:    Vital Signs: Temp: 98 F (36.7 C) (01/11 1313) Temp Source: Oral (01/11 1313) BP: 139/59 mmHg (01/11 1313) Pulse Rate: 80 (01/11 1313)  Labs:  Recent Labs  09/24/14 1408 09/24/14 1418  HGB 11.4* 15.0  HCT 40.0 44.0  PLT 260  --   APTT 32  --   LABPROT 14.0  --   INR 1.07  --   CREATININE 0.92 0.90    Estimated Creatinine Clearance: 90.1 mL/min (by C-G formula based on Cr of 0.9).   Medical History: Past Medical History  Diagnosis Date  . CAD (coronary artery disease)   . HTN (hypertension)   . Prostatitis   . Hyperlipidemia   . Obesity   . Diverticular disease   . Gallstones   . Hepatitis age 74  . Arthritis   . Sleep apnea     mild, CPAP wasn't indicated (per pt)--2014 mod-severe, CPAP rec  . Benign prostatic hypertrophy     followed by Dr. Gaynelle Arabian  . Cerebrovascular disease   . Sepsis due to Escherichia coli ? 2011    DUE TO URINARY OUTLET OBSTRUCTION /INFECTION  . UTI (urinary tract infection)     secondary  . Allergy     RHINITIS  . 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 Stanford Breed); s/p stents  . GERD (gastroesophageal reflux disease)   . Hypogonadism male     treated by Dr. Gaynelle Arabian  . Erectile dysfunction   . Peripheral vascular disease     02/28/12 MOST RECENT CAROTID DUPLEX--"STABLE MILD CALCIFIED PLAQUE, BILATERALY, STABLE, OVER SERIAL EXAMS"  . 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 IFG (impaired fasting glucose)   . Pneumonia     hx of  . 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 Have discussed LMWH overlap with Dr. Johnsie Cancel. No evidence but he  feels compelled to overlap with acute CVA until 3rd dose.   Goal of Therapy:  Anti-Xa level 0.6-1 units/ml 4hrs after LMWH dose given Monitor platelets by anticoagulation protocol: Yes   Plan:  Lovenox 120mg  SQ BID x 2 doses Eliquis 5mg  BID   Iara Monds S. Alford Highland, PharmD, BCPS Clinical Staff Pharmacist Pager [phone]  Eilene Ghazi Stillinger 09/26/2014,4:31 PM

## 2014-09-26 NOTE — Progress Notes (Signed)
Chaplain assisted in completion of advanced directive. Copy placed in chart.   09/26/14 1400  Clinical Encounter Type  Visited With Patient and family together;Health care provider  Visit Type Follow-up;Spiritual support  Referral From Nurse  Advance Directives (For Healthcare)  Does patient have an advance directive? Yes  Copy of advanced directive(s) in chart? Yes  Alaia Lordi, Barbette Hair, Chaplain 09/26/2014 2:35 PM

## 2014-09-26 NOTE — Progress Notes (Signed)
STROKE TEAM PROGRESS NOTE   HISTORY Jeffrey Wiggins is a 74 y.o. male presenting with complaints of light headedness associated with headache and visual disturbance. The visual disturbance is described as loss of the right visual field. He reports symptoms are in both eyes. Headache described as left sided pressure type pain that has resolved. No prior history of symptoms.  In ED found to have new onset A fibrillation. Head CT imaging completed and overall unremarkable.   Date last known well: 09/24/2014 Time last known well: 1000 tPA Given: no, mildness of symptoms (initial NIHSS of 1)   SUBJECTIVE (INTERVAL HISTORY) Family member at the bedside. The patient feels his vision is recovered. No complaints. Wants to go home..   OBJECTIVE Temp:  [98 F (36.7 C)-99.5 F (37.5 C)] 98 F (36.7 C) (01/11 1313) Pulse Rate:  [71-96] 80 (01/11 1313) Cardiac Rhythm:  [-] Atrial flutter (01/10 2000) Resp:  [16-20] 20 (01/11 1313) BP: (109-142)/(52-72) 139/59 mmHg (01/11 1313) SpO2:  [96 %-98 %] 97 % (01/11 1313)  No results for input(s): GLUCAP in the last 168 hours.  Recent Labs Lab 09/24/14 1408 09/24/14 1418  NA 137 139  K 3.9 3.8  CL 104 101  CO2 24  --   GLUCOSE 123* 126*  BUN 13 15  CREATININE 0.92 0.90  CALCIUM 8.8  --     Recent Labs Lab 09/24/14 1408  AST 23  ALT 19  ALKPHOS 44  BILITOT 0.6  PROT 7.2  ALBUMIN 3.9    Recent Labs Lab 09/24/14 1408 09/24/14 1418  WBC 12.8*  --   NEUTROABS 10.9*  --   HGB 11.4* 15.0  HCT 40.0 44.0  MCV 72.3*  --   PLT 260  --    No results for input(s): CKTOTAL, CKMB, CKMBINDEX, TROPONINI in the last 168 hours.  Recent Labs  09/24/14 1408  LABPROT 14.0  INR 1.07    Recent Labs  09/24/14 1430  COLORURINE YELLOW  LABSPEC 1.019  PHURINE 6.0  GLUCOSEU NEGATIVE  HGBUR NEGATIVE  BILIRUBINUR NEGATIVE  KETONESUR NEGATIVE  PROTEINUR NEGATIVE  UROBILINOGEN 0.2  NITRITE NEGATIVE  LEUKOCYTESUR NEGATIVE        Component Value Date/Time   CHOL 110 09/25/2014 0652   TRIG 104 09/25/2014 0652   HDL 38* 09/25/2014 0652   CHOLHDL 2.9 09/25/2014 0652   VLDL 21 09/25/2014 0652   LDLCALC 51 09/25/2014 0652   Lab Results  Component Value Date   HGBA1C 6.0* 09/25/2014      Component Value Date/Time   LABOPIA NONE DETECTED 09/24/2014 1430   COCAINSCRNUR NONE DETECTED 09/24/2014 1430   LABBENZ NONE DETECTED 09/24/2014 1430   AMPHETMU NONE DETECTED 09/24/2014 1430   THCU NONE DETECTED 09/24/2014 1430   LABBARB NONE DETECTED 09/24/2014 1430     Recent Labs Lab 09/24/14 1408  ETH <5    Ct Head Wo Contrast 09/24/2014    Mild atrophy and microvascular ischemic disease without acute intracranial process.     MRI / MRA Brain Wo Contrast 09/24/2014    1. Small, acute left occipital lobe infarcts.  2. Mild chronic small vessel ischemic disease.  3. No evidence of major intracranial arterial occlusion or significant proximal stenosis. Branch vessel irregularity suggestive of atherosclerosis.       PHYSICAL EXAM Pleasant middle-aged Caucasian male currently not in distress.Awake alert. Afebrile. Head is nontraumatic. Neck is supple without bruit. Hearing is normal. Cardiac exam no murmur or gallop. Lungs are clear to auscultation. Distal  pulses are well felt. Neurological Exam ;  Awake  Alert oriented x 3. Normal speech and language.eye movements full without nystagmus.fundi were not visualized. Vision acuity and fields appear normal. Hearing is normal. Palatal movements are normal. Face symmetric. Tongue midline. Normal strength, tone, reflexes and coordination. Normal sensation. Gait deferred.    ASSESSMENT/PLAN [name] is a 74 y.o. male with history of hyperlipidemia, obesity, obstructive sleep apnea, hepatitis, cerebrovascular disease, coronary artery disease, peripheral vascular disease, and new onset of atrial flutter presenting with visual disturbance and headache. He did not  receive IV t-PA due to minimal deficits.   Strokes:  Dominant infarcts probably embolic secondary to atrial flutter.  Resultant  visual deficits improving.  MRI  as above  MRA  as above   Carotid Doppler 40-59% right ICA stenosis. 1-39% left ICA stenosis. Vertebral artery flow is antegrade.   2D Echo  Left ventricle: The cavity size was normal. Wall thickness was normal. Systolic function was mildly to moderately reduced. The estimated ejection fraction was in the range of 40% to 45%. Diffuse hypokinesis.  LDL 51   HgbA1c 5.6  Lovenox for VTE prophylaxis  Diet Heart with thin  liquids  aspirin 325 mg orally every day prior to admission, now on aspirin 325 mg orally every day and clopidogrel 75 mg orally every day  Ongoing aggressive stroke risk factor management  Therapy recommendations:  No follow-up physical therapy recommended  Disposition:  home Hypertension  Home meds:  Lopressor 25 mg twice a day. ( On hold )  BP runs slow Permissive hypertension <220/120 for 24-48 hours and then gradually normalize within 5-7 days   Hyperlipidemia  Home meds:  Lipitor 80 mg daily not resumed in hospital  LDL 51, goal < 70  Restart Lipitor when appropriate   Continue statin at discharge   Other Stroke Risk Factors  Advanced age  ETOH use  Obesity, Body mass index is 40.9 kg/(m^2).   Family hx stroke (his mother had a stroke related to atrial fibrillation)  Coronary artery disease  Obstructive sleep apnea, on CPAP at home  New onset atrial flutter  Other Active Problems  Anticoagulation for atrial fibrillation to be discussed   Atrial fibrillation documented with strips on telemetry  ? Start Eliquis ?  Other Pertinent History  Previous coronary artery bypass graft surgery    Hospital day # 2  . The patient has clearly had a cardioembolic left occipital infarcts in the setting of new onset atrial fibrillation/flutter. He needs to be on  anticoagulation with Eliquis.. Recommend discontinuing Plavix if okay with his cardiologist. and I had a long discussion with the patient and family members about  his stroke, risk for recurrence, risk factor modification and answered questions. Follow-up with me in the stroke clinic in 2 months or call earlier if necessary  Antony Contras, [place] Pager: [phone] 09/26/2014 1:24 PM    To contact Stroke Continuity provider, please refer to [email or web address] After hours, contact General Neurology

## 2014-09-26 NOTE — Discharge Instructions (Signed)

## 2014-09-26 NOTE — Progress Notes (Signed)
ANTICOAGULATION CONSULT NOTE - Initial Consult  Pharmacy Consult for apixaban Indication: atrial fibrillation  No Known Allergies  Patient Measurements: Height: 5\' 7"  (170.2 cm) Weight: 261 lb 3.2 oz (118.48 kg) IBW/kg (Calculated) : 66.1  Vital Signs: Temp: 98 F (36.7 C) (01/11 1313) Temp Source: Oral (01/11 1313) BP: 139/59 mmHg (01/11 1313) Pulse Rate: 80 (01/11 1313)  Labs:  Recent Labs  09/24/14 1408 09/24/14 1418  HGB 11.4* 15.0  HCT 40.0 44.0  PLT 260  --   APTT 32  --   LABPROT 14.0  --   INR 1.07  --   CREATININE 0.92 0.90    Estimated Creatinine Clearance: 90.1 mL/min (by C-G formula based on Cr of 0.9).   Medical History: Past Medical History  Diagnosis Date  . CAD (coronary artery disease)   . HTN (hypertension)   . Prostatitis   . Hyperlipidemia   . Obesity   . Diverticular disease   . Gallstones   . Hepatitis age 73  . Arthritis   . Sleep apnea     mild, CPAP wasn't indicated (per pt)--2014 mod-severe, CPAP rec  . Benign prostatic hypertrophy     followed by Dr. Gaynelle Arabian  . Cerebrovascular disease   . Sepsis due to Escherichia coli ? 2011    DUE TO URINARY OUTLET OBSTRUCTION /INFECTION  . UTI (urinary tract infection)     secondary  . Allergy     RHINITIS  . 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 Stanford Breed); s/p stents  . GERD (gastroesophageal reflux disease)   . Hypogonadism male     treated by Dr. Gaynelle Arabian  . Erectile dysfunction   . Peripheral vascular disease     02/28/12 MOST RECENT CAROTID DUPLEX--"STABLE MILD CALCIFIED PLAQUE, BILATERALY, STABLE, OVER SERIAL EXAMS"  . 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 IFG (impaired fasting glucose)   . Pneumonia     hx of  . 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 Found to have had a cardioembolic L occipital infarct in the setting of new onset afib. To  start anticoagulation with apixaban. H/H and platelets are WNL. Pt has good renal fxn as well. He also continues on full dose aspirin. Plavix was discontinued.   Goal of Therapy:  Therapeutic anticoagulation Monitor platelets by anticoagulation protocol: Yes   Plan:  1. Apixaban 5mg  PO BID 2. MD - Please address if full dose aspirin should continue while on apixaban  *Pharmacy will sign-off and follow peripherally. Will educate pt on apixaban usage. Thank you for the consult  Cicely Ortner, Rande Lawman 09/26/2014,1:53 PM

## 2014-09-26 NOTE — Progress Notes (Signed)
Chaplain referred to pt who would like to complete advanced directive. Chaplain reviewed advanced directive with pt. Chaplain will arrange notarization for this afternoon.   09/26/14 1200  Clinical Encounter Type  Visited With Patient and family together  Visit Type Initial;Spiritual support  Spiritual Encounters  Spiritual Needs Emotional  Stress Factors  Patient Stress Factors Health changes  Jeffrey Wiggins, Barbette Hair, Chaplain 09/26/2014 12:11 PM

## 2014-09-27 ENCOUNTER — Telehealth: Payer: Self-pay | Admitting: Cardiovascular Disease

## 2014-09-27 NOTE — Progress Notes (Signed)
BENEFIT CHECK FOR ELIQUIS ---09/27/2014 1027 by [place] patient has [facility] for medications/ no auth required/ $50 copay for 30 day retail/ $100 copay for 90 day mail order  patient can use most major retail pharmacy  CM also gave the patient a coupon card for a 30 day supply for Eliquis

## 2014-09-27 NOTE — Progress Notes (Signed)
RT note:  Pt said he would call when ready to go cpap.  Rt will continue to monitor.

## 2014-09-27 NOTE — Progress Notes (Signed)
Patient and wife given DC instructions and handouts. All questions answered. Patient verbalized understanding and compliance. Patient escorted in [facility] by volunteers to front lobby. He will be transported home by his wife in a private vehicle.

## 2014-09-27 NOTE — Telephone Encounter (Signed)
New Message    Patients nurse on [place] @ [facility] @ Dr. Allyson Sabal  Want to know if the Dr Johnsie Cancel needs to round on him before the D/C from hospital.  Please call.

## 2014-09-27 NOTE — Telephone Encounter (Signed)
NOTIFIED  NURSE THAT  DR Johnsie Cancel  IS  IN OFFICE   AND  SHE  NEEDS TO  PAGE  TRISH   TO  GET  AN  ANSWER IF NEEDS TO BE SEEN  PRIOR TO DISCHARGE./CY

## 2014-09-27 NOTE — Discharge Summary (Addendum)
Physician Discharge Summary  Jeffrey Wiggins MRN: [id number] DOB/AGE: Dec 06, 1940 74 y.o.  PCP: [name], MD   Admit date: 09/24/2014 Discharge date: 09/27/2014  Discharge Diagnoses:      Essential hypertension   Obstructive sleep apnea   CAD (coronary artery disease), native coronary artery   DM (diabetes mellitus), type 2 with peripheral vascular complications   HLD (hyperlipidemia)   Atrial flutter   Hepatitis   CVA (cerebral infarction)   New-onset atrial flutter   Coronary artery disease involving autologous artery coronary bypass graft with unspecified angina pectoris   CVA (cerebral vascular accident)   Atherosclerotic peripheral vascular disease   Stroke   Follow-up  in the stroke clinic in 2 months  f/u with Dr Stanford Breed next week   already scheduled      Medication List    STOP taking these medications        amoxicillin 875 MG tablet  Commonly known as:  AMOXIL      TAKE these medications        acetaminophen 500 MG tablet  Commonly known as:  TYLENOL  Take 1,000 mg by mouth at bedtime as needed for mild pain.     apixaban 5 MG Tabs tablet  Commonly known as:  ELIQUIS  Take 1 tablet (5 mg total) by mouth 2 (two) times daily.     aspirin 81 MG EC tablet  Take 1 tablet (81 mg total) by mouth daily.     atorvastatin 80 MG tablet  Commonly known as:  LIPITOR  Take 80 mg by mouth every evening.     benzonatate 200 MG capsule  Commonly known as:  TESSALON  Take 1 capsule (200 mg total) by mouth 3 (three) times daily as needed.     CALCIUM-MAGNESIUM-ZINC PO  Take 1 each by mouth daily.     Co Q-10 100 MG Caps  Take 1 capsule by mouth daily.     fluticasone 50 MCG/ACT nasal spray  Commonly known as:  FLONASE  USE 2 SPRAYS INTO THE NOSE DAILY     furosemide 20 MG tablet  Commonly known as:  LASIX  Take 1 tablet (20 mg total) by mouth daily.     loratadine 10 MG tablet  Commonly known as:  CLARITIN  Take 10 mg by mouth daily.      metoprolol tartrate 25 MG tablet  Commonly known as:  LOPRESSOR  Take 1 tablet (25 mg total) by mouth 2 (two) times daily.     NITROSTAT 0.4 MG SL tablet  Generic drug:  nitroGLYCERIN  Place 0.4 mg under the tongue every 5 (five) minutes as needed for chest pain.     omeprazole 20 MG capsule  Commonly known as:  PRILOSEC  Take 20 mg by mouth every other day.     potassium chloride 10 MEQ tablet  Commonly known as:  KLOR-CON 10  Take 1 tablet (10 mEq total) by mouth daily.     STUDY MEDICATION  Take 1 tablet by mouth every evening. [employer] for cholesterol. Study medication Anacetrapib or placebo     testosterone cypionate 200 MG/ML injection  Commonly known as:  DEPOTESTOTERONE CYPIONATE  Inject 200 mg into the muscle every 14 (fourteen) days.     Vitamin D 2000 UNITS tablet  Take 2,000 Units by mouth daily.        Discharge Condition:    Disposition: 01-Home or Self Care   Consults:  Neurology Cardiology  Significant Diagnostic Studies: Ct Head Wo Contrast  09/24/2014   CLINICAL DATA:  Code stroke. Onset of headache at 10 a.m. Persistent visual disturbance.  EXAM: CT HEAD WITHOUT CONTRAST  TECHNIQUE: Contiguous axial images were obtained from the base of the skull through the vertex without intravenous contrast.  COMPARISON:  Brain MRI - 06/01/2004  FINDINGS: There is mild atrophy with centralized volume loss and mild ex vacuo dilatation of the ventricular system. Scattered minimal paranasal hypodensities compatible microvascular ischemic disease. The gray-white differentiation is otherwise well maintained without CT evidence of acute large territory infarct. No intraparenchymal extra-axial mass or hemorrhage. No midline shift. Intracranial atherosclerosis.  Limited visualization the paranasal sinuses and mastoid air cells are normal. No air-fluid levels. Regional soft tissues appear normal. Mild hyperostosis frontalis. No displaced calvarial fracture.   IMPRESSION: Mild atrophy and microvascular ischemic disease without acute intracranial process.  Critical Value/emergent results were called by telephone at the time of interpretation on 09/24/2014 at 2:23 pm to Dr. Janann Colonel, who verbally acknowledged these results.   Electronically Signed   By: Sandi Mariscal M.D.   On: 09/24/2014 14:24   Mr Brain Wo Contrast  09/24/2014   CLINICAL DATA:  Stroke. Acute onset right peripheral field vision CT. Left-sided headache. Weakness. New onset atrial flutter.  EXAM: MRI HEAD WITHOUT CONTRAST  MRA HEAD WITHOUT CONTRAST  TECHNIQUE: Multiplanar, multiecho pulse sequences of the brain and surrounding structures were obtained without intravenous contrast. Angiographic images of the head were obtained using MRA technique without contrast.  COMPARISON:  Head CT 09/24/2014 and MRI 06/01/2004  FINDINGS: MRI HEAD FINDINGS  There are small areas of acute cortical and subcortical infarction involving the superomedial and inferolateral aspects of the left occipital lobe. There is a single punctate focus of susceptibility artifact in the medial left occipital lobe suggestive of microhemorrhage. There is mild generalized cerebral atrophy. Patchy T2 hyperintensities in the periventricular white matter have mildly progressed from the prior MRI and are nonspecific but compatible with mild chronic small vessel ischemic disease.  Orbits are unremarkable. Paranasal sinuses are clear. No significant mastoid air cell fluid is seen. Major intracranial vascular flow voids are preserved.  MRA HEAD FINDINGS  The visualized distal vertebral arteries are patent and codominant. PICA origins are patent. Right AICA and bilateral SCA origins are patent. Basilar artery is patent without stenosis. PCAs are patent without proximal stenosis. Mild irregularity and attenuation are noted involving bilateral PCA branch vessels, greater on the left. Posterior communicating arteries are not clearly identified.  Internal  carotid arteries are patent from skullbase to carotid termini without stenosis. ACAs and MCAs are unremarkable aside from mild branch vessel irregularity. No intracranial aneurysm is identified.  IMPRESSION: 1. Small, acute left occipital lobe infarcts. 2. Mild chronic small vessel ischemic disease. 3. No evidence of major intracranial arterial occlusion or significant proximal stenosis. Branch vessel irregularity suggestive of atherosclerosis.   Electronically Signed   By: Logan Bores   On: 09/24/2014 17:22   Mr Jodene Nam Head/brain Wo Cm  09/24/2014   CLINICAL DATA:  Stroke. Acute onset right peripheral field vision CT. Left-sided headache. Weakness. New onset atrial flutter.  EXAM: MRI HEAD WITHOUT CONTRAST  MRA HEAD WITHOUT CONTRAST  TECHNIQUE: Multiplanar, multiecho pulse sequences of the brain and surrounding structures were obtained without intravenous contrast. Angiographic images of the head were obtained using MRA technique without contrast.  COMPARISON:  Head CT 09/24/2014 and MRI 06/01/2004  FINDINGS: MRI HEAD FINDINGS  There are small areas of acute cortical  and subcortical infarction involving the superomedial and inferolateral aspects of the left occipital lobe. There is a single punctate focus of susceptibility artifact in the medial left occipital lobe suggestive of microhemorrhage. There is mild generalized cerebral atrophy. Patchy T2 hyperintensities in the periventricular white matter have mildly progressed from the prior MRI and are nonspecific but compatible with mild chronic small vessel ischemic disease.  Orbits are unremarkable. Paranasal sinuses are clear. No significant mastoid air cell fluid is seen. Major intracranial vascular flow voids are preserved.  MRA HEAD FINDINGS  The visualized distal vertebral arteries are patent and codominant. PICA origins are patent. Right AICA and bilateral SCA origins are patent. Basilar artery is patent without stenosis. PCAs are patent without proximal  stenosis. Mild irregularity and attenuation are noted involving bilateral PCA branch vessels, greater on the left. Posterior communicating arteries are not clearly identified.  Internal carotid arteries are patent from skullbase to carotid termini without stenosis. ACAs and MCAs are unremarkable aside from mild branch vessel irregularity. No intracranial aneurysm is identified.  IMPRESSION: 1. Small, acute left occipital lobe infarcts. 2. Mild chronic small vessel ischemic disease. 3. No evidence of major intracranial arterial occlusion or significant proximal stenosis. Branch vessel irregularity suggestive of atherosclerosis.   Electronically Signed   By: Logan Bores   On: 09/24/2014 17:22     Microbiology: No results found for this or any previous visit (from the past 240 hour(s)).   Labs: No results found for this or any previous visit (from the past 48 hour(s)). Ct Head Wo Contrast 09/24/2014  Mild atrophy and microvascular ischemic disease without acute intracranial process.    MRI / MRA Brain Wo Contrast 09/24/2014  1. Small, acute left occipital lobe infarcts.  2. Mild chronic small vessel ischemic disease.  3. No evidence of major intracranial arterial occlusion or significant proximal stenosis. Branch vessel irregularity suggestive of atherosclerosis   HPI :Jeffrey Wiggins is a 74 y.o. male presenting with complaints of light headedness associated with headache and visual disturbance. The visual disturbance is described as loss of the right visual field. He reports symptoms are in both eyes. Headache described as left sided pressure type pain that has resolved. No prior history of symptoms.  In ED found to have new onset A fibrillation. Head CT imaging completed and overall unremarkable.      HOSPITAL COURSE:   74 y.o. male with history of hyperlipidemia, obesity, obstructive sleep apnea, hepatitis, cerebrovascular disease, coronary artery disease, peripheral vascular  disease, and new onset of atrial flutter presenting with visual disturbance and headache. He did not receive IV t-PA due to minimal deficits.   Strokes: Dominant infarcts probably embolic secondary to atrial flutter.  Resultant visual deficits improving.  MRI as above  MRA as above   Carotid Doppler 40-59% right ICA stenosis. 1-39% left ICA stenosis. Vertebral artery flow is antegrade.  2D Echo Left ventricle: The cavity size was normal. Wall thickness was normal. Systolic function was mildly to moderately reduced. The estimated ejection fraction was in the range of 40% to 45%. Diffuse hypokinesis.  LDL 51   HgbA1c 5.6  Lovenox for VTE prophylaxis  Diet Heart with thin liquids  aspirin 325 mg orally every day prior to admission, now on aspirin 325 mg orally every day and clopidogrel 75 mg orally every day Per Neurology  patient has clearly had a cardioembolic left occipital infarcts in the setting of new onset atrial fibrillation/flutter. He needs to be on anticoagulation with Eliquis.. Recommend discontinuing  Plavix if okay with his cardiologist. and I had a long discussion with the patient and family members about his stroke, risk for recurrence, risk factor modification and answered questions. Follow-up with neurology  in the stroke clinic in 2 months . Cardiology consulted prior to DC to assess for a fib and to review anticoagulation and antiplatelet therpy. Cardiology recommended 3 doses of Eliquis prior to discharge home. Continue with beta blocker. Follow-up with Dr. Stanford Breed to consider [facility] in 4 weeks  Therapy recommendations: No follow-up physical therapy recommended Disposition: home    Hypertension  Home meds: Lopressor 25 mg twice a day. ( On hold )  BP runs slow  Permissive hypertension <220/120 for 24-48 hours and then gradually normalize within 5-7 days   Hyperlipidemia  Home meds: Lipitor 80 mg daily   LDL 51, goal < 70  Restart  Lipitor        Other Stroke Risk Factors  Advanced age  ETOH use  Obesity, Body mass index is 40.9 kg/(m^2).   Family hx stroke (his mother had a stroke related to atrial fibrillation)  Coronary artery disease  Obstructive sleep apnea, on CPAP at home  New onset atrial flutter   Other Pertinent History  Previous coronary artery bypass graft surgery      Discharge Exam:  Blood pressure 137/60, pulse 53, temperature 97.7 F (36.5 C), temperature source Oral, resp. rate 18, height 5\' 7"  (1.702 m), weight 118.48 kg (261 lb 3.2 oz), SpO2 98 %.  General: A/O 4, NAD Cardiovascular: Regular rhythm and rate, negative murmurs rubs gallops, normal S1/S2 Respiratory: Clear to auscultation bilateral Abdomen: Obese, soft, nontender, plus bowel sound Musculoskeletal: Negative ecchymosis, negative edema, Neurologic: Cranial nerves II through XII intact, tongue/uvula midline, extremity strength 5/5, sensation intact, left DTR 2+ at patella, right DTR at patella absent (S/P TKA) throughout   Discharge Instructions    Ambulatory referral to Neurology    Complete by:  As directed   Stroke patient. Dr. Leonie Man prefers follow up in 1 month     Diet - low sodium heart healthy    Complete by:  As directed      Increase activity slowly    Complete by:  As directed            Follow-up Information    Follow up with KNAPP,EVE A, MD. Schedule an appointment as soon as possible for a visit in 1 week.   Specialty:  Family Medicine   Contact information:   [address] [place] [address] [phone]       Follow up with SETHI,PRAMOD, MD. Schedule an appointment as soon as possible for a visit in 2 months.   Specialties:  Neurology, Radiology   Contact information:   [address] [place] [address] [phone]       Signed: Reyne Dumas 09/27/2014, 11:08 AM

## 2014-09-27 NOTE — Progress Notes (Signed)
    Subjective:  No complaints  Objective:  Vital Signs in the last 24 hours: Temp:  [97.7 F (36.5 C)-98.7 F (37.1 C)] 97.7 F (36.5 C) (01/12 1010) Pulse Rate:  [51-78] 53 (01/12 1010) Resp:  [16-20] 18 (01/12 1010) BP: (128-139)/(59-74) 137/60 mmHg (01/12 1010) SpO2:  [97 %-100 %] 98 % (01/12 1010)  Intake/Output from previous day:  Intake/Output Summary (Last 24 hours) at 09/27/14 1416 Last data filed at 09/26/14 1823  Gross per 24 hour  Intake    720 ml  Output      0 ml  Net    720 ml    Physical Exam: General appearance: alert, cooperative, no distress and moderately obese Lungs: clear to auscultation bilaterally Heart: irregularly irregular rhythm   Rate: 70  Rhythm: atrial fibrillation and atrial flutter  Lab Results:  Recent Labs  09/24/14 1418  HGB 15.0    Recent Labs  09/24/14 1418  NA 139  K 3.8  CL 101  GLUCOSE 126*  BUN 15  CREATININE 0.90   No results for input(s): TROPONINI in the last 72 hours.  Invalid input(s): CK, MB No results for input(s): INR in the last 72 hours.  Imaging: Imaging results have been reviewed  Cardiac Studies: Echo 09/25/14 Study Conclusions  - Left ventricle: The cavity size was normal. Wall thickness was normal. Systolic function was mildly to moderately reduced. The estimated ejection fraction was in the range of 40% to 45%. Diffuse hypokinesis. The study is not technically sufficient to allow evaluation of LV diastolic function. - Aortic valve: Right coronary cusp mobility was severely restricted. - Mitral valve: There was mild regurgitation. - Left atrium: The atrium was severely dilated. - Right ventricle: The cavity size was mildly dilated. Systolic function was moderately reduced. - Right atrium: The atrium was moderately to severely dilated. - Atrial septum  Assessment/Plan:  74 y.o. patient of Dr Stanford Breed Admitted 1/9 with heachache and visual changes consistent with occipital  stroke Confirmed by MRI. EKG shows AF/ flutter. Eliquis added.   Active Problems:   CVA (cerebral vascular accident)   S/P CABG x 2   DM (diabetes mellitus), type 2 with peripheral vascular complications   Atrial flutter, unspecified   Atrial fibrillation, unspecified   Essential hypertension   Obstructive sleep apnea   HLD (hyperlipidemia)   PLAN: OK to discharge. F/U with Dr Stanford Breed. Resume home dose Lopressor, OK to hold Lasix till seen by Dr Stanford Breed (to avoid hypotension post CVA).   Kerin Ransom PA-C Beeper [phone] 09/27/2014, 2:16 PM   Agree with above. Did not examine patient.  EF 45%.  AFIB - Eliquis Metoprolol May need to restart lasix as outpatient.   Candee Furbish, MD

## 2014-10-01 NOTE — Progress Notes (Signed)
HPI: FU coronary artery disease, hypertension, and hyperlipidemia. Echo 7/15 showed normal LV function, grade 2 diastolic dysfunction and mild LAE. Cath with severe CAD and EF 50. He underwent bypass grafting x2 with LIMA to the LAD and vein graft to the first obtuse marginal branch of the left circumflex. Abd ultrasound 9/15 showed no aneurysm. Carotid dopplers 1/16 showed 40-59 right and 1-39 left. Echo 1/16 showed EF 40-45, severe LAE, moderate to severe RAE, mild RVE, moderate reduction in RV function and mild MR. Admitted 1/16 with a flutter and new CVA. Treated with apixaban. Since DC, he has mild dyspnea on exertion but no orthopnea or PND. Chronic mild pedal edema. No chest pain or palpitations.  Current Outpatient Prescriptions  Medication Sig Dispense Refill  . acetaminophen (TYLENOL) 500 MG tablet Take 1,000 mg by mouth at bedtime as needed for mild pain.     Marland Kitchen apixaban (ELIQUIS) 5 MG TABS tablet Take 1 tablet (5 mg total) by mouth 2 (two) times daily. 60 tablet 2  . aspirin 81 MG EC tablet Take 1 tablet (81 mg total) by mouth daily.    Marland Kitchen atorvastatin (LIPITOR) 80 MG tablet Take 80 mg by mouth every evening.    . benzonatate (TESSALON) 200 MG capsule Take 1 capsule (200 mg total) by mouth 3 (three) times daily as needed. (Patient not taking: Reported on 09/24/2014) 30 capsule 0  . CALCIUM-MAGNESIUM-ZINC PO Take 1 each by mouth daily.    . Cholecalciferol (VITAMIN D) 2000 UNITS tablet Take 2,000 Units by mouth daily.    . Coenzyme Q10 (CO Q-10) 100 MG CAPS Take 1 capsule by mouth daily.    . fluticasone (FLONASE) 50 MCG/ACT nasal spray USE 2 SPRAYS INTO THE NOSE DAILY 3 g 0  . loratadine (CLARITIN) 10 MG tablet Take 10 mg by mouth daily.     . metoprolol tartrate (LOPRESSOR) 25 MG tablet Take 1 tablet (25 mg total) by mouth 2 (two) times daily. 60 tablet 3  . NITROSTAT 0.4 MG SL tablet Place 0.4 mg under the tongue every 5 (five) minutes as needed for chest pain.     Marland Kitchen omeprazole  (PRILOSEC) 20 MG capsule Take 20 mg by mouth every other day.     . STUDY MEDICATION Take 1 tablet by mouth every evening. [employer] for cholesterol. Study medication Anacetrapib or placebo    . testosterone cypionate (DEPOTESTOTERONE CYPIONATE) 200 MG/ML injection Inject 200 mg into the muscle every 14 (fourteen) days.      No current facility-administered medications for this visit.     Past Medical History  Diagnosis Date  . CAD (coronary artery disease)   . HTN (hypertension)   . Prostatitis   . Hyperlipidemia   . Obesity   . Diverticular disease   . Gallstones   . Hepatitis age 24  . Arthritis   . Sleep apnea     mild, CPAP wasn't indicated (per pt)--2014 mod-severe, CPAP rec  . Benign prostatic hypertrophy     followed by Dr. Gaynelle Arabian  . Cerebrovascular disease   . Sepsis due to Escherichia coli ? 2011    DUE TO URINARY OUTLET OBSTRUCTION /INFECTION  . UTI (urinary tract infection)     secondary  . Allergy     RHINITIS  . 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 Stanford Breed); s/p stents  . GERD (gastroesophageal reflux disease)   . Hypogonadism male     treated by Dr. Gaynelle Arabian  .  Erectile dysfunction   . Peripheral vascular disease     02/28/12 MOST RECENT CAROTID DUPLEX--"STABLE MILD CALCIFIED PLAQUE, BILATERALY, STABLE, OVER SERIAL EXAMS"  . 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 IFG (impaired fasting glucose)   . Pneumonia     hx of  . 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 Total knee arthroplasty  L 4/06, R 4/07  . Vasectomy    . Tonsillectomy    . Transurethral resection of prostate  06/2010  . Knee surgery  05/2010    removing scar tissue R knee (Dr. Wynelle Link)  . Coronary angioplasty with stent placement  01/1997    PTCA/stent to RCA  AND 2009 2ND STENT PLACED  . Joint replacement      both knees  . Total knee revision  09/23/2012    Procedure:  TOTAL KNEE REVISION;  Surgeon: Gearlean Alf, MD;  Location: [facility];  Service: Orthopedics;  Laterality: Right;  . Coronary artery bypass graft N/A 04/15/2014    Procedure: CORONARY ARTERY BYPASS GRAFTING TIMES TWO USEING LEFT INTERNAL MAMMARY ARTERY AND RIGHT GREATER SAPHENOUS VEIN VIA ENDOVEIN HARVEST;  Surgeon: Rexene Alberts, MD;  Location: [place];  Service: Open Heart Surgery;  Laterality: N/A;  . Intraoperative transesophageal echocardiogram N/A 04/15/2014    Procedure: INTRAOPERATIVE TRANSESOPHAGEAL ECHOCARDIOGRAM;  Surgeon: Rexene Alberts, MD;  Location: [place];  Service: Open Heart Surgery;  Laterality: N/A;  . Left and right heart catheterization with coronary angiogram  04/13/2014    Procedure: LEFT AND RIGHT HEART CATHETERIZATION WITH CORONARY ANGIOGRAM;  Surgeon: Sinclair Grooms, MD;  Location: [facility];  Service: Cardiovascular;;    History   Social History  . Marital Status: Married    Spouse Name: N/A    Number of Children: 4  . Years of Education: N/A   Occupational History  . CPA    Social History Main Topics  . Smoking status: Former Smoker -- 1.50 packs/day for 30 years    Types: Cigarettes    Quit date: 09/16/1986  . Smokeless tobacco: Never Used     Comment: 35 pack year history, none in 18 years  . Alcohol Use: 0.0 oz/week    0 Not specified per week     Comment: 1 glass of wine twice a week (previously daily)  . Drug Use: No  . Sexual Activity: Not Currently   Other Topics Concern  . Not on file   Social History Narrative   Lives with wife.  4 kids, 1 stepchild (1 in [place], 4 in [place]). No pets. Wife is a diabetic    ROS: no fevers or chills, productive cough, hemoptysis, dysphasia, odynophagia, melena, hematochezia, dysuria, hematuria, rash, seizure activity, orthopnea, PND, claudication. Remaining systems are negative.  Physical Exam: Well-developed well-nourished in no acute distress.  Skin is warm and dry.  HEENT is normal.  Neck is supple.    Chest is clear to auscultation with normal expansion.  Cardiovascular exam is regular rate and rhythm.  Abdominal exam nontender or distended. No masses palpated. Extremities show 1+ edema. neuro grossly intact  ECG atrial flutter at a rate of 72. No ST changes.

## 2014-10-03 ENCOUNTER — Encounter: Payer: Self-pay | Admitting: Cardiology

## 2014-10-03 ENCOUNTER — Ambulatory Visit (INDEPENDENT_AMBULATORY_CARE_PROVIDER_SITE_OTHER): Payer: 59 | Admitting: Cardiology

## 2014-10-03 VITALS — BP 132/90 | HR 72 | Ht 67.0 in | Wt 262.4 lb

## 2014-10-03 DIAGNOSIS — I4891 Unspecified atrial fibrillation: Secondary | ICD-10-CM

## 2014-10-03 DIAGNOSIS — I4892 Unspecified atrial flutter: Secondary | ICD-10-CM

## 2014-10-03 DIAGNOSIS — I10 Essential (primary) hypertension: Secondary | ICD-10-CM | POA: Diagnosis not present

## 2014-10-03 DIAGNOSIS — I2510 Atherosclerotic heart disease of native coronary artery without angina pectoris: Secondary | ICD-10-CM | POA: Diagnosis not present

## 2014-10-03 DIAGNOSIS — I639 Cerebral infarction, unspecified: Secondary | ICD-10-CM

## 2014-10-03 MED ORDER — FUROSEMIDE 20 MG PO TABS: 20.0000 mg | ORAL_TABLET | Freq: Every day | ORAL | Status: DC

## 2014-10-03 MED ORDER — POTASSIUM CHLORIDE ER 10 MEQ PO TBCR
10.0000 meq | EXTENDED_RELEASE_TABLET | Freq: Every day | ORAL | Status: DC
Start: 2014-10-03 — End: 2015-06-27

## 2014-10-03 NOTE — Assessment & Plan Note (Signed)
Continue statin. 

## 2014-10-03 NOTE — Assessment & Plan Note (Signed)
Repeat carotid Doppler January 2017.

## 2014-10-03 NOTE — Assessment & Plan Note (Signed)
Continue statin. Discontinue aspirin given the for anticoagulation.

## 2014-10-03 NOTE — Patient Instructions (Addendum)
Your physician recommends that you schedule a follow-up appointment in: [place]   RESTART FUROSEMIDE AND POTASSIUM AS BEFORE  STOP ASPIRIN  Your physician recommends that you return for lab work in: [place]

## 2014-10-03 NOTE — Assessment & Plan Note (Signed)
Patient is mildly volume overloaded. His Lasix was not resumed during recent hospitalization. I will resume 20 mg daily and potassium 10 mEq daily. Check potassium and renal function in 1 week.

## 2014-10-03 NOTE — Assessment & Plan Note (Signed)
Blood pressure elevated. Resume Lasix and follow.

## 2014-10-03 NOTE — Assessment & Plan Note (Signed)
Patient remains in atrial flutter. Heart rate is controlled. Continue metoprolol. Continue apixaban. Given recent CVA he will need lifelong anticoagulation. I will see him back in 6 weeks and we will most likely proceed with cardioversion to see if he will hold sinus rhythm. If not rate controlled and anticoagulation may be appropriate as he is relatively asymptomatic. I will not refer for ablation as I would not be comfortable discontinuing his anticoagulation in the setting of recent CVA.

## 2014-10-31 ENCOUNTER — Inpatient Hospital Stay: Payer: 59 | Admitting: Family Medicine

## 2014-10-31 ENCOUNTER — Ambulatory Visit (INDEPENDENT_AMBULATORY_CARE_PROVIDER_SITE_OTHER): Payer: 59 | Admitting: Family Medicine

## 2014-10-31 ENCOUNTER — Encounter: Payer: Self-pay | Admitting: Internal Medicine

## 2014-10-31 ENCOUNTER — Encounter: Payer: Self-pay | Admitting: Family Medicine

## 2014-10-31 VITALS — BP 130/72 | HR 68 | Ht 67.0 in | Wt 256.0 lb

## 2014-10-31 DIAGNOSIS — Z5181 Encounter for therapeutic drug level monitoring: Secondary | ICD-10-CM

## 2014-10-31 DIAGNOSIS — Z1211 Encounter for screening for malignant neoplasm of colon: Secondary | ICD-10-CM

## 2014-10-31 DIAGNOSIS — I639 Cerebral infarction, unspecified: Secondary | ICD-10-CM

## 2014-10-31 DIAGNOSIS — I4892 Unspecified atrial flutter: Secondary | ICD-10-CM

## 2014-10-31 DIAGNOSIS — G4733 Obstructive sleep apnea (adult) (pediatric): Secondary | ICD-10-CM

## 2014-10-31 NOTE — Patient Instructions (Signed)
We will see if Cologard (fecal DNA test) is covered by your insurance--we actually need to do the referral in order to determine this. If it isn't covered, you do not need to return this, but you might want to set up an office visit with [facility] to discuss colon cancer screening options (and if okay to wait 10 years vs 5).  The recommendation for 5 year interval is based on the family history of colon cancer in your uncle.  Your EKG did NOT show atrial flutter. I will be scanning it in to the computer and forwarding it to Dr. Stanford Breed

## 2014-10-31 NOTE — Progress Notes (Signed)
Chief Complaint  Patient presents with  . Hospitalization Follow-up    follow up on admit from 09/24/14 for stroke. Has been contacted by [facility], Dr.Ganem's office for colonoscopy follow up, states that he was told by Dr.Rasheen Schewe that he was good for 10 years and currently is at the 5 year mark. Wanting to know why he is seeing Dr Leonie Man in April? He is looking for second opinion on heart flutter-scheduled to see Dr.Crenshaw in April. Also needs new rx for CPAP supplies for [facility].    He was admitted 1/9-1/11/16 with small acute left occipital lobe infarcts. His symptoms included dizziness and right visual field loss.  He was found to be in new onset atrial flutter.  He was discharged initially on 41m of aspirin and eliquis, but the aspirin was stopped by Dr. CStanford Breedat follow up on 1/18.  He was found to have mild fluid overload on his visit with Dr. CWonda Cerisehad not been discharged on his lasix.  Lasix and potassium were restarted. He was to return in a week for b-met.  He lost the paperwork, and hasn't had the bloodwork done.  They are to discuss possible cardioversion at his 6 week follow-up.He reports that his f/u is actually scheduled for April, unable to get in sooner.  He has been compliant with his lasix/potassium.  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  Other days there is no swelling at all.  Weight fluctuates 1.5-2 pounds up and down.  He sometimes has some slight dizziness as a residual from the stroke.  It is intermittent and mild.  He still has some slight shortness of breath with exertion compare to prior to the atrial flutter--comes on sooner, but pushes through it, to continue to get 30 minutes of cardio plus 25-30 minutes of strength 5x/week. Denies any bleeding/bruising (just a bruise on his abdomen from carrying a laundry basket down the stairs, which resolved).  He was notified that he is due for colonoscopy (has been 5  years) and he is wondering if he needs this.  [facility]  Colonoscopy in 9/05 had hyperplastic polyps, and 06/2009 there were no polyps (just diverticulosis).  Paternal uncle died of colon cancer in his mid-40's. He denies any bowel changes, abdominal pain, or bleeding.  PMH, PSH, SH reviewed. Outpatient Encounter Prescriptions as of 10/31/2014  Medication Sig Note  . acetaminophen (TYLENOL) 500 MG tablet Take 1,000 mg by mouth at bedtime as needed for mild pain.    .Marland Kitchenapixaban (ELIQUIS) 5 MG TABS tablet Take 1 tablet (5 mg total) by mouth 2 (two) times daily.   .Marland Kitchenatorvastatin (LIPITOR) 80 MG tablet Take 80 mg by mouth every evening.   .Marland KitchenCALCIUM-MAGNESIUM-ZINC PO Take 1 each by mouth daily.   . Cholecalciferol (VITAMIN D) 2000 UNITS tablet Take 2,000 Units by mouth daily.   . Coenzyme Q10 (CO Q-10) 100 MG CAPS Take 1 capsule by mouth daily.   .Marland KitchenFLUOROPLEX 1 % cream Apply 1 application topically 2 (two) times daily.  10/31/2014: From Dr. TDenna Haggardfor AK's on his scalp  . fluticasone (FLONASE) 50 MCG/ACT nasal spray USE 2 SPRAYS INTO THE NOSE DAILY   . furosemide (LASIX) 20 MG tablet Take 1 tablet (20 mg total) by mouth daily.   .Marland Kitchenloratadine (CLARITIN) 10 MG tablet Take 10 mg by mouth daily.    . metoprolol tartrate (LOPRESSOR) 25  MG tablet Take 1 tablet (25 mg total) by mouth 2 (two) times daily.   Marland Kitchen omeprazole (PRILOSEC) 20 MG capsule Take 20 mg by mouth every other day.    . potassium chloride (K-DUR) 10 MEQ tablet Take 1 tablet (10 mEq total) by mouth daily.   . STUDY MEDICATION Take 1 tablet by mouth every evening. [facility] for cholesterol. Study medication Anacetrapib or placebo   . testosterone cypionate (DEPOTESTOTERONE CYPIONATE) 200 MG/ML injection Inject 200 mg into the muscle every 14 (fourteen) days.    Marland Kitchen NITROSTAT 0.4 MG SL tablet Place 0.4 mg under the tongue every 5 (five) minutes as needed for chest pain.    . [DISCONTINUED] benzonatate (TESSALON)  200 MG capsule Take 1 capsule (200 mg total) by mouth 3 (three) times daily as needed. (Patient not taking: Reported on 09/24/2014)    No Known Allergies   ROS:  No fevers, chills, weight changes, headaches, dizziness, chest pain, nausea, vomiting, bowel changes, bleeding/bruising.  Edema and mild DOE as per HPI.  Mild/occasional palpitations.  No depression/anxiety, chest pain or other concerns.   PHYSICAL EXAM: BP 130/72 mmHg  Pulse 68  Ht _0  (1.702 m)  Wt 256 lb (116.121 kg)  BMI 40.09 kg/m2 Well developed, pleasant, obese male in no distress HEENT: PERRL, EOMI, conjunctiva clear.  OP clear.  Neck: no lymphadenopathy, thyromegaly or carotid bruit Heart: Somewhat irregulary irregular, but frequent stretches that sounded regular with an extra beat/ectopy every 3-4th beat. No murmur, rub. Lungs: clear bilaterally Abdomen: obese, soft, nontender Extremities: trace pretibial edema Neuro: alert and oriented. Normal strength, gait, cranial nerves Psych: normal mood, affect, hygiene and grooming  Check EKG today since cardiology appointment is NOT at 6 weeks like recommended.   EKG: NSR. Read as sinus arrhythmia, but it doesn't appear to be.  NOT in flutter.  PAC's noted.  ASSESSMENT/PLAN:  Medication monitoring encounter - Plan: Basic metabolic panel  Obstructive sleep apnea - Rx CPAP supplies as requested  Atrial flutter, unspecified - with h/o recent CVA.  Not currently in flutter.  Due now for cardiology f/u, but not scheduled until April--send copies of labs/EKG - Plan: EKG 12-Lead  Colon cancer screening   Colon cancer screening--due for 5 year f/u based on family history.  He prefers not to have this at this time (and is on anti-coagulant). Discussed Cologard.  Referral made--he is not yet on Medicare (just part A), so may not be covered. If not covered, recommend consult with GI to discuss risks/benefits and necessity of repeat colonoscopy given the change in his health, and  being on blood thinners.  Check b-met today (past due, recommended by Dr. Doretha Imus copy of labs to Dr. Stanford Breed)  Rx for CPAP supplies

## 2014-11-01 LAB — BASIC METABOLIC PANEL
BUN: 15 mg/dL (ref 6–23)
CHLORIDE: 103 meq/L (ref 96–112)
CO2: 30 meq/L (ref 19–32)
Calcium: 9 mg/dL (ref 8.4–10.5)
Creat: 0.87 mg/dL (ref 0.50–1.35)
Glucose, Bld: 109 mg/dL — ABNORMAL HIGH (ref 70–99)
Potassium: 4.2 mEq/L (ref 3.5–5.3)
SODIUM: 141 meq/L (ref 135–145)

## 2014-11-02 ENCOUNTER — Encounter: Payer: Self-pay | Admitting: *Deleted

## 2014-11-18 ENCOUNTER — Encounter: Payer: Self-pay | Admitting: Family Medicine

## 2014-11-19 ENCOUNTER — Other Ambulatory Visit: Payer: Self-pay | Admitting: Family Medicine

## 2014-11-24 ENCOUNTER — Telehealth: Payer: Self-pay | Admitting: Family Medicine

## 2014-11-24 NOTE — Telephone Encounter (Signed)
Leanne with [employer] called and stated cologuard showed a positive result.  She is faxing over, but also wanted to give a verbal

## 2014-11-24 NOTE — Telephone Encounter (Signed)
Advise pt of abnomal result.  It is recommended that he follow up with Dr. Penelope Coop (whose office has contacted him and told him he was due for another colonoscopy).  Fax results to Dr Penelope Coop once available

## 2014-11-28 NOTE — Telephone Encounter (Signed)
Patient advised, copy faxed to Dr.Ganem. Pt will call and schedule follow up with Dr.Ganem.

## 2014-11-29 ENCOUNTER — Encounter: Payer: Self-pay | Admitting: Family Medicine

## 2014-12-14 NOTE — Progress Notes (Signed)
HPI: FU coronary artery disease, hypertension, and hyperlipidemia. Echo 7/15 showed normal LV function, grade 2 diastolic dysfunction and mild LAE. Cath with severe CAD and EF 50. He underwent bypass grafting x2 with LIMA to the LAD and vein graft to the first obtuse marginal branch of the left circumflex. Abd ultrasound 9/15 showed no aneurysm. Carotid dopplers 1/16 showed 40-59 right and 1-39 left. Echo 1/16 showed EF 40-45, severe LAE, moderate to severe RAE, mild RVE, moderate reduction in RV function and mild MR. Admitted 1/16 with a flutter and new CVA. Treated with apixaban. When I last saw him he was noted to be in atrial flutter. We are treating with rate control and anticoagulation with plans for cardioversion. Since last seen, he has some dyspnea on exertion but no orthopnea, PND or chest pain. Minimal pedal edema.  Current Outpatient Prescriptions  Medication Sig Dispense Refill  . acetaminophen (TYLENOL) 500 MG tablet Take 1,000 mg by mouth at bedtime as needed for mild pain.     Marland Kitchen apixaban (ELIQUIS) 5 MG TABS tablet Take 1 tablet (5 mg total) by mouth 2 (two) times daily. 60 tablet 2  . atorvastatin (LIPITOR) 80 MG tablet Take 80 mg by mouth every evening.    Marland Kitchen CALCIUM-MAGNESIUM-ZINC PO Take 1 each by mouth daily.    . Cholecalciferol (VITAMIN D) 2000 UNITS tablet Take 4,000 Units by mouth daily.     . Coenzyme Q10 (CO Q-10) 100 MG CAPS Take 1 capsule by mouth daily.    Marland Kitchen FLUOROPLEX 1 % cream Apply 1 application topically 2 (two) times daily.     . fluticasone (FLONASE) 50 MCG/ACT nasal spray USE 2 SPRAYS NASALLY DAILY 48 g 1  . furosemide (LASIX) 20 MG tablet Take 1 tablet (20 mg total) by mouth daily. 90 tablet 3  . loratadine (CLARITIN) 10 MG tablet Take 10 mg by mouth daily.     . metoprolol tartrate (LOPRESSOR) 25 MG tablet Take 1 tablet (25 mg total) by mouth 2 (two) times daily. 60 tablet 3  . NITROSTAT 0.4 MG SL tablet Place 0.4 mg under the tongue every 5 (five)  minutes as needed for chest pain.     Marland Kitchen omeprazole (PRILOSEC) 20 MG capsule Take 20 mg by mouth every other day.     . potassium chloride (K-DUR) 10 MEQ tablet Take 1 tablet (10 mEq total) by mouth daily. 90 tablet 3  . STUDY MEDICATION Take 1 tablet by mouth every evening. [employer] [facility] for cholesterol. Study medication Anacetrapib or placebo    . testosterone cypionate (DEPOTESTOTERONE CYPIONATE) 200 MG/ML injection Inject 200 mg into the muscle every 14 (fourteen) days.      No current facility-administered medications for this visit.     Past Medical History  Diagnosis Date  . CAD (coronary artery disease)   . HTN (hypertension)   . Prostatitis   . Hyperlipidemia   . Obesity   . Diverticular disease   . Gallstones   . Hepatitis age 55  . Arthritis   . Sleep apnea     mild, CPAP wasn't indicated (per pt)--2014 mod-severe, CPAP rec  . Benign prostatic hypertrophy     followed by Dr. Gaynelle Arabian  . Cerebrovascular disease   . Sepsis due to Escherichia coli ? 2011    DUE TO URINARY OUTLET OBSTRUCTION /INFECTION  . UTI (urinary tract infection)     secondary  . Allergy     RHINITIS  . Myocardial infarction '98, '  09    (Dr. Stanford Breed); s/p stents  . GERD (gastroesophageal reflux disease)   . Hypogonadism male     treated by Dr. Gaynelle Arabian  . Erectile dysfunction   . Peripheral vascular disease     02/28/12 MOST RECENT CAROTID DUPLEX--"STABLE MILD CALCIFIED PLAQUE, BILATERALY, STABLE, OVER SERIAL EXAMS"  . 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 IFG (impaired fasting glucose)   . Pneumonia     hx of  . 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 Diverticulosis     seen on colonoscopy (9/05, 06/2009)  . Hyperplastic colon polyp 05/2004    Past Surgical History  Procedure Laterality Date  . Total knee arthroplasty  L 4/06, R 4/07  . Vasectomy    . Tonsillectomy    . Transurethral resection of prostate  06/2010  . Knee  surgery  05/2010    removing scar tissue R knee (Dr. Wynelle Link)  . Coronary angioplasty with stent placement  01/1997    PTCA/stent to RCA  AND 2009 2ND STENT PLACED  . Joint replacement      both knees  . Total knee revision  09/23/2012    Procedure: TOTAL KNEE REVISION;  Surgeon: Gearlean Alf, MD;  Location: [facility];  Service: Orthopedics;  Laterality: Right;  . Coronary artery bypass graft N/A 04/15/2014    Procedure: CORONARY ARTERY BYPASS GRAFTING TIMES TWO USEING LEFT INTERNAL MAMMARY ARTERY AND RIGHT GREATER SAPHENOUS VEIN VIA ENDOVEIN HARVEST;  Surgeon: Rexene Alberts, MD;  Location: [place];  Service: Open Heart Surgery;  Laterality: N/A;  . Intraoperative transesophageal echocardiogram N/A 04/15/2014    Procedure: INTRAOPERATIVE TRANSESOPHAGEAL ECHOCARDIOGRAM;  Surgeon: Rexene Alberts, MD;  Location: [place];  Service: Open Heart Surgery;  Laterality: N/A;  . Left and right heart catheterization with coronary angiogram  04/13/2014    Procedure: LEFT AND RIGHT HEART CATHETERIZATION WITH CORONARY ANGIOGRAM;  Surgeon: Sinclair Grooms, MD;  Location: [facility];  Service: Cardiovascular;;    History   Social History  . Marital Status: Married    Spouse Name: N/A  . Number of Children: 4  . Years of Education: N/A   Occupational History  . CPA    Social History Main Topics  . Smoking status: Former Smoker -- 1.50 packs/day for 30 years    Types: Cigarettes    Quit date: 09/16/1986  . Smokeless tobacco: Never Used     Comment: 35 pack year history, none in 18 years  . Alcohol Use: 0.0 oz/week    0 Standard drinks or equivalent per week     Comment: 1 glass of wine twice a week (previously daily)  . Drug Use: No  . Sexual Activity: Not Currently   Other Topics Concern  . Not on file   Social History Narrative   Lives with wife.  4 kids, 1 stepchild (1 in [place], 4 in [place]). No pets. Wife is a diabetic    ROS: no fevers or chills, productive cough, hemoptysis, dysphasia,  odynophagia, melena, hematochezia, dysuria, hematuria, rash, seizure activity, orthopnea, PND, pedal edema, claudication. Remaining systems are negative.  Physical Exam: Well-developed well-nourished in no acute distress.  Skin is warm and dry.  HEENT is normal.  Neck is supple.  Chest is clear to auscultation with normal expansion.  Cardiovascular exam is regular rate and rhythm.  Abdominal exam nontender or distended.  No masses palpated. Extremities show 1+ ankle edema. neuro grossly intact  ECG sinus rhythm with PACs. Right axis deviation. Nonspecific T-wave changes.

## 2014-12-19 ENCOUNTER — Encounter: Payer: Self-pay | Admitting: Cardiology

## 2014-12-19 ENCOUNTER — Ambulatory Visit (INDEPENDENT_AMBULATORY_CARE_PROVIDER_SITE_OTHER): Payer: 59 | Admitting: Cardiology

## 2014-12-19 VITALS — BP 140/80 | HR 77 | Ht 67.0 in | Wt 262.0 lb

## 2014-12-19 DIAGNOSIS — I2510 Atherosclerotic heart disease of native coronary artery without angina pectoris: Secondary | ICD-10-CM

## 2014-12-19 DIAGNOSIS — I4891 Unspecified atrial fibrillation: Secondary | ICD-10-CM | POA: Diagnosis not present

## 2014-12-19 DIAGNOSIS — I639 Cerebral infarction, unspecified: Secondary | ICD-10-CM

## 2014-12-19 DIAGNOSIS — I4892 Unspecified atrial flutter: Secondary | ICD-10-CM

## 2014-12-19 DIAGNOSIS — I10 Essential (primary) hypertension: Secondary | ICD-10-CM

## 2014-12-19 DIAGNOSIS — R0602 Shortness of breath: Secondary | ICD-10-CM | POA: Diagnosis not present

## 2014-12-19 DIAGNOSIS — E785 Hyperlipidemia, unspecified: Secondary | ICD-10-CM

## 2014-12-19 DIAGNOSIS — I5031 Acute diastolic (congestive) heart failure: Secondary | ICD-10-CM

## 2014-12-19 LAB — BASIC METABOLIC PANEL WITH GFR
BUN: 12 mg/dL (ref 6–23)
CALCIUM: 9.3 mg/dL (ref 8.4–10.5)
CO2: 29 meq/L (ref 19–32)
Chloride: 102 mEq/L (ref 96–112)
Creat: 0.96 mg/dL (ref 0.50–1.35)
GFR, Est Non African American: 78 mL/min
Glucose, Bld: 83 mg/dL (ref 70–99)
POTASSIUM: 5.3 meq/L (ref 3.5–5.3)
SODIUM: 141 meq/L (ref 135–145)

## 2014-12-19 LAB — CBC
HEMATOCRIT: 42.2 % (ref 39.0–52.0)
HEMOGLOBIN: 12.3 g/dL — AB (ref 13.0–17.0)
MCH: 20.6 pg — AB (ref 26.0–34.0)
MCHC: 29.1 g/dL — ABNORMAL LOW (ref 30.0–36.0)
MCV: 70.7 fL — ABNORMAL LOW (ref 78.0–100.0)
Platelets: 281 10*3/uL (ref 150–400)
RBC: 5.97 MIL/uL — ABNORMAL HIGH (ref 4.22–5.81)
RDW: 19 % — ABNORMAL HIGH (ref 11.5–15.5)
WBC: 8.2 10*3/uL (ref 4.0–10.5)

## 2014-12-19 MED ORDER — APIXABAN 5 MG PO TABS
5.0000 mg | ORAL_TABLET | Freq: Two times a day (BID) | ORAL | Status: DC
Start: 2014-12-19 — End: 2014-12-19

## 2014-12-19 MED ORDER — APIXABAN 5 MG PO TABS: 5.0000 mg | ORAL_TABLET | Freq: Two times a day (BID) | ORAL | Status: DC

## 2014-12-19 NOTE — Assessment & Plan Note (Signed)
Continue statin. 

## 2014-12-19 NOTE — Assessment & Plan Note (Signed)
Continue present dose of Lasix. Check BNP.if elevated I will advance Lasix.

## 2014-12-19 NOTE — Assessment & Plan Note (Signed)
Continue statin. Not on aspirin given need for anticoagulation. 

## 2014-12-19 NOTE — Addendum Note (Signed)
Addended by: Cristopher Estimable on: 12/19/2014 03:49 PM   Modules accepted: Orders

## 2014-12-19 NOTE — Assessment & Plan Note (Signed)
Patient back in sinus rhythm. Continue beta blocker. Continue apixaban. Given history of TIA I would not be comfortable discontinuing anticoagulation. I will therefore not refer for atrial flutter ablation. Check hemoglobin and renal function.

## 2014-12-19 NOTE — Addendum Note (Signed)
Addended by: Cristopher Estimable on: 12/19/2014 10:02 AM   Modules accepted: Orders

## 2014-12-19 NOTE — Assessment & Plan Note (Signed)
Blood pressure controlled. Continue present medications. 

## 2014-12-19 NOTE — Patient Instructions (Signed)
Your physician wants you to follow-up in: 6 MONTHS WITH DR CRENSHAW You will receive a reminder letter in the mail two months in advance. If you don't receive a letter, please call our office to schedule the follow-up appointment.   Your physician recommends that you HAVE LAB WORK TODAY 

## 2014-12-19 NOTE — Assessment & Plan Note (Signed)
Follow-up carotid Dopplers January 2017.

## 2014-12-20 LAB — BRAIN NATRIURETIC PEPTIDE: Brain Natriuretic Peptide: 215.6 pg/mL — ABNORMAL HIGH (ref 0.0–100.0)

## 2014-12-22 ENCOUNTER — Encounter: Payer: Self-pay | Admitting: Cardiology

## 2014-12-22 ENCOUNTER — Other Ambulatory Visit: Payer: Self-pay | Admitting: *Deleted

## 2014-12-22 DIAGNOSIS — I4891 Unspecified atrial fibrillation: Secondary | ICD-10-CM

## 2014-12-22 MED ORDER — FUROSEMIDE 20 MG PO TABS: 40.0000 mg | ORAL_TABLET | Freq: Every day | ORAL | Status: DC

## 2014-12-22 NOTE — Telephone Encounter (Signed)
This encounter was created in error - please disregard.

## 2014-12-22 NOTE — Telephone Encounter (Signed)
Pt is returning Jeffrey Wiggins's call about some blood test results. Please call back  Thanks

## 2014-12-26 ENCOUNTER — Other Ambulatory Visit: Payer: Self-pay | Admitting: *Deleted

## 2014-12-26 ENCOUNTER — Telehealth: Payer: Self-pay | Admitting: *Deleted

## 2014-12-26 DIAGNOSIS — R718 Other abnormality of red blood cells: Secondary | ICD-10-CM

## 2014-12-26 NOTE — Telephone Encounter (Signed)
Labs were forwarded to [place] due to microcytosis (small red cells). This can be a sign of iron deficiency. They did not check iron or iron stores. We know he had a + cologard, and was supposed to follow up with Dr. Penelope Coop. Please ensure that he is scheduled. Also recommend iron and ferritin levels be drawn, dx microcytosis   Spoke with patient and is will come in tomorrow, 12-27-14 for iron and ferritin level. I got him an appt with Dr.Gamen for 01/06/15 @ 2:00pm. Patient is aware and faxed all pertinent records to [phone].

## 2014-12-27 ENCOUNTER — Other Ambulatory Visit: Payer: 59

## 2014-12-27 DIAGNOSIS — R718 Other abnormality of red blood cells: Secondary | ICD-10-CM

## 2014-12-27 LAB — IRON: IRON: 26 ug/dL — AB (ref 42–165)

## 2014-12-27 LAB — FERRITIN: Ferritin: 8 ng/mL — ABNORMAL LOW (ref 22–322)

## 2014-12-28 ENCOUNTER — Ambulatory Visit (INDEPENDENT_AMBULATORY_CARE_PROVIDER_SITE_OTHER): Payer: 59 | Admitting: Neurology

## 2014-12-28 ENCOUNTER — Encounter: Payer: Self-pay | Admitting: Neurology

## 2014-12-28 VITALS — BP 122/62 | HR 73 | Resp 16 | Wt 263.8 lb

## 2014-12-28 DIAGNOSIS — I4892 Unspecified atrial flutter: Secondary | ICD-10-CM | POA: Diagnosis not present

## 2014-12-28 DIAGNOSIS — I639 Cerebral infarction, unspecified: Secondary | ICD-10-CM

## 2014-12-28 LAB — FERRITIN

## 2014-12-28 NOTE — Progress Notes (Signed)
[facility] [address] [place]. [place] [address] [phone]       OFFICE FOLLOW-UP NOTE  [name] Date of Birth:  [birth year] Medical Record Number:  [id number]   HPI: 74 year Caucasian male seen today for the first office follow-up visit following hospital admission for stroke. He was admitted on 09/25/14 with lightheadedness associated with headache and right-sided visual field disturbance. He was found to be in new onset atrial flutter fibrillation the emergency room. MRI scan of the brain showed a small left occipital infarct and mild changes of small vessel disease. MRA of the brain showed no significant stenosis and carotid ultrasound showed no extracranial stenosis. Transthoracic echo showed normal ejection fraction. LDL cholesterol was 51 mg percent hemoglobin A1c was 6.0. Urine drug screen was negative. He was started on eliquis for second stroke prevention and states his done well and is 90% better. He has only very minimum right upper vision quadrant of vision difficulties. He feels his memory is fine he works as a Licensed conveyancer and has had no trouble doing his taxes this year. She is on metoprolol for heart rate and states that while exercising last week he may have gone back present into atrial flutter. He does use CPAP regularly every night for sleep apnea. He has been exercising for almost one hour 5 days a week regularly. He has no other complaints today.  ROS:   14 system review of systems is positive for  hearing loss, leg swelling, cough, dizziness and all other systems negative  PMH:  Past Medical History  Diagnosis Date  . CAD (coronary artery disease)   . HTN (hypertension)   . Prostatitis   . Hyperlipidemia   . Obesity   . Diverticular disease   . Gallstones   . Hepatitis age 54  . Arthritis   . Sleep apnea     mild, CPAP wasn't indicated (per pt)--2014 mod-severe, CPAP rec  . Benign prostatic hypertrophy     followed by Dr.  Gaynelle Arabian  . Cerebrovascular disease   . Sepsis due to Escherichia coli ? 2011    DUE TO URINARY OUTLET OBSTRUCTION /INFECTION  . UTI (urinary tract infection)     secondary  . Allergy     RHINITIS  . 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 Stanford Breed); s/p stents  . GERD (gastroesophageal reflux disease)   . Hypogonadism male     treated by Dr. Gaynelle Arabian  . Erectile dysfunction   . Peripheral vascular disease     02/28/12 MOST RECENT CAROTID DUPLEX--"STABLE MILD CALCIFIED PLAQUE, BILATERALY, STABLE, OVER SERIAL EXAMS"  . 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 IFG (impaired fasting glucose)   . Pneumonia     hx of  . 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 Diverticulosis     seen on colonoscopy (9/05, 06/2009)  . Hyperplastic colon polyp 05/2004    Social History:  History   Social History  . Marital Status: Married    Spouse Name: N/A  . Number of Children: 4  . Years of Education: N/A   Occupational History  . CPA    Social History Main Topics  . Smoking status: Former Smoker -- 1.50 packs/day for 30 years    Types: Cigarettes    Quit date: 09/16/1986  . Smokeless tobacco: Never Used     Comment: 35  pack year history, none in 18 years  . Alcohol Use: 0.0 oz/week    0 Standard drinks or equivalent per week     Comment: 1 glass of wine twice a week (previously daily)  . Drug Use: No  . Sexual Activity: Not Currently   Other Topics Concern  . Not on file   Social History Narrative   Lives with wife.  4 kids, 1 stepchild (1 in [place], 4 in [place]). No pets. Wife is a diabetic    Medications:   Current Outpatient Prescriptions on File Prior to Visit  Medication Sig Dispense Refill  . acetaminophen (TYLENOL) 500 MG tablet Take 1,000 mg by mouth at bedtime as needed for mild pain.     Marland Kitchen apixaban (ELIQUIS) 5 MG TABS tablet Take 1 tablet (5 mg total) by mouth 2 (two) times daily. 180 tablet 3  . atorvastatin (LIPITOR) 80 MG  tablet Take 80 mg by mouth every evening.    Marland Kitchen CALCIUM-MAGNESIUM-ZINC PO Take 1 each by mouth daily.    . Cholecalciferol (VITAMIN D) 2000 UNITS tablet Take 4,000 Units by mouth daily.     . Coenzyme Q10 (CO Q-10) 100 MG CAPS Take 1 capsule by mouth daily.    . fluticasone (FLONASE) 50 MCG/ACT nasal spray USE 2 SPRAYS NASALLY DAILY 48 g 1  . furosemide (LASIX) 20 MG tablet Take 2 tablets (40 mg total) by mouth daily. 180 tablet 3  . loratadine (CLARITIN) 10 MG tablet Take 10 mg by mouth daily.     . metoprolol tartrate (LOPRESSOR) 25 MG tablet Take 1 tablet (25 mg total) by mouth 2 (two) times daily. 60 tablet 3  . NITROSTAT 0.4 MG SL tablet Place 0.4 mg under the tongue every 5 (five) minutes as needed for chest pain.     Marland Kitchen omeprazole (PRILOSEC) 20 MG capsule Take 20 mg by mouth every other day.     . potassium chloride (K-DUR) 10 MEQ tablet Take 1 tablet (10 mEq total) by mouth daily. 90 tablet 3  . STUDY MEDICATION Take 1 tablet by mouth every evening. [facility] for cholesterol. Study medication Anacetrapib or placebo    . testosterone cypionate (DEPOTESTOTERONE CYPIONATE) 200 MG/ML injection Inject 200 mg into the muscle every 14 (fourteen) days.      No current facility-administered medications on file prior to visit.    Allergies:  No Known Allergies  Physical Exam General: Obese middle-age Caucasian male, seated, in no evident distress Head: head normocephalic and atraumatic.  Neck: supple with no carotid or supraclavicular bruits Cardiovascular: regular rate and rhythm, no murmurs Musculoskeletal: no deformity Skin:  no rash/petichiae Vascular:  Normal pulses all extremities Filed Vitals:   12/28/14 1517  BP: 122/62  Pulse: 73  Resp: 16   Neurologic Exam Mental Status: Awake and fully alert. Oriented to place and time. Recent and remote memory intact. Attention span, concentration and fund of knowledge appropriate. Mood and affect appropriate.  Cranial Nerves:  Fundoscopic exam reveals sharp disc margins. Pupils equal, briskly reactive to light. Extraocular movements full without nystagmus. Visual fields full to confrontation except partial RU quadrant defect in right eye. Hearing intact. Facial sensation intact. Face, tongue, palate moves normally and symmetrically.  Motor: Normal bulk and tone. Normal strength in all tested extremity muscles. Sensory.: intact to touch ,pinprick .position and vibratory sensation.  Coordination: Rapid alternating movements normal in all extremities. Finger-to-nose and heel-to-shin performed accurately bilaterally. Gait and Station: Arises from chair without difficulty. Stance is  normal. Gait demonstrates normal stride length and balance . Able to heel, toe and tandem walk without difficulty.  Reflexes: 1+ and symmetric. Toes downgoing.   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 Multiple vascular risk factors of hypertension, hyperlipidemia, heart disease, obesity , sleep apnoea and atrial flutter.    PLAN: I had a long d/w patient about his recent stroke, risk for recurrent stroke/TIAs, personally independently reviewed imaging studies and stroke evaluation results and answered questions.Continue Eliquis  for secondary stroke prevention  due to atrial fibrillation and maintain strict control of hypertension with blood pressure goal below 130/90, diabetes with hemoglobin A1c goal below 6.5% and lipids with LDL cholesterol goal below 100 mg/dL. I encouraged him to use CPAP every night for his sleep apnea I also advised the patient to eat a healthy diet with plenty of whole grains, cereals, fruits and vegetables, exercise regularly and maintain ideal body weight Followup in the future with me in 6 months or call earlier if necessary   Note: This document was prepared with digital dictation and possible  smart phrase technology. Any transcriptional errors that result from this process are unintentional

## 2014-12-28 NOTE — Patient Instructions (Signed)
I had a long d/w patient about his recent stroke, risk for recurrent stroke/TIAs, personally independently reviewed imaging studies and stroke evaluation results and answered questions.Continue Eliquis  for secondary stroke prevention  Due to atrial fibrillation and maintain strict control of hypertension with blood pressure goal below 130/90, diabetes with hemoglobin A1c goal below 6.5% and lipids with LDL cholesterol goal below 100 mg/dL. I encouraged him to use CPAP every night for his sleep apnea I also advised the patient to eat a healthy diet with plenty of whole grains, cereals, fruits and vegetables, exercise regularly and maintain ideal body weight Followup in the future with me in 6 months or call earlier if necessary Stroke Prevention Some medical conditions and behaviors are associated with an increased chance of having a stroke. You may prevent a stroke by making healthy choices and managing medical conditions. HOW CAN I REDUCE MY RISK OF HAVING A STROKE?   Stay physically active. Get at least 30 minutes of activity on most or all days.  Do not smoke. It may also be helpful to avoid exposure to secondhand smoke.  Limit alcohol use. Moderate alcohol use is considered to be:  No more than 2 drinks per day for men.  No more than 1 drink per day for nonpregnant women.  Eat healthy foods. This involves:  Eating 5 or more servings of fruits and vegetables a day.  Making dietary changes that address high blood pressure (hypertension), high cholesterol, diabetes, or obesity.  Manage your cholesterol levels.  Making food choices that are high in fiber and low in saturated fat, trans fat, and cholesterol may control cholesterol levels.  Take any prescribed medicines to control cholesterol as directed by your health care provider.  Manage your diabetes.  Controlling your carbohydrate and sugar intake is recommended to manage diabetes.  Take any prescribed medicines to control  diabetes as directed by your health care provider.  Control your hypertension.  Making food choices that are low in salt (sodium), saturated fat, trans fat, and cholesterol is recommended to manage hypertension.  Take any prescribed medicines to control hypertension as directed by your health care provider.  Maintain a healthy weight.  Reducing calorie intake and making food choices that are low in sodium, saturated fat, trans fat, and cholesterol are recommended to manage weight.  Stop drug abuse.  Avoid taking birth control pills.  Talk to your health care provider about the risks of taking birth control pills if you are over 46 years old, smoke, get migraines, or have ever had a blood clot.  Get evaluated for sleep disorders (sleep apnea).  Talk to your health care provider about getting a sleep evaluation if you snore a lot or have excessive sleepiness.  Take medicines only as directed by your health care provider.  For some people, aspirin or blood thinners (anticoagulants) are helpful in reducing the risk of forming abnormal blood clots that can lead to stroke. If you have the irregular heart rhythm of atrial fibrillation, you should be on a blood thinner unless there is a good reason you cannot take them.  Understand all your medicine instructions.  Make sure that other conditions (such as anemia or atherosclerosis) are addressed. SEEK IMMEDIATE MEDICAL CARE IF:   You have sudden weakness or numbness of the face, arm, or leg, especially on one side of the body.  Your face or eyelid droops to one side.  You have sudden confusion.  You have trouble speaking (aphasia) or understanding.  You  have sudden trouble seeing in one or both eyes.  You have sudden trouble walking.  You have dizziness.  You have a loss of balance or coordination.  You have a sudden, severe headache with no known cause.  You have new chest pain or an irregular heartbeat. Any of these  symptoms may represent a serious problem that is an emergency. Do not wait to see if the symptoms will go away. Get medical help at once. Call your local emergency services (911 in [place] Do not drive yourself to the hospital. Document Released: 10/10/2004 Document Revised: 01/17/2014 Document Reviewed: 03/05/2013 [facility] Patient Information 2015 [place], [place]. This information is not intended to replace advice given to you by your health care provider. Make sure you discuss any questions you have with your health care provider.

## 2015-01-09 ENCOUNTER — Telehealth: Payer: Self-pay | Admitting: *Deleted

## 2015-01-09 NOTE — Telephone Encounter (Signed)
Patient is needing colonoscopy due to positive colorguard test, he needs clearance for the procedure and to hold his eliquis prior to the procedure Will forward for dr Stanford Breed review

## 2015-01-09 NOTE — Telephone Encounter (Signed)
DC apixaban 2 days prior to procedure and resume day after if ok with GI. Kirk Ruths

## 2015-01-09 NOTE — Telephone Encounter (Signed)
will forward this note to [facility]

## 2015-01-15 DIAGNOSIS — D126 Benign neoplasm of colon, unspecified: Secondary | ICD-10-CM

## 2015-01-15 HISTORY — DX: Benign neoplasm of colon, unspecified: D12.6

## 2015-02-01 ENCOUNTER — Other Ambulatory Visit: Payer: Self-pay | Admitting: Gastroenterology

## 2015-02-01 LAB — HM COLONOSCOPY

## 2015-02-22 ENCOUNTER — Ambulatory Visit (INDEPENDENT_AMBULATORY_CARE_PROVIDER_SITE_OTHER): Payer: 59 | Admitting: Family Medicine

## 2015-02-22 ENCOUNTER — Encounter: Payer: Self-pay | Admitting: Family Medicine

## 2015-02-22 VITALS — BP 140/84 | HR 72 | Ht 68.0 in | Wt 262.0 lb

## 2015-02-22 DIAGNOSIS — I10 Essential (primary) hypertension: Secondary | ICD-10-CM

## 2015-02-22 DIAGNOSIS — E1159 Type 2 diabetes mellitus with other circulatory complications: Secondary | ICD-10-CM | POA: Diagnosis not present

## 2015-02-22 DIAGNOSIS — Z5181 Encounter for therapeutic drug level monitoring: Secondary | ICD-10-CM

## 2015-02-22 DIAGNOSIS — E119 Type 2 diabetes mellitus without complications: Secondary | ICD-10-CM | POA: Diagnosis not present

## 2015-02-22 DIAGNOSIS — I2510 Atherosclerotic heart disease of native coronary artery without angina pectoris: Secondary | ICD-10-CM | POA: Diagnosis not present

## 2015-02-22 DIAGNOSIS — K219 Gastro-esophageal reflux disease without esophagitis: Secondary | ICD-10-CM | POA: Diagnosis not present

## 2015-02-22 DIAGNOSIS — D509 Iron deficiency anemia, unspecified: Secondary | ICD-10-CM | POA: Diagnosis not present

## 2015-02-22 DIAGNOSIS — E7800 Pure hypercholesterolemia, unspecified: Secondary | ICD-10-CM

## 2015-02-22 DIAGNOSIS — I4891 Unspecified atrial fibrillation: Secondary | ICD-10-CM | POA: Diagnosis not present

## 2015-02-22 DIAGNOSIS — G4733 Obstructive sleep apnea (adult) (pediatric): Secondary | ICD-10-CM | POA: Diagnosis not present

## 2015-02-22 DIAGNOSIS — I63012 Cerebral infarction due to thrombosis of left vertebral artery: Secondary | ICD-10-CM | POA: Diagnosis not present

## 2015-02-22 DIAGNOSIS — Z0000 Encounter for general adult medical examination without abnormal findings: Secondary | ICD-10-CM | POA: Diagnosis not present

## 2015-02-22 DIAGNOSIS — IMO0001 Reserved for inherently not codable concepts without codable children: Secondary | ICD-10-CM

## 2015-02-22 DIAGNOSIS — E1151 Type 2 diabetes mellitus with diabetic peripheral angiopathy without gangrene: Secondary | ICD-10-CM

## 2015-02-22 LAB — POCT URINALYSIS DIPSTICK
Bilirubin, UA: NEGATIVE
Glucose, UA: NEGATIVE
Ketones, UA: NEGATIVE
Leukocytes, UA: NEGATIVE
Nitrite, UA: NEGATIVE
Protein, UA: NEGATIVE
RBC UA: NEGATIVE
SPEC GRAV UA: 1.02
Urobilinogen, UA: NEGATIVE
pH, UA: 7

## 2015-02-22 LAB — CBC WITH DIFFERENTIAL/PLATELET
BASOS ABS: 0 10*3/uL (ref 0.0–0.1)
Basophils Relative: 0 % (ref 0–1)
EOS ABS: 0.2 10*3/uL (ref 0.0–0.7)
EOS PCT: 2 % (ref 0–5)
HCT: 43.2 % (ref 39.0–52.0)
Hemoglobin: 12.8 g/dL — ABNORMAL LOW (ref 13.0–17.0)
Lymphocytes Relative: 21 % (ref 12–46)
Lymphs Abs: 1.7 10*3/uL (ref 0.7–4.0)
MCH: 20.6 pg — ABNORMAL LOW (ref 26.0–34.0)
MCHC: 29.6 g/dL — AB (ref 30.0–36.0)
MCV: 69.7 fL — ABNORMAL LOW (ref 78.0–100.0)
MONO ABS: 0.7 10*3/uL (ref 0.1–1.0)
Monocytes Relative: 9 % (ref 3–12)
NEUTROS ABS: 5.6 10*3/uL (ref 1.7–7.7)
Neutrophils Relative %: 68 % (ref 43–77)
PLATELETS: 296 10*3/uL (ref 150–400)
RBC: 6.2 MIL/uL — ABNORMAL HIGH (ref 4.22–5.81)
RDW: 19.8 % — AB (ref 11.5–15.5)
WBC: 8.2 10*3/uL (ref 4.0–10.5)

## 2015-02-22 LAB — COMPREHENSIVE METABOLIC PANEL
ALBUMIN: 4.4 g/dL (ref 3.5–5.2)
ALK PHOS: 46 U/L (ref 39–117)
ALT: 26 U/L (ref 0–53)
AST: 21 U/L (ref 0–37)
BILIRUBIN TOTAL: 1.1 mg/dL (ref 0.2–1.2)
BUN: 11 mg/dL (ref 6–23)
CO2: 29 meq/L (ref 19–32)
CREATININE: 0.96 mg/dL (ref 0.50–1.35)
Calcium: 9.3 mg/dL (ref 8.4–10.5)
Chloride: 100 mEq/L (ref 96–112)
Glucose, Bld: 94 mg/dL (ref 70–99)
POTASSIUM: 4.6 meq/L (ref 3.5–5.3)
SODIUM: 139 meq/L (ref 135–145)
Total Protein: 7.1 g/dL (ref 6.0–8.3)

## 2015-02-22 LAB — TSH: TSH: 2.123 u[IU]/mL (ref 0.350–4.500)

## 2015-02-22 LAB — FERRITIN: FERRITIN: 8 ng/mL — AB (ref 22–322)

## 2015-02-22 LAB — IRON: Iron: 38 ug/dL — ABNORMAL LOW (ref 42–165)

## 2015-02-22 NOTE — Progress Notes (Signed)
Chief Complaint  Patient presents with  . fasting cpe    fasting cpe, med check. having some effects of stroke. no other concerns    Jeffrey Wiggins is a 74 y.o. male who presents for a complete physical.  He has the following concerns:  +Cologard test.  He got clearance from cardiologist to stop Eliquis for colonoscopy, and had colonoscopy with [facility] last month. No records were received, but he reports it was normal. He is worried about his iron levels, would like them rechecked.  Diabetes. He has lost 30 pounds, up and down 5# here and there but overall has maintained this 30# loss. He has been following a diabetic diet.He goes to the fitness center, getting exercise 5x/week. He was started on metformin in the hospital (03/2014), but it was stopped when they never let him participate in rehab (or made him eat so much to get his sugar up, that he was gaining weight). Never on medications prior to this hospitalization, only diagnosed 05/2013 based on A1c of 6.7, and dietary recommendations were made.  Hyperlipidemia: He remains on a study drug, plus 75m of lipitor. He isn't having side effects or problems. Study drug trial ends the end of the year.  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 He is on Eliquis, and last saw [facility] in April 2016.  Every once in a while he has a little dizziness/vertigo, and very occasional, short-lived "shimmering" in his peripheral vision (which occurs only one side at a time, but has occurred on both sides.  He hasn't seen his ophtho in over a year, but plans to.  Atrial fibrillation: he last saw cardiologist in April, at which time he was back in sinus rhythm. It was recommended to continue anticoagulation and beta blocker.  He is aware that he goes out of rhythm periodically, when pulse is being monitored by fitness machines at the gym, he sees wild fluctuations in pulse.  He has no symptoms during  these episodes.  CAD:  S/p CABG; stable.  Not on aspirin due to being on eliquis Diastolic heart failure--on lasix per cardiologist Carotid artery stenosis--dopplers due again 09/2015 OSA: compliant with CPAP use nightly.  GERD--controlled with meds. He takes the prilosec every other day; has no heartburn on the days he doesn't take it, but if he skips 2 days, he has recurrent symptoms. Denies dysphagia. He coughs less since taking prilosec.  He finds he coughs more since being on CPAP, thinks the humidified air may be contributing.  He coughs up some clear mucus. He does have allergies, sneezes occasionally.  Overall allergies are controlled with claritin and flonase.  Hypogonadism--was on testosterone replacement per Dr. TGaynelle Arabianin the past. He had stopped using it a couple of years ago due to concerns he saw on TV re: risks. He subsequently went back on injections (for the last couple of years), and notices improvement--now able to have erections. He sees Dr. TGaynelle Arabian yearly for followup on his prostate. He does not check testicles, just the prostate.  He sees dermatologist regularly, and reports having had a skin cancer removed from the top of his scalp.  Immunization History  Administered Date(s) Administered  . Influenza Split 06/14/2011, 07/07/2012, 06/18/2013  . Influenza, High Dose Seasonal PF 06/14/2014  . Pneumococcal Conjugate-13 08/18/2014  . Pneumococcal Polysaccharide-23 08/20/2007  . Td 08/16/2002  . Tdap 03/16/2012  . Zoster 12/04/2012   Last colonoscopy: recently; after +Cologard test--reportedly normal.  Need to get records from [place] Last PSA: per Dr. Gaynelle Arabian, yearly (due to see him next month)  Dentist: twice yearly  Ophtho: yearly, past due now Exercise:5 times/week, 60 mins of cardio and weights  Has healthcare power of attorney and living will. He is Full Code, Full Care  Past Medical History  Diagnosis Date  . CAD (coronary artery disease)    . HTN (hypertension)   . Prostatitis   . Hyperlipidemia   . Obesity   . Diverticular disease   . Gallstones   . Hepatitis age 69  . Arthritis   . Sleep apnea     mild, CPAP wasn't indicated (per pt)--2014 mod-severe, on CPAP since 07/2013  . Benign prostatic hypertrophy     followed by Dr. Gaynelle Arabian  . Cerebrovascular disease   . Sepsis due to Escherichia coli ? 2011    DUE TO URINARY OUTLET OBSTRUCTION /INFECTION  . UTI (urinary tract infection)     secondary  . Allergy     RHINITIS  . 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 Stanford Breed); s/p stents  . GERD (gastroesophageal reflux disease)   . Hypogonadism male     treated by Dr. Gaynelle Arabian  . Erectile dysfunction   . Peripheral vascular disease     02/28/12 MOST RECENT CAROTID DUPLEX--"STABLE MILD CALCIFIED PLAQUE, BILATERALY, STABLE, OVER SERIAL EXAMS"  . 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 IFG (impaired fasting glucose)   . Pneumonia     hx of  . 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 Diverticulosis     seen on colonoscopy (9/05, 06/2009)  . Hyperplastic colon polyp 05/2004    Past Surgical History  Procedure Laterality Date  . Total knee arthroplasty  L 4/06, R 4/07  . Vasectomy    . Tonsillectomy    . Transurethral resection of prostate  06/2010  . Knee surgery  05/2010    removing scar tissue R knee (Dr. Wynelle Link)  . Coronary angioplasty with stent placement  01/1997    PTCA/stent to RCA  AND 2009 2ND STENT PLACED  . Joint replacement      both knees  . Total knee revision  09/23/2012    Procedure: TOTAL KNEE REVISION;  Surgeon: Gearlean Alf, MD;  Location: [facility];  Service: Orthopedics;  Laterality: Right;  . Coronary artery bypass graft N/A 04/15/2014    Procedure: CORONARY ARTERY BYPASS GRAFTING TIMES TWO USEING LEFT INTERNAL MAMMARY ARTERY AND RIGHT GREATER SAPHENOUS VEIN VIA ENDOVEIN HARVEST;  Surgeon: Rexene Alberts, MD;  Location: [facility];  Service: Open  Heart Surgery;  Laterality: N/A;  . Intraoperative transesophageal echocardiogram N/A 04/15/2014    Procedure: INTRAOPERATIVE TRANSESOPHAGEAL ECHOCARDIOGRAM;  Surgeon: Rexene Alberts, MD;  Location: [place];  Service: Open Heart Surgery;  Laterality: N/A;  . Left and right heart catheterization with coronary angiogram  04/13/2014    Procedure: LEFT AND RIGHT HEART CATHETERIZATION WITH CORONARY ANGIOGRAM;  Surgeon: Sinclair Grooms, MD;  Location: [facility];  Service: Cardiovascular;;    History   Social History  . Marital Status: Married    Spouse Name: N/A  . Number of Children: 4  . Years of Education: N/A   Occupational History  . CPA    Social History Main Topics  . Smoking status: Former Smoker -- 1.50 packs/day for 30 years    Types:  Cigarettes    Quit date: 09/16/1986  . Smokeless tobacco: Never Used     Comment: 35 pack year history, none in 18 years  . Alcohol Use: 0.0 oz/week    0 Standard drinks or equivalent per week     Comment: 1 glass of wine twice a week (previously daily)  . Drug Use: No  . Sexual Activity: Not Currently   Other Topics Concern  . Not on file   Social History Narrative   Lives with wife.  4 kids, 1 stepchild (1 in [place], 4 in [place]). No pets. Wife is a diabetic    Family History  Problem Relation Age of Onset  . Coronary artery disease Mother     deceased  . Dementia Mother   . Atrial fibrillation Mother   . Stroke Mother     related to atrial fib  . Coronary artery disease Father     deceased  . Hypertension Father   . Heart disease Father   . Cancer Paternal Uncle     colon; died in his 64's  . Colon cancer Paternal Uncle   . Hypertension Paternal Grandmother   . Heart disease Paternal Grandmother   . Hypertension Paternal Grandfather   . Heart disease Paternal Grandfather   . Diabetes Neg Hx     Outpatient Encounter Prescriptions as of 02/22/2015  Medication Sig  . acetaminophen (TYLENOL) 500 MG tablet Take 1,000 mg by mouth at  bedtime as needed for mild pain.   Marland Kitchen apixaban (ELIQUIS) 5 MG TABS tablet Take 1 tablet (5 mg total) by mouth 2 (two) times daily.  Marland Kitchen atorvastatin (LIPITOR) 80 MG tablet Take 80 mg by mouth every evening.  Marland Kitchen CALCIUM-MAGNESIUM-ZINC PO Take 1 each by mouth daily.  . Cholecalciferol (VITAMIN D) 2000 UNITS tablet Take 4,000 Units by mouth daily.   . Coenzyme Q10 (CO Q-10) 100 MG CAPS Take 1 capsule by mouth daily.  . fluticasone (FLONASE) 50 MCG/ACT nasal spray USE 2 SPRAYS NASALLY DAILY  . furosemide (LASIX) 20 MG tablet Take 2 tablets (40 mg total) by mouth daily.  Marland Kitchen loratadine (CLARITIN) 10 MG tablet Take 10 mg by mouth daily.   . metoprolol tartrate (LOPRESSOR) 25 MG tablet Take 1 tablet (25 mg total) by mouth 2 (two) times daily.  . Multiple Vitamin (MULTIVITAMIN) tablet Take 1 tablet by mouth daily. Centrum Silver  . omeprazole (PRILOSEC) 20 MG capsule Take 20 mg by mouth every other day.   . potassium chloride (K-DUR) 10 MEQ tablet Take 1 tablet (10 mEq total) by mouth daily.  . STUDY MEDICATION Take 1 tablet by mouth every evening. [facility] for cholesterol. Study medication Anacetrapib or placebo  . testosterone cypionate (DEPOTESTOTERONE CYPIONATE) 200 MG/ML injection Inject 200 mg into the muscle every 14 (fourteen) days.   Marland Kitchen NITROSTAT 0.4 MG SL tablet Place 0.4 mg under the tongue every 5 (five) minutes as needed for chest pain.    No facility-administered encounter medications on file as of 02/22/2015.    No Known Allergies  ROS: The patient denies anorexia, fever, headaches, vision loss (see HPI re: "shimmers"), ear pain, hoarseness, chest pain, palpitations, syncope, cough (some slight cough from PND); denies nausea, vomiting, diarrhea, constipation, abdominal pain, melena, hematochezia, hematuria, incontinence, dysuria, genital lesions, numbness, tingling, weakness, tremor, suspicious skin lesions, depression, anxiety, abnormal bleeding/bruising, or enlarged lymph nodes. Some  intermittent residual dizziness since stroke. Sometimes his feet/ankles will swell. +hearing loss, wears hearing aides.  Up twice a night  to void, depending on fluid intake.  Patient also has ED, improved since being on testosterone replacement. some right knee pain if he sits for too long. Some mild back pain the day after doing weights at gym. Had massage yesterday which was helpful.   PHYSICAL EXAM:  BP 140/84 mmHg  Pulse 72  Ht 5' 8"  (1.727 m)  Wt 262 lb (118.842 kg)  BMI 39.85 kg/m2   General Appearance:  Alert, cooperative, no distress, appears stated age, obese   Head:  Normocephalic, without obvious abnormality, atraumatic   Eyes:  PERRL, conjunctiva/corneas clear, EOM's intact, fundi  benign   Ears:  TM's and EAC's normal bilaterally. +hearing aides bilaterally   Nose:  Nares normal, mucosa mildly edematous, no erythema or purulence, no drainage or sinus tenderness   Throat:  Lips, mucosa, and tongue normal; teeth and gums normal   Neck:  Supple, no lymphadenopathy; thyroid: no enlargement/tenderness/nodules; no carotid  bruit or JVD   Back:  Spine nontender, no curvature, ROM normal, no CVA tenderness   Lungs:  Clear to auscultation bilaterally without wheezes, rales or ronchi; respirations unlabored   Chest Wall:  No tenderness or deformity   Heart:  Rate is normal, somewhat irregular--hard to say if irregularly irregular, or just frequent ectopic beats, irregularly.  Slight murmur, no rub or gallop.   Breast Exam:  No chest wall tenderness, or masses   Abdomen:  Soft, non-tender, nondistended, normoactive bowel sounds,  no masses, no hepatosplenomegaly. +ventral hernia, nontender, reducible  Genitalia:  Testicles descended bilaterally without masses. No hernias, no lesions  Rectal:  Deferred to urologist.   Extremities:  No clubbing or cyanosis, 1+ pretibial edema bilaterally  Pulses:  2+ and symmetric all extremities   Skin:   Skin turgor normal, no rashes or lesions; hyperpigmentation present R>L consistent with venous stasis changes.  Healed scar on scalp  Lymph nodes:  Cervical, supraclavicular, and axillary nodes normal   Neurologic:  CNII-XII intact, normal strength, sensation and gait; reflexes 2+ and symmetric throughout    Psych: Normal mood, affect, hygiene and grooming.        ASSESSMENT/PLAN:   c-met, lipid, A1c, TSH, CBC,urine microalb, iron, ferritin  PSA screening and prostate exams are done by his urologist; recommended at least 30 minutes of aerobic activity at least 5 days/week; proper sunscreen use reviewed; healthy diet and alcohol recommendations (less than or equal to 2 drinks/day) reviewed; regular seatbelt use; changing batteries in smoke detectors. Self-testicular exams. Immunization recommendations discussed--UTD. Colonoscopy recommendations reviewed--UTD.  Need to get records from [facility]  Obesity--discussed portion sizes, eating smaller meals frequently, and the need for weight loss.  F/u 6 months, sooner prn.

## 2015-02-22 NOTE — Patient Instructions (Signed)
  HEALTH MAINTENANCE RECOMMENDATIONS:  It is recommended that you get at least 30 minutes of aerobic exercise at least 5 days/week (for weight loss, you may need as much as 60-90 minutes). This can be any activity that gets your heart rate up. This can be divided in 10-15 minute intervals if needed, but try and build up your endurance at least once a week.  Weight bearing exercise is also recommended twice weekly.  Eat a healthy diet with lots of vegetables, fruits and fiber.  "Colorful" foods have a lot of vitamins (ie green vegetables, tomatoes, red peppers, etc).  Limit sweet tea, regular sodas and alcoholic beverages, all of which has a lot of calories and sugar.  Up to 2 alcoholic drinks daily may be beneficial for men (unless trying to lose weight, watch sugars).  Drink a lot of water.  Sunscreen of at least SPF 30 should be used on all sun-exposed parts of the skin when outside between the hours of 10 am and 4 pm (not just when at beach or pool, but even with exercise, golf, tennis, and yard work!)  Use a sunscreen that says "broad spectrum" so it covers both UVA and UVB rays, and make sure to reapply every 1-2 hours.  Remember to change the batteries in your smoke detectors when changing your clock times in the spring and fall.  Use your seat belt every time you are in a car, and please drive safely and not be distracted with cell phones and texting while driving.  Please continue to try and cut back on portions and lose additional weight. Continue your current medications. Consider using mucinex if significant cough from postnasal drainage.

## 2015-02-23 LAB — HEMOGLOBIN A1C
Hgb A1c MFr Bld: 6 % — ABNORMAL HIGH (ref <5.7)
MEAN PLASMA GLUCOSE: 126 mg/dL — AB (ref <117)

## 2015-02-23 LAB — MICROALBUMIN / CREATININE URINE RATIO
Creatinine, Urine: 33.9 mg/dL
MICROALB/CREAT RATIO: 8.8 mg/g (ref 0.0–30.0)
Microalb, Ur: 0.3 mg/dL (ref <2.0)

## 2015-03-06 ENCOUNTER — Telehealth: Payer: Self-pay | Admitting: Internal Medicine

## 2015-03-06 NOTE — Telephone Encounter (Signed)
Received medical records from [facility]

## 2015-03-09 ENCOUNTER — Encounter: Payer: Self-pay | Admitting: *Deleted

## 2015-03-17 ENCOUNTER — Telehealth: Payer: Self-pay | Admitting: Cardiology

## 2015-03-17 ENCOUNTER — Other Ambulatory Visit: Payer: Self-pay

## 2015-03-17 MED ORDER — FUROSEMIDE 40 MG PO TABS
40.0000 mg | ORAL_TABLET | Freq: Every day | ORAL | Status: DC
Start: 2015-03-17 — End: 2015-08-08

## 2015-03-17 NOTE — Telephone Encounter (Signed)
°  1. Which medications need to be refilled? Furosemide-need a new prescription for 40 mg or 2 20 mg a day his dosage was increased-need this asap 2. Which pharmacy is medication to be sent to? [phone]  3. Do they need a 30 day or 90 day supply? 90 and refills  4. Would they like a call back once the medication has been sent to the pharmacy? yes

## 2015-03-17 NOTE — Telephone Encounter (Signed)
Done. Patient notified.

## 2015-03-31 ENCOUNTER — Other Ambulatory Visit: Payer: Self-pay | Admitting: Cardiology

## 2015-03-31 DIAGNOSIS — I6529 Occlusion and stenosis of unspecified carotid artery: Secondary | ICD-10-CM

## 2015-04-17 ENCOUNTER — Ambulatory Visit: Payer: 59 | Admitting: Thoracic Surgery (Cardiothoracic Vascular Surgery)

## 2015-04-18 ENCOUNTER — Ambulatory Visit (HOSPITAL_COMMUNITY)
Admission: RE | Admit: 2015-04-18 | Discharge: 2015-04-18 | Disposition: A | Discharge status: Home / Self Care | Payer: 59 | Source: Ambulatory Visit | Attending: Cardiovascular Disease | Admitting: Cardiovascular Disease

## 2015-04-18 DIAGNOSIS — I6529 Occlusion and stenosis of unspecified carotid artery: Secondary | ICD-10-CM | POA: Diagnosis not present

## 2015-04-18 DIAGNOSIS — I6523 Occlusion and stenosis of bilateral carotid arteries: Secondary | ICD-10-CM | POA: Diagnosis not present

## 2015-04-24 ENCOUNTER — Encounter: Payer: 59 | Admitting: Thoracic Surgery (Cardiothoracic Vascular Surgery)

## 2015-04-24 ENCOUNTER — Encounter: Payer: Self-pay | Admitting: Thoracic Surgery (Cardiothoracic Vascular Surgery)

## 2015-04-24 NOTE — Progress Notes (Signed)
This encounter was created in error - please disregard.  This encounter was created in error - please disregard.

## 2015-05-15 ENCOUNTER — Ambulatory Visit (INDEPENDENT_AMBULATORY_CARE_PROVIDER_SITE_OTHER): Payer: 59 | Admitting: Thoracic Surgery (Cardiothoracic Vascular Surgery)

## 2015-05-15 ENCOUNTER — Encounter: Payer: Self-pay | Admitting: Thoracic Surgery (Cardiothoracic Vascular Surgery)

## 2015-05-15 VITALS — BP 127/74 | HR 75 | Resp 20 | Ht 68.0 in | Wt 274.0 lb

## 2015-05-15 DIAGNOSIS — Z951 Presence of aortocoronary bypass graft: Secondary | ICD-10-CM

## 2015-05-15 NOTE — Progress Notes (Signed)
[address]       [place] [address]             [phone]     CARDIOTHORACIC SURGERY OFFICE NOTE  Referring Provider is Jeffrey Breed Denice Bors, MD PCP is Jeffrey Ports, MD   HPI:  Patient returns for routine follow-up more than one year status post coronary artery bypass grafting 2 for left main disease with progressive symptoms of exertional shortness of breath. His postoperative recovery was uncomplicated and he was last seen here in our office on 07/18/2014 at which time he was doing well. In January of this year the patient suffered a very minor stroke and was found to be in atrial flutter. Transthoracic echocardiogram demonstrated mild-to-moderate left ventricular systolic dysfunction with ejection fraction estimated 40-45%.  There was mild mitral regurgitation, severe left atrial enlargement, moderate to severe right atrial enlargement, and moderate right ventricular systolic dysfunction.  He has been treated using long-term anticoagulation with Eliquis and followed intermittently by Dr. Stanford Breed saw him most recently on 12/19/2014. The patient returns to our office for routine follow-up today. He states that he has been doing very well and he has not experienced any new significant problems. He states that he gets short of breath with strenuous physical activity but this does not limit his activities to any significant degree.  Overall his breathing is much improved in comparison with how he felt prior to his heart surgery last summer.   He completed the cardiac rehabilitation program and he continues to exercise on a regular basis, primarily using a stationary bicycle. He has not been very successful at losing weight. He denies any history of chest pain or chest tightness either with activity or at rest.   Current Outpatient Prescriptions  Medication Sig Dispense Refill  . acetaminophen (TYLENOL) 500 MG tablet Take 1,000 mg by mouth at bedtime as needed for mild  pain.     Marland Kitchen apixaban (ELIQUIS) 5 MG TABS tablet Take 1 tablet (5 mg total) by mouth 2 (two) times daily. 180 tablet 3  . atorvastatin (LIPITOR) 80 MG tablet Take 80 mg by mouth every evening.    Marland Kitchen CALCIUM-MAGNESIUM-ZINC PO Take 1 each by mouth daily.    . Cholecalciferol (VITAMIN D) 2000 UNITS tablet Take 4,000 Units by mouth daily.     . Coenzyme Q10 (CO Q-10) 100 MG CAPS Take 1 capsule by mouth daily.    . fluticasone (FLONASE) 50 MCG/ACT nasal spray USE 2 SPRAYS NASALLY DAILY 48 g 1  . furosemide (LASIX) 40 MG tablet Take 1 tablet (40 mg total) by mouth daily. 90 tablet 3  . loratadine (CLARITIN) 10 MG tablet Take 10 mg by mouth daily.     . metoprolol tartrate (LOPRESSOR) 25 MG tablet Take 1 tablet (25 mg total) by mouth 2 (two) times daily. 60 tablet 3  . Multiple Vitamin (MULTIVITAMIN) tablet Take 1 tablet by mouth daily. Centrum Silver    . NITROSTAT 0.4 MG SL tablet Place 0.4 mg under the tongue every 5 (five) minutes as needed for chest pain.     Marland Kitchen omeprazole (PRILOSEC) 20 MG capsule Take 20 mg by mouth every other day.     . potassium chloride (K-DUR) 10 MEQ tablet Take 1 tablet (10 mEq total) by mouth daily. 90 tablet 3  . STUDY MEDICATION Take 1 tablet by mouth every evening. [facility] for cholesterol. Study medication Anacetrapib or placebo    . testosterone cypionate (DEPOTESTOTERONE  CYPIONATE) 200 MG/ML injection Inject 200 mg into the muscle every 14 (fourteen) days.      No current facility-administered medications for this visit.      Physical Exam:   BP 127/74 mmHg  Pulse 75  Resp 20  Ht 5\' 8"  (1.727 m)  Wt 274 lb (124.286 kg)  BMI 41.67 kg/m2  SpO2 97%  General:  Obese but well appearing  Chest:   Clear to auscultation  CV:   Irregular rate and rhythm  Incisions:  Completely healed, sternum is stable  Abdomen:  Soft and nontender  Extremities:  Warm and well-perfused with moderate lower extremity edema  Diagnostic Tests:  Transthoracic  Echocardiography  Patient:  Jeffrey Wiggins, Jeffrey Wiggins MR #:    [id number] Study Date: 09/25/2014 Gender:   M Age:    74 Height:   170.2 cm Weight:   118.4 kg BSA:    2.42 m^2 Pt. Status: Room:    4N26C  SONOGRAPHER Jeffrey Wiggins ADMITTING  Jeffrey Wiggins ATTENDING  Jeffrey Wiggins ORDERING   Jeffrey Wiggins PERFORMING  [facility], Inpatient  cc:  ------------------------------------------------------------------- LV EF: 40% -  45%  ------------------------------------------------------------------- Indications:   CVA 46.  ------------------------------------------------------------------- History:  PMH: Severe obesity. Hepatitis. Bradycardia. Chest pain. Syncope. Atrial flutter. Coronary artery disease. Congestive heart failure. PMH:  Myocardial infarction. Risk factors: Hypertension. Diabetes mellitus. Dyslipidemia.  ------------------------------------------------------------------- Study Conclusions  - Left ventricle: The cavity size was normal. Wall thickness was normal. Systolic function was mildly to moderately reduced. The estimated ejection fraction was in the range of 40% to 45%. Diffuse hypokinesis. The study is not technically sufficient to allow evaluation of LV diastolic function. - Aortic valve: Right coronary cusp mobility was severely restricted. - Mitral valve: There was mild regurgitation. - Left atrium: The atrium was severely dilated. - Right ventricle: The cavity size was mildly dilated. Systolic function was moderately reduced. - Right atrium: The atrium was moderately to severely dilated. - Atrial septum: No defect or patent foramen ovale was identified.  ------------------------------------------------------------------- Labs, prior tests, procedures, and surgery: Coronary artery bypass grafting.  Transthoracic echocardiography. M-mode, complete 2D, spectral Doppler, and color Doppler.  Birthdate: Patient birthdate: [birth year]. Age: Patient is 74 yr old. Sex: Gender: male. BMI: 40.9 kg/m^2. Blood pressure:   115/56 Patient status: Inpatient. Study date: Study date: 09/25/2014. Study time: 02:34 PM. Location: Bedside.  -------------------------------------------------------------------  ------------------------------------------------------------------- Left ventricle: The cavity size was normal. Wall thickness was normal. Systolic function was mildly to moderately reduced. The estimated ejection fraction was in the range of 40% to 45%. Diffuse hypokinesis. The study is not technically sufficient to allow evaluation of LV diastolic function.  ------------------------------------------------------------------- Aortic valve:  Trileaflet; mildly thickened, mildly calcified leaflets. Right coronary cusp mobility was severely restricted. Sclerosis without stenosis. Doppler: There was no significant regurgitation.  ------------------------------------------------------------------- Aorta: Aortic root: The aortic root was normal in size. Ascending aorta: The ascending aorta was normal in size.  ------------------------------------------------------------------- Mitral valve:  Structurally normal valve.  Leaflet separation was normal. Doppler: Transvalvular velocity was within the normal range. There was no evidence for stenosis. There was mild regurgitation.  ------------------------------------------------------------------- Left atrium: The atrium was severely dilated.  ------------------------------------------------------------------- Atrial septum: No defect or patent foramen ovale was identified.  ------------------------------------------------------------------- Right ventricle: The cavity size was mildly dilated. Systolic function was moderately  reduced.  ------------------------------------------------------------------- Pulmonic valve:  Poorly visualized. Doppler: There was trivial regurgitation.  ------------------------------------------------------------------- Tricuspid valve:  Structurally normal valve.  Leaflet separation was normal. Doppler: Transvalvular velocity was within  the normal range. There was mild regurgitation.  ------------------------------------------------------------------- Pulmonary artery:  Systolic pressure was within the normal range.  ------------------------------------------------------------------- Right atrium: The atrium was moderately to severely dilated.  ------------------------------------------------------------------- Pericardium: There was no pericardial effusion.  ------------------------------------------------------------------- Measurements  Left ventricle             Value    Reference LV ID, ED, PLAX chordal    (H)   53.9 mm   43 - 52 LV ID, ES, PLAX chordal    (H)   38.7 mm   23 - 38 LV fx shortening, PLAX chordal (L)   28  %   >=29 LV PW thickness, ED          11.6 mm   --------- IVS/LV PW ratio, ED          0.89     <=1.3  Ventricular septum           Value    Reference IVS thickness, ED           10.3 mm   ---------  LVOT                  Value    Reference LVOT ID, S               20  mm   --------- LVOT area               3.14 cm^2  ---------  Aorta                 Value    Reference Aortic root ID, ED           37  mm   ---------  Left atrium              Value    Reference LA ID, A-P, ES             56  mm   --------- LA ID/bsa, A-P         (H)   2.31 cm/m^2 <=2.2 LA volume, S               75.9 ml   --------- LA volume/bsa, S            31.3 ml/m^2 --------- LA volume, ES, 1-p A4C         58.8 ml   --------- LA volume/bsa, ES, 1-p A4C       24.3 ml/m^2 --------- LA volume, ES, 1-p A2C         83  ml   --------- LA volume/bsa, ES, 1-p A2C       34.3 ml/m^2 ---------  Pulmonary arteries           Value    Reference PA pressure, S, DP           28  mm Hg <=30  Tricuspid valve            Value    Reference Tricuspid regurg peak velocity     226  cm/s  --------- Tricuspid peak RV-RA gradient     20  mm Hg ---------  Systemic veins             Value    Reference Estimated CVP             8   mm Hg ---------  Right ventricle            Value    Reference RV  pressure, S, DP           28  mm Hg <=30  Legend: (L) and (H) mark values outside specified reference range.  ------------------------------------------------------------------- Prepared and Electronically Authenticated by  Sanda Klein, MD 2016-01-10T16:45:16   Impression:  The patient remains clinically stable more than 1 year following coronary artery bypass grafting 2 for severe left main coronary artery disease. He has developed persistent atrial flutter for which he is chronically anticoagulated using Eliquis.  He describes stable symptoms of exertional shortness of breath consistent with chronic combined systolic and diastolic congestive heart failure, New York Heart Association functional class 1-2.  He remains significantly overweight.  Plan:  I have encouraged patient to continue to exercise on a regular basis as he has been doing. The potential benefits of weight loss and a strict diet have been emphasized. We have not recommended any changes to his current medications. In the future the patient will call and return  to see [place] as needed. All of his questions have been addressed.  I spent in excess of 15 minutes during the conduct of this office consultation and >50% of this time involved direct face-to-face encounter with the patient for counseling and/or coordination of their care.    Valentina Gu. Roxy Manns, MD 05/15/2015 1:47 PM

## 2015-05-15 NOTE — Patient Instructions (Signed)
Continue all previous medications without any changes at this time  Make every effort to stay physically active, get some type of exercise on a regular basis, and stick to a "heart healthy diet".  The long term benefits for regular exercise and a healthy diet are critically important to your overall health and wellbeing.   You have been advised of the numerous benefits associated with regular exercise and weight loss.  The long term benefits for your overall health and wellbeing cannot be overestimated.    

## 2015-05-26 LAB — HM DIABETES EYE EXAM

## 2015-06-01 ENCOUNTER — Encounter: Payer: Self-pay | Admitting: *Deleted

## 2015-06-23 ENCOUNTER — Telehealth: Payer: Self-pay | Admitting: *Deleted

## 2015-06-23 NOTE — Progress Notes (Signed)
HPI: FU coronary artery disease, hypertension, and hyperlipidemia. Cath 7/15 with severe CAD and EF 50. He underwent bypass grafting x2 with LIMA to the LAD and vein graft to the first obtuse marginal branch of the left circumflex. Abd ultrasound 9/15 showed no aneurysm. Echo 1/16 showed EF 40-45, severe LAE, moderate to severe RAE, mild RVE, moderate reduction in RV function and mild MR. Admitted 1/16 with a flutter and new CVA. Treated with apixaban. Carotid dopplers 8/16 showed 40-59 right and 1-39 left. FU recommended 2 years. Since last seen, He notes some dyspnea on exertion. No orthopnea, PND, chest pain or syncope. He does have pedal edema.  Current Outpatient Prescriptions  Medication Sig Dispense Refill  . acetaminophen (TYLENOL) 500 MG tablet Take 1,000 mg by mouth at bedtime as needed for mild pain.     Marland Kitchen apixaban (ELIQUIS) 5 MG TABS tablet Take 1 tablet (5 mg total) by mouth 2 (two) times daily. 180 tablet 3  . atorvastatin (LIPITOR) 80 MG tablet Take 80 mg by mouth every evening.    Marland Kitchen CALCIUM-MAGNESIUM-ZINC PO Take 1 each by mouth daily.    . Cholecalciferol (VITAMIN D) 2000 UNITS tablet Take 4,000 Units by mouth daily.     . Coenzyme Q10 (CO Q-10) 100 MG CAPS Take 1 capsule by mouth daily.    . fluticasone (FLONASE) 50 MCG/ACT nasal spray USE 2 SPRAYS NASALLY DAILY 48 g 1  . furosemide (LASIX) 40 MG tablet Take 1 tablet (40 mg total) by mouth daily. 90 tablet 3  . IRON PO Take 25 mg by mouth daily.    Marland Kitchen loratadine (CLARITIN) 10 MG tablet Take 10 mg by mouth daily.     . metoprolol tartrate (LOPRESSOR) 25 MG tablet Take 1 tablet (25 mg total) by mouth 2 (two) times daily. 60 tablet 3  . Multiple Vitamin (MULTIVITAMIN) tablet Take 1 tablet by mouth daily. Centrum Silver    . NITROSTAT 0.4 MG SL tablet Place 0.4 mg under the tongue every 5 (five) minutes as needed for chest pain.     Marland Kitchen omeprazole (PRILOSEC) 20 MG capsule Take 20 mg by mouth every other day.     . potassium  chloride (K-DUR) 10 MEQ tablet Take 1 tablet (10 mEq total) by mouth daily. 90 tablet 3  . STUDY MEDICATION Take 1 tablet by mouth every evening. [facility] for cholesterol. Study medication Anacetrapib or placebo    . testosterone cypionate (DEPOTESTOTERONE CYPIONATE) 200 MG/ML injection Inject 200 mg into the muscle every 14 (fourteen) days.      No current facility-administered medications for this visit.     Past Medical History  Diagnosis Date  . CAD (coronary artery disease)   . HTN (hypertension)   . Prostatitis   . Hyperlipidemia   . Obesity   . Diverticular disease   . Gallstones   . Hepatitis age 74  . Arthritis   . Sleep apnea     mild, CPAP wasn't indicated (per pt)--2014 mod-severe, on CPAP since 07/2013  . Benign prostatic hypertrophy     followed by Dr. Gaynelle Arabian  . Cerebrovascular disease   . Sepsis due to Escherichia coli [facility]) ? 2011    DUE TO URINARY OUTLET OBSTRUCTION /INFECTION  . UTI (urinary tract infection)     secondary  . Allergy     RHINITIS  . Myocardial infarction [facility]) '98, '09    (Dr. Stanford Breed); s/p stents  . GERD (gastroesophageal reflux disease)   .  Hypogonadism male     treated by Dr. Gaynelle Arabian  . Erectile dysfunction   . Peripheral vascular disease ([place])     02/28/12 MOST RECENT CAROTID DUPLEX--"STABLE MILD CALCIFIED PLAQUE, BILATERALY, STABLE, OVER SERIAL EXAMS"  . 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 IFG (impaired fasting glucose)   . Pneumonia     hx of  . 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 Diverticulosis     seen on colonoscopy (9/05, 06/2009)  . Hyperplastic colon polyp 05/2004    Past Surgical History  Procedure Laterality Date  . Total knee arthroplasty  L 4/06, R 4/07  . Vasectomy    . Tonsillectomy    . Transurethral resection of prostate  06/2010  . Knee surgery  05/2010    removing scar tissue R knee (Dr. Wynelle Link)  . Coronary angioplasty with stent  placement  01/1997    PTCA/stent to RCA  AND 2009 2ND STENT PLACED  . Joint replacement      both knees  . Total knee revision  09/23/2012    Procedure: TOTAL KNEE REVISION;  Surgeon: Gearlean Alf, MD;  Location: [facility];  Service: Orthopedics;  Laterality: Right;  . Coronary artery bypass graft N/A 04/15/2014    Procedure: CORONARY ARTERY BYPASS GRAFTING TIMES TWO USEING LEFT INTERNAL MAMMARY ARTERY AND RIGHT GREATER SAPHENOUS VEIN VIA ENDOVEIN HARVEST;  Surgeon: Rexene Alberts, MD;  Location: [facility];  Service: Open Heart Surgery;  Laterality: N/A;  . Intraoperative transesophageal echocardiogram N/A 04/15/2014    Procedure: INTRAOPERATIVE TRANSESOPHAGEAL ECHOCARDIOGRAM;  Surgeon: Rexene Alberts, MD;  Location: [facility];  Service: Open Heart Surgery;  Laterality: N/A;  . Left and right heart catheterization with coronary angiogram  04/13/2014    Procedure: LEFT AND RIGHT HEART CATHETERIZATION WITH CORONARY ANGIOGRAM;  Surgeon: Sinclair Grooms, MD;  Location: [facility];  Service: Cardiovascular;;    Social History   Social History  . Marital Status: Married    Spouse Name: N/A  . Number of Children: 4  . Years of Education: N/A   Occupational History  . CPA    Social History Main Topics  . Smoking status: Former Smoker -- 1.50 packs/day for 30 years    Types: Cigarettes    Quit date: 09/16/1986  . Smokeless tobacco: Never Used     Comment: 35 pack year history, none in 18 years  . Alcohol Use: 0.0 oz/week    0 Standard drinks or equivalent per week     Comment: 1 glass of wine twice a week (previously daily)  . Drug Use: No  . Sexual Activity: Not Currently   Other Topics Concern  . Not on file   Social History Narrative   Lives with wife.  4 kids, 1 stepchild (1 in [place], 4 in [place]). No pets. Wife is a diabetic    ROS: no fevers or chills, productive cough, hemoptysis, dysphasia, odynophagia, melena, hematochezia, dysuria, hematuria, rash, seizure activity, orthopnea, PND,  claudication. Remaining systems are negative.  Physical Exam: Well-developed obese in no acute distress.  Skin is warm and dry.  HEENT is normal.  Neck is supple.  Chest is clear to auscultation with normal expansion.  Cardiovascular exam is regular rate and rhythm.  Abdominal exam nontender or distended. No masses palpated. Extremities show 1+ edema. neuro grossly intact  ECG Sinus rhythm at a rate  of 67. Anterior infarct.

## 2015-06-23 NOTE — Telephone Encounter (Signed)
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 As part of the study, he has been taking atorvastatin 80 mg daily and anacetrapib 100 mg or placebo daily.  Both medications were provided to the patient as part of the study.  The study has ended and he will need a prescription for a statin.  It is recommended that he continues on a similar lipid-lowering potency to that which he has been taking during the study. Also, it is advised against measuring lipids at this stage as it may be difficult to interpret the results until several weeks after stopping the study treatment. He will stop all study medication on July 04, 2015.  So that statin therapy is not interrupted, please call in a prescription for statin therapy for him.   Thank you, Blossom Hoops, RN, Clinical Research Nurse

## 2015-06-23 NOTE — Telephone Encounter (Signed)
Spoke with pt, he currently does not need any atorvastatin.

## 2015-06-27 ENCOUNTER — Encounter: Payer: Self-pay | Admitting: Cardiology

## 2015-06-27 ENCOUNTER — Ambulatory Visit (INDEPENDENT_AMBULATORY_CARE_PROVIDER_SITE_OTHER): Payer: 59 | Admitting: Cardiology

## 2015-06-27 VITALS — BP 136/80 | HR 67 | Ht 68.0 in | Wt 275.0 lb

## 2015-06-27 DIAGNOSIS — I10 Essential (primary) hypertension: Secondary | ICD-10-CM | POA: Diagnosis not present

## 2015-06-27 DIAGNOSIS — I4892 Unspecified atrial flutter: Secondary | ICD-10-CM | POA: Diagnosis not present

## 2015-06-27 DIAGNOSIS — I5031 Acute diastolic (congestive) heart failure: Secondary | ICD-10-CM

## 2015-06-27 MED ORDER — LOSARTAN POTASSIUM 25 MG PO TABS: 25.0000 mg | ORAL_TABLET | Freq: Every day | ORAL | Status: DC

## 2015-06-27 MED ORDER — SPIRONOLACTONE 25 MG PO TABS: 25.0000 mg | ORAL_TABLET | Freq: Every day | ORAL | Status: DC

## 2015-06-27 NOTE — Assessment & Plan Note (Signed)
Continue statin. Not on aspirin given need for anticoagulation. 

## 2015-06-27 NOTE — Assessment & Plan Note (Signed)
Patient remains in sinus rhythm.Given history of CVA I would not be comfortable not anticoagulating the patient. We will therefore not refer for ablation. Continue beta blocker and apixaban.

## 2015-06-27 NOTE — Assessment & Plan Note (Signed)
Blood pressure controlled. Continue present medications. 

## 2015-06-27 NOTE — Assessment & Plan Note (Signed)
Patient is mildly volume overloaded on examination and ejection fraction mildly reduced. Discontinue potassium. Begin spironolactone 25 mg daily. Add Cozaar 25 mg daily. Check potassium and renal function in 1 week. Advanced medications as tolerated.

## 2015-06-27 NOTE — Assessment & Plan Note (Signed)
Continue statin. 

## 2015-06-27 NOTE — Patient Instructions (Signed)
Medication Instructions:   STOP POTASSIUM  START SPIRONOLACTONE 25 MG ONCE DAILY  START LOSARTAN 25 MG ONCE DAILY  Labwork:  Your physician recommends that you return for lab work in [place]:  Your physician recommends that you schedule a follow-up appointment in: [place]

## 2015-06-27 NOTE — Assessment & Plan Note (Signed)
Follow-up carotid Dopplers August 2018.

## 2015-06-29 ENCOUNTER — Encounter: Payer: Self-pay | Admitting: Neurology

## 2015-06-29 ENCOUNTER — Ambulatory Visit (INDEPENDENT_AMBULATORY_CARE_PROVIDER_SITE_OTHER): Payer: 59 | Admitting: Neurology

## 2015-06-29 VITALS — BP 117/67 | HR 72 | Ht 67.0 in | Wt 273.8 lb

## 2015-06-29 DIAGNOSIS — I639 Cerebral infarction, unspecified: Secondary | ICD-10-CM

## 2015-06-29 DIAGNOSIS — I6990 Unspecified sequelae of unspecified cerebrovascular disease: Secondary | ICD-10-CM | POA: Diagnosis not present

## 2015-06-29 NOTE — Patient Instructions (Signed)
I had a long d/w patient about his recent stroke, risk for recurrent stroke/TIAs, personally independently reviewed imaging studies and stroke evaluation results and answered questions.Continue Eliquis  for secondary stroke prevention for atrial Fibrillation and maintain strict control of hypertension with blood pressure goal below 130/90, diabetes with hemoglobin A1c goal below 6.5% and lipids with LDL cholesterol goal below 100 mg/dL. I also advised the patient to eat a healthy diet with plenty of whole grains, cereals, fruits and vegetables, exercise regularly and maintain ideal body weight Followup in the future with me in  1 year or call earlier if necessary Diet for Metabolic Syndrome Metabolic syndrome is a disorder that includes at least three of these conditions:  Abdominal obesity.  Too much sugar in your blood.  High blood pressure.  Higher than normal amount of fat (lipids) in your blood.  Lower than normal level of "good" cholesterol (HDL). Following a healthy diet can help to keep metabolic syndrome under control. It can also help to prevent the development of conditions that are associated with metabolic syndrome, such as diabetes, heart disease, and stroke. Along with exercise, a healthy diet:  Helps to improve the way that the body uses insulin.  Promotes weight loss. A common goal for people with this condition is to lose at least 7 to 10 percent of their starting weight. WHAT DO I NEED TO KNOW ABOUT THIS DIET?  Use the glycemic index (GI) to plan your meals. The index tells you how quickly a food will raise your blood sugar. Choose foods that have low GI values. These foods take a longer time to raise blood sugar.  Keep track of how many calories you take in. Eating the right amount of calories will help your achieve a healthy weight.  You may want to follow a Mediterranean diet. This diet includes lots of vegetables, lean meats or fish, whole grains, fruits, and healthy  oils and fats. WHAT FOODS CAN I EAT? Grains Stone-ground whole wheat. Pumpernickel bread. Whole-grain bread, crackers, tortillas, cereal, and pasta. Unsweetened oatmeal.Bulgur.Barley.Quinoa.Brown rice or wild rice. Vegetables Lettuce. Spinach. Peas. Beets. Cauliflower. Cabbage. Broccoli. Carrots. Tomatoes. Squash. Eggplant. Herbs. Peppers. Onions. Cucumbers. Brussels sprouts. Sweet potatoes. Yams. Beans. Lentils. Fruits Berries. Apples. Oranges. Grapes. Mango. Pomegranate. Kiwi. Cherries. Meats and Other Protein Sources Seafood and shellfish. Lean meats.Poultry. Tofu. Dairy Low-fat or fat-free dairy products, such as milk, yogurt, and cheese. Beverages Water. Low-fat milk. Milk alternatives, like soy milk or almond milk. Real fruit juice. Condiments Low-sugar or sugar-free ketchup, barbecue sauce, and mayonnaise. Mustard. Relish. Fats and Oils Avocado. Canola or olive oil. Nuts and nut butters.Seeds. The items listed above may not be a complete list of recommended foods or beverages. Contact your dietitian for more options.  WHAT FOODS ARE NOT RECOMMENDED? Red meat. Palm oil and coconut oil. Processed foods. Fried foods. Alcohol. Sweetened drinks, such as iced tea and soda. Sweets. Salty foods. The items listed above may not be a complete list of foods and beverages to avoid. Contact your dietitian for more information.   This information is not intended to replace advice given to you by your health care provider. Make sure you discuss any questions you have with your health care provider.   Document Released: 01/17/2015 Document Reviewed: 01/17/2015 Elsevier Interactive Patient Education [employer].

## 2015-06-29 NOTE — Progress Notes (Signed)
[facility] [address] [place]. [place] [address] [phone]       OFFICE FOLLOW-UP NOTE  [name] Date of Birth:  November 14, 1940 Medical Record Number:  [id number]   HPI: 11 year Caucasian male seen today for the first office follow-up visit following hospital admission for stroke. He was admitted on 09/25/14 with lightheadedness associated with headache and right-sided visual field disturbance. He was found to be in new onset atrial flutter fibrillation the emergency room. MRI scan of the brain showed a small left occipital infarct and mild changes of small vessel disease. MRA of the brain showed no significant stenosis and carotid ultrasound showed no extracranial stenosis. Transthoracic echo showed normal ejection fraction. LDL cholesterol was 51 mg percent hemoglobin A1c was 6.0. Urine drug screen was negative. He was started on eliquis for second stroke prevention and states his done well and is 90% better. He has only very minimum right upper vision quadrant of vision difficulties. He feels his memory is fine he works as a Licensed conveyancer and has had no trouble doing his taxes this year. She is on metoprolol for heart rate and states that while exercising last week he may have gone back present into atrial flutter. He does use CPAP regularly every night for sleep apnea. He has been exercising for almost one hour 5 days a week regularly. He has no other complaints today. Update 06/29/2015 : He returns for follow-up after last visit 6 months ago. He remains on eliquis which is tolerating well without bleeding bruising or other side effects. He states blood pressure is well controlled and today it is 118/68 in office. He states his peripheral vision loss has improved completely. Hie saw eye doctor a month ago and he found no deficits. He has been driving okay without problems. He remains on Lipitor without myalgias or arthralgias. In fact he is completing participation in  a new lipid drug study at [employer]  cardiovascular research to   improve HDL levels. ROS:   14 system review of systems is positive for   cough, shortness of breath, leg swelling, aching muscles, muscle cramps and all other systems negative  PMH:  Past Medical History  Diagnosis Date  . CAD (coronary artery disease)   . HTN (hypertension)   . Prostatitis   . Hyperlipidemia   . Obesity   . Diverticular disease   . Gallstones   . Hepatitis age 38  . Arthritis   . Sleep apnea     mild, CPAP wasn't indicated (per pt)--2014 mod-severe, on CPAP since 07/2013  . Benign prostatic hypertrophy     followed by Dr. Gaynelle Arabian  . Cerebrovascular disease   . Sepsis due to Escherichia coli [facility]) ? 2011    DUE TO URINARY OUTLET OBSTRUCTION /INFECTION  . UTI (urinary tract infection)     secondary  . Allergy     RHINITIS  . Myocardial infarction [facility]) '98, '09    (Dr. Stanford Breed); s/p stents  . GERD (gastroesophageal reflux disease)   . Hypogonadism male     treated by Dr. Gaynelle Arabian  . Erectile dysfunction   . Peripheral vascular disease ([place])     02/28/12 MOST RECENT CAROTID DUPLEX--"STABLE MILD CALCIFIED PLAQUE, BILATERALY, STABLE, OVER SERIAL EXAMS"  . 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 IFG (impaired fasting glucose)   . Pneumonia     hx of  . 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 Diverticulosis     seen on colonoscopy (9/05, 06/2009)  . Hyperplastic colon polyp 05/2004  . Stroke [facility])     Social History:  Social History   Social History  . Marital Status: Married    Spouse Name: N/A  . Number of Children: 4  . Years of Education: N/A   Occupational History  . CPA    Social History Main Topics  . Smoking status: Former Smoker -- 1.50 packs/day for 30 years    Types: Cigarettes    Quit date: 09/16/1986  . Smokeless tobacco: Never Used     Comment: 35 pack year history, none in 18 years  . Alcohol Use: 0.0 oz/week     0 Standard drinks or equivalent per week     Comment: 1 glass of wine twice a week (previously daily)  . Drug Use: No  . Sexual Activity: Not Currently   Other Topics Concern  . Not on file   Social History Narrative   Lives with wife.  4 kids, 1 stepchild (1 in [place], 4 in [place]). No pets. Wife is a diabetic    Medications:   Current Outpatient Prescriptions on File Prior to Visit  Medication Sig Dispense Refill  . acetaminophen (TYLENOL) 500 MG tablet Take 1,000 mg by mouth at bedtime as needed for mild pain.     Marland Kitchen apixaban (ELIQUIS) 5 MG TABS tablet Take 1 tablet (5 mg total) by mouth 2 (two) times daily. 180 tablet 3  . atorvastatin (LIPITOR) 80 MG tablet Take 80 mg by mouth every evening.    Marland Kitchen CALCIUM-MAGNESIUM-ZINC PO Take 1 each by mouth daily.    . Cholecalciferol (VITAMIN D) 2000 UNITS tablet Take 4,000 Units by mouth daily.     . Coenzyme Q10 (CO Q-10) 100 MG CAPS Take 1 capsule by mouth daily.    . fluticasone (FLONASE) 50 MCG/ACT nasal spray USE 2 SPRAYS NASALLY DAILY 48 g 1  . furosemide (LASIX) 40 MG tablet Take 1 tablet (40 mg total) by mouth daily. 90 tablet 3  . IRON PO Take 25 mg by mouth daily.    Marland Kitchen loratadine (CLARITIN) 10 MG tablet Take 10 mg by mouth daily.     Marland Kitchen losartan (COZAAR) 25 MG tablet Take 1 tablet (25 mg total) by mouth daily. 90 tablet 3  . metoprolol tartrate (LOPRESSOR) 25 MG tablet Take 1 tablet (25 mg total) by mouth 2 (two) times daily. 60 tablet 3  . Multiple Vitamin (MULTIVITAMIN) tablet Take 1 tablet by mouth daily. Centrum Silver    . NITROSTAT 0.4 MG SL tablet Place 0.4 mg under the tongue every 5 (five) minutes as needed for chest pain.     Marland Kitchen omeprazole (PRILOSEC) 20 MG capsule Take 20 mg by mouth every other day.     . spironolactone (ALDACTONE) 25 MG tablet Take 1 tablet (25 mg total) by mouth daily. 90 tablet 3  . STUDY MEDICATION Take 1 tablet by mouth every evening. [facility] for cholesterol. Study medication Anacetrapib or placebo      . testosterone cypionate (DEPOTESTOTERONE CYPIONATE) 200 MG/ML injection Inject 200 mg into the muscle every 14 (fourteen) days.      No current facility-administered medications on file prior to visit.    Allergies:  No Known Allergies  Physical Exam General: Obese middle-age Caucasian male, seated, in no evident distress Head: head normocephalic and atraumatic.  Neck: supple with no  carotid or supraclavicular bruits Cardiovascular: regular rate and rhythm, no murmurs Musculoskeletal: no deformity Skin:  no rash/petichiae Vascular:  Normal pulses all extremities Filed Vitals:   06/29/15 1319  BP: 117/67  Pulse: 72   Neurologic Exam Mental Status: Awake and fully alert. Oriented to place and time. Recent and remote memory intact. Attention span, concentration and fund of knowledge appropriate. Mood and affect appropriate.  Cranial Nerves: Fundoscopic exam reveals sharp disc margins. Pupils equal, briskly reactive to light. Extraocular movements full without nystagmus. Visual fields full to confrontation except partial RU quadrant defect in right eye. Hearing intact. Facial sensation intact. Face, tongue, palate moves normally and symmetrically.  Motor: Normal bulk and tone. Normal strength in all tested extremity muscles. Sensory.: intact to touch ,pinprick .position and vibratory sensation.  Coordination: Rapid alternating movements normal in all extremities. Finger-to-nose and heel-to-shin performed accurately bilaterally. Gait and Station: Arises from chair without difficulty. Stance is normal. Gait demonstrates normal stride length and balance . Able to heel, toe and tandem walk without difficulty.  Reflexes: 1+ and symmetric. Toes downgoing.     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 Multiple vascular risk factors of hypertension, hyperlipidemia, heart disease, obesity , sleep apnoea and  atrial flutter.    PLAN:  I had a long d/w patient about his recent stroke, risk for recurrent stroke/TIAs, personally independently reviewed imaging studies and stroke evaluation results and answered questions.Continue Eliquis  for secondary stroke prevention for atrial Fibrillation and maintain strict control of hypertension with blood pressure goal below 130/90, diabetes with hemoglobin A1c goal below 6.5% and lipids with LDL cholesterol goal below 100 mg/dL. I also advised the patient to eat a healthy diet with plenty of whole grains, cereals, fruits and vegetables, exercise regularly and maintain ideal body weight Followup in the future with me in  1 year or call earlier if necessary Jeffrey Contras, MD  Note: This document was prepared with digital dictation and possible smart phrase technology. Any transcriptional errors that result from this process are unintentional

## 2015-07-03 ENCOUNTER — Other Ambulatory Visit: Payer: Self-pay | Admitting: Cardiology

## 2015-07-03 MED ORDER — NITROGLYCERIN 0.4 MG SL SUBL: 0.4000 mg | SUBLINGUAL_TABLET | SUBLINGUAL | Status: DC | PRN

## 2015-07-03 MED ORDER — ATORVASTATIN CALCIUM 80 MG PO TABS: 80.0000 mg | ORAL_TABLET | Freq: Every evening | ORAL | Status: DC

## 2015-07-03 NOTE — Telephone Encounter (Signed)
Rx(s) sent to pharmacy electronically.  

## 2015-07-03 NOTE — Telephone Encounter (Signed)
°  STAT if patient is at the pharmacy , call can be transferred to refill team.   1. Which medications need to be refilled?Lipitor 80mg  and Nitrostat 0.4mg    2. Which pharmacy/location is medication to be sent to?[facility]   3. Do they need a 30 day or 90 day supply? 9 day for the Lipitor and 30 for the Nitrostat 0.4mg

## 2015-07-05 ENCOUNTER — Other Ambulatory Visit: Payer: Self-pay

## 2015-07-12 LAB — BASIC METABOLIC PANEL
BUN: 14 mg/dL (ref 7–25)
CO2: 28 mmol/L (ref 20–31)
Calcium: 9.1 mg/dL (ref 8.6–10.3)
Chloride: 99 mmol/L (ref 98–110)
Creat: 0.99 mg/dL (ref 0.70–1.18)
GLUCOSE: 104 mg/dL — AB (ref 65–99)
Potassium: 4.6 mmol/L (ref 3.5–5.3)
SODIUM: 139 mmol/L (ref 135–146)

## 2015-08-08 ENCOUNTER — Other Ambulatory Visit: Payer: Self-pay

## 2015-08-08 MED ORDER — FUROSEMIDE 40 MG PO TABS: 40.0000 mg | ORAL_TABLET | Freq: Every day | ORAL | Status: DC

## 2015-08-24 ENCOUNTER — Encounter: Payer: Self-pay | Admitting: Family Medicine

## 2015-08-24 ENCOUNTER — Ambulatory Visit (INDEPENDENT_AMBULATORY_CARE_PROVIDER_SITE_OTHER): Payer: 59 | Admitting: Family Medicine

## 2015-08-24 VITALS — BP 122/82 | HR 64 | Ht 68.0 in | Wt 275.6 lb

## 2015-08-24 DIAGNOSIS — E669 Obesity, unspecified: Secondary | ICD-10-CM

## 2015-08-24 DIAGNOSIS — E7800 Pure hypercholesterolemia, unspecified: Secondary | ICD-10-CM

## 2015-08-24 DIAGNOSIS — D509 Iron deficiency anemia, unspecified: Secondary | ICD-10-CM

## 2015-08-24 DIAGNOSIS — E119 Type 2 diabetes mellitus without complications: Secondary | ICD-10-CM

## 2015-08-24 DIAGNOSIS — I10 Essential (primary) hypertension: Secondary | ICD-10-CM

## 2015-08-24 DIAGNOSIS — K219 Gastro-esophageal reflux disease without esophagitis: Secondary | ICD-10-CM

## 2015-08-24 DIAGNOSIS — R7301 Impaired fasting glucose: Secondary | ICD-10-CM

## 2015-08-24 DIAGNOSIS — G4733 Obstructive sleep apnea (adult) (pediatric): Secondary | ICD-10-CM

## 2015-08-24 DIAGNOSIS — J309 Allergic rhinitis, unspecified: Secondary | ICD-10-CM

## 2015-08-24 LAB — IRON: Iron: 33 ug/dL — ABNORMAL LOW (ref 50–180)

## 2015-08-24 LAB — FERRITIN: FERRITIN: 11 ng/mL — AB (ref 22–322)

## 2015-08-24 LAB — POCT GLYCOSYLATED HEMOGLOBIN (HGB A1C): HEMOGLOBIN A1C: 5.6

## 2015-08-24 NOTE — Patient Instructions (Signed)
Continue your current medications. Try and work harder on diet (portions, food choices, etc) for weight loss.  Consider looking into Weight Watchers, vs re-considering seeing a nutritionist through [facility].  Let us know if you would like a referral.

## 2015-08-24 NOTE — Progress Notes (Signed)
Chief Complaint  Patient presents with  . Hypertension    nonfasting med check.(ancient grains bread, 2 slices with fake butter and coffee with creamer.    Diabetes. He has been following a diabetic diet.He goes to the fitness center, getting exercise 5x/week. He was started on metformin in the hospital (03/2014), but it was stopped when they never let him participate in rehab (or made him eat so much to get his sugar up, that he was gaining weight). Diabetes was diagnosed 05/2013 based on A1c of 6.7, only took med for that very short while. No polydipsia, polyuria, numbness, tingling, vision or feet concerns.  Iron deficiency, mild anemia:  He started taking iron after his last CBC Lab Results  Component Value Date   WBC 8.2 02/22/2015   HGB 12.8* 02/22/2015   HCT 43.2 02/22/2015   MCV 69.7* 02/22/2015   PLT 296 02/22/2015  Due for recheck today.  Hyperlipidemia: He completed the study drug a couple of months ago (he believes it was likely placebo, because HDL didn't improve like it was supposed to); he continues on 4m of lipitor. He isn't having side effects or problems.  He saw Dr. SLeonie Man2 months ago for f/u stroke (embolic L occipital infarct in 09/2014. He is to continue Eliquis for stroke prevention for atrial fibrillation. He also last saw cardiology 2 months ago.  Given the stroke, cardiologist preferred to continue long-term anticoagulation, rather than referring for ablation (and then potentially stopping). Continuing statin, anticoagulation, beta blocker.  Carotid dopplers will be due again 04/2017. Cozaar 245mwas added, as well as spironolactone 2528mue to some volume overload noted on exam, with mildly decreased EF. He denies any side effects, and had b-met checked after starting, which was normal.  He doesn't check his blood pressure elsewhere.  He only occasionally feels dizzy--like very mild vertigo (residual from stroke symptoms).  He thinks he didn't drink enough  fluid--felt better after drinking. Only occurs very rarely.  Denies lightheadedness.  Denies any chest pain, palpitations.  Notices possible fib/flutter only when looking at heart rate monitor on the machines at the gym (can't feel it).  OSA: compliant with CPAP use nightly.  GERD--controlled with meds. He takes the prilosec every other day; sometimes has recurrent heartburn on the days he doesn't take, but if he knows he will be eating something that will trigger it, he takes it anyway. Denies dysphagia. He coughs less since taking prilosec, but still has a cough that gets up clear mucus a few times/day.  He does have allergies, which are controlled with claritin and flonase.  PMH, PSH, SH and FH reviewed and updated.  Outpatient Encounter Prescriptions as of 08/24/2015  Medication Sig Note  . acetaminophen (TYLENOL) 500 MG tablet Take 1,000 mg by mouth at bedtime as needed for mild pain.    . aMarland Kitchenixaban (ELIQUIS) 5 MG TABS tablet Take 1 tablet (5 mg total) by mouth 2 (two) times daily.   . aMarland Kitchenorvastatin (LIPITOR) 80 MG tablet Take 1 tablet (80 mg total) by mouth every evening.   . CMarland KitchenLCIUM-MAGNESIUM-ZINC PO Take 1 each by mouth daily.   . Cholecalciferol (VITAMIN D) 2000 UNITS tablet Take 4,000 Units by mouth daily.    . Coenzyme Q10 (CO Q-10) 100 MG CAPS Take 1 capsule by mouth daily.   . fluticasone (FLONASE) 50 MCG/ACT nasal spray USE 2 SPRAYS NASALLY DAILY   . furosemide (LASIX) 40 MG tablet Take 1 tablet (40 mg total) by mouth daily.   .Marland Kitchen  IRON PO Take 25 mg by mouth daily. 08/24/2015: Started 3 months ago  . loratadine (CLARITIN) 10 MG tablet Take 10 mg by mouth daily.    Marland Kitchen losartan (COZAAR) 25 MG tablet Take 1 tablet (25 mg total) by mouth daily.   . metoprolol tartrate (LOPRESSOR) 25 MG tablet Take 1 tablet (25 mg total) by mouth 2 (two) times daily.   . Multiple Vitamin (MULTIVITAMIN) tablet Take 1 tablet by mouth daily. Centrum Silver   . omeprazole (PRILOSEC) 20 MG capsule Take 20  mg by mouth every other day.    . spironolactone (ALDACTONE) 25 MG tablet Take 1 tablet (25 mg total) by mouth daily.   Marland Kitchen testosterone cypionate (DEPOTESTOTERONE CYPIONATE) 200 MG/ML injection Inject 200 mg into the muscle every 14 (fourteen) days.  08/24/2015: Sees Dr. Gaynelle Arabian  . nitroGLYCERIN (NITROSTAT) 0.4 MG SL tablet Place 1 tablet (0.4 mg total) under the tongue every 5 (five) minutes as needed for chest pain. (Patient not taking: Reported on 08/24/2015)    No facility-administered encounter medications on file as of 08/24/2015.   No Known Allergies  ROS: no fever, chills, weight changes, headaches, numbness, tingling, syncope, chest pain, palpitations, shortness of breath, nausea, vomiting, frequent heartburn, bowel changes, bleeding, bruising, rashes, joint pains, depression or other concerns. See HPI  PHYSICAL EXAM: BP 122/82 mmHg  Pulse 64  Ht 5' 8"  (1.727 m)  Wt 275 lb 9.6 oz (125.011 kg)  BMI 41.91 kg/m2  Pleasant, obese male, in good spirits HEENT: PERRL, EOMI, conjunctiva clear. OP clear Neck: no lymphadenopathy or mass Heart:  Regular rhythm, occasional ectopy noted. 2-3/6 murmur noted. Lungs: clear bilaterally Abdomen: obese, soft, no mass. Diastasis recti, nontender Extremities: Trace pretib, 1+ at ankles (hasn't taken lasix yet today). Psych: normal mood, affect, hygiene and grooming Neuro: alert and oriented. Cranial nerves intact. Normal strength, gait  Lab Results  Component Value Date   HGBA1C 5.6 08/24/2015     Chemistry      Component Value Date/Time   NA 139 07/11/2015 1029   K 4.6 07/11/2015 1029   CL 99 07/11/2015 1029   CO2 28 07/11/2015 1029   BUN 14 07/11/2015 1029   CREATININE 0.99 07/11/2015 1029   CREATININE 0.90 09/24/2014 1418      Component Value Date/Time   CALCIUM 9.1 07/11/2015 1029   ALKPHOS 46 02/22/2015 0001   AST 21 02/22/2015 0001   ALT 26 02/22/2015 0001   BILITOT 1.1 02/22/2015 0001     (b-met was done 2 weeks after  med changes, by cardiologist).  ASSESSMENT/PLAN:  Controlled type 2 diabetes mellitus without complication, without long-term current use of insulin (HCC) - diet controlled; improved; weight loss encouraged.   IFG (impaired fasting glucose) - Plan: HgB A1c  Anemia, iron deficiency - very mild; due for recheck since starting iron - Plan: CBC with Differential/Platelet, Ferritin, Iron  Pure hypercholesterolemia - had been at goal; will need recheck off study drug--okay to check in June (nonfasting today, and too soon)  Obesity - counseled extensively re: diet. Offered nutritionist (declined; prev saw [employer] and didn't like)  Essential hypertension - controlled  Obstructive sleep apnea - reportedly compliant with CPAP; weight loss encouraged  Gastroesophageal reflux disease, esophagitis presence not specified - controlled  Allergic rhinitis, unspecified allergic rhinitis type - controlled   Obesity Counseled extensively re: diet, portions, healthy choices, ways to hold oneself accountable. Declines dietician--tried in the past and it wasn't helpful Tonie Griffith). Consider WW   CBC, iron  and ferritin today  nonfasting today, so lipids not checked. He has been off study drugs for about 2 months. Recommend recheck at next visit  F/u 6 months (AWV/CPE)

## 2015-08-25 LAB — CBC WITH DIFFERENTIAL/PLATELET
BASOS PCT: 0 % (ref 0–1)
Basophils Absolute: 0 10*3/uL (ref 0.0–0.1)
EOS PCT: 1 % (ref 0–5)
Eosinophils Absolute: 0.1 10*3/uL (ref 0.0–0.7)
HEMATOCRIT: 47 % (ref 39.0–52.0)
HEMOGLOBIN: 14.4 g/dL (ref 13.0–17.0)
LYMPHS PCT: 21 % (ref 12–46)
Lymphs Abs: 1.6 10*3/uL (ref 0.7–4.0)
MCH: 22.8 pg — AB (ref 26.0–34.0)
MCHC: 30.6 g/dL (ref 30.0–36.0)
MCV: 74.5 fL — ABNORMAL LOW (ref 78.0–100.0)
MONO ABS: 1 10*3/uL (ref 0.1–1.0)
Monocytes Relative: 13 % — ABNORMAL HIGH (ref 3–12)
NEUTROS ABS: 4.9 10*3/uL (ref 1.7–7.7)
NEUTROS PCT: 65 % (ref 43–77)
Platelets: 233 10*3/uL (ref 150–400)
RBC: 6.31 MIL/uL — ABNORMAL HIGH (ref 4.22–5.81)
RDW: 21.5 % — ABNORMAL HIGH (ref 11.5–15.5)
WBC: 7.6 10*3/uL (ref 4.0–10.5)

## 2015-09-13 ENCOUNTER — Other Ambulatory Visit: Payer: Self-pay | Admitting: Family Medicine

## 2015-09-13 ENCOUNTER — Other Ambulatory Visit: Payer: Self-pay | Admitting: Cardiology

## 2015-09-14 ENCOUNTER — Other Ambulatory Visit: Payer: Self-pay | Admitting: Cardiology

## 2015-09-14 MED ORDER — LOSARTAN POTASSIUM 25 MG PO TABS
25.0000 mg | ORAL_TABLET | Freq: Every day | ORAL | Status: DC
Start: 2015-09-14 — End: 2015-09-21

## 2015-09-14 MED ORDER — SPIRONOLACTONE 25 MG PO TABS: 25.0000 mg | ORAL_TABLET | Freq: Every day | ORAL | Status: DC

## 2015-09-14 NOTE — Telephone Encounter (Signed)
Rx request sent to pharmacy.  

## 2015-09-21 ENCOUNTER — Other Ambulatory Visit: Payer: Self-pay | Admitting: Cardiology

## 2015-09-21 MED ORDER — ATORVASTATIN CALCIUM 80 MG PO TABS: 80.0000 mg | ORAL_TABLET | Freq: Every evening | ORAL | Status: DC

## 2015-09-21 MED ORDER — LOSARTAN POTASSIUM 25 MG PO TABS: 25.0000 mg | ORAL_TABLET | Freq: Every day | ORAL | Status: DC

## 2015-09-21 MED ORDER — SPIRONOLACTONE 25 MG PO TABS: 25.0000 mg | ORAL_TABLET | Freq: Every day | ORAL | Status: DC

## 2015-09-21 NOTE — Telephone Encounter (Signed)
[facility] calling stating that pt would like a refill on Atorvastatin 80 mg, Spironolactone 25 mg and Losartan 25 mg tablets, 90 day supply with refills, [facility] Please advise

## 2015-09-21 NOTE — Telephone Encounter (Signed)
meds refilled electronically #[id number] with no refills.

## 2015-09-25 ENCOUNTER — Ambulatory Visit (INDEPENDENT_AMBULATORY_CARE_PROVIDER_SITE_OTHER): Payer: 59 | Admitting: Cardiology

## 2015-09-25 ENCOUNTER — Telehealth: Payer: Self-pay | Admitting: Cardiology

## 2015-09-25 ENCOUNTER — Encounter: Payer: Self-pay | Admitting: Cardiology

## 2015-09-25 VITALS — BP 130/60 | HR 65 | Ht 68.0 in | Wt 273.0 lb

## 2015-09-25 DIAGNOSIS — I4892 Unspecified atrial flutter: Secondary | ICD-10-CM

## 2015-09-25 DIAGNOSIS — I2510 Atherosclerotic heart disease of native coronary artery without angina pectoris: Secondary | ICD-10-CM

## 2015-09-25 DIAGNOSIS — I10 Essential (primary) hypertension: Secondary | ICD-10-CM

## 2015-09-25 DIAGNOSIS — I5031 Acute diastolic (congestive) heart failure: Secondary | ICD-10-CM

## 2015-09-25 MED ORDER — ATORVASTATIN CALCIUM 80 MG PO TABS: 80.0000 mg | ORAL_TABLET | Freq: Every evening | ORAL | Status: DC

## 2015-09-25 MED ORDER — LOSARTAN POTASSIUM 25 MG PO TABS: 25.0000 mg | ORAL_TABLET | Freq: Every day | ORAL | Status: DC

## 2015-09-25 MED ORDER — SPIRONOLACTONE 25 MG PO TABS: 25.0000 mg | ORAL_TABLET | Freq: Every day | ORAL | Status: DC

## 2015-09-25 MED ORDER — FUROSEMIDE 40 MG PO TABS: 40.0000 mg | ORAL_TABLET | Freq: Every day | ORAL | Status: DC

## 2015-09-25 NOTE — Assessment & Plan Note (Signed)
Patient euvolemic on examination. Continue present dose of diuretics.

## 2015-09-25 NOTE — Assessment & Plan Note (Signed)
Continue statin. 

## 2015-09-25 NOTE — Telephone Encounter (Signed)
Patient was here today to see Dr. Stanford Breed and has more questions.

## 2015-09-25 NOTE — Assessment & Plan Note (Signed)
Patient remains in sinus rhythm.Given history of CVA I would not be comfortable not anticoagulating the patient. We will therefore not refer for ablation. Continue beta blocker and apixaban. 

## 2015-09-25 NOTE — Progress Notes (Signed)
HPI: FU coronary artery disease, hypertension, and hyperlipidemia. Cath 7/15 with severe CAD and EF 50. He underwent bypass grafting x2 with LIMA to the LAD and vein graft to the first obtuse marginal branch of the left circumflex. Abd ultrasound 9/15 showed no aneurysm. Echo 1/16 showed EF 40-45, severe LAE, moderate to severe RAE, mild RVE, moderate reduction in RV function and mild MR. Admitted 1/16 with a flutter and new CVA. Treated with apixaban. Carotid dopplers 8/16 showed 40-59 right and 1-39 left. FU recommended 2 years. Since last seen, the patient has dyspnea with more extreme activities but not with routine activities. It is relieved with rest. It is not associated with chest pain. There is no orthopnea, PND or pedal edema. There is no syncope or palpitations. There is no exertional chest pain.   Current Outpatient Prescriptions  Medication Sig Dispense Refill  . acetaminophen (TYLENOL) 500 MG tablet Take 1,000 mg by mouth at bedtime as needed for mild pain.     Marland Kitchen apixaban (ELIQUIS) 5 MG TABS tablet Take 1 tablet (5 mg total) by mouth 2 (two) times daily. 180 tablet 3  . atorvastatin (LIPITOR) 80 MG tablet Take 1 tablet (80 mg total) by mouth every evening. 90 tablet 0  . CALCIUM-MAGNESIUM-ZINC PO Take 1 each by mouth daily.    . Cholecalciferol (VITAMIN D) 2000 UNITS tablet Take 4,000 Units by mouth daily.     . Coenzyme Q10 (CO Q-10) 100 MG CAPS Take 1 capsule by mouth daily.    . fluticasone (FLONASE) 50 MCG/ACT nasal spray USE 2 SPRAYS NASALLY DAILY 48 g 0  . furosemide (LASIX) 40 MG tablet Take 1 tablet (40 mg total) by mouth daily. 90 tablet 0  . IRON PO Take 25 mg by mouth daily.    Marland Kitchen loratadine (CLARITIN) 10 MG tablet Take 10 mg by mouth daily.     Marland Kitchen losartan (COZAAR) 25 MG tablet Take 1 tablet (25 mg total) by mouth daily. 90 tablet 0  . metoprolol tartrate (LOPRESSOR) 25 MG tablet TAKE 1 TABLET TWICE A DAY 60 tablet 0  . Multiple Vitamin (MULTIVITAMIN) tablet Take 1  tablet by mouth daily. Centrum Silver    . nitroGLYCERIN (NITROSTAT) 0.4 MG SL tablet Place 1 tablet (0.4 mg total) under the tongue every 5 (five) minutes as needed for chest pain. 25 tablet 3  . omeprazole (PRILOSEC) 20 MG capsule Take 20 mg by mouth every other day.     . spironolactone (ALDACTONE) 25 MG tablet Take 1 tablet (25 mg total) by mouth daily. 90 tablet 0  . testosterone cypionate (DEPOTESTOTERONE CYPIONATE) 200 MG/ML injection Inject 200 mg into the muscle every 14 (fourteen) days.      No current facility-administered medications for this visit.     Past Medical History  Diagnosis Date  . CAD (coronary artery disease)   . HTN (hypertension)   . Prostatitis   . Hyperlipidemia   . Obesity   . Diverticular disease   . Gallstones   . Hepatitis age 7  . Arthritis   . Sleep apnea     mild, CPAP wasn't indicated (per pt)--2014 mod-severe, on CPAP since 07/2013  . Benign prostatic hypertrophy     followed by Dr. Gaynelle Arabian  . Cerebrovascular disease   . Sepsis due to Escherichia coli [facility]) ? 2011    DUE TO URINARY OUTLET OBSTRUCTION /INFECTION  . UTI (urinary tract infection)     secondary  . Allergy  RHINITIS  . Myocardial infarction [facility]) '98, '09    (Dr. Stanford Breed); s/p stents  . GERD (gastroesophageal reflux disease)   . Hypogonadism male     treated by Dr. Gaynelle Arabian  . Erectile dysfunction   . Peripheral vascular disease ([place])     02/28/12 MOST RECENT CAROTID DUPLEX--"STABLE MILD CALCIFIED PLAQUE, BILATERALY, STABLE, OVER SERIAL EXAMS"  . 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 IFG (impaired fasting glucose)   . Pneumonia     hx of  . 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 Diverticulosis     seen on colonoscopy (9/05, 06/2009)  . Hyperplastic colon polyp 05/2004  . Stroke [facility])     Past Surgical History  Procedure Laterality Date  . Total knee arthroplasty  L 4/06, R 4/07  . Vasectomy    .  Tonsillectomy    . Transurethral resection of prostate  06/2010  . Knee surgery  05/2010    removing scar tissue R knee (Dr. Wynelle Link)  . Coronary angioplasty with stent placement  01/1997    PTCA/stent to RCA  AND 2009 2ND STENT PLACED  . Joint replacement      both knees  . Total knee revision  09/23/2012    Procedure: TOTAL KNEE REVISION;  Surgeon: Gearlean Alf, MD;  Location: [facility];  Service: Orthopedics;  Laterality: Right;  . Coronary artery bypass graft N/A 04/15/2014    Procedure: CORONARY ARTERY BYPASS GRAFTING TIMES TWO USEING LEFT INTERNAL MAMMARY ARTERY AND RIGHT GREATER SAPHENOUS VEIN VIA ENDOVEIN HARVEST;  Surgeon: Rexene Alberts, MD;  Location: [place];  Service: Open Heart Surgery;  Laterality: N/A;  . Intraoperative transesophageal echocardiogram N/A 04/15/2014    Procedure: INTRAOPERATIVE TRANSESOPHAGEAL ECHOCARDIOGRAM;  Surgeon: Rexene Alberts, MD;  Location: [place];  Service: Open Heart Surgery;  Laterality: N/A;  . Left and right heart catheterization with coronary angiogram  04/13/2014    Procedure: LEFT AND RIGHT HEART CATHETERIZATION WITH CORONARY ANGIOGRAM;  Surgeon: Sinclair Grooms, MD;  Location: [facility] CATH LAB;  Service: Cardiovascular;;    Social History   Social History  . Marital Status: Married    Spouse Name: N/A  . Number of Children: 4  . Years of Education: N/A   Occupational History  . CPA    Social History Main Topics  . Smoking status: Former Smoker -- 1.50 packs/day for 30 years    Types: Cigarettes    Quit date: 09/16/1986  . Smokeless tobacco: Never Used     Comment: 35 pack year history, none in 18 years  . Alcohol Use: 0.0 oz/week    0 Standard drinks or equivalent per week     Comment: 1 glass of wine twice a week (previously daily)  . Drug Use: No  . Sexual Activity: Not Currently   Other Topics Concern  . Not on file   Social History Narrative   Lives with wife.  4 kids, 1 stepchild (1 in [place], 4 in [place]). No pets. Wife is a diabetic      ROS: no fevers or chills, productive cough, hemoptysis, dysphasia, odynophagia, melena, hematochezia, dysuria, hematuria, rash, seizure activity, orthopnea, PND, pedal edema, claudication. Remaining systems are negative.  Physical Exam: Well-developed obese in no acute distress.  Skin is warm and dry.  HEENT is normal.  Neck is supple.  Chest is clear to auscultation with normal  expansion.  Cardiovascular exam is regular rate and rhythm.  Abdominal exam nontender or distended. No masses palpated. Extremities show 1+ edema. neuro grossly intact

## 2015-09-25 NOTE — Patient Instructions (Addendum)
Your physician wants you to follow-up in: 6 MONTHS WITH DR CRENSHAW You will receive a reminder letter in the mail two months in advance. If you don't receive a letter, please call our office to schedule the follow-up appointment.   If you need a refill on your cardiac medications before your next appointment, please call your pharmacy.  

## 2015-09-25 NOTE — Telephone Encounter (Signed)
Spoke with pt, refill sent to the pharmacy. 

## 2015-09-25 NOTE — Assessment & Plan Note (Signed)
Continue statin.No aspirin given need for anticoagulation. 

## 2015-09-25 NOTE — Assessment & Plan Note (Signed)
Blood pressure controlled. Continue present medications. 

## 2015-09-25 NOTE — Assessment & Plan Note (Signed)
Follow-up carotid Dopplers August 2018. 

## 2015-10-06 ENCOUNTER — Telehealth: Payer: Self-pay | Admitting: Cardiology

## 2015-10-06 DIAGNOSIS — I4891 Unspecified atrial fibrillation: Secondary | ICD-10-CM

## 2015-10-06 NOTE — Telephone Encounter (Signed)
Pt here and EKG confirms atrial fib rate 87. bp 120/80. Pt denies chest pain, gets fatigued and SOB a little faster than usual. Discussed with dr Oval Linsey, no changes in medications made at this time. Follow up appt made with dr Stanford Breed for 11-14-15. Will make dr Stanford Breed aware.

## 2015-10-06 NOTE — Telephone Encounter (Signed)
Pt called in wanting to speak with Hilda Blades about his occasionally Atrial flutters that he would have while exercising . Please f/u with pt  Thanks

## 2015-10-06 NOTE — Telephone Encounter (Signed)
Spoke with pt, he thinks he has been out of rhythm all week. When he exercises his heart rate is from 80-140. He feels no fluttering or palpitations. Pt is going to come by the office for EKG to confirm rhythm.

## 2015-11-13 NOTE — Progress Notes (Signed)
HPI: FU coronary artery disease, hypertension, and hyperlipidemia. Cath 7/15 with severe CAD and EF 50. He underwent bypass grafting x2 with LIMA to the LAD and vein graft to the first obtuse marginal branch of the left circumflex. Abd ultrasound 9/15 showed no aneurysm. Echo 1/16 showed EF 40-45, severe LAE, moderate to severe RAE, mild RVE, moderate reduction in RV function and mild MR. Admitted 1/16 with a flutter and new CVA. Treated with apixaban. Carotid dopplers 8/16 showed 40-59 right and 1-39 left. FU recommended 2 years. Patient recently presented in January and electrocardiogram showed recurrent atrial fibrillation. Since last seen,   Current Outpatient Prescriptions  Medication Sig Dispense Refill  . acetaminophen (TYLENOL) 500 MG tablet Take 1,000 mg by mouth at bedtime as needed for mild pain.     Marland Kitchen apixaban (ELIQUIS) 5 MG TABS tablet Take 1 tablet (5 mg total) by mouth 2 (two) times daily. 180 tablet 3  . atorvastatin (LIPITOR) 80 MG tablet Take 1 tablet (80 mg total) by mouth every evening. 90 tablet 3  . CALCIUM-MAGNESIUM-ZINC PO Take 1 each by mouth daily.    . Cholecalciferol (VITAMIN D) 2000 UNITS tablet Take 4,000 Units by mouth daily.     . Coenzyme Q10 (CO Q-10) 100 MG CAPS Take 1 capsule by mouth daily.    . fluticasone (FLONASE) 50 MCG/ACT nasal spray USE 2 SPRAYS NASALLY DAILY 48 g 0  . furosemide (LASIX) 40 MG tablet Take 1 tablet (40 mg total) by mouth daily. 90 tablet 3  . IRON PO Take 25 mg by mouth daily.    Marland Kitchen loratadine (CLARITIN) 10 MG tablet Take 10 mg by mouth daily.     Marland Kitchen losartan (COZAAR) 25 MG tablet Take 1 tablet (25 mg total) by mouth daily. 90 tablet 3  . metoprolol tartrate (LOPRESSOR) 25 MG tablet Take 1 tablet (25 mg total) by mouth 2 (two) times daily. 180 tablet 3  . Multiple Vitamin (MULTIVITAMIN) tablet Take 1 tablet by mouth daily. Centrum Silver    . nitroGLYCERIN (NITROSTAT) 0.4 MG SL tablet Place 1 tablet (0.4 mg total) under the tongue  every 5 (five) minutes as needed for chest pain. 25 tablet 3  . omeprazole (PRILOSEC) 20 MG capsule Take 20 mg by mouth every other day.     . spironolactone (ALDACTONE) 25 MG tablet Take 1 tablet (25 mg total) by mouth daily. 90 tablet 3  . testosterone cypionate (DEPOTESTOTERONE CYPIONATE) 200 MG/ML injection Inject 200 mg into the muscle every 14 (fourteen) days.      No current facility-administered medications for this visit.     Past Medical History  Diagnosis Date  . CAD (coronary artery disease)   . HTN (hypertension)   . Prostatitis   . Hyperlipidemia   . Obesity   . Diverticular disease   . Gallstones   . Hepatitis age 32  . Arthritis   . Sleep apnea     mild, CPAP wasn't indicated (per pt)--2014 mod-severe, on CPAP since 07/2013  . Benign prostatic hypertrophy     followed by Dr. Gaynelle Arabian  . Cerebrovascular disease   . Sepsis due to Escherichia coli [facility]) ? 2011    DUE TO URINARY OUTLET OBSTRUCTION /INFECTION  . UTI (urinary tract infection)     secondary  . Allergy     RHINITIS  . Myocardial infarction [facility]) '98, '09    (Dr. Stanford Breed); s/p stents  . GERD (gastroesophageal reflux disease)   . Hypogonadism male  treated by Dr. Gaynelle Arabian  . Erectile dysfunction   . Peripheral vascular disease ([place])     02/28/12 MOST RECENT CAROTID DUPLEX--"STABLE MILD CALCIFIED PLAQUE, BILATERALY, STABLE, OVER SERIAL EXAMS"  . 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 IFG (impaired fasting glucose)   . Pneumonia     hx of  . 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 Diverticulosis     seen on colonoscopy (9/05, 06/2009)  . Hyperplastic colon polyp 05/2004  . Stroke [facility])     Past Surgical History  Procedure Laterality Date  . Total knee arthroplasty  L 4/06, R 4/07  . Vasectomy    . Tonsillectomy    . Transurethral resection of prostate  06/2010  . Knee surgery  05/2010    removing scar tissue R knee (Dr. Wynelle Link)  .  Coronary angioplasty with stent placement  01/1997    PTCA/stent to RCA  AND 2009 2ND STENT PLACED  . Joint replacement      both knees  . Total knee revision  09/23/2012    Procedure: TOTAL KNEE REVISION;  Surgeon: Gearlean Alf, MD;  [facility];  Service: Orthopedics;  Laterality: Right;  . Coronary artery bypass graft N/A 04/15/2014    Procedure: CORONARY ARTERY BYPASS GRAFTING TIMES TWO USEING LEFT INTERNAL MAMMARY ARTERY AND RIGHT GREATER SAPHENOUS VEIN VIA ENDOVEIN HARVEST;  Surgeon: Rexene Alberts, MD;  Location: [place];  Service: Open Heart Surgery;  Laterality: N/A;  . Intraoperative transesophageal echocardiogram N/A 04/15/2014    Procedure: INTRAOPERATIVE TRANSESOPHAGEAL ECHOCARDIOGRAM;  Surgeon: Rexene Alberts, MD;  Location: [place];  Service: Open Heart Surgery;  Laterality: N/A;  . Left and right heart catheterization with coronary angiogram  04/13/2014    Procedure: LEFT AND RIGHT HEART CATHETERIZATION WITH CORONARY ANGIOGRAM;  Surgeon: Sinclair Grooms, MD;  Location: [facility] CATH LAB;  Service: Cardiovascular;;    Social History   Social History  . Marital Status: Married    Spouse Name: N/A  . Number of Children: 4  . Years of Education: N/A   Occupational History  . CPA    Social History Main Topics  . Smoking status: Former Smoker -- 1.50 packs/day for 30 years    Types: Cigarettes    Quit date: 09/16/1986  . Smokeless tobacco: Never Used     Comment: 35 pack year history, none in 18 years  . Alcohol Use: 0.0 oz/week    0 Standard drinks or equivalent per week     Comment: 1 glass of wine twice a week (previously daily)  . Drug Use: No  . Sexual Activity: Not Currently   Other Topics Concern  . Not on file   Social History Narrative   Lives with wife.  4 kids, 1 stepchild (1 in [place], 4 in ). No pets. Wife is a diabetic    Family History  Problem Relation Age of Onset  . Coronary artery disease Mother     deceased  . Dementia Mother   . Atrial  fibrillation Mother   . Stroke Mother     related to atrial fib  . Coronary artery disease Father     deceased  . Hypertension Father   . Heart disease Father   . Cancer Paternal Uncle     colon; died in his 30's  . Colon cancer Paternal Uncle   . Hypertension  Paternal Grandmother   . Heart disease Paternal Grandmother   . Hypertension Paternal Grandfather   . Heart disease Paternal Grandfather   . Diabetes Neg Hx     ROS: no fevers or chills, productive cough, hemoptysis, dysphasia, odynophagia, melena, hematochezia, dysuria, hematuria, rash, seizure activity, orthopnea, PND, pedal edema, claudication. Remaining systems are negative.  Physical Exam: Well-developed obese in no acute distress.  Skin is warm and dry.  HEENT is normal.  Neck is supple.  Chest is clear to auscultation with normal expansion.  Cardiovascular exam is irregular Abdominal exam nontender or distended. No masses palpated. Extremities show no edema. neuro grossly intact  ECG Atrial fibrillation at a rate of 85. Occasional PVC or aberrantly conducted beats. Anterior and inferior infarct.

## 2015-11-14 ENCOUNTER — Encounter: Payer: Self-pay | Admitting: Cardiology

## 2015-11-14 ENCOUNTER — Ambulatory Visit (INDEPENDENT_AMBULATORY_CARE_PROVIDER_SITE_OTHER): Payer: 59 | Admitting: Cardiology

## 2015-11-14 VITALS — BP 126/80 | HR 85 | Ht 67.0 in | Wt 279.0 lb

## 2015-11-14 DIAGNOSIS — I4891 Unspecified atrial fibrillation: Secondary | ICD-10-CM

## 2015-11-14 DIAGNOSIS — I10 Essential (primary) hypertension: Secondary | ICD-10-CM

## 2015-11-14 DIAGNOSIS — I5031 Acute diastolic (congestive) heart failure: Secondary | ICD-10-CM

## 2015-11-14 DIAGNOSIS — E785 Hyperlipidemia, unspecified: Secondary | ICD-10-CM

## 2015-11-14 MED ORDER — APIXABAN 5 MG PO TABS: 5.0000 mg | ORAL_TABLET | Freq: Two times a day (BID) | ORAL | Status: DC

## 2015-11-14 MED ORDER — METOPROLOL TARTRATE 25 MG PO TABS: 25.0000 mg | ORAL_TABLET | Freq: Two times a day (BID) | ORAL | Status: DC

## 2015-11-14 NOTE — Assessment & Plan Note (Addendum)
Patient remains in atrial fibrillation. Continue metoprolol for rate control. Continue apixaban. We discussed at length options for treating his atrial fibrillation including rate control versus rhythm control. He appears to be symptomatic with increasing fatigue and dyspnea on exertion. I would therefore like to maintain sinus if possible. He has had recurrent atrial fibrillation intermittently by his report prior to his most recent episode which has been persistent. I therefore feel that he will require an antiarrhythmic to maintain sinus rhythm. Best option is likely tikosyn. I will refer to the atrial fibrillation clinic to arrange. We will arrange follow-up echocardiogram.

## 2015-11-14 NOTE — Assessment & Plan Note (Signed)
Euvolemic on examination.Continue present dose of Lasix. 

## 2015-11-14 NOTE — Assessment & Plan Note (Signed)
Continue statin.No aspirin given need for anticoagulation. 

## 2015-11-14 NOTE — Assessment & Plan Note (Signed)
Follow-up carotid Dopplers August 2017. 

## 2015-11-14 NOTE — Assessment & Plan Note (Signed)
Continue statin. 

## 2015-11-14 NOTE — Assessment & Plan Note (Signed)
Blood pressure controlled. Continue present medications. 

## 2015-11-14 NOTE — Patient Instructions (Signed)
Your physician recommends that you schedule a follow-up appointment in: 3 MONTHS WITH DR [place] [facility]-11-22-15 @ 11:30 AM = [phone] [id number] PARKING CODE=[id number]

## 2015-11-17 HISTORY — PX: CYSTOSCOPY: SUR368

## 2015-11-21 ENCOUNTER — Other Ambulatory Visit: Payer: Self-pay | Admitting: Urology

## 2015-11-22 ENCOUNTER — Encounter: Payer: Self-pay | Admitting: *Deleted

## 2015-11-22 ENCOUNTER — Encounter: Payer: Self-pay | Admitting: Family Medicine

## 2015-11-22 ENCOUNTER — Ambulatory Visit (HOSPITAL_COMMUNITY)
Admission: RE | Admit: 2015-11-22 | Discharge: 2015-11-22 | Disposition: A | Discharge status: Home / Self Care | Payer: 59 | Source: Ambulatory Visit | Attending: Nurse Practitioner | Admitting: Nurse Practitioner

## 2015-11-22 VITALS — BP 130/86 | HR 74 | Ht 67.0 in | Wt 279.0 lb

## 2015-11-22 DIAGNOSIS — I10 Essential (primary) hypertension: Secondary | ICD-10-CM | POA: Insufficient documentation

## 2015-11-22 DIAGNOSIS — G4730 Sleep apnea, unspecified: Secondary | ICD-10-CM | POA: Insufficient documentation

## 2015-11-22 DIAGNOSIS — H9193 Unspecified hearing loss, bilateral: Secondary | ICD-10-CM | POA: Insufficient documentation

## 2015-11-22 DIAGNOSIS — Z8673 Personal history of transient ischemic attack (TIA), and cerebral infarction without residual deficits: Secondary | ICD-10-CM | POA: Insufficient documentation

## 2015-11-22 DIAGNOSIS — I4891 Unspecified atrial fibrillation: Secondary | ICD-10-CM

## 2015-11-22 DIAGNOSIS — K219 Gastro-esophageal reflux disease without esophagitis: Secondary | ICD-10-CM | POA: Diagnosis not present

## 2015-11-22 DIAGNOSIS — Z951 Presence of aortocoronary bypass graft: Secondary | ICD-10-CM | POA: Diagnosis not present

## 2015-11-22 DIAGNOSIS — I252 Old myocardial infarction: Secondary | ICD-10-CM | POA: Insufficient documentation

## 2015-11-22 DIAGNOSIS — E291 Testicular hypofunction: Secondary | ICD-10-CM | POA: Diagnosis not present

## 2015-11-22 DIAGNOSIS — Z79899 Other long term (current) drug therapy: Secondary | ICD-10-CM | POA: Insufficient documentation

## 2015-11-22 DIAGNOSIS — Z8249 Family history of ischemic heart disease and other diseases of the circulatory system: Secondary | ICD-10-CM | POA: Insufficient documentation

## 2015-11-22 DIAGNOSIS — Z7901 Long term (current) use of anticoagulants: Secondary | ICD-10-CM | POA: Diagnosis not present

## 2015-11-22 DIAGNOSIS — I2510 Atherosclerotic heart disease of native coronary artery without angina pectoris: Secondary | ICD-10-CM | POA: Insufficient documentation

## 2015-11-22 DIAGNOSIS — E669 Obesity, unspecified: Secondary | ICD-10-CM | POA: Diagnosis not present

## 2015-11-22 DIAGNOSIS — Z823 Family history of stroke: Secondary | ICD-10-CM | POA: Insufficient documentation

## 2015-11-22 DIAGNOSIS — I481 Persistent atrial fibrillation: Secondary | ICD-10-CM | POA: Diagnosis not present

## 2015-11-22 DIAGNOSIS — E785 Hyperlipidemia, unspecified: Secondary | ICD-10-CM | POA: Insufficient documentation

## 2015-11-22 DIAGNOSIS — Z6841 Body Mass Index (BMI) 40.0 and over, adult: Secondary | ICD-10-CM | POA: Diagnosis not present

## 2015-11-22 DIAGNOSIS — Z87891 Personal history of nicotine dependence: Secondary | ICD-10-CM | POA: Insufficient documentation

## 2015-11-22 NOTE — Addendum Note (Signed)
Encounter addended by: Sherran Needs, NP on: 11/22/2015  3:30 PM<BR>     Documentation filed: Follow-up Section, LOS Section

## 2015-11-22 NOTE — Progress Notes (Addendum)
Patient ID: Jeffrey Wiggins, male   DOB: [birth year], 75 y.o.   MRN: [id number]     Primary Care Physician: [name], MD Referring Physician: Dr. Aubery Lapping is a 75 y.o. male with a h/o CAD, s/p bypass, PAF since 1/16 at which time he was diagnsed with a stroke. He states he spontaneously converted to SR while in the hospital. Since that time has been paroxysmal, until the last 6 weeks, he has been in afib persistently. Does notice some increase in fatigue and shortness of breath while in afib.    He does not drink excessive alcohol, minimal use of caffeine, positive for sleep apnea, compliant with cpap, exercises 5 x a week. BP controlled. Chadsvasc score is at least 5, on apixaban. Is obese. BP stable. Echo shows reduced EF at 40-45% with left atrium severely dilated at 56 mm, performed 1/16. He was referred by Dr. Stanford Breed to discuss returning to [place] with tikosyn therapy.  Today, he denies symptoms of palpitations, chest pain, shortness of breath, orthopnea, PND, lower extremity edema, dizziness, presyncope, syncope, or neurologic sequela. The patient is tolerating medications without difficulties and is otherwise without complaint today.   Past Medical History  Diagnosis Date  . CAD (coronary artery disease)   . HTN (hypertension)   . Prostatitis   . Hyperlipidemia   . Obesity   . Diverticular disease   . Gallstones   . Hepatitis age 24  . Arthritis   . Sleep apnea     mild, CPAP wasn't indicated (per pt)--2014 mod-severe, on CPAP since 07/2013  . Benign prostatic hypertrophy     followed by Dr. Gaynelle Arabian; regrowth R lat lob (11/2015)  . Cerebrovascular disease   . Sepsis due to Escherichia coli [facility]) ? 2011    DUE TO URINARY OUTLET OBSTRUCTION /INFECTION  . UTI (urinary tract infection)     secondary  . Allergy     RHINITIS  . Myocardial infarction [facility]) '98, '09    (Dr. Stanford Breed); s/p stents  . GERD (gastroesophageal reflux disease)   . Hypogonadism male     treated by Dr. Gaynelle Arabian  . Erectile dysfunction   . Peripheral vascular disease ([place])     02/28/12 MOST RECENT CAROTID DUPLEX--"STABLE MILD CALCIFIED PLAQUE, BILATERALY, STABLE, OVER SERIAL EXAMS"  . 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 IFG (impaired fasting glucose)   . Pneumonia     hx of  . 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 Diverticulosis     seen on colonoscopy (9/05, 06/2009)  . Hyperplastic colon polyp 05/2004  . Stroke [facility])    Past Surgical History  Procedure Laterality Date  . Total knee arthroplasty  L 4/06, R 4/07  . Vasectomy    . Tonsillectomy    . Transurethral resection of prostate  06/2010  . Knee surgery  05/2010    removing scar tissue R knee (Dr. Wynelle Link)  . Coronary angioplasty with stent placement  01/1997    PTCA/stent to RCA  AND 2009 2ND STENT PLACED  . Joint replacement      both knees  . Total knee revision  09/23/2012    Procedure: TOTAL KNEE REVISION;  Surgeon: Gearlean Alf, MD;  Location: [facility];  Service: Orthopedics;  Laterality: Right;  . Coronary artery bypass graft N/A 04/15/2014    Procedure: CORONARY ARTERY BYPASS GRAFTING TIMES  TWO USEING LEFT INTERNAL MAMMARY ARTERY AND RIGHT GREATER SAPHENOUS VEIN VIA ENDOVEIN HARVEST;  Surgeon: Rexene Alberts, MD;  Location: [place];  Service: Open Heart Surgery;  Laterality: N/A;  . Intraoperative transesophageal echocardiogram N/A 04/15/2014    Procedure: INTRAOPERATIVE TRANSESOPHAGEAL ECHOCARDIOGRAM;  Surgeon: Rexene Alberts, MD;  Location: [place];  Service: Open Heart Surgery;  Laterality: N/A;  . Left and right heart catheterization with coronary angiogram  04/13/2014    Procedure: LEFT AND RIGHT HEART CATHETERIZATION WITH CORONARY ANGIOGRAM;  Surgeon: Sinclair Grooms, MD;  Location: [facility] CATH LAB;  Service: Cardiovascular;;    Current Outpatient Prescriptions  Medication Sig Dispense Refill  . acetaminophen (TYLENOL) 500 MG tablet Take 1,000 mg by  mouth at bedtime as needed for mild pain.     Marland Kitchen apixaban (ELIQUIS) 5 MG TABS tablet Take 1 tablet (5 mg total) by mouth 2 (two) times daily. 180 tablet 3  . atorvastatin (LIPITOR) 80 MG tablet Take 1 tablet (80 mg total) by mouth every evening. 90 tablet 3  . CALCIUM-MAGNESIUM-ZINC PO Take 1 each by mouth daily.    . Cholecalciferol (VITAMIN D) 2000 UNITS tablet Take 4,000 Units by mouth daily.     . Coenzyme Q10 (CO Q-10) 100 MG CAPS Take 1 capsule by mouth daily.    . fluticasone (FLONASE) 50 MCG/ACT nasal spray USE 2 SPRAYS NASALLY DAILY 48 g 0  . furosemide (LASIX) 40 MG tablet Take 1 tablet (40 mg total) by mouth daily. 90 tablet 3  . IRON PO Take 25 mg by mouth daily.    Marland Kitchen loratadine (CLARITIN) 10 MG tablet Take 10 mg by mouth daily.     Marland Kitchen losartan (COZAAR) 25 MG tablet Take 1 tablet (25 mg total) by mouth daily. 90 tablet 3  . metoprolol tartrate (LOPRESSOR) 25 MG tablet Take 1 tablet (25 mg total) by mouth 2 (two) times daily. 180 tablet 3  . Multiple Vitamin (MULTIVITAMIN) tablet Take 1 tablet by mouth daily. Centrum Silver    . omeprazole (PRILOSEC) 20 MG capsule Take 20 mg by mouth every other day.     . spironolactone (ALDACTONE) 25 MG tablet Take 1 tablet (25 mg total) by mouth daily. 90 tablet 3  . testosterone cypionate (DEPOTESTOTERONE CYPIONATE) 200 MG/ML injection Inject 200 mg into the muscle every 14 (fourteen) days.     . nitroGLYCERIN (NITROSTAT) 0.4 MG SL tablet Place 1 tablet (0.4 mg total) under the tongue every 5 (five) minutes as needed for chest pain. (Patient not taking: Reported on 11/22/2015) 25 tablet 3   No current facility-administered medications for this encounter.    No Known Allergies  Social History   Social History  . Marital Status: Married    Spouse Name: N/A  . Number of Children: 4  . Years of Education: N/A   Occupational History  . CPA    Social History Main Topics  . Smoking status: Former Smoker -- 1.50 packs/day for 30 years    Types:  Cigarettes    Quit date: 09/16/1986  . Smokeless tobacco: Never Used     Comment: 35 pack year history, none in 18 years  . Alcohol Use: 0.0 oz/week    0 Standard drinks or equivalent per week     Comment: 1 glass of wine twice a week (previously daily)  . Drug Use: No  . Sexual Activity: Not Currently   Other Topics Concern  . Not on file   Social History Narrative  Lives with wife.  4 kids, 1 stepchild (1 in [place], 4 in [place]). No pets. Wife is a diabetic    Family History  Problem Relation Age of Onset  . Coronary artery disease Mother     deceased  . Dementia Mother   . Atrial fibrillation Mother   . Stroke Mother     related to atrial fib  . Coronary artery disease Father     deceased  . Hypertension Father   . Heart disease Father   . Cancer Paternal Uncle     colon; died in his 80's  . Colon cancer Paternal Uncle   . Hypertension Paternal Grandmother   . Heart disease Paternal Grandmother   . Hypertension Paternal Grandfather   . Heart disease Paternal Grandfather   . Diabetes Neg Hx     ROS- All systems are reviewed and negative except as per the HPI above  Physical Exam: Filed Vitals:   11/22/15 1127  BP: 130/86  Pulse: 74  Height: 5\' 7"  (1.702 m)  Weight: 279 lb (126.554 kg)    GEN- The patient is well appearing, alert and oriented x 3 today.   Head- normocephalic, atraumatic Eyes-  Sclera clear, conjunctiva pink Ears- hearing intact Oropharynx- clear Neck- supple, no JVP Lymph- no cervical lymphadenopathy Lungs- Clear to ausculation bilaterally, normal work of breathing Heart- irregular rate and rhythm, no murmurs, rubs or gallops, PMI not laterally displaced GI- soft, NT, ND, + BS Extremities- no clubbing, cyanosis, or edema MS- no significant deformity or atrophy Skin- no rash or lesion Psych- euthymic mood, full affect Neuro- strength and sensation are intact  EKG- afib at 74 bpm, qrs int 88 ms, qtc 401 ms Epic records reviewed  Assessment  and Plan: 1. 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 I am worried about our ability to keep him in rhythm long term with such a  enlarged left atrium, from echo over one year ago. I also believe that he would benefit from being in [place] with his h/o reduced ef. Continue apixaban Continue metoprolol  2. Sleep apnea Continue cpap  3. Obesity Congratulated on his regular exercise program Encouraged weight loss  4. HTN  Stable  5. CAD S/p bypass Stable  6. Prior cva Continue xarelto   I will f/u with patient after results of echo reviewed.  Will repeat echo and then review with Dr. Rayann Heman and formulate plan.  I will discuss with pt further   Geroge Baseman. Delma Villalva, [facility] [address] [place],  [id number] [phone]

## 2015-11-24 ENCOUNTER — Ambulatory Visit (HOSPITAL_COMMUNITY)
Admission: RE | Admit: 2015-11-24 | Discharge: 2015-11-24 | Disposition: A | Discharge status: Home / Self Care | Payer: 59 | Source: Ambulatory Visit | Attending: Nurse Practitioner | Admitting: Nurse Practitioner

## 2015-11-24 ENCOUNTER — Other Ambulatory Visit: Payer: Self-pay | Admitting: *Deleted

## 2015-11-24 DIAGNOSIS — I517 Cardiomegaly: Secondary | ICD-10-CM | POA: Insufficient documentation

## 2015-11-24 DIAGNOSIS — I4891 Unspecified atrial fibrillation: Secondary | ICD-10-CM | POA: Diagnosis present

## 2015-11-24 DIAGNOSIS — I340 Nonrheumatic mitral (valve) insufficiency: Secondary | ICD-10-CM | POA: Insufficient documentation

## 2015-11-24 NOTE — Progress Notes (Signed)
  Echocardiogram 2D Echocardiogram has been performed.  Jeffrey Wiggins 11/24/2015, 1:08 PM

## 2015-11-28 ENCOUNTER — Telehealth: Payer: Self-pay | Admitting: *Deleted

## 2015-11-28 NOTE — Telephone Encounter (Signed)
Will forward this note to the number provided. 

## 2015-11-28 NOTE — Telephone Encounter (Signed)
Pt needs clearance for GREENLIGHT LASER OF PROSTATE AND BLADDER NECK with dr Gaynelle Arabian. Per dr Gaynelle Arabian, pt to remain on eliquis. Will forward for dr Stanford Breed review

## 2015-11-28 NOTE — Telephone Encounter (Signed)
Ok for surgery Jeffrey Wiggins  

## 2015-12-12 ENCOUNTER — Telehealth: Payer: Self-pay | Admitting: Pharmacist

## 2015-12-12 NOTE — Telephone Encounter (Addendum)
Spoke with pt regarding upcoming Tikosyn initiation. Not on any contraindicated medications, most recent labwork ok. Pt prefers to start Tikosyn in mid May after prostate surgery. Documentation from 3/14 shows that Dr. Gaynelle Arabian wants pt to stay on Eliquis for the procedure. Advised pt that he needs 3 weeks of uninterrupted anticoag therapy prior to Eliquis - he will call if there are any complications from his prostate procedure that require him to hold his Eliquis. Since no interruption of anticoag is expected, have scheduled Tikosyn initiation for 5/15 per patient preference.

## 2016-01-04 NOTE — Progress Notes (Signed)
LOV DR CRENSHAW 11-14-15 EPIC EKG 11-22-15 EPIC ECHO 11-15-15 EPIC CAROTID DUPLEX 04-18-15 EPIC

## 2016-01-04 NOTE — Patient Instructions (Addendum)
Jeffrey Wiggins  01/04/2016   Your procedure is scheduled on: 01-15-16  Report to [facility] Main  Entrance take [facility]  elevators to 3rd floor to  [place] at 700 AM.  Call this number if you have problems the morning of surgery [phone]   Remember: ONLY 1 PERSON MAY GO WITH YOU TO SHORT STAY TO GET  READY MORNING OF [place].  Do not eat food or drink liquids :After Midnight.  Bring cpap mask and tubing   Take these medicines the morning of surgery with A SIP OF WATER: FOLLOW DR Gaynelle Arabian INSTRUCTIONS REGARDING ELIQUIS, METOPROLOL TARTRATE,                               You may not have any metal on your body including hair pins and              piercings  Do not wear jewelry, make-up, lotions, powders or perfumes, deodorant             Do not wear nail polish.  Do not shave  48 hours prior to surgery.              Men may shave face and neck.   Do not bring valuables to the hospital. [place].  Contacts, dentures or bridgework may not be worn into surgery.  Leave suitcase in the car. After surgery it may be brought to your room.     Patients discharged the day of surgery will not be allowed to drive home.  Name and phone number of your driver:wife patsy cell [phone]  Special Instructions: N/A              Please read over the following fact sheets you were given: _____________________________________________________________________             [facility] - Preparing for Surgery Before surgery, you can play an important role.  Because skin is not sterile, your skin needs to be as free of germs as possible.  You can reduce the number of germs on your skin by washing with CHG (chlorahexidine gluconate) soap before surgery.  CHG is an antiseptic cleaner which kills germs and bonds with the skin to continue killing germs even after washing. Please DO NOT use if you have an allergy to CHG or  antibacterial soaps.  If your skin becomes reddened/irritated stop using the CHG and inform your nurse when you arrive at Short Stay. Do not shave (including legs and underarms) for at least 48 hours prior to the first CHG shower.  You may shave your face/neck. Please follow these instructions carefully:  1.  Shower with CHG Soap the night before surgery and the  morning of Surgery.  2.  If you choose to wash your hair, wash your hair first as usual with your  normal  shampoo.  3.  After you shampoo, rinse your hair and body thoroughly to remove the  shampoo.                           4.  Use CHG as you would any other liquid soap.  You can apply chg directly  to the skin  and wash                       Gently with a scrungie or clean washcloth.  5.  Apply the CHG Soap to your body ONLY FROM THE NECK DOWN.   Do not use on face/ open                           Wound or open sores. Avoid contact with eyes, ears mouth and genitals (private parts).                       Wash face,  Genitals (private parts) with your normal soap.             6.  Wash thoroughly, paying special attention to the area where your surgery  will be performed.  7.  Thoroughly rinse your body with warm water from the neck down.  8.  DO NOT shower/wash with your normal soap after using and rinsing off  the CHG Soap.                9.  Pat yourself dry with a clean towel.            10.  Wear clean pajamas.            11.  Place clean sheets on your bed the night of your first shower and do not  sleep with pets. Day of Surgery : Do not apply any lotions/deodorants the morning of surgery.  Please wear clean clothes to the hospital/surgery center.  FAILURE TO FOLLOW THESE INSTRUCTIONS MAY RESULT IN THE CANCELLATION OF YOUR SURGERY PATIENT SIGNATURE_________________________________  NURSE SIGNATURE__________________________________  ________________________________________________________________________

## 2016-01-10 ENCOUNTER — Encounter (HOSPITAL_COMMUNITY)
Admission: RE | Admit: 2016-01-10 | Discharge: 2016-01-10 | Disposition: A | Discharge status: Home / Self Care | Payer: 59 | Source: Ambulatory Visit | Attending: Urology | Admitting: Urology

## 2016-01-10 ENCOUNTER — Encounter (HOSPITAL_COMMUNITY): Payer: Self-pay

## 2016-01-10 DIAGNOSIS — Z01812 Encounter for preprocedural laboratory examination: Secondary | ICD-10-CM | POA: Diagnosis not present

## 2016-01-10 HISTORY — DX: Unspecified cataract: H26.9

## 2016-01-10 HISTORY — DX: Reserved for inherently not codable concepts without codable children: IMO0001

## 2016-01-10 HISTORY — DX: Nocturia: R35.1

## 2016-01-10 HISTORY — DX: Family history of other specified conditions: Z84.89

## 2016-01-10 LAB — COMPREHENSIVE METABOLIC PANEL
ALT: 36 U/L (ref 17–63)
AST: 32 U/L (ref 15–41)
Albumin: 4.2 g/dL (ref 3.5–5.0)
Alkaline Phosphatase: 40 U/L (ref 38–126)
Anion gap: 7 (ref 5–15)
BILIRUBIN TOTAL: 1.1 mg/dL (ref 0.3–1.2)
BUN: 15 mg/dL (ref 6–20)
CO2: 27 mmol/L (ref 22–32)
CREATININE: 1.01 mg/dL (ref 0.61–1.24)
Calcium: 9.3 mg/dL (ref 8.9–10.3)
Chloride: 104 mmol/L (ref 101–111)
GFR calc Af Amer: >60 mL/min (ref >60)
Glucose, Bld: 86 mg/dL (ref 65–99)
POTASSIUM: 4.9 mmol/L (ref 3.5–5.1)
Sodium: 138 mmol/L (ref 135–145)
Total Protein: 7.4 g/dL (ref 6.5–8.1)

## 2016-01-10 LAB — CBC
HEMATOCRIT: 52.9 % — AB (ref 39.0–52.0)
Hemoglobin: 16.2 g/dL (ref 13.0–17.0)
MCH: 25.4 pg — ABNORMAL LOW (ref 26.0–34.0)
MCHC: 30.6 g/dL (ref 30.0–36.0)
MCV: 82.8 fL (ref 78.0–100.0)
Platelets: 213 10*3/uL (ref 150–400)
RBC: 6.39 MIL/uL — ABNORMAL HIGH (ref 4.22–5.81)
RDW: 19.1 % — AB (ref 11.5–15.5)
WBC: 10.1 10*3/uL (ref 4.0–10.5)

## 2016-01-13 NOTE — H&P (Signed)
Reason For Visit Cystoscopy, flowrate & PVR   Active Problems Problems  1. Benign prostatic hyperplasia with urinary obstruction (N40.1,N13.8)   Assessed By: Carolan Clines (Urology); Last Assessed: 17 Nov 2015 2. 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 Hypogonadism, testicular (E29.1) 4. Increased urinary frequency (R35.0) 5. Nocturia (R35.1) 6. 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 Urinary urgency (R39.15)   Assessed By: Carolan Clines (Urology); Last Assessed: 17 Nov 2015  History of Present Illness    75 yo male returns today for a cystoscopy, flowrate & PVR for hx of LUTS, looking for regrowth of prostate tissue. Hx of BPH, ED, hypogonadism (on Testosterone inj 200mg  q other week) & Peyronie's disease. He takes Lasix in the AM, and notes that his stream is not as strong. 3 weeks ago, he noted the sudden onset of poor voiding, with slow stream without dribbling. His stream has not improved. He has nocturia x 2, with definite slowing of his stream. IPSS= 9 today.     (1). Hypogonadism: Peak T= 682. Pt awakens with erection. he is able to masturbate.   (2) He has use the EDEX injection once since his last OV, but it doesn't really work.   (3) BPH) He is postop HoLap on 07-23-10, for 59.63cc prostate (was 104 prior to Avodart).  He has slight reduction in flow, but otherwise voids well. IPSS= 6 only.    (4) he is post CABG in July, 2015.   02/2015 Testosterone 682 ( peak)   02/22/14 Testosterone - 405 (trough)  02/15/14 Testosterone - 333 (peak)  02/08/14 Testosterone - 156 (trough)  01/31/14 Testosterone - 246 (peak)  12/27/13 labs: Testosterone - 112, Hct - 39.3% and Hgb - 13.3  08/23/13 labs: Testosterone - 169, PSA - 1.09, Hct - 41.2% and Hgb - 13.8  08/24/12 Testosterone - 290.28  08/20/11 Testosterone - 166.88  01/28/11 Testosterone - 442.03   Past Medical History Problems  1. History of Acute Myocardial Infarction 2. History of  Arthritis 3. History of Coronary Artery Disease 4. History of hepatitis (Z86.19) 5. History of hypercholesterolemia (Z86.39) 6. History of hypertension (Z86.79) 7. History of sleep apnea (Z86.69) 8. History of stroke [id number])  Surgical History Problems  1. History of CABG 2. History of Knee Replacement 3. History of Surgery Vas Deferens Vasectomy 4. History of Tonsillectomy 5. History of Transurethral Resection Of Prostate (TURP)  Current Meds 1. Atorvastatin Calcium 80 MG Oral Tablet;  Therapy: (Recorded:09Dec2013) to Recorded 2. Calcium-Magnesium TABS;  Therapy: (Recorded:03Mar2017) to Recorded 3. CPAP;  Therapy: (Recorded:15Dec2014) to Recorded 4. Eliquis 5 MG Oral Tablet;  Therapy: (Recorded:03Mar2017) to Recorded 5. Flonase SUSP;  Therapy: (Recorded:25Jan2008) to Recorded 6. Furosemide 40 MG Oral Tablet;  Therapy: (Recorded:08Jul2016) to Recorded 7. Iron TABS;  Therapy: (Recorded:03Mar2017) to Recorded 8. Losartan Potassium 25 MG Oral Tablet;  Therapy: (Recorded:03Mar2017) to Recorded 9. Metoprolol Tartrate 25 MG Oral Tablet;  Therapy: (Recorded:03Mar2017) to Recorded 10. Multiple Vitamin TABS;   Therapy: (Recorded:03Mar2017) to Recorded 11. Nitroglycerin 0.4 MG Sublingual Tablet Sublingual;   Therapy: (Recorded:03Mar2017) to Recorded 12. PriLOSEC OTC 20 MG Oral Tablet Delayed Release;   Therapy: (Recorded:11Jun2015) to Recorded 13. Spironolactone 25 MG Oral Tablet;   Therapy: (Recorded:03Mar2017) to Recorded 14. Testosterone Cypionate 200 MG/ML Intramuscular Solution; INJECT 1 ML   INTRAMUSCULARLY EVERY 2 WEEKS;   Therapy: 29Apr2015 to (Evaluate:29Mar2017)  Requested for: 30Sep2016; Last   Rx:30Sep2016 Ordered 15. Tylenol TABS;   Therapy: (Recorded:11Jun2015) to Recorded 16. Vitamin D3 2000 UNIT Oral Tablet;   Therapy: (Recorded:09Dec2013) to  Recorded  Allergies Medication  1. No Known Drug Allergies  Family History Problems  1. Family history of Family  Health Status Number Of Children   2 sons, 2 daughters 2. No pertinent family history : Mother  Social History Problems  1. Alcohol Use (History)   1 glass/beer per day 2. Caffeine Use   4-5 glasses per day 3. Family history of Death In The Family Father   Age 57 4. Family history of Death In The Family Mother   Age 29 5. Former smoker [phone]) 6. Marital History - Currently Married 7. Occupation:   C.P.A. 8. Tobacco Use   1 1/2 packs per day for 30 years; quit 18 years ago.  Review of Systems Genitourinary, constitutional, skin, eye, otolaryngeal, hematologic/lymphatic, cardiovascular, pulmonary, endocrine, musculoskeletal, gastrointestinal, neurological and psychiatric system(s) were reviewed and pertinent findings if present are noted and are otherwise negative.  Genitourinary: urinary frequency, feelings of urinary urgency, nocturia and weak urinary stream.    Vitals Vital Signs [Data Includes: Last 1 Day]  Recorded: [id number] 03:53PM  Blood Pressure: 127 / 80 Temperature: 98.9 F Heart Rate: 103  Physical Exam Constitutional: Well nourished and well developed . No acute distress.  ENT:. The ears and nose are normal in appearance.  Neck: The appearance of the neck is normal and no neck mass is present.  Pulmonary: No respiratory distress and normal respiratory rhythm and effort.  Cardiovascular: Heart rate and rhythm are normal . No peripheral edema.  Abdomen: The abdomen is mildly obese. The abdomen is soft and nontender. No masses are palpated. No CVA tenderness. No hernias are palpable. No hepatosplenomegaly noted.  Rectal: Rectal exam demonstrates absent sphincter tone. Estimated prostate size is 3+. Normal rectal tone, no rectal masses, prostate is smooth, symmetric and non-tender.  Genitourinary: Examination of the penis demonstrates no discharge, no masses, no lesions and a normal meatus. The scrotum is without lesions. The right epididymis is palpably normal  and non-tender. The left epididymis is palpably normal and non-tender. The right testis is non-tender and without masses. The left testis is non-tender and without masses.  Lymphatics: The femoral and inguinal nodes are not enlarged or tender.  Skin: Normal skin turgor, no visible rash and no visible skin lesions.  Neuro/Psych:. Mood and affect are appropriate.    Results/Data Urine [Data Includes: Last 1 Day]   [id number]  COLOR YELLOW   APPEARANCE CLEAR   SPECIFIC GRAVITY 1.025   pH 5.0   GLUCOSE NEGATIVE   BILIRUBIN NEGATIVE   KETONE NEGATIVE   BLOOD 1+   PROTEIN NEGATIVE   NITRITE NEGATIVE   LEUKOCYTE ESTERASE NEGATIVE   SQUAMOUS EPITHELIAL/HPF 0-5 HPF  WBC 0-5 WBC/HPF  RBC 3-10 RBC/HPF  BACTERIA NONE SEEN HPF  CRYSTALS NONE SEEN HPF  CASTS NONE SEEN LPF  Yeast NONE SEEN HPF   Flow Rate: Voided 363 ml. A peak flow rate of 25ml/s and mean flow rate of 35ml/s.  PVR: Ultrasound PVR 12.04 ml.    Procedure  Procedure: Cystoscopy  Chaperone Present: Hillary Bow, CMA.  Indication: Lower Urinary Tract Symptoms.  Informed Consent: Risks, benefits, and potential adverse events were discussed and informed consent was obtained from the patient.  Prep: The patient was prepped with betadine.  Anesthesia:. Local anesthesia was administered intraurethrally with 2% lidocaine jelly.  Antibiotic prophylaxis: Ciprofloxacin.  Procedure Note:  Urethral meatus:. No abnormalities.  Anterior urethra: No abnormalities.  Prostatic urethra: No abnormalities . There was visual obstruction of the prostatic urethra. The lateral  prostatic lobes were enlarged.  Bladder: Visulization was clear. The ureteral orifices were in the normal anatomic position bilaterally and had clear efflux of urine. A systematic survey of the bladder demonstrated no bladder tumors or stones. Examination of the bladder demonstrated trabeculation cellules. The patient tolerated the procedure well.  Complications: None.     Assessment Assessed  1. Benign prostatic hyperplasia with urinary obstruction (N40.1,N13.8) 2. Urinary urgency (R39.15)  Wendelyn Breslow Hasman is a 75 year old male, with a history of holmium laser prostatectomy in 2012, who returns with sudden onset of slow stream and nocturia. IP assess score changes 9. His evaluation shows that he has a peak flow of 16 cc/s with a mean flow of 9 cc. Postvoid residual is 12 cc.    Cystoscopy shows trabeculation, with early cellule formation. There is no bladder stone or tumor. However, he has nicely resected right lateral neck, but regrowth of prostate tissue on the left lateral neck and lateral lobe tissue, and very large right lateral lobe. He is on Ellik was, and would need resection with greenlight laser of the regrowth of the left bladder neck, and the right lateral lobe. He would then need a catheter for several days prior to voiding trial. I think this would help his new onset voiding dysfunction. Note that he takes Lasix, which adds to his urinary frequency.   Plan Greenlight laser of left bladder neck, and right lateral lobe of the prostate.   Discussion/Summary cc: Dr.Eve Mariana Single Electronically signed by : Carolan Clines, M.D.; Nov 17 2015  5:07PM EST

## 2016-01-14 MED ORDER — DEXTROSE 5 % IV SOLN
3.0000 g | INTRAVENOUS | Status: AC
  Administered 2016-01-15: 3 g via INTRAVENOUS
  Filled 2016-01-14: qty 3000

## 2016-01-15 ENCOUNTER — Ambulatory Visit (HOSPITAL_COMMUNITY)
Admission: RE | Admit: 2016-01-15 | Discharge: 2016-01-15 | Disposition: A | Discharge status: Home / Self Care | Payer: 59 | Source: Ambulatory Visit | Attending: Urology | Admitting: Urology

## 2016-01-15 ENCOUNTER — Encounter (HOSPITAL_COMMUNITY)
Admission: RE | Disposition: A | Discharge status: Home / Self Care | Payer: Self-pay | Source: Ambulatory Visit | Attending: Urology

## 2016-01-15 ENCOUNTER — Ambulatory Visit (HOSPITAL_COMMUNITY): Payer: 59 | Admitting: Certified Registered"

## 2016-01-15 ENCOUNTER — Encounter (HOSPITAL_COMMUNITY): Payer: Self-pay | Admitting: *Deleted

## 2016-01-15 ENCOUNTER — Ambulatory Visit (HOSPITAL_COMMUNITY): Payer: 59

## 2016-01-15 DIAGNOSIS — N401 Enlarged prostate with lower urinary tract symptoms: Secondary | ICD-10-CM | POA: Insufficient documentation

## 2016-01-15 DIAGNOSIS — Z951 Presence of aortocoronary bypass graft: Secondary | ICD-10-CM | POA: Diagnosis not present

## 2016-01-15 DIAGNOSIS — I739 Peripheral vascular disease, unspecified: Secondary | ICD-10-CM | POA: Insufficient documentation

## 2016-01-15 DIAGNOSIS — G4730 Sleep apnea, unspecified: Secondary | ICD-10-CM | POA: Diagnosis not present

## 2016-01-15 DIAGNOSIS — E119 Type 2 diabetes mellitus without complications: Secondary | ICD-10-CM | POA: Diagnosis not present

## 2016-01-15 DIAGNOSIS — N486 Induration penis plastica: Secondary | ICD-10-CM | POA: Diagnosis not present

## 2016-01-15 DIAGNOSIS — N138 Other obstructive and reflux uropathy: Secondary | ICD-10-CM | POA: Diagnosis not present

## 2016-01-15 DIAGNOSIS — R350 Frequency of micturition: Secondary | ICD-10-CM | POA: Insufficient documentation

## 2016-01-15 DIAGNOSIS — E291 Testicular hypofunction: Secondary | ICD-10-CM | POA: Diagnosis not present

## 2016-01-15 DIAGNOSIS — Z87891 Personal history of nicotine dependence: Secondary | ICD-10-CM | POA: Diagnosis not present

## 2016-01-15 DIAGNOSIS — I10 Essential (primary) hypertension: Secondary | ICD-10-CM | POA: Insufficient documentation

## 2016-01-15 DIAGNOSIS — I509 Heart failure, unspecified: Secondary | ICD-10-CM | POA: Diagnosis not present

## 2016-01-15 DIAGNOSIS — Z01811 Encounter for preprocedural respiratory examination: Secondary | ICD-10-CM

## 2016-01-15 DIAGNOSIS — N5201 Erectile dysfunction due to arterial insufficiency: Secondary | ICD-10-CM | POA: Diagnosis not present

## 2016-01-15 DIAGNOSIS — K219 Gastro-esophageal reflux disease without esophagitis: Secondary | ICD-10-CM | POA: Insufficient documentation

## 2016-01-15 DIAGNOSIS — I499 Cardiac arrhythmia, unspecified: Secondary | ICD-10-CM | POA: Diagnosis not present

## 2016-01-15 DIAGNOSIS — I252 Old myocardial infarction: Secondary | ICD-10-CM | POA: Diagnosis not present

## 2016-01-15 DIAGNOSIS — I2510 Atherosclerotic heart disease of native coronary artery without angina pectoris: Secondary | ICD-10-CM | POA: Insufficient documentation

## 2016-01-15 DIAGNOSIS — M1990 Unspecified osteoarthritis, unspecified site: Secondary | ICD-10-CM | POA: Diagnosis not present

## 2016-01-15 DIAGNOSIS — Z79899 Other long term (current) drug therapy: Secondary | ICD-10-CM | POA: Diagnosis not present

## 2016-01-15 DIAGNOSIS — Z7902 Long term (current) use of antithrombotics/antiplatelets: Secondary | ICD-10-CM | POA: Diagnosis not present

## 2016-01-15 DIAGNOSIS — Z96659 Presence of unspecified artificial knee joint: Secondary | ICD-10-CM | POA: Diagnosis not present

## 2016-01-15 DIAGNOSIS — Z8673 Personal history of transient ischemic attack (TIA), and cerebral infarction without residual deficits: Secondary | ICD-10-CM | POA: Insufficient documentation

## 2016-01-15 HISTORY — PX: GREEN LIGHT LASER TURP (TRANSURETHRAL RESECTION OF PROSTATE: SHX6260

## 2016-01-15 LAB — TYPE AND SCREEN
ABO/RH(D): A POS
Antibody Screen: NEGATIVE

## 2016-01-15 SURGERY — GREEN LIGHT LASER TURP (TRANSURETHRAL RESECTION OF PROSTATE
Anesthesia: General

## 2016-01-15 MED ORDER — ACETAMINOPHEN 500 MG PO TABS
1000.0000 mg | ORAL_TABLET | Freq: Four times a day (QID) | ORAL | Status: DC | PRN
  Administered 2016-01-15: 1000 mg via ORAL
  Filled 2016-01-15: qty 2

## 2016-01-15 MED ORDER — FENTANYL CITRATE (PF) 100 MCG/2ML IJ SOLN: 25.0000 ug | INTRAMUSCULAR | Status: DC | PRN

## 2016-01-15 MED ORDER — LIDOCAINE HCL (CARDIAC) 20 MG/ML IV SOLN
INTRAVENOUS | Status: DC | PRN
  Administered 2016-01-15: 100 mg via INTRAVENOUS

## 2016-01-15 MED ORDER — PROPOFOL 10 MG/ML IV BOLUS
INTRAVENOUS | Status: AC
  Filled 2016-01-15: qty 40

## 2016-01-15 MED ORDER — HYDROCODONE-IBUPROFEN 5-200 MG PO TABS
1.0000 | ORAL_TABLET | Freq: Four times a day (QID) | ORAL | Status: DC | PRN
Start: 2016-01-15 — End: 2016-01-29

## 2016-01-15 MED ORDER — BELLADONNA ALKALOIDS-OPIUM 16.2-60 MG RE SUPP
RECTAL | Status: DC | PRN
  Administered 2016-01-15: 1 via RECTAL

## 2016-01-15 MED ORDER — DEXAMETHASONE SODIUM PHOSPHATE 10 MG/ML IJ SOLN
INTRAMUSCULAR | Status: DC | PRN
  Administered 2016-01-15: 5 mg via INTRAVENOUS

## 2016-01-15 MED ORDER — HYOSCYAMINE SULFATE 0.125 MG SL SUBL: 0.1250 mg | SUBLINGUAL_TABLET | SUBLINGUAL | Status: DC | PRN

## 2016-01-15 MED ORDER — FENTANYL CITRATE (PF) 100 MCG/2ML IJ SOLN
INTRAMUSCULAR | Status: DC | PRN
  Administered 2016-01-15 (×2): 50 ug via INTRAVENOUS

## 2016-01-15 MED ORDER — SODIUM CHLORIDE 0.9 % IR SOLN
Status: DC | PRN
  Administered 2016-01-15: 7000 mL via INTRAVESICAL

## 2016-01-15 MED ORDER — PHENAZOPYRIDINE HCL 200 MG PO TABS: 200.0000 mg | ORAL_TABLET | Freq: Three times a day (TID) | ORAL | Status: DC | PRN

## 2016-01-15 MED ORDER — LIDOCAINE HCL (CARDIAC) 20 MG/ML IV SOLN
INTRAVENOUS | Status: AC
  Filled 2016-01-15: qty 5

## 2016-01-15 MED ORDER — BELLADONNA ALKALOIDS-OPIUM 16.2-60 MG RE SUPP
RECTAL | Status: AC
  Filled 2016-01-15: qty 1

## 2016-01-15 MED ORDER — PROPOFOL 10 MG/ML IV BOLUS
INTRAVENOUS | Status: DC | PRN
  Administered 2016-01-15: 200 mg via INTRAVENOUS

## 2016-01-15 MED ORDER — TRIMETHOPRIM 100 MG PO TABS: 100.0000 mg | ORAL_TABLET | Freq: Every day | ORAL | Status: DC

## 2016-01-15 MED ORDER — ONDANSETRON HCL 4 MG/2ML IJ SOLN
INTRAMUSCULAR | Status: DC | PRN
  Administered 2016-01-15 (×4): 2 mg via INTRAVENOUS

## 2016-01-15 MED ORDER — PHENYLEPHRINE HCL 10 MG/ML IJ SOLN
INTRAMUSCULAR | Status: DC | PRN
  Administered 2016-01-15: 120 ug via INTRAVENOUS
  Administered 2016-01-15: 40 ug via INTRAVENOUS
  Administered 2016-01-15: 80 ug via INTRAVENOUS
  Administered 2016-01-15: 160 ug via INTRAVENOUS

## 2016-01-15 MED ORDER — HYDROCODONE-IBUPROFEN 5-200 MG PO TABS: 1.0000 | ORAL_TABLET | Freq: Four times a day (QID) | ORAL | Status: DC | PRN

## 2016-01-15 MED ORDER — FUROSEMIDE 10 MG/ML IJ SOLN
INTRAMUSCULAR | Status: AC
  Administered 2016-01-15: 10 mg via INTRAMUSCULAR
  Filled 2016-01-15: qty 2

## 2016-01-15 MED ORDER — DEXAMETHASONE SODIUM PHOSPHATE 10 MG/ML IJ SOLN
INTRAMUSCULAR | Status: AC
  Filled 2016-01-15: qty 1

## 2016-01-15 MED ORDER — ONDANSETRON HCL 4 MG/2ML IJ SOLN
INTRAMUSCULAR | Status: AC
  Filled 2016-01-15: qty 4

## 2016-01-15 MED ORDER — FENTANYL CITRATE (PF) 100 MCG/2ML IJ SOLN
INTRAMUSCULAR | Status: AC
  Filled 2016-01-15: qty 2

## 2016-01-15 MED ORDER — LACTATED RINGERS IV SOLN
INTRAVENOUS | Status: DC | PRN
  Administered 2016-01-15: 08:00:00 via INTRAVENOUS

## 2016-01-15 MED ORDER — PHENYLEPHRINE 40 MCG/ML (10ML) SYRINGE FOR IV PUSH (FOR BLOOD PRESSURE SUPPORT)
PREFILLED_SYRINGE | INTRAVENOUS | Status: AC
  Filled 2016-01-15: qty 10

## 2016-01-15 SURGICAL SUPPLY — 16 items
BAG URINE DRAINAGE (UROLOGICAL SUPPLIES) ×2 IMPLANT
BAG URO CATCHER STRL LF (MISCELLANEOUS) ×2 IMPLANT
CATH FOLEY 2WAY SLVR 30CC 22FR (CATHETERS) IMPLANT
CATH HEMA 3WAY 30CC 22FR COUDE (CATHETERS) ×2 IMPLANT
GLOVE BIOGEL M STRL SZ7.5 (GLOVE) ×6 IMPLANT
GOWN STRL REUS W/TWL XL LVL3 (GOWN DISPOSABLE) ×4 IMPLANT
HOLDER FOLEY CATH W/STRAP (MISCELLANEOUS) ×2 IMPLANT
KIT ASPIRATION TUBING (SET/KITS/TRAYS/PACK) IMPLANT
LASER FIBER /GREENLIGHT LASER (Laser) ×2 IMPLANT
LASER GREENLIGHT RENTAL P/PROC (Laser) ×2 IMPLANT
MANIFOLD NEPTUNE II (INSTRUMENTS) ×2 IMPLANT
PACK CYSTO (CUSTOM PROCEDURE TRAY) ×2 IMPLANT
PLUG CATH AND CAP STER (CATHETERS) ×2 IMPLANT
SYR 30ML LL (SYRINGE) ×2 IMPLANT
SYRINGE IRR TOOMEY STRL 70CC (SYRINGE) ×2 IMPLANT
TUBING CONNECTING 10 (TUBING) ×4 IMPLANT

## 2016-01-15 NOTE — Interval H&P Note (Signed)
History and Physical Interval Note:  01/15/2016 8:57 AM  Laury Deep  has presented today for surgery, with the diagnosis of BENIGN PROSTATIC HYPERPLASIA  The various methods of treatment have been discussed with the patient and family. After consideration of risks, benefits and other options for treatment, the patient has consented to  Procedure(s) with comments: GREEN LIGHT LASER TURP (TRANSURETHRAL RESECTION OF PROSTATE (N/A) - LASER RIGHT LATERAL LOBE OF PROSTATE AND LEFT BLADDER NECK   as a surgical intervention .  The patient's history has been reviewed, patient examined, no change in status, stable for surgery.  I have reviewed the patient's chart and labs.  Questions were answered to the patient's satisfaction.     Gloriana Piltz I Yuri Fana  We have again discussed Goals, liklihood of success, complications of bleeding ( on Elequis), incontinence, infection; and disability ( needing a catheter for several  Days. We will proceed as an outpatient, unless he has significant bleeding/cardiac complications  requiring hospitalization.

## 2016-01-15 NOTE — Anesthesia Postprocedure Evaluation (Signed)
Anesthesia Post Note  Patient: ARROW JENSEN  Procedure(s) Performed: Procedure(s) (LRB): Makala Fetterolf LIGHT LASER TURP (TRANSURETHRAL RESECTION OF PROSTATE (N/A)  Patient location during evaluation: PACU Anesthesia Type: General Level of consciousness: awake Vital Signs Assessment: post-procedure vital signs reviewed and stable Respiratory status: spontaneous breathing Cardiovascular status: stable Anesthetic complications: no    Last Vitals:  Filed Vitals:   01/15/16 1042 01/15/16 1114  BP: 114/71 126/50  Pulse: 63 62  Temp: 36.5 C   Resp: 16 16    Last Pain:  Filed Vitals:   01/15/16 1115  PainSc: 4                  EDWARDS,Phares Zaccone

## 2016-01-15 NOTE — Addendum Note (Signed)
Addendum  created 01/15/16 1151 by Freddie Breech, CRNA   Modules edited: Anesthesia Flowsheet

## 2016-01-15 NOTE — Addendum Note (Signed)
Addendum  created 01/15/16 1136 by Finis Bud, MD   Modules edited: Anesthesia Review and Sign Navigator Section, Clinical Notes   Clinical Notes:  File: [id number]

## 2016-01-15 NOTE — Anesthesia Preprocedure Evaluation (Addendum)
Anesthesia Evaluation  Patient identified by MRN, date of birth, ID band Patient awake    Reviewed: Allergy & Precautions, NPO status , Patient's Chart, lab work & pertinent test results  Airway Mallampati: II  TM Distance: <3 FB     Dental   Pulmonary shortness of breath, sleep apnea , pneumonia, former smoker,    breath sounds clear to auscultation       Cardiovascular hypertension, + angina + CAD, + Past MI, + Peripheral Vascular Disease and +CHF  + dysrhythmias  Rhythm:Irregular Rate:Normal     Neuro/Psych    GI/Hepatic GERD  ,(+) Hepatitis -  Endo/Other  diabetes  Renal/GU      Musculoskeletal   Abdominal   Peds  Hematology   Anesthesia Other Findings   Reproductive/Obstetrics                         Anesthesia Physical Anesthesia Plan  ASA: IV  Anesthesia Plan: General   Post-op Pain Management:    Induction: Intravenous  Airway Management Planned: LMA  Additional Equipment:   Intra-op Plan:   Post-operative Plan: Extubation in OR  Informed Consent: I have reviewed the patients History and Physical, chart, labs and discussed the procedure including the risks, benefits and alternatives for the proposed anesthesia with the patient or authorized representative who has indicated his/her understanding and acceptance.     Plan Discussed with: Anesthesiologist and CRNA  Anesthesia Plan Comments:         Anesthesia Quick Evaluation

## 2016-01-15 NOTE — Anesthesia Procedure Notes (Addendum)
Procedure Name: LMA Insertion Date/Time: 01/15/2016 9:11 AM Performed by: Freddie Breech Pre-anesthesia Checklist: Emergency Drugs available, Patient identified, Suction available, Patient being monitored and Timeout performed Patient Re-evaluated:Patient Re-evaluated prior to inductionOxygen Delivery Method: Circle system utilized Preoxygenation: Pre-oxygenation with 100% oxygen Intubation Type: IV induction LMA: LMA with gastric port inserted LMA Size: 5.0 Number of attempts: 1 Airway Equipment and Method: Patient positioned with wedge pillow and Stylet Placement Confirmation: positive ETCO2,  CO2 detector and breath sounds checked- equal and bilateral Tube secured with: Tape Dental Injury: Teeth and Oropharynx as per pre-operative assessment

## 2016-01-15 NOTE — Op Note (Signed)
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 Gaynelle Arabian, MD  First assistant:  None  Anesthesia:  Gen. LMA  Preparation:  After appropriate pre-anesthesia, the patient was brought the operating room, placed on the operating table in the dorsal supine position where general LMA anesthesia was introduced. He was then replaced in the dorsal lithotomy position where the pubis was prepped with Betadine solution and draped in usual fashion. The history was reviewed. The armband was double checked.  Review history:  Problems  1. Benign prostatic hyperplasia with urinary obstruction (N40.1,N13.8)  Assessed By: Carolan Clines (Urology); Last Assessed: 17 Nov 2015 2. 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 Hypogonadism, testicular (E29.1) 4. Increased urinary frequency (R35.0) 5. Nocturia (R35.1) 6. 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 Urinary urgency (R39.15)  Assessed By: Carolan Clines (Urology); Last Assessed: 17 Nov 2015  History of Present Illness   75 yo male returns today for a cystoscopy, flowrate & PVR for hx of LUTS, looking for regrowth of prostate tissue. Hx of BPH, ED, hypogonadism (on Testosterone inj 200mg  q other week) & Peyronie's disease. He takes Lasix in the AM, and notes that his stream is not as strong. 3 weeks ago, he noted the sudden onset of poor voiding, with slow stream without dribbling. His stream has not improved. He has nocturia x 2, with definite slowing of his stream. IPSS= 9 today.     Statement of  Likelihood of Success: Excellent. TIME-OUT observed.:  Procedure:  Cystourethroscopy was accomplished, which showed right lateral prostatic lobe hypertrophy,  and bladder neck contracture on the left side. The continuous flow drainage tube was attached, and the greenlight laser fiber was activated, with a strength of 80 W. The left bladder neck was incised with no bleeding noted. The bladder neck incision was carried from the bladder neck to the Vero, creating a trench on the left side.  Attention was directed to the right lateral lobe hypertrophy. Using 35 W power, incision was begun at the bladder neck, and the left upper edge of the lateral lobe was incised, and dissected medialward. The proximal edge of the hypertrophy was carried to the level of the 0, and stopped. The edge was curved downward to the Vero, and then proximalward to encircle the lateral lobe. The entire lateral lobe was then vaporized using 80 W power. No bleeding was noted. Some 100 white power was used on the left side to clean up the left lateral trench.  Photo document was accomplished. A 22 French hematuria catheter was placed with 30 mL sterile water within the balloon. No traction was noted. CBI was placed simply to finish the saline that had previously been running, but will not be continued in recovery room. The CBI port will be plugged. This catheter was used in case of bleeding because the patient takes a blood thinner.  The patient was awakened, after B and O suppository was placed, and taken to recovery room in excellent condition. He did receive IV antibiotic, and also 10 g of IV Lasix per anesthesia.

## 2016-01-15 NOTE — Transfer of Care (Signed)
Immediate Anesthesia Transfer of Care Note  Patient: Jeffrey Wiggins  Procedure(s) Performed: Procedure(s) with comments: GREEN LIGHT LASER TURP (TRANSURETHRAL RESECTION OF PROSTATE (N/A) - LASER RIGHT LATERAL LOBE OF PROSTATE AND LEFT BLADDER NECK    Patient Location: PACU  Anesthesia Type:General  Level of Consciousness:  sedated, patient cooperative and responds to stimulation  Airway & Oxygen Therapy:Patient Spontanous Breathing and Patient connected to face mask oxgen  Post-op Assessment:  Report given to PACU RN and Post -op Vital signs reviewed and stable  Post vital signs:  Reviewed and stable  Last Vitals:  Filed Vitals:   01/15/16 0723  BP: 117/72  Pulse: 78  Temp: 36.9 C  Resp: 20    Complications: No apparent anesthesia complications

## 2016-01-15 NOTE — Discharge Instructions (Addendum)
Post transurethral resection of the prostate (TURP) instructions  Your recent prostate surgery requires very special post hospital care. Despite the fact that no skin incisions were used the area around the prostate incision is quite raw and is covered with a scab to promote healing and prevent bleeding. Certain cautions are needed to assure that the scab is not disturbed of the next 2-3 weeks while the healing proceeds.  Because the raw surface in your prostate and the irritating effects of urine you may expect frequency of urination and/or urgency (a stronger desire to urinate) and perhaps even getting up at night more often. This will usually resolve or improve slowly over the healing period. You may see some blood in your urine over the first 6 weeks. Do not be alarmed, even if the urine was clear for a while. Get off your feet and drink lots of fluids until clearing occurs. If you start to pass clots or don't improve call us.  Diet:  You may return to your normal diet immediately. Because of the raw surface of your bladder, alcohol, spicy foods, foods high in acid and drinks with caffeine may cause irritation or frequency and should be used in moderation. To keep your urine flowing freely and avoid constipation, drink plenty of fluids during the day (8-10 glasses). Tip: Avoid cranberry juice because it is very acidic.  Activity:  Your physical activity doesn't need to be restricted. However, if you are very active, you may see some blood in the urine. We suggest that you reduce your activity under the circumstances until the bleeding has stopped.  Bowels:  It is important to keep your bowels regular during the postoperative period. Straining with bowel movements can cause bleeding. A bowel movement every other day is reasonable. Use a mild laxative if needed, such as milk of magnesia 2-3 tablespoons, or 2 Dulcolax tablets. Call if you continue to have problems. If you had been taking narcotics  for pain, before, during or after your surgery, you may be constipated. Take a laxative if necessary.  Medication:  You should resume your pre-surgery medications unless told not to. In addition you may be given an antibiotic to prevent or treat infection. Antibiotics are not always necessary. All medication should be taken as prescribed until the bottles are finished unless you are having an unusual reaction to one of the drugs.     Problems you should report to [place]:  a. Fever greater than 101F. b. Heavy bleeding, or clots (see notes above about blood in urine). c. Inability to urinate. d. Drug reactions (hives, rash, nausea, vomiting, diarrhea). e. Severe burning or pain with urination that is not improving.  General Anesthesia, Adult, Care After Refer to this sheet in the next few weeks. These instructions provide you with information on caring for yourself after your procedure. Your health care provider may also give you more specific instructions. Your treatment has been planned according to current medical practices, but problems sometimes occur. Call your health care provider if you have any problems or questions after your procedure. WHAT TO EXPECT AFTER THE PROCEDURE After the procedure, it is typical to experience:  Sleepiness.  Nausea and vomiting. HOME CARE INSTRUCTIONS  For the first 24 hours after general anesthesia:  Have a responsible person with you.  Do not drive a car. If you are alone, do not take public transportation.  Do not drink alcohol.  Do not take medicine that has not been prescribed by your health care provider.  Do not sign important papers or make important decisions.  You may resume a normal diet and activities as directed by your health care provider.  Change bandages (dressings) as directed.  If you have questions or problems that seem related to general anesthesia, call the hospital and ask for the anesthetist or anesthesiologist on  call. SEEK MEDICAL CARE IF:  You have nausea and vomiting that continue the day after anesthesia.  You develop a rash. SEEK IMMEDIATE MEDICAL CARE IF:   You have difficulty breathing.  You have chest pain.  You have any allergic problems.   This information is not intended to replace advice given to you by your health care provider. Make sure you discuss any questions you have with your health care provider.   Document Released: 12/09/2000 Document Revised: 09/23/2014 Document Reviewed: 01/01/2012 Elsevier Interactive Patient Education [employer].

## 2016-01-20 ENCOUNTER — Encounter (HOSPITAL_COMMUNITY): Payer: Self-pay | Admitting: Emergency Medicine

## 2016-01-20 ENCOUNTER — Emergency Department (HOSPITAL_COMMUNITY)
Admission: EM | Admit: 2016-01-20 | Discharge: 2016-01-20 | Disposition: A | Discharge status: Home / Self Care | Payer: 59 | Attending: Emergency Medicine | Admitting: Emergency Medicine

## 2016-01-20 DIAGNOSIS — Z7901 Long term (current) use of anticoagulants: Secondary | ICD-10-CM | POA: Insufficient documentation

## 2016-01-20 DIAGNOSIS — I2510 Atherosclerotic heart disease of native coronary artery without angina pectoris: Secondary | ICD-10-CM | POA: Diagnosis not present

## 2016-01-20 DIAGNOSIS — Z791 Long term (current) use of non-steroidal anti-inflammatories (NSAID): Secondary | ICD-10-CM | POA: Diagnosis not present

## 2016-01-20 DIAGNOSIS — Z8673 Personal history of transient ischemic attack (TIA), and cerebral infarction without residual deficits: Secondary | ICD-10-CM | POA: Insufficient documentation

## 2016-01-20 DIAGNOSIS — K5790 Diverticulosis of intestine, part unspecified, without perforation or abscess without bleeding: Secondary | ICD-10-CM | POA: Insufficient documentation

## 2016-01-20 DIAGNOSIS — I10 Essential (primary) hypertension: Secondary | ICD-10-CM | POA: Insufficient documentation

## 2016-01-20 DIAGNOSIS — Z96653 Presence of artificial knee joint, bilateral: Secondary | ICD-10-CM | POA: Insufficient documentation

## 2016-01-20 DIAGNOSIS — Z7951 Long term (current) use of inhaled steroids: Secondary | ICD-10-CM | POA: Insufficient documentation

## 2016-01-20 DIAGNOSIS — Z792 Long term (current) use of antibiotics: Secondary | ICD-10-CM | POA: Insufficient documentation

## 2016-01-20 DIAGNOSIS — R300 Dysuria: Secondary | ICD-10-CM | POA: Diagnosis present

## 2016-01-20 DIAGNOSIS — R339 Retention of urine, unspecified: Secondary | ICD-10-CM

## 2016-01-20 DIAGNOSIS — R319 Hematuria, unspecified: Secondary | ICD-10-CM

## 2016-01-20 DIAGNOSIS — N401 Enlarged prostate with lower urinary tract symptoms: Secondary | ICD-10-CM | POA: Diagnosis not present

## 2016-01-20 DIAGNOSIS — I739 Peripheral vascular disease, unspecified: Secondary | ICD-10-CM | POA: Diagnosis not present

## 2016-01-20 DIAGNOSIS — Z79891 Long term (current) use of opiate analgesic: Secondary | ICD-10-CM | POA: Insufficient documentation

## 2016-01-20 DIAGNOSIS — Z951 Presence of aortocoronary bypass graft: Secondary | ICD-10-CM | POA: Insufficient documentation

## 2016-01-20 DIAGNOSIS — Z87891 Personal history of nicotine dependence: Secondary | ICD-10-CM | POA: Insufficient documentation

## 2016-01-20 DIAGNOSIS — E785 Hyperlipidemia, unspecified: Secondary | ICD-10-CM | POA: Insufficient documentation

## 2016-01-20 DIAGNOSIS — K219 Gastro-esophageal reflux disease without esophagitis: Secondary | ICD-10-CM | POA: Diagnosis not present

## 2016-01-20 DIAGNOSIS — I252 Old myocardial infarction: Secondary | ICD-10-CM | POA: Insufficient documentation

## 2016-01-20 DIAGNOSIS — Z955 Presence of coronary angioplasty implant and graft: Secondary | ICD-10-CM | POA: Insufficient documentation

## 2016-01-20 DIAGNOSIS — Z79899 Other long term (current) drug therapy: Secondary | ICD-10-CM | POA: Insufficient documentation

## 2016-01-20 DIAGNOSIS — M1990 Unspecified osteoarthritis, unspecified site: Secondary | ICD-10-CM | POA: Insufficient documentation

## 2016-01-20 DIAGNOSIS — E669 Obesity, unspecified: Secondary | ICD-10-CM | POA: Insufficient documentation

## 2016-01-20 LAB — COMPREHENSIVE METABOLIC PANEL WITH GFR
ALT: 35 U/L (ref 17–63)
AST: 31 U/L (ref 15–41)
Albumin: 3.4 g/dL — ABNORMAL LOW (ref 3.5–5.0)
Alkaline Phosphatase: 52 U/L (ref 38–126)
Anion gap: 11 (ref 5–15)
BUN: 28 mg/dL — ABNORMAL HIGH (ref 6–20)
CO2: 25 mmol/L (ref 22–32)
Calcium: 8.6 mg/dL — ABNORMAL LOW (ref 8.9–10.3)
Chloride: 101 mmol/L (ref 101–111)
Creatinine, Ser: 1.85 mg/dL — ABNORMAL HIGH (ref 0.61–1.24)
GFR calc Af Amer: 40 mL/min — ABNORMAL LOW
GFR calc non Af Amer: 34 mL/min — ABNORMAL LOW
Glucose, Bld: 85 mg/dL (ref 65–99)
Potassium: 4.5 mmol/L (ref 3.5–5.1)
Sodium: 137 mmol/L (ref 135–145)
Total Bilirubin: 1.4 mg/dL — ABNORMAL HIGH (ref 0.3–1.2)
Total Protein: 6.5 g/dL (ref 6.5–8.1)

## 2016-01-20 LAB — CBC WITH DIFFERENTIAL/PLATELET
Basophils Absolute: 0 K/uL (ref 0.0–0.1)
Basophils Relative: 0 %
Eosinophils Absolute: 0.1 K/uL (ref 0.0–0.7)
Eosinophils Relative: 1 %
HCT: 47.6 % (ref 39.0–52.0)
Hemoglobin: 14.9 g/dL (ref 13.0–17.0)
Lymphocytes Relative: 7 %
Lymphs Abs: 1.1 K/uL (ref 0.7–4.0)
MCH: 25.5 pg — ABNORMAL LOW (ref 26.0–34.0)
MCHC: 31.3 g/dL (ref 30.0–36.0)
MCV: 81.5 fL (ref 78.0–100.0)
Monocytes Absolute: 2.7 K/uL — ABNORMAL HIGH (ref 0.1–1.0)
Monocytes Relative: 17 %
Neutro Abs: 12.2 K/uL — ABNORMAL HIGH (ref 1.7–7.7)
Neutrophils Relative %: 75 %
Platelets: 226 K/uL (ref 150–400)
RBC: 5.84 MIL/uL — ABNORMAL HIGH (ref 4.22–5.81)
RDW: 18.5 % — ABNORMAL HIGH (ref 11.5–15.5)
WBC: 16.1 K/uL — ABNORMAL HIGH (ref 4.0–10.5)

## 2016-01-20 LAB — URINALYSIS, ROUTINE W REFLEX MICROSCOPIC
Glucose, UA: NEGATIVE mg/dL
KETONES UR: 40 mg/dL — AB
Nitrite: POSITIVE — AB
Protein, ur: >300 mg/dL — AB
SPECIFIC GRAVITY, URINE: 1.016 (ref 1.005–1.030)
pH: 5.5 (ref 5.0–8.0)

## 2016-01-20 LAB — URINE MICROSCOPIC-ADD ON: Squamous Epithelial / LPF: NONE SEEN

## 2016-01-20 NOTE — ED Provider Notes (Signed)
CSN: [id number]     Arrival date & time 01/20/16  1033 History   First MD Initiated Contact with Patient 01/20/16 1102     Chief Complaint  Patient presents with  . Dysuria     (Consider location/radiation/quality/duration/timing/severity/associated sxs/prior Treatment) HPI   NAINOA STOLARZ is a 75 year old male with past medical history of CAD s/p CABG x2, CVA, A. fib, HTN presents to the ED today complaining of urinary retention. Patient states that 5 days ago he had a TURP procedure performed. Patient states the procedure went well he was not having any consultations until 2 days later when he noticed that there was blood in his Foley bag. Yesterday he states he followed up with the urologist and have the Foley catheter removed. Patient states that he was doing well and was able to urinate appropriately without hematuria. However, last night and into this morning he felt the urge to urinate but was unable to go and noticed only a small amount of blood leaking from the end of his penis. Patient is concerned because he is on Eliquis. He states that he tried contacting the urology emergency hotline but was unable to get recently came to the ED for further evaluation. He denies abdominal pain, vomiting, dizziness, syncope, chest pain or shortness of breath.   Past Medical History  Diagnosis Date  . CAD (coronary artery disease)   . HTN (hypertension)   . Prostatitis   . Hyperlipidemia   . Obesity   . Diverticular disease   . Gallstones   . Arthritis   . Sleep apnea     mild, CPAP wasn't indicated (per pt)--2014 mod-severe, on CPAP since 07/2013  . Benign prostatic hypertrophy     followed by Dr. Gaynelle Arabian; regrowth R lat lob (11/2015)  . Cerebrovascular disease   . Sepsis due to Escherichia coli [facility]) ? 2011    DUE TO URINARY OUTLET OBSTRUCTION /INFECTION  . UTI (urinary tract infection)     secondary  . Allergy     RHINITIS  . GERD (gastroesophageal reflux disease)   .  Hypogonadism male     treated by Dr. Gaynelle Arabian  . Erectile dysfunction   . Peripheral vascular disease ([place])     02/28/12 MOST RECENT CAROTID DUPLEX--"STABLE MILD CALCIFIED PLAQUE, BILATERALY, STABLE, OVER SERIAL EXAMS"  . 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 Diverticulosis     seen on colonoscopy (9/05, 06/2009)  . Hyperplastic colon polyp 05/2004  . Myocardial infarction [facility]) '98, '09    (Dr. Stanford Breed); s/p stents  . Stroke [facility]) jan 2016  . Pneumonia age 88    hx of  . Shortness of breath dyspnea     with exertion only  . Hepatitis age 29    "called yelow jaundice"  . Nocturia   . Cataracts, bilateral   . Family history of adverse reaction to anesthesia     mother had memeory problems after anesthesia with hip replacement, lasted until she died 2 to 3 years later   Past Surgical History  Procedure Laterality Date  . Total knee arthroplasty  L 4/06, R 4/07  . Vasectomy    . Tonsillectomy    . Transurethral resection of prostate  06/2010  . Knee surgery  05/2010    removing scar tissue R knee (Dr.  Aluisio)  . Joint replacement      both knees  . Total knee revision  09/23/2012    Procedure: TOTAL KNEE REVISION;  Surgeon: Gearlean Alf, MD; right side  Location: [facility];  Service: Orthopedics;  Laterality: Right;  . Intraoperative transesophageal echocardiogram N/A 04/15/2014    Procedure: INTRAOPERATIVE TRANSESOPHAGEAL ECHOCARDIOGRAM;  Surgeon: Rexene Alberts, MD;  Location: [place];  Service: Open Heart Surgery;  Laterality: N/A;  . Left and right heart catheterization with coronary angiogram  04/13/2014    Procedure: LEFT AND RIGHT HEART CATHETERIZATION WITH CORONARY ANGIOGRAM;  Surgeon: Sinclair Grooms, MD;  Location: [facility] CATH LAB;  Service: Cardiovascular;;  . Cystoscopy  11/17/15    [place]  . Coronary artery bypass graft N/A 04/15/2014    Procedure: CORONARY  ARTERY BYPASS GRAFTING TIMES TWO USEING LEFT INTERNAL MAMMARY ARTERY AND RIGHT GREATER SAPHENOUS VEIN VIA ENDOVEIN HARVEST;  Surgeon: Rexene Alberts, MD;  Location: [place];  Service: Open Heart Surgery;  Laterality: N/A;  . Coronary angioplasty with stent placement  01/1997, stent x 1, 2009 2nd stent in same location    PTCA/stent to RCA  AND 2009 2ND STENT PLACED  . Green light laser turp (transurethral resection of prostate N/A 01/15/2016    Procedure: GREEN LIGHT LASER TURP (TRANSURETHRAL RESECTION OF PROSTATE;  Surgeon: Carolan Clines, MD;  Location: [facility];  Service: Urology;  Laterality: N/A;  LASER RIGHT LATERAL LOBE OF PROSTATE AND LEFT BLADDER NECK     Family History  Problem Relation Age of Onset  . Coronary artery disease Mother     deceased  . Dementia Mother   . Atrial fibrillation Mother   . Stroke Mother     related to atrial fib  . Coronary artery disease Father     deceased  . Hypertension Father   . Heart disease Father   . Cancer Paternal Uncle     colon; died in his 39's  . Colon cancer Paternal Uncle   . Hypertension Paternal Grandmother   . Heart disease Paternal Grandmother   . Hypertension Paternal Grandfather   . Heart disease Paternal Grandfather   . Diabetes Neg Hx    Social History  Substance Use Topics  . Smoking status: Former Smoker -- 1.50 packs/day for 30 years    Types: Cigarettes    Quit date: 09/16/1986  . Smokeless tobacco: Never Used     Comment: 35 pack year history, none in 18 years  . Alcohol Use: 0.0 oz/week    0 Standard drinks or equivalent per week     Comment: 1 glass of wine three a week (previously daily)    Review of Systems  All other systems reviewed and are negative.     Allergies  Review of patient's allergies indicates no known allergies.  Home Medications   Prior to Admission medications   Medication Sig Start Date End Date Taking? Authorizing Provider  acetaminophen (TYLENOL) 500 MG tablet Take 1,000 mg by  mouth at bedtime as needed for mild pain.     Historical Provider, MD  apixaban (ELIQUIS) 5 MG TABS tablet Take 1 tablet (5 mg total) by mouth 2 (two) times daily. 11/14/15   Lelon Perla, MD  atorvastatin (LIPITOR) 80 MG tablet Take 1 tablet (80 mg total) by mouth every evening. 09/25/15   Lelon Perla, MD  CALCIUM-MAGNESIUM-ZINC PO Take 1 each by mouth daily.    Historical Provider, MD  Cholecalciferol (VITAMIN D) 2000 UNITS tablet  Take 4,000 Units by mouth daily.     Historical Provider, MD  Coenzyme Q10 (CO Q-10) 100 MG CAPS Take 1 capsule by mouth daily.    Historical Provider, MD  fluticasone (FLONASE) 50 MCG/ACT nasal spray USE 2 SPRAYS NASALLY DAILY 09/14/15   Rita Ohara, MD  furosemide (LASIX) 40 MG tablet Take 1 tablet (40 mg total) by mouth daily. 09/25/15   Lelon Perla, MD  hydrocodone-ibuprofen (VICOPROFEN) 5-200 MG tablet Take 1 tablet by mouth every 6 (six) hours as needed for pain. May increase to 2 tabs q 6 h. 01/15/16   Carolan Clines, MD  hyoscyamine (LEVSIN/SL) 0.125 MG SL tablet Place 1 tablet (0.125 mg total) under the tongue every 4 (four) hours as needed for cramping (spasms). 01/15/16   Carolan Clines, MD  IRON PO Take 25 mg by mouth daily.    Historical Provider, MD  loratadine (CLARITIN) 10 MG tablet Take 10 mg by mouth at bedtime.     Historical Provider, MD  losartan (COZAAR) 25 MG tablet Take 1 tablet (25 mg total) by mouth daily. 09/25/15   Lelon Perla, MD  metoprolol tartrate (LOPRESSOR) 25 MG tablet Take 1 tablet (25 mg total) by mouth 2 (two) times daily. 11/14/15   Lelon Perla, MD  Multiple Vitamin (MULTIVITAMIN) tablet Take 1 tablet by mouth daily. Centrum Silver    Historical Provider, MD  nitroGLYCERIN (NITROSTAT) 0.4 MG SL tablet Place 1 tablet (0.4 mg total) under the tongue every 5 (five) minutes as needed for chest pain. 07/03/15   Lelon Perla, MD  omeprazole (PRILOSEC) 20 MG capsule Take 20 mg by mouth every other day. hs    Historical  Provider, MD  phenazopyridine (PYRIDIUM) 200 MG tablet Take 1 tablet (200 mg total) by mouth 3 (three) times daily as needed for pain. Will discolor urine orange Will stain clothing 01/15/16   Carolan Clines, MD  spironolactone (ALDACTONE) 25 MG tablet Take 1 tablet (25 mg total) by mouth daily. 09/25/15   Lelon Perla, MD  testosterone cypionate (DEPOTESTOTERONE CYPIONATE) 200 MG/ML injection Inject 200 mg into the muscle every 14 (fourteen) days.  01/12/14   Historical Provider, MD  trimethoprim (TRIMPEX) 100 MG tablet Take 1 tablet (100 mg total) by mouth daily. 01/15/16   Carolan Clines, MD   BP 144/104 mmHg  Pulse 89  Temp(Src) 98.8 F (37.1 C) (Oral)  Resp 18  SpO2 98% Physical Exam  Constitutional: He is oriented to person, place, and time. He appears well-developed and well-nourished. No distress.  HENT:  Head: Normocephalic and atraumatic.  Mouth/Throat: No oropharyngeal exudate.  Eyes: Conjunctivae and EOM are normal. Pupils are equal, round, and reactive to light. Right eye exhibits no discharge. Left eye exhibits no discharge. No scleral icterus.  Cardiovascular: Normal rate, regular rhythm, normal heart sounds and intact distal pulses.  Exam reveals no gallop and no friction rub.   No murmur heard. Pulmonary/Chest: Effort normal and breath sounds normal. No respiratory distress. He has no wheezes. He has no rales. He exhibits no tenderness.  Abdominal: Soft. Bowel sounds are normal. He exhibits no distension and no mass. There is no tenderness. There is no rebound and no guarding.  Genitourinary:  61 French Foley catheter placed. Small amount of blood leaking from glans without tenderness. No purulent drainage.  Musculoskeletal: Normal range of motion. He exhibits no edema.  Neurological: He is alert and oriented to person, place, and time.  Skin: Skin is warm and dry. No  rash noted. He is not diaphoretic. No erythema. No pallor.  Psychiatric: He has a normal mood and  affect. His behavior is normal.  Nursing note and vitals reviewed.   ED Course  Procedures (including critical care time) Labs Review Labs Reviewed  URINALYSIS, ROUTINE W REFLEX MICROSCOPIC (NOT AT [facility])  COMPREHENSIVE METABOLIC PANEL  CBC WITH DIFFERENTIAL/PLATELET    Imaging Review No results found. I have personally reviewed and evaluated these images and lab results as part of my medical decision-making.   EKG Interpretation None      MDM   Final diagnoses:  Hematuria  Urinary retention    75 year old male Who presents to the ED with urinary retention and hematuria. He had a TURP procedure done on Monday. This Foley catheter was removed yesterday and was not having any issues until last night when he was unable to urinate. He is having mild tenderness around his penis. On presentation to ED, he appears well. Nontoxic, nonseptic appearing and in no apparent distress. A 16 French catheter was placed and patient released over 250 mL which appears to be hematuria. His patient reports significant relief. He is on Eliquis. Hemoglobin is stable at this time. No significant blood noted. Mild bump in creatinine to 1.85 LIKELY due to his acute urinary retention. I spoke with urology, Dr. Nyoka Cowden who is states that given patient's Foley is now draining and his hemoglobin is stable. Feel the patient is safe for discharge with urology follow-up today. We'll place a larger Foley to avoid clotting blockage of the Foley catheter. Discussed treatment plan with patient who is agreeable. He will not miss any doses of his Eliquis. Return precautions outlined in patient discharge instructions.  Patient was discussed with and seen by Dr. Regenia Skeeter who agrees with the treatment plan.      Dondra Spry [place], PA-C 01/20/16 [place], MD 01/20/16 [phone]

## 2016-01-20 NOTE — ED Notes (Addendum)
Foley catheter flushed with 273ml-aspirated 266ml of bloody urine with blood clots.

## 2016-01-20 NOTE — Discharge Instructions (Signed)
Hematuria, Adult Hematuria is blood in your urine. It can be caused by a bladder infection, kidney infection, prostate infection, kidney stone, or cancer of your urinary tract. Infections can usually be treated with medicine, and a kidney stone usually will pass through your urine. If neither of these is the cause of your hematuria, further workup to find out the reason may be needed. It is very important that you tell your health care provider about any blood you see in your urine, even if the blood stops without treatment or happens without causing pain. Blood in your urine that happens and then stops and then happens again can be a symptom of a very serious condition. Also, pain is not a symptom in the initial stages of many urinary cancers. HOME CARE INSTRUCTIONS   Drink lots of fluid, 3-4 quarts a day. If you have been diagnosed with an infection, cranberry juice is especially recommended, in addition to large amounts of water.  Avoid caffeine, tea, and carbonated beverages because they tend to irritate the bladder.  Avoid alcohol because it may irritate the prostate.  Take all medicines as directed by your health care provider.  If you were prescribed an antibiotic medicine, finish it all even if you start to feel better.  If you have been diagnosed with a kidney stone, follow your health care provider's instructions regarding straining your urine to catch the stone.  Empty your bladder often. Avoid holding urine for long periods of time.  After a bowel movement, women should cleanse front to back. Use each tissue only once.  Empty your bladder before and after sexual intercourse if you are a male. SEEK MEDICAL CARE IF:  You develop back pain.  You have a fever.  You have a feeling of sickness in your stomach (nausea) or vomiting.  Your symptoms are not better in 3 days. Return sooner if you are getting worse. SEEK IMMEDIATE MEDICAL CARE IF:   You develop severe vomiting and  are unable to keep the medicine down.  You develop severe back or abdominal pain despite taking your medicines.  You begin passing a large amount of blood or clots in your urine.  You feel extremely weak or faint, or you pass out. MAKE SURE YOU:   Understand these instructions.  Will watch your condition.  Will get help right away if you are not doing well or get worse.   This information is not intended to replace advice given to you by your health care provider. Make sure you discuss any questions you have with your health care provider.   Document Released: 09/02/2005 Document Revised: 09/23/2014 Document Reviewed: 05/03/2013 Elsevier Interactive Patient Education 2016 [place].  Acute Urinary Retention, Male Acute urinary retention is the temporary inability to urinate. This is a common problem in older men. As men age their prostates become larger and block the flow of urine from the bladder. This is usually a problem that has come on gradually.  HOME CARE INSTRUCTIONS If you are sent home with a Foley catheter and a drainage system, you will need to discuss the best course of action with your health care provider. While the catheter is in, maintain a good intake of fluids. Keep the drainage bag emptied and lower than your catheter. This is so that contaminated urine will not flow back into your bladder, which could lead to a urinary tract infection. There are two main types of drainage bags. One is a large bag that usually is used  at night. It has a good capacity that will allow you to sleep through the night without having to empty it. The second type is called a leg bag. It has a smaller capacity, so it needs to be emptied more frequently. However, the main advantage is that it can be attached by a leg strap and can go underneath your clothing, allowing you the freedom to move about or leave your home. Only take over-the-counter or prescription medicines for pain, discomfort,  or fever as directed by your health care provider.  SEEK MEDICAL CARE IF:  You develop a low-grade fever.  You experience spasms or leakage of urine with the spasms. SEEK IMMEDIATE MEDICAL CARE IF:   You develop chills or fever.  Your catheter stops draining urine.  Your catheter falls out.  You start to develop increased bleeding that does not respond to rest and increased fluid intake. MAKE SURE YOU:  Understand these instructions.  Will watch your condition.  Will get help right away if you are not doing well or get worse.   This information is not intended to replace advice given to you by your health care provider. Make sure you discuss any questions you have with your health care provider.   Follow-up with Dr. Gaynelle Arabian on Monday for reevaluation. Return to the ED if you experience abdominal pain, fevers, chills or if your Foley catheter stops draining.

## 2016-01-20 NOTE — ED Notes (Signed)
Pt reports no leaking noted from around the catheter at this time.

## 2016-01-20 NOTE — ED Notes (Signed)
Pt reports having TURP done Monday-had some bleeding until Wednesday.  Started to having bleeding again last night and urinary frequency.  Pt reports urinary retention since 0800 this am with bloody urine leaking from his penis.  Pt is on Eloquis d/t A-fib.  Pt reports feeling discomfort.

## 2016-01-20 NOTE — ED Notes (Signed)
Per pt, states TURP on Monday-had cath removed on Friday-states has'nt been able to urinate for a couple of hours-states hematuria and is on blood thinner

## 2016-01-21 ENCOUNTER — Encounter (HOSPITAL_COMMUNITY): Payer: Self-pay | Admitting: *Deleted

## 2016-01-21 ENCOUNTER — Emergency Department (HOSPITAL_COMMUNITY)
Admission: EM | Admit: 2016-01-21 | Discharge: 2016-01-21 | Disposition: A | Discharge status: Home / Self Care | Payer: 59 | Attending: Emergency Medicine | Admitting: Emergency Medicine

## 2016-01-21 DIAGNOSIS — M1990 Unspecified osteoarthritis, unspecified site: Secondary | ICD-10-CM | POA: Diagnosis not present

## 2016-01-21 DIAGNOSIS — Y658 Other specified misadventures during surgical and medical care: Secondary | ICD-10-CM | POA: Diagnosis not present

## 2016-01-21 DIAGNOSIS — Z960 Presence of urogenital implants: Secondary | ICD-10-CM

## 2016-01-21 DIAGNOSIS — Z87891 Personal history of nicotine dependence: Secondary | ICD-10-CM | POA: Insufficient documentation

## 2016-01-21 DIAGNOSIS — Z79899 Other long term (current) drug therapy: Secondary | ICD-10-CM | POA: Insufficient documentation

## 2016-01-21 DIAGNOSIS — Z8673 Personal history of transient ischemic attack (TIA), and cerebral infarction without residual deficits: Secondary | ICD-10-CM | POA: Insufficient documentation

## 2016-01-21 DIAGNOSIS — Z86010 Personal history of colonic polyps: Secondary | ICD-10-CM | POA: Diagnosis not present

## 2016-01-21 DIAGNOSIS — Z951 Presence of aortocoronary bypass graft: Secondary | ICD-10-CM | POA: Diagnosis not present

## 2016-01-21 DIAGNOSIS — Z9861 Coronary angioplasty status: Secondary | ICD-10-CM | POA: Insufficient documentation

## 2016-01-21 DIAGNOSIS — E669 Obesity, unspecified: Secondary | ICD-10-CM | POA: Insufficient documentation

## 2016-01-21 DIAGNOSIS — I10 Essential (primary) hypertension: Secondary | ICD-10-CM | POA: Insufficient documentation

## 2016-01-21 DIAGNOSIS — I4891 Unspecified atrial fibrillation: Secondary | ICD-10-CM | POA: Diagnosis not present

## 2016-01-21 DIAGNOSIS — T839XXA Unspecified complication of genitourinary prosthetic device, implant and graft, initial encounter: Secondary | ICD-10-CM

## 2016-01-21 DIAGNOSIS — N390 Urinary tract infection, site not specified: Secondary | ICD-10-CM | POA: Diagnosis not present

## 2016-01-21 DIAGNOSIS — I252 Old myocardial infarction: Secondary | ICD-10-CM | POA: Insufficient documentation

## 2016-01-21 DIAGNOSIS — I2510 Atherosclerotic heart disease of native coronary artery without angina pectoris: Secondary | ICD-10-CM | POA: Insufficient documentation

## 2016-01-21 DIAGNOSIS — Z8619 Personal history of other infectious and parasitic diseases: Secondary | ICD-10-CM | POA: Diagnosis not present

## 2016-01-21 DIAGNOSIS — E291 Testicular hypofunction: Secondary | ICD-10-CM | POA: Diagnosis not present

## 2016-01-21 DIAGNOSIS — H9193 Unspecified hearing loss, bilateral: Secondary | ICD-10-CM | POA: Diagnosis not present

## 2016-01-21 DIAGNOSIS — Z7901 Long term (current) use of anticoagulants: Secondary | ICD-10-CM | POA: Diagnosis not present

## 2016-01-21 DIAGNOSIS — R300 Dysuria: Secondary | ICD-10-CM | POA: Diagnosis present

## 2016-01-21 DIAGNOSIS — G4730 Sleep apnea, unspecified: Secondary | ICD-10-CM | POA: Diagnosis not present

## 2016-01-21 DIAGNOSIS — K219 Gastro-esophageal reflux disease without esophagitis: Secondary | ICD-10-CM | POA: Diagnosis not present

## 2016-01-21 DIAGNOSIS — T83098A Other mechanical complication of other indwelling urethral catheter, initial encounter: Secondary | ICD-10-CM | POA: Insufficient documentation

## 2016-01-21 DIAGNOSIS — Z8701 Personal history of pneumonia (recurrent): Secondary | ICD-10-CM | POA: Diagnosis not present

## 2016-01-21 DIAGNOSIS — Z978 Presence of other specified devices: Secondary | ICD-10-CM

## 2016-01-21 LAB — BASIC METABOLIC PANEL
ANION GAP: 9 (ref 5–15)
BUN: 31 mg/dL — ABNORMAL HIGH (ref 6–20)
CO2: 28 mmol/L (ref 22–32)
Calcium: 8.8 mg/dL — ABNORMAL LOW (ref 8.9–10.3)
Chloride: 99 mmol/L — ABNORMAL LOW (ref 101–111)
Creatinine, Ser: 2.01 mg/dL — ABNORMAL HIGH (ref 0.61–1.24)
GFR calc Af Amer: 36 mL/min — ABNORMAL LOW (ref >60)
GFR calc non Af Amer: 31 mL/min — ABNORMAL LOW (ref >60)
GLUCOSE: 87 mg/dL (ref 65–99)
POTASSIUM: 4.6 mmol/L (ref 3.5–5.1)
Sodium: 136 mmol/L (ref 135–145)

## 2016-01-21 LAB — CBC WITH DIFFERENTIAL/PLATELET
BASOS ABS: 0 10*3/uL (ref 0.0–0.1)
Basophils Relative: 0 %
Eosinophils Absolute: 0.2 10*3/uL (ref 0.0–0.7)
Eosinophils Relative: 2 %
HEMATOCRIT: 46.6 % (ref 39.0–52.0)
Hemoglobin: 14.6 g/dL (ref 13.0–17.0)
LYMPHS ABS: 1.2 10*3/uL (ref 0.7–4.0)
LYMPHS PCT: 9 %
MCH: 25.1 pg — ABNORMAL LOW (ref 26.0–34.0)
MCHC: 31.3 g/dL (ref 30.0–36.0)
MCV: 80.1 fL (ref 78.0–100.0)
MONO ABS: 1.8 10*3/uL — AB (ref 0.1–1.0)
Monocytes Relative: 14 %
NEUTROS ABS: 10 10*3/uL — AB (ref 1.7–7.7)
Neutrophils Relative %: 75 %
Platelets: 248 10*3/uL (ref 150–400)
RBC: 5.82 MIL/uL — AB (ref 4.22–5.81)
RDW: 18.5 % — ABNORMAL HIGH (ref 11.5–15.5)
WBC: 13.3 10*3/uL — AB (ref 4.0–10.5)

## 2016-01-21 LAB — URINALYSIS, ROUTINE W REFLEX MICROSCOPIC
GLUCOSE, UA: NEGATIVE mg/dL
Ketones, ur: 15 mg/dL — AB
Nitrite: POSITIVE — AB
PH: 6 (ref 5.0–8.0)
Protein, ur: 100 mg/dL — AB
SPECIFIC GRAVITY, URINE: 1.019 (ref 1.005–1.030)

## 2016-01-21 LAB — URINE MICROSCOPIC-ADD ON

## 2016-01-21 MED ORDER — CEPHALEXIN 500 MG PO CAPS
500.0000 mg | ORAL_CAPSULE | Freq: Once | ORAL | Status: AC
  Administered 2016-01-21: 500 mg via ORAL
  Filled 2016-01-21: qty 1

## 2016-01-21 MED ORDER — CEPHALEXIN 500 MG PO CAPS: 500.0000 mg | ORAL_CAPSULE | Freq: Three times a day (TID) | ORAL | Status: DC

## 2016-01-21 NOTE — ED Provider Notes (Signed)
CSN: [id number]     Arrival date & time 01/21/16  1158 History   First MD Initiated Contact with Patient 01/21/16 1256     Chief Complaint  Patient presents with  . Dysuria     (Consider location/radiation/quality/duration/timing/severity/associated sxs/prior Treatment) HPI 75 year old male who presents with difficulty urinating. He has a history of CAD, hypertension, atrial fibrillation on apixaban, prior CVA. He is status post TURP by Dr. Gaynelle Arabian on 01/15/2016. He had presented to the ED one day ago for urinary retention after Foley catheter was discontinued in the office 2 days ago. 68 French catheter was placed, and it was draining urine appropriately. He states that since he has returned home he has a sensation that his Foley catheter has been clogged. States that he has to induce significant Valsalva in order to get his clots to be dislodged and rigors urine to flow freely. Tried this just prior to presentation however without significant effect so presents to the ED for concern of clogged Foley. Denies severe abdominal pain, nausea or vomiting, fevers or chills, flank pain. Past Medical History  Diagnosis Date  . CAD (coronary artery disease)   . HTN (hypertension)   . Prostatitis   . Hyperlipidemia   . Obesity   . Diverticular disease   . Gallstones   . Arthritis   . Sleep apnea     mild, CPAP wasn't indicated (per pt)--2014 mod-severe, on CPAP since 07/2013  . Benign prostatic hypertrophy     followed by Dr. Gaynelle Arabian; regrowth R lat lob (11/2015)  . Cerebrovascular disease   . Sepsis due to Escherichia coli [facility]) ? 2011    DUE TO URINARY OUTLET OBSTRUCTION /INFECTION  . UTI (urinary tract infection)     secondary  . Allergy     RHINITIS  . GERD (gastroesophageal reflux disease)   . Hypogonadism male     treated by Dr. Gaynelle Arabian  . Erectile dysfunction   . Peripheral vascular disease ([place])     02/28/12 MOST RECENT CAROTID DUPLEX--"STABLE MILD CALCIFIED PLAQUE,  BILATERALY, STABLE, OVER SERIAL EXAMS"  . 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 Diverticulosis     seen on colonoscopy (9/05, 06/2009)  . Hyperplastic colon polyp 05/2004  . Myocardial infarction [facility]) '98, '09    (Dr. Stanford Breed); s/p stents  . Stroke [facility]) jan 2016  . Pneumonia age 88    hx of  . Shortness of breath dyspnea     with exertion only  . Hepatitis age 28    "called yelow jaundice"  . Nocturia   . Cataracts, bilateral   . Family history of adverse reaction to anesthesia     mother had memeory problems after anesthesia with hip replacement, lasted until she died 2 to 3 years later   Past Surgical History  Procedure Laterality Date  . Total knee arthroplasty  L 4/06, R 4/07  . Vasectomy    . Tonsillectomy    . Transurethral resection of prostate  06/2010  . Knee surgery  05/2010    removing scar tissue R knee (Dr. Wynelle Link)  . Joint replacement      both knees  . Total knee revision  09/23/2012    Procedure: TOTAL KNEE REVISION;  Surgeon: Gearlean Alf, MD; right side  Location: [facility];  Service: Orthopedics;  Laterality: Right;  . Intraoperative transesophageal echocardiogram N/A 04/15/2014    Procedure: INTRAOPERATIVE TRANSESOPHAGEAL ECHOCARDIOGRAM;  Surgeon: Rexene Alberts, MD;  Location: [place];  Service: Open Heart Surgery;  Laterality: N/A;  . Left and right heart catheterization with coronary angiogram  04/13/2014    Procedure: LEFT AND RIGHT HEART CATHETERIZATION WITH CORONARY ANGIOGRAM;  Surgeon: Sinclair Grooms, MD;  Location: [facility] CATH LAB;  Service: Cardiovascular;;  . Cystoscopy  11/17/15    [place]  . Coronary artery bypass graft N/A 04/15/2014    Procedure: CORONARY ARTERY BYPASS GRAFTING TIMES TWO USEING LEFT INTERNAL MAMMARY ARTERY AND RIGHT GREATER SAPHENOUS VEIN VIA ENDOVEIN HARVEST;  Surgeon: Rexene Alberts, MD;  Location: [place];  Service:  Open Heart Surgery;  Laterality: N/A;  . Coronary angioplasty with stent placement  01/1997, stent x 1, 2009 2nd stent in same location    PTCA/stent to RCA  AND 2009 2ND STENT PLACED  . Green light laser turp (transurethral resection of prostate N/A 01/15/2016    Procedure: GREEN LIGHT LASER TURP (TRANSURETHRAL RESECTION OF PROSTATE;  Surgeon: Carolan Clines, MD;  Location: [facility];  Service: Urology;  Laterality: N/A;  LASER RIGHT LATERAL LOBE OF PROSTATE AND LEFT BLADDER NECK     Family History  Problem Relation Age of Onset  . Coronary artery disease Mother     deceased  . Dementia Mother   . Atrial fibrillation Mother   . Stroke Mother     related to atrial fib  . Coronary artery disease Father     deceased  . Hypertension Father   . Heart disease Father   . Cancer Paternal Uncle     colon; died in his 22's  . Colon cancer Paternal Uncle   . Hypertension Paternal Grandmother   . Heart disease Paternal Grandmother   . Hypertension Paternal Grandfather   . Heart disease Paternal Grandfather   . Diabetes Neg Hx    Social History  Substance Use Topics  . Smoking status: Former Smoker -- 1.50 packs/day for 30 years    Types: Cigarettes    Quit date: 09/16/1986  . Smokeless tobacco: Never Used     Comment: 35 pack year history, none in 18 years  . Alcohol Use: 0.0 oz/week    0 Standard drinks or equivalent per week     Comment: 1 glass of wine three a week (previously daily)    Review of Systems 10/14 systems reviewed and are negative other than those stated in the HPI    Allergies  Review of patient's allergies indicates no known allergies.  Home Medications   Prior to Admission medications   Medication Sig Start Date End Date Taking? Authorizing Provider  acetaminophen (TYLENOL) 500 MG tablet Take 1,000 mg by mouth at bedtime as needed for mild pain.    Yes Historical Provider, MD  apixaban (ELIQUIS) 5 MG TABS tablet Take 1 tablet (5 mg total) by mouth 2 (two)  times daily. 11/14/15  Yes Lelon Perla, MD  atorvastatin (LIPITOR) 80 MG tablet Take 1 tablet (80 mg total) by mouth every evening. 09/25/15  Yes Lelon Perla, MD  CALCIUM-MAGNESIUM-ZINC PO Take 1 each by mouth daily.   Yes Historical Provider, MD  Cholecalciferol (VITAMIN D) 2000 UNITS tablet Take 4,000 Units by mouth daily.    Yes Historical Provider, MD  Coenzyme Q10 (CO Q-10) 100 MG CAPS Take 1 capsule by mouth daily.   Yes Historical Provider, MD  docusate sodium (COLACE) 100  MG capsule Take 100 mg by mouth 2 (two) times daily.   Yes Historical Provider, MD  fluticasone (FLONASE) 50 MCG/ACT nasal spray USE 2 SPRAYS NASALLY DAILY 09/14/15  Yes Rita Ohara, MD  furosemide (LASIX) 40 MG tablet Take 1 tablet (40 mg total) by mouth daily. 09/25/15  Yes Lelon Perla, MD  hydrocodone-ibuprofen (VICOPROFEN) 5-200 MG tablet Take 1 tablet by mouth every 6 (six) hours as needed for pain. May increase to 2 tabs q 6 h. Patient taking differently: Take 1 tablet by mouth every 6 (six) hours as needed for pain.  01/15/16  Yes Carolan Clines, MD  hyoscyamine (LEVSIN/SL) 0.125 MG SL tablet Place 1 tablet (0.125 mg total) under the tongue every 4 (four) hours as needed for cramping (spasms). 01/15/16  Yes Carolan Clines, MD  IRON PO Take 25 mg by mouth daily.   Yes Historical Provider, MD  loratadine (CLARITIN) 10 MG tablet Take 10 mg by mouth at bedtime.    Yes Historical Provider, MD  losartan (COZAAR) 25 MG tablet Take 1 tablet (25 mg total) by mouth daily. 09/25/15  Yes Lelon Perla, MD  magnesium citrate SOLN Take 1 Bottle by mouth once.   Yes Historical Provider, MD  metoprolol tartrate (LOPRESSOR) 25 MG tablet Take 1 tablet (25 mg total) by mouth 2 (two) times daily. 11/14/15  Yes Lelon Perla, MD  Multiple Vitamin (MULTIVITAMIN) tablet Take 1 tablet by mouth daily. Centrum Silver   Yes Historical Provider, MD  nitroGLYCERIN (NITROSTAT) 0.4 MG SL tablet Place 1 tablet (0.4 mg total) under  the tongue every 5 (five) minutes as needed for chest pain. 07/03/15  Yes Lelon Perla, MD  omeprazole (PRILOSEC) 20 MG capsule Take 20 mg by mouth every other day. At bedtime   Yes Historical Provider, MD  phenazopyridine (PYRIDIUM) 200 MG tablet Take 1 tablet (200 mg total) by mouth 3 (three) times daily as needed for pain. Will discolor urine orange Will stain clothing 01/15/16  Yes Carolan Clines, MD  senna (SENOKOT) 8.6 MG tablet Take 1 tablet by mouth daily as needed for constipation.   Yes Historical Provider, MD  spironolactone (ALDACTONE) 25 MG tablet Take 1 tablet (25 mg total) by mouth daily. 09/25/15  Yes Lelon Perla, MD  testosterone cypionate (DEPOTESTOTERONE CYPIONATE) 200 MG/ML injection Inject 200 mg into the muscle every 14 (fourteen) days.  01/12/14  Yes Historical Provider, MD  trimethoprim (TRIMPEX) 100 MG tablet Take 1 tablet (100 mg total) by mouth daily. Patient taking differently: Take 100 mg by mouth daily. 2pm 01/15/16  Yes Carolan Clines, MD  cephALEXin (KEFLEX) 500 MG capsule Take 1 capsule (500 mg total) by mouth 3 (three) times daily. 01/21/16   Forde Dandy, MD   BP 106/83 mmHg  Pulse 81  Temp(Src) 98.8 F (37.1 C) (Oral)  Resp 18  SpO2 97% Physical Exam Physical Exam  Nursing note and vitals reviewed. Constitutional: Well developed, well nourished, non-toxic, and in no acute distress Head: Normocephalic and atraumatic.  Mouth/Throat: Oropharynx is clear and moist.  Neck: Normal range of motion. Neck supple.  Cardiovascular: Normal rate and regular rhythm.   Pulmonary/Chest: Effort normal and breath sounds normal.  Abdominal: Soft. There is no tenderness. There is no rebound and no guarding.  GU: urinary catheter in place, dark maroon blood in the foley bag Musculoskeletal: Normal range of motion.  Neurological: Alert, no facial droop, fluent speech, moves all extremities symmetrically Skin: Skin is warm and dry.  Psychiatric:  Cooperative  ED  Course  Procedures (including critical care time) Labs Review Labs Reviewed  CBC WITH DIFFERENTIAL/PLATELET - Abnormal; Notable for the following:    WBC 13.3 (*)    RBC 5.82 (*)    MCH 25.1 (*)    RDW 18.5 (*)    Neutro Abs 10.0 (*)    Monocytes Absolute 1.8 (*)    All other components within normal limits  BASIC METABOLIC PANEL - Abnormal; Notable for the following:    Chloride 99 (*)    BUN 31 (*)    Creatinine, Ser 2.01 (*)    Calcium 8.8 (*)    GFR calc non Af Amer 31 (*)    GFR calc Af Amer 36 (*)    All other components within normal limits  URINALYSIS, ROUTINE W REFLEX MICROSCOPIC (NOT AT [facility]) - Abnormal; Notable for the following:    Color, Urine RED (*)    APPearance CLOUDY (*)    Hgb urine dipstick LARGE (*)    Bilirubin Urine LARGE (*)    Ketones, ur 15 (*)    Protein, ur 100 (*)    Nitrite POSITIVE (*)    Leukocytes, UA LARGE (*)    All other components within normal limits  URINE MICROSCOPIC-ADD ON - Abnormal; Notable for the following:    Squamous Epithelial / LPF 0-5 (*)    Bacteria, UA FEW (*)    All other components within normal limits  URINE CULTURE    Imaging Review No results found. I have personally reviewed and evaluated these images and lab results as part of my medical decision-making.   EKG Interpretation None      MDM   Final diagnoses:  Foley catheter problem, initial encounter ([place])  Foley catheter in place  UTI (lower urinary tract infection)    75 year old male who presents with concern for recurrent urinary retention in the setting of a Foley catheter placement. On presentation is nontoxic in no acute distress with normal vital signs. I he is a soft and benign abdomen. Foley appears to be in place without significant retention on bladder scan at bedside ultrasound. Foley catheter was flushed at bedside, with expression of significant blood clots. He subsequently we have drainage of urine, and he reports that his symptoms had  resolved on knee feels back to his normal self. He is stable hemoglobin. He has slightly bumped creatinine from 1.85 yesterday to 2.0 today. His UA does suggest infection and sent for culture. Initiated on Keflex in setting of foley catheter associated UTI.  Spoke with Dr. Carlota Raspberry from urology who was comfortable having the patient call urology clinic tomorrow for close one-day follow-up. We have taught patient how to irrigate his bladder at home, and provide him supplies. He and his wife felt comfortable with discharge and self flushing Foley with plan to follow-up with his urologist tomorrow. Strict return instructions are reviewed. He expressed understanding of all discharge instructions, and felt comfortable with the plan of care.   Forde Dandy, MD 01/21/16 [phone]

## 2016-01-21 NOTE — ED Notes (Signed)
Cath ? Blocked, Bladder scan shows no urine in bladder, pt states it will block off then open again.

## 2016-01-21 NOTE — ED Notes (Signed)
Informed by Tech that pt reports discomfort in bladder and that catheter doesn't appear to be draining.

## 2016-01-21 NOTE — Discharge Instructions (Signed)
Your kidney function is slightly elevated in setting of your urinary retention. Your red blood cell count is stable. Continue eliquis as prescribed. Take antibiotics as prescribed for UTI  Call Dr. Arlyn Leak office tomorrow to set up same-day clinic appointment if possible for close follow-up. If you feel that your foley is clogged, attempt to flush her Foley as instructed today in the ED. If you have persistent concern that your Foley is not working, or have any worsening symptoms such as worsening pain, vomiting unable to keep down food or fluids, fever, or any other symptoms concerning to you please return for reevaluation.   Catheter-Associated Urinary Tract Infection FAQs What is "catheter-associated urinary tract infection"? A urinary tract infection (also called "UTI") is an infection in the urinary system, which includes the bladder (which stores the urine) and the kidneys (which filter the blood to make urine). Germs (for example, bacteria or yeasts) do not normally live in these areas; but if germs are introduced, an infection can occur. If you have a urinary catheter, germs can travel along the catheter and cause an infection in your bladder or your kidney; in that case it is called a catheter-associated urinary tract infection (or "CA-UTI").  What is a urinary catheter? A urinary catheter is a thin tube placed in the bladder to drain urine. Urine drains through the tube into a bag that collects the urine. A urinary catheter may be used:  If you are not able to urinate on your own  To measure the amount of urine that you make, for example, during intensive care  During and after some types of surgery  During some tests of the kidneys and bladder People with urinary catheters have a much higher chance of getting a urinary tract infection than people who don't have a catheter. How do I get a catheter-associated urinary tract infection (CA-UTI)? If germs enter the urinary tract, they  may cause an infection. Many of the germs that cause a catheter-associated urinary tract infection are common germs found in your intestines that do not usually cause an infection there. Germs can enter the urinary tract when the catheter is being put in or while the catheter remains in the bladder.  What are the symptoms of a urinary tract infection? Some of the common symptoms of a urinary tract infection are:  Burning or pain in the lower abdomen (that is, below the stomach)  Fever  Bloody urine may be a sign of infection, but is also caused by other problems  Burning during urination or an increase in the frequency of urination after the catheter is removed. Sometimes people with catheter-associated urinary tract infections do not have these symptoms of infection. Can catheter-associated urinary tract infections be treated? Yes, most catheter-associated urinary tract infections can be treated with antibiotics and removal or change of the catheter. Your doctor will determine which antibiotic is best for you.  What are some of the things that hospitals are doing to prevent catheter-associated urinary tract infections? To prevent urinary tract infections, doctors and nurses take the following actions.  Catheter insertion  External catheters in men (these look like condoms and are placed over the penis rather than into the penis)  Putting a temporary catheter in to drain the urine and removing it right away. This is called intermittent urethral catheterization. Catheter care What can I do to help prevent catheter-associated urinary tract infections if I have a catheter?  Always clean your hands before and after doing catheter care.  Always keep your urine bag below the level of your bladder.  Do not tug or pull on the tubing.  Do not twist or kink the catheter tubing.  Ask your healthcare provider each day if you still need the catheter. What do I need to do when I go home from the  hospital?  If you will be going home with a catheter, your doctor or nurse should explain everything you need to know about taking care of the catheter. Make sure you understand how to care for it before you leave the hospital.  If you develop any of the symptoms of a urinary tract infection, such as burning or pain in the lower abdomen, fever, or an increase in the frequency of urination, contact your doctor or nurse immediately.  Before you go home, make sure you know who to contact if you have questions or problems after you get home. If you have questions, please ask your doctor or nurse. Developed and co-sponsored by [employer] for [place] [phone]); Infectious Diseases Society of [place] (IDSA); [place]; Association for Professionals in Infection Control and Epidemiology (APIC); Centers for Disease Control and Prevention (CDC); and [employer].   This information is not intended to replace advice given to you by your health care provider. Make sure you discuss any questions you have with your health care provider.   Document Released: 05/27/2012 Document Revised: 01/17/2015 Document Reviewed: 11/16/2014 Elsevier Interactive Patient Education [employer].

## 2016-01-21 NOTE — ED Notes (Signed)
Per Dr. Oleta Mouse request, bladder was irrigated with about 367ml of NaCl was irrigated and returned plus pt's urine.  A blood clot was also retrieved.  Irrigation continued until urine appeared to be amber in color.  Pt reports relief of pressure in bladder.  Dr. Oleta Mouse made aware.

## 2016-01-21 NOTE — ED Notes (Signed)
Pt and SO given instructions and supplies to irrigate pt's catheter.  Instructions on when to come back to ED were also given.  Both the pt and So expressed understanding of instructions and felt comfortable performing bladder irrigation at home. Dr. Oleta Mouse made aware.

## 2016-01-21 NOTE — ED Notes (Signed)
Bladder Scan performed indicating 25ml residual urine in bladder

## 2016-01-23 LAB — URINE CULTURE: Culture: NO GROWTH

## 2016-01-24 ENCOUNTER — Telehealth: Payer: Self-pay | Admitting: Pharmacist

## 2016-01-24 NOTE — Telephone Encounter (Signed)
New message      Talk to Jeffrey Wiggins.  He has had some medical events that has happened within the last week.  He want to discuss them with you prior to his appt

## 2016-01-24 NOTE — Telephone Encounter (Signed)
LMOM for pt to return call. 

## 2016-01-25 NOTE — Telephone Encounter (Signed)
Pt called to report he had a TURP on 5/1 and has some complications with urinary retention over the past week.  He wanted to make sure this did not interfere with starting Tikosyn next week.  Pt is on Eliquis.  He did not miss any doses throughout this process.  His SCr did double at last ER check.  If still elevated on Monday, may consider delaying Tikosyn start if he is unable to use 558mcg dose.  Pt is agreeable to this plan.

## 2016-01-29 ENCOUNTER — Inpatient Hospital Stay (HOSPITAL_COMMUNITY)
Admission: AD | Admit: 2016-01-29 | Discharge: 2016-02-01 | DRG: 309 | Disposition: A | Discharge status: Home / Self Care | Payer: 59 | Source: Ambulatory Visit | Attending: Internal Medicine | Admitting: Internal Medicine

## 2016-01-29 ENCOUNTER — Encounter (HOSPITAL_COMMUNITY): Payer: Self-pay | Admitting: General Practice

## 2016-01-29 ENCOUNTER — Ambulatory Visit (INDEPENDENT_AMBULATORY_CARE_PROVIDER_SITE_OTHER): Payer: 59 | Admitting: Pharmacist

## 2016-01-29 DIAGNOSIS — Z5181 Encounter for therapeutic drug level monitoring: Secondary | ICD-10-CM | POA: Diagnosis not present

## 2016-01-29 DIAGNOSIS — G4733 Obstructive sleep apnea (adult) (pediatric): Secondary | ICD-10-CM | POA: Diagnosis present

## 2016-01-29 DIAGNOSIS — I2510 Atherosclerotic heart disease of native coronary artery without angina pectoris: Secondary | ICD-10-CM | POA: Diagnosis present

## 2016-01-29 DIAGNOSIS — Z8673 Personal history of transient ischemic attack (TIA), and cerebral infarction without residual deficits: Secondary | ICD-10-CM | POA: Diagnosis not present

## 2016-01-29 DIAGNOSIS — Z951 Presence of aortocoronary bypass graft: Secondary | ICD-10-CM | POA: Diagnosis not present

## 2016-01-29 DIAGNOSIS — E669 Obesity, unspecified: Secondary | ICD-10-CM | POA: Diagnosis present

## 2016-01-29 DIAGNOSIS — I429 Cardiomyopathy, unspecified: Secondary | ICD-10-CM | POA: Diagnosis not present

## 2016-01-29 DIAGNOSIS — Z87891 Personal history of nicotine dependence: Secondary | ICD-10-CM

## 2016-01-29 DIAGNOSIS — I5022 Chronic systolic (congestive) heart failure: Secondary | ICD-10-CM | POA: Diagnosis present

## 2016-01-29 DIAGNOSIS — Z955 Presence of coronary angioplasty implant and graft: Secondary | ICD-10-CM

## 2016-01-29 DIAGNOSIS — Z96653 Presence of artificial knee joint, bilateral: Secondary | ICD-10-CM | POA: Diagnosis present

## 2016-01-29 DIAGNOSIS — I480 Paroxysmal atrial fibrillation: Secondary | ICD-10-CM | POA: Diagnosis not present

## 2016-01-29 DIAGNOSIS — I252 Old myocardial infarction: Secondary | ICD-10-CM | POA: Diagnosis not present

## 2016-01-29 DIAGNOSIS — K219 Gastro-esophageal reflux disease without esophagitis: Secondary | ICD-10-CM | POA: Diagnosis present

## 2016-01-29 DIAGNOSIS — Z7901 Long term (current) use of anticoagulants: Secondary | ICD-10-CM | POA: Diagnosis not present

## 2016-01-29 DIAGNOSIS — Z823 Family history of stroke: Secondary | ICD-10-CM | POA: Diagnosis not present

## 2016-01-29 DIAGNOSIS — Z86010 Personal history of colonic polyps: Secondary | ICD-10-CM

## 2016-01-29 DIAGNOSIS — I255 Ischemic cardiomyopathy: Secondary | ICD-10-CM | POA: Diagnosis present

## 2016-01-29 DIAGNOSIS — I110 Hypertensive heart disease with heart failure: Secondary | ICD-10-CM | POA: Diagnosis present

## 2016-01-29 DIAGNOSIS — H9193 Unspecified hearing loss, bilateral: Secondary | ICD-10-CM | POA: Diagnosis present

## 2016-01-29 DIAGNOSIS — E875 Hyperkalemia: Secondary | ICD-10-CM | POA: Diagnosis not present

## 2016-01-29 DIAGNOSIS — Z79899 Other long term (current) drug therapy: Secondary | ICD-10-CM | POA: Diagnosis not present

## 2016-01-29 DIAGNOSIS — I481 Persistent atrial fibrillation: Principal | ICD-10-CM | POA: Diagnosis present

## 2016-01-29 DIAGNOSIS — N400 Enlarged prostate without lower urinary tract symptoms: Secondary | ICD-10-CM | POA: Diagnosis present

## 2016-01-29 DIAGNOSIS — Z974 Presence of external hearing-aid: Secondary | ICD-10-CM

## 2016-01-29 DIAGNOSIS — E785 Hyperlipidemia, unspecified: Secondary | ICD-10-CM | POA: Diagnosis present

## 2016-01-29 DIAGNOSIS — I4891 Unspecified atrial fibrillation: Secondary | ICD-10-CM | POA: Diagnosis not present

## 2016-01-29 DIAGNOSIS — Z8249 Family history of ischemic heart disease and other diseases of the circulatory system: Secondary | ICD-10-CM | POA: Diagnosis not present

## 2016-01-29 HISTORY — DX: Encounter for therapeutic drug level monitoring: Z51.81

## 2016-01-29 HISTORY — DX: Unspecified atrial fibrillation: I48.91

## 2016-01-29 HISTORY — DX: Other long term (current) drug therapy: Z79.899

## 2016-01-29 LAB — MAGNESIUM: Magnesium: 1.9 mg/dL (ref 1.7–2.4)

## 2016-01-29 LAB — BASIC METABOLIC PANEL
ANION GAP: 9 (ref 5–15)
BUN: 13 mg/dL (ref 6–20)
CALCIUM: 9.8 mg/dL (ref 8.9–10.3)
CO2: 29 mmol/L (ref 22–32)
Chloride: 101 mmol/L (ref 101–111)
Creatinine, Ser: 1.09 mg/dL (ref 0.61–1.24)
Glucose, Bld: 94 mg/dL (ref 65–99)
Potassium: 5.3 mmol/L — ABNORMAL HIGH (ref 3.5–5.1)
SODIUM: 139 mmol/L (ref 135–145)

## 2016-01-29 MED ORDER — DOFETILIDE 250 MCG PO CAPS
500.0000 ug | ORAL_CAPSULE | Freq: Two times a day (BID) | ORAL | Status: DC
  Administered 2016-01-29 – 2016-02-01 (×6): 500 ug via ORAL
  Filled 2016-01-29 (×6): qty 2

## 2016-01-29 MED ORDER — APIXABAN 5 MG PO TABS
5.0000 mg | ORAL_TABLET | Freq: Two times a day (BID) | ORAL | Status: DC
  Administered 2016-01-29 – 2016-02-01 (×6): 5 mg via ORAL
  Filled 2016-01-29 (×6): qty 1

## 2016-01-29 MED ORDER — PANTOPRAZOLE SODIUM 40 MG PO TBEC
40.0000 mg | DELAYED_RELEASE_TABLET | Freq: Every day | ORAL | Status: DC
  Administered 2016-01-30 – 2016-02-01 (×2): 40 mg via ORAL
  Filled 2016-01-29 (×2): qty 1

## 2016-01-29 MED ORDER — FUROSEMIDE 10 MG/ML IJ SOLN
40.0000 mg | Freq: Once | INTRAMUSCULAR | Status: AC
  Administered 2016-01-30: 40 mg via INTRAVENOUS
  Filled 2016-01-29: qty 4

## 2016-01-29 MED ORDER — SODIUM CHLORIDE 0.9 % IV SOLN: 250.0000 mL | INTRAVENOUS | Status: DC | PRN

## 2016-01-29 MED ORDER — LORATADINE 10 MG PO TABS
10.0000 mg | ORAL_TABLET | Freq: Every day | ORAL | Status: DC
  Administered 2016-01-30 – 2016-01-31 (×2): 10 mg via ORAL
  Filled 2016-01-29 (×2): qty 1

## 2016-01-29 MED ORDER — SODIUM CHLORIDE 0.9% FLUSH
3.0000 mL | Freq: Two times a day (BID) | INTRAVENOUS | Status: DC
  Administered 2016-01-29 – 2016-02-01 (×4): 3 mL via INTRAVENOUS

## 2016-01-29 MED ORDER — FLUTICASONE PROPIONATE 50 MCG/ACT NA SUSP
2.0000 | Freq: Every day | NASAL | Status: DC
Start: 2016-01-29 — End: 2016-02-01
  Administered 2016-01-30 – 2016-01-31 (×2): 2 via NASAL
  Filled 2016-01-29: qty 16

## 2016-01-29 MED ORDER — ATORVASTATIN CALCIUM 80 MG PO TABS
80.0000 mg | ORAL_TABLET | Freq: Every evening | ORAL | Status: DC
  Administered 2016-01-29 – 2016-01-31 (×3): 80 mg via ORAL
  Filled 2016-01-29 (×3): qty 1

## 2016-01-29 MED ORDER — SODIUM CHLORIDE 0.9% FLUSH: 3.0000 mL | INTRAVENOUS | Status: DC | PRN

## 2016-01-29 MED ORDER — CEPHALEXIN 500 MG PO CAPS
500.0000 mg | ORAL_CAPSULE | Freq: Three times a day (TID) | ORAL | Status: AC
  Administered 2016-01-29 – 2016-01-31 (×7): 500 mg via ORAL
  Filled 2016-01-29 (×7): qty 1

## 2016-01-29 MED ORDER — METOPROLOL TARTRATE 25 MG PO TABS
25.0000 mg | ORAL_TABLET | Freq: Two times a day (BID) | ORAL | Status: DC
  Administered 2016-01-29 – 2016-02-01 (×6): 25 mg via ORAL
  Filled 2016-01-29 (×6): qty 1

## 2016-01-29 NOTE — Progress Notes (Signed)
Pharmacy Review for Dofetilide (Tikosyn) Initiation  Admit Complaint: 75 y.o. male admitted 01/29/2016 with atrial fibrillation to be initiated on dofetilide.   Assessment:  Patient Exclusion Criteria: If any screening criteria checked as "Yes", then  patient  should NOT receive dofetilide until criteria item is corrected. If "Yes" please indicate correction plan.  YES  NO Patient  Exclusion Criteria Correction Plan  []  [x]  Baseline QTc interval is greater than or equal to 440 msec. IF above YES box checked dofetilide contraindicated unless patient has ICD; then may proceed if QTc 500-550 msec or with known ventricular conduction abnormalities may proceed with QTc 550-600 msec. QTc =  413, preliminary   []  [x]  Magnesium level is less than 1.8 mEq/l : Last magnesium:  Lab Results  Component Value Date   MG 1.9 01/29/2016         []  [x]  Potassium level is less than 4 mEq/l : Last potassium:  Lab Results  Component Value Date   K 5.3* 01/29/2016        Holding Spironolactone and Losartan  []  [x]  Patient is known or suspected to have a digoxin level greater than 2 ng/ml: No results found for: DIGOXIN    []  [x]  Creatinine clearance less than 20 ml/min (calculated using Cockcroft-Gault, actual body weight and serum creatinine): Estimated Creatinine Clearance: 74.3 mL/min (by C-G formula based on Cr of 1.09).    Calculated for total body weight = ~100 ml/min  []  [x]  Patient has received drugs known to prolong the QT intervals within the last 48 hours (phenothiazines, tricyclics or tetracyclic antidepressants, erythromycin, H-1 antihistamines, cisapride, fluoroquinolones, azithromycin). Drugs not listed above may have an, as yet, undetected potential to prolong the QT interval, updated information on QT prolonging agents is available at this website:QT prolonging agents  On Loratadine daily for several years. Loratadine does not flag as contraindicated on crediblemeds.org web site.  []   [x]  Patient received a dose of hydrochlorothiazide (Oretic) alone or in any combination including triamterene (Dyazide, Maxzide) in the last 48 hours.   []  [x]  Patient received a medication known to increase dofetilide plasma concentrations prior to initial dofetilide dose:  . Trimethoprim (Primsol, Proloprim) in the last 36 hours . Verapamil (Calan, Verelan) in the last 36 hours or a sustained release dose in the last 72 hours . Megestrol (Megace) in the last 5 days  . Cimetidine (Tagamet) in the last 6 hours . Ketoconazole (Nizoral) in the last 24 hours . Itraconazole (Sporanox) in the last 48 hours  . Prochlorperazine (Compazine) in the last 36 hours    []  [x]  Patient is known to have a history of torsades de pointes; congenital or acquired long QT syndromes.   []  [x]  Patient has received a Class 1 antiarrhythmic with less than 2 half-lives since last dose. (Disopyramide, Quinidine, Procainamide, Lidocaine, Mexiletine, Flecainide, Propafenone)   []  [x]  Patient has received amiodarone therapy in the past 3 months or amiodarone level is greater than 0.3 ng/ml.    Patient has been appropriately anticoagulated with Eilquis.  Ordering provider was confirmed at [email or web address] if they are not listed on the [place] Prescribers list.  Goal of Therapy: Follow renal function, electrolytes, potential drug interactions, and dose adjustment. Provide education and 1 week supply at discharge.  Plan:  [x]   Physician selected initial dose within range recommended for patients level of renal function - will monitor for response.  []   Physician selected initial dose outside of range recommended for patients level of renal function -  will discuss if the dose should be altered at this time.   Select One Calculated CrCl  Dose q12h  [x]  > 60 ml/min 500 mcg  []  40-60 ml/min 250 mcg  []  20-40 ml/min 125 mcg   2. Follow up QTc after the first 5 doses, renal function, electrolytes (K & Mg) daily  x 3 days, dose adjustment, success of initiation and facilitate 1 week discharge supply as  clinically indicated.  3. Initiate Tikosyn education video (Call [phone] and ask for video # 116).  4. Recent TURP (01/15/16) with post-op hematuria and voiding issues.  Scr was up to 2.01 on 5/7 but back to 1.09 today.  He reports that he did not skip any Eliquis doses while having hematuria post-TURP. Hematuria resolved.  Arty Baumgartner , [place] Pager: [phone]  7:25 PM 01/29/2016

## 2016-01-29 NOTE — Progress Notes (Signed)
HPI:   Jeffrey Wiggins is a 75 yo M patient of Dr. Stanford Breed.  He has a PMH significant for atrial fibrillation, coronary artery disease, hypertension, and hyperlipidemia. He was admitted 1/16 with a flutter and new CVA. He was originally treated with rate control and anticoagulation.  He converted to SR without the need for cardioversion at some point between January and April 2016.  He remained in [place] until January 2017.  He was seen in Afib clinic on 11/22/15 to discuss antiarrhythmic options and arrangement of Tikosyn.  Pt preferred to wait until after his TURP procedure on 5/1 so plans were made for admission today.    Pt is accompanied today by his wife.  Pt informed of potential side effects of Tikosyn, including QTc prolongation.  He is aware of the importance of compliance and will inform the office if he misses more than 2 doses in a row.  He has been appropriately anticoagulated and not missed any doses of Eliquis in the past 4 weeks.  He is currently not taking any QTc prolongating or contraindicated medications.  Of note, he did have to go to ER on 2 separate occassions after his TURP for hematuria and issues with voiding.  His SCr at baseline is ~1 and had increased to 2.01 on 5/7.    EKG reviewed by Dr. Rayann Heman.  Atrial fibrillation with vent rate of 74 bpm.  QTc 386 msec.   Current Outpatient Prescriptions  Medication Sig Dispense Refill  . acetaminophen (TYLENOL) 500 MG tablet Take 1,000 mg by mouth at bedtime as needed for mild pain.     Marland Kitchen apixaban (ELIQUIS) 5 MG TABS tablet Take 1 tablet (5 mg total) by mouth 2 (two) times daily. 180 tablet 3  . atorvastatin (LIPITOR) 80 MG tablet Take 1 tablet (80 mg total) by mouth every evening. 90 tablet 3  . CALCIUM-MAGNESIUM-ZINC PO Take 1 each by mouth daily.    . cephALEXin (KEFLEX) 500 MG capsule Take 1 capsule (500 mg total) by mouth 3 (three) times daily. 29 capsule 0  . Cholecalciferol (VITAMIN D) 2000 UNITS tablet Take 4,000 Units by  mouth daily.     . Coenzyme Q10 (CO Q-10) 100 MG CAPS Take 1 capsule by mouth daily.    . fluticasone (FLONASE) 50 MCG/ACT nasal spray USE 2 SPRAYS NASALLY DAILY 48 g 0  . furosemide (LASIX) 40 MG tablet Take 1 tablet (40 mg total) by mouth daily. 90 tablet 3  . IRON PO Take 25 mg by mouth daily.    Marland Kitchen loratadine (CLARITIN) 10 MG tablet Take 10 mg by mouth at bedtime.     Marland Kitchen losartan (COZAAR) 25 MG tablet Take 1 tablet (25 mg total) by mouth daily. 90 tablet 3  . metoprolol tartrate (LOPRESSOR) 25 MG tablet Take 1 tablet (25 mg total) by mouth 2 (two) times daily. 180 tablet 3  . Multiple Vitamin (MULTIVITAMIN) tablet Take 1 tablet by mouth daily. Centrum Silver    . nitroGLYCERIN (NITROSTAT) 0.4 MG SL tablet Place 1 tablet (0.4 mg total) under the tongue every 5 (five) minutes as needed for chest pain. 25 tablet 3  . omeprazole (PRILOSEC) 20 MG capsule Take 20 mg by mouth every other day. At bedtime    . spironolactone (ALDACTONE) 25 MG tablet Take 1 tablet (25 mg total) by mouth daily. 90 tablet 3  . testosterone cypionate (DEPOTESTOTERONE CYPIONATE) 200 MG/ML injection Inject 200 mg into the muscle every 14 (fourteen) days.  No current facility-administered medications for this visit.     Past Medical History  Diagnosis Date  . CAD (coronary artery disease)   . HTN (hypertension)   . Prostatitis   . Hyperlipidemia   . Obesity   . Diverticular disease   . Gallstones   . Arthritis   . Sleep apnea     mild, CPAP wasn't indicated (per pt)--2014 mod-severe, on CPAP since 07/2013  . Benign prostatic hypertrophy     followed by Dr. Gaynelle Arabian; regrowth R lat lob (11/2015)  . Cerebrovascular disease   . Sepsis due to Escherichia coli [facility]) ? 2011    DUE TO URINARY OUTLET OBSTRUCTION /INFECTION  . UTI (urinary tract infection)     secondary  . Allergy     RHINITIS  . GERD (gastroesophageal reflux disease)   . Hypogonadism male     treated by Dr. Gaynelle Arabian  . Erectile dysfunction    . Peripheral vascular disease ([place])     02/28/12 MOST RECENT CAROTID DUPLEX--"STABLE MILD CALCIFIED PLAQUE, BILATERALY, STABLE, OVER SERIAL EXAMS"  . 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 Diverticulosis     seen on colonoscopy (9/05, 06/2009)  . Hyperplastic colon polyp 05/2004  . Myocardial infarction [facility]) '98, '09    (Dr. Stanford Breed); s/p stents  . Stroke [facility]) jan 2016  . Pneumonia age 69    hx of  . Shortness of breath dyspnea     with exertion only  . Hepatitis age 47    "called yelow jaundice"  . Nocturia   . Cataracts, bilateral   . Family history of adverse reaction to anesthesia     mother had memeory problems after anesthesia with hip replacement, lasted until she died 2 to 3 years later   Assessment and Plan 1. Atrial fibrillation-  Reviewed pt's lab.  K- 5.3, Mg, 1.9 and QTc < 46msec.  Okay to start Tikosyn.  Pt's SCr also normalized back to 1.09.  CrCl> 100 mg/dL.  Okay to start Tikosyn 562mcg BID.  Pt aware to report to hospital for admission.

## 2016-01-29 NOTE — H&P (Addendum)
ELECTROPHYSIOLOGY CONSULT NOTE    Patient ID: Jeffrey Wiggins MRN: [id number], DOB/AGE: May 29, 1941 75 y.o.  Admit date: 01/29/2016 Date of Admit: 01/29/2016   Primary Physician: [place] Ports, MD Primary Cardiologist: Jeffrey Wiggins  Reason for Admit: Tikosyn initiation  HPI: Jeffrey Wiggins is a 75 y.o. male who was referred to [facility] for Tikosyn initiation.  He was seen by Jeffrey Wiggins, The today had teaching adn counseling and wanted to proceed with Tikosyn. The patient was referred to the AF clinic by Jeffrey Wiggins for evaluation and evaluation for Tikosyn, Jeffrey Wiggins, Jeffrey Wiggins saw him had a repeat echo and subsequently planned for Tikosyn initiation.  He has a PMH significant for atrial fibrillation, coronary artery disease (hx of CABG), hypertension, and hyperlipidemia. He was admitted 1/16 with a flutter and new CVA. He was originally treated with rate control and anticoagulation. He converted to SR without the need for cardioversion at some point between January and April 2016. He remained in [place] until January 2017. He was seen in Afib clinic on 11/22/15 to discuss antiarrhythmic options and arrangement of Tikosyn. Pt preferred to wait until after his TURP procedure on 5/1 so plans were made for admission today. He had post procedure urinary retention and bump in his Creat that has since improved.  The patient assures [place] he has not missed any doses of his Eliquis in > 4 weeks, including the time surrounding his urology procedure. He is edematous but knowing he was going to be out/about did not take his lasix, he has some edema at baseline that will increase as the day goes. He denies any overt SOB, but knows he is in AF because when exercising his exertional tolerance goes down and his HR tends to be faster.  Outside of decreased energy with the AF, he is feeling well, no CP since his CABG a couple years ago.   Past Medical History  Diagnosis Date  . CAD (coronary artery disease)   . HTN  (hypertension)   . Prostatitis   . Hyperlipidemia   . Obesity   . Diverticular disease   . Gallstones   . Arthritis   . Sleep apnea     mild, CPAP wasn't indicated (per pt)--2014 mod-severe, on CPAP since 07/2013  . Benign prostatic hypertrophy     followed by Jeffrey Wiggins; regrowth R lat lob (11/2015)  . Cerebrovascular disease   . Sepsis due to Escherichia coli [facility]) ? 2011    DUE TO URINARY OUTLET OBSTRUCTION /INFECTION  . UTI (urinary tract infection)     secondary  . Allergy     RHINITIS  . GERD (gastroesophageal reflux disease)   . Hypogonadism male     treated by Jeffrey Wiggins  . Erectile dysfunction   . Peripheral vascular disease ([place])     02/28/12 MOST RECENT CAROTID DUPLEX--"STABLE MILD CALCIFIED PLAQUE, BILATERALY, STABLE, OVER SERIAL EXAMS"  . 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 Diverticulosis     seen on colonoscopy (9/05, 06/2009)  . Hyperplastic colon polyp 05/2004  . Myocardial infarction [facility]) '98, '09    (Jeffrey Wiggins); s/p stents  . Stroke [facility]) jan 2016  . Pneumonia age 10    hx of  . Shortness of breath dyspnea     with exertion only  . Hepatitis  age 76    "called yelow jaundice"  . Nocturia   . Cataracts, bilateral   . Family history of adverse reaction to anesthesia     mother had memeory problems after anesthesia with hip replacement, lasted until she died 2 to 3 years later  . Atrial fibrillation ([place])   . Visit for monitoring Tikosyn therapy      Surgical History:  Past Surgical History  Procedure Laterality Date  . Total knee arthroplasty  L 4/06, R 4/07  . Vasectomy    . Tonsillectomy    . Transurethral resection of prostate  06/2010  . Knee surgery  05/2010    removing scar tissue R knee (Dr. Wynelle Wiggins)  . Joint replacement      both knees  . Total knee revision  09/23/2012    Procedure: TOTAL KNEE REVISION;  Surgeon: [place] Alf, MD; right side  Location: [facility];  Service: Orthopedics;  Laterality: Right;  . Intraoperative transesophageal echocardiogram N/A 04/15/2014    Procedure: INTRAOPERATIVE TRANSESOPHAGEAL ECHOCARDIOGRAM;  Surgeon: [place] Alberts, MD;  Location: [place];  Service: Open Heart Surgery;  Laterality: N/A;  . Left and right heart catheterization with coronary angiogram  04/13/2014    Procedure: LEFT AND RIGHT HEART CATHETERIZATION WITH CORONARY ANGIOGRAM;  Surgeon: [place] Grooms, MD;  Location: [facility] CATH LAB;  Service: Cardiovascular;;  . Cystoscopy  11/17/15    Jeffrey Wiggins  . Coronary artery bypass graft N/A 04/15/2014    Procedure: CORONARY ARTERY BYPASS GRAFTING TIMES TWO USEING LEFT INTERNAL MAMMARY ARTERY AND RIGHT GREATER SAPHENOUS VEIN VIA ENDOVEIN HARVEST;  Surgeon: [place] Alberts, MD;  Location: [place];  Service: Open Heart Surgery;  Laterality: N/A;  . Coronary angioplasty with stent placement  01/1997, stent x 1, 2009 2nd stent in same location    PTCA/stent to RCA  AND 2009 2ND STENT PLACED  . Green light laser turp (transurethral resection of prostate N/A 01/15/2016    Procedure: GREEN LIGHT LASER TURP (TRANSURETHRAL RESECTION OF PROSTATE;  Surgeon: [place] Clines, MD;  Location: [facility];  Service: Urology;  Laterality: N/A;  LASER RIGHT LATERAL LOBE OF PROSTATE AND LEFT BLADDER NECK       Prescriptions prior to admission  Medication Sig Dispense Refill Last Dose  . acetaminophen (TYLENOL) 500 MG tablet Take 1,000 mg by mouth at bedtime as needed for mild pain.    01/20/2016 at Unknown time  . apixaban (ELIQUIS) 5 MG TABS tablet Take 1 tablet (5 mg total) by mouth 2 (two) times daily. 180 tablet 3 01/21/2016 at Unknown time  . atorvastatin (LIPITOR) 80 MG tablet Take 1 tablet (80 mg total) by mouth every evening. 90 tablet 3 01/20/2016 at Unknown time  . CALCIUM-MAGNESIUM-ZINC PO Take 1 each by mouth daily.   01/20/2016 at Unknown time  . cephALEXin (KEFLEX) 500 MG capsule Take 1 capsule  (500 mg total) by mouth 3 (three) times daily. 29 capsule 0   . Cholecalciferol (VITAMIN D) 2000 UNITS tablet Take 4,000 Units by mouth daily.    01/20/2016 at Unknown time  . Coenzyme Q10 (CO Q-10) 100 MG CAPS Take 1 capsule by mouth daily.   01/20/2016 at Unknown time  . fluticasone (FLONASE) 50 MCG/ACT nasal spray USE 2 SPRAYS NASALLY DAILY 48 g 0 01/20/2016 at Unknown time  . furosemide (LASIX) 40 MG tablet Take 1 tablet (40 mg total) by mouth daily. 90 tablet 3 01/20/2016 at Unknown time  . IRON PO Take 25 mg by  mouth daily.   01/20/2016 at Unknown time  . loratadine (CLARITIN) 10 MG tablet Take 10 mg by mouth at bedtime.    01/20/2016 at Unknown time  . losartan (COZAAR) 25 MG tablet Take 1 tablet (25 mg total) by mouth daily. 90 tablet 3 01/21/2016 at Unknown time  . metoprolol tartrate (LOPRESSOR) 25 MG tablet Take 1 tablet (25 mg total) by mouth 2 (two) times daily. 180 tablet 3 01/21/2016 at 0815  . Multiple Vitamin (MULTIVITAMIN) tablet Take 1 tablet by mouth daily. Centrum Silver   01/20/2016 at Unknown time  . nitroGLYCERIN (NITROSTAT) 0.4 MG SL tablet Place 1 tablet (0.4 mg total) under the tongue every 5 (five) minutes as needed for chest pain. 25 tablet 3 prn  . omeprazole (PRILOSEC) 20 MG capsule Take 20 mg by mouth every other day. At bedtime   01/20/2016 at Unknown time  . spironolactone (ALDACTONE) 25 MG tablet Take 1 tablet (25 mg total) by mouth daily. 90 tablet 3 01/20/2016 at Unknown time  . testosterone cypionate (DEPOTESTOTERONE CYPIONATE) 200 MG/ML injection Inject 200 mg into the muscle every 14 (fourteen) days.    Past Month at Unknown time    Inpatient Medications:   Allergies: No Known Allergies  Social History   Social History  . Marital Status: Married    Spouse Name: N/A  . Number of Children: 4  . Years of Education: N/A   Occupational History  . CPA    Social History Main Topics  . Smoking status: Former Smoker -- 1.50 packs/day for 30 years    Types: Cigarettes     Quit date: 09/16/1986  . Smokeless tobacco: Never Used     Comment: 35 pack year history, none in 18 years  . Alcohol Use: 0.0 oz/week    0 Standard drinks or equivalent per week     Comment: 1 glass of wine three a week (previously daily)  . Drug Use: No  . Sexual Activity: Not Currently   Other Topics Concern  . Not on file   Social History Narrative   Lives with wife.  4 kids, 1 stepchild (1 in [place], 4 in [place]). No pets. Wife is a diabetic     Family History  Problem Relation Age of Onset  . Coronary artery disease Mother     deceased  . Dementia Mother   . Atrial fibrillation Mother   . Stroke Mother     related to atrial fib  . Coronary artery disease Father     deceased  . Hypertension Father   . Heart disease Father   . Cancer Paternal Uncle     colon; died in his 1's  . Colon cancer Paternal Uncle   . Hypertension Paternal Grandmother   . Heart disease Paternal Grandmother   . Hypertension Paternal Grandfather   . Heart disease Paternal Grandfather   . Diabetes Neg Hx      Review of Systems: All other systems reviewed and are otherwise negative except as noted above.  Physical Exam: Filed Vitals:   01/29/16 1424  BP: 120/68  Pulse: 93  Temp: 99.2 F (37.3 C)  TempSrc: Oral  Resp: 17  Weight: 268 lb 3.2 oz (121.655 kg)  SpO2: 95%    GEN- The patient is well appearing, alert and oriented x 3 today.   HEENT: normocephalic, atraumatic; sclera clear, conjunctiva pink; hearing intact; oropharynx clear; neck supple, + JVP Lymph- no cervical lymphadenopathy Lungs- Clear to ausculation bilaterally, normal work of breathing.  No wheezes, rales, rhonchi Heart-irregular rate and rhythm, no murmurs, rubs or gallops, PMI not laterally displaced GI- soft, non-tender, non-distended, bowel sounds present Extremities- no clubbing, cyanosis, 2++ edema MS- no significant deformity or atrophy Skin- warm and dry, no rash or lesion Psych- euthymic mood, full  affect Neuro- no gross deficits observed  Labs:   Lab Results  Component Value Date   WBC 13.3* 01/21/2016   HGB 14.6 01/21/2016   HCT 46.6 01/21/2016   MCV 80.1 01/21/2016   PLT 248 01/21/2016    Recent Labs Lab 01/29/16 1003  NA 139  K 5.3*  CL 101  CO2 29  BUN 13  CREATININE 1.09  CALCIUM 9.8  GLUCOSE 94       EKG: Afib, 87bpm TELEMETRY: AFib, 80's 11/24/15: Echocardiogram Study Conclusions - Left ventricle: The cavity size was normal. Wall thickness was  increased in a pattern of mild LVH. Systolic function was mildly  reduced. The estimated ejection fraction was in the range of 45%  to 50%. Diffuse hypokinesis. Indeterminant diastolic function  (atrial fibrillation). - Aortic valve: Mild to moderately stenosed visually but no doppler  done. - Mitral valve: Mildly to moderately calcified annulus. Mildly  calcified leaflets . There was trivial regurgitation. - Left atrium: The atrium was mildly dilated. (48mm) - Right ventricle: The cavity size was mildly dilated. Systolic  function was mildly reduced. - Right atrium: The atrium was mildly dilated. - Pulmonary arteries: PA peak pressure: 31 mm Hg (S). - Inferior vena cava: The vessel was normal in size. The  respirophasic diameter changes were in the normal range (= 50%),  consistent with normal central venous pressure. Impressions: - Technically difficult study with poor acoustic windows. Echo  contrast may have helped but was not used. Normal LV size with EF  45-50%, diffuse hypokinesis. Mildly dilated RV with mildly  decreased systolic function. Visually, there was mild to moderate  aortic stenosis but doppler interrogation of the valve was not  done.  09/25/14: Echocardioogram Study Conclusions - Left ventricle: The cavity size was normal. Wall thickness was normal. Systolic function was mildly to moderately reduced. The estimated ejection fraction was in the range of 40% to  45%. Diffuse hypokinesis. The study is not technically sufficient to allow evaluation of LV diastolic function. - Aortic valve: Right coronary cusp mobility was severely restricted. - Mitral valve: There was mild regurgitation. - Left atrium: The atrium was severely dilated. - Right ventricle: The cavity size was mildly dilated. Systolic function was moderately reduced. - Right atrium: The atrium was moderately to severely dilated. - Atrial septum: No defect or patent foramen ovale was identified Aortic valve:  Trileaflet; mildly thickened, mildly calcified leaflets. Right coronary cusp mobility was severely restricted. Sclerosis without stenosis. Doppler: There was no significant Regurgitation.   Assessment and Plan:   1. Persistent Afib, Tikosyn initiation     CHA2DS2Vasc is at least 6 on Eliquis     He reports not missing a single dose in > 4 weeks     K+ 5.3     Mag 1.9     Creat 1.09 (Calc Cr. Cl is 102.23) will start 599mcg BID     EKG is reviewed with Dr. Caryl Comes, stable to start Tikosyn  2. Mild  ICM     He did not take his lasix today and is edematous, + JVD,  though not SOB     will give him IV lasix tomorrow AM to avoid frequent urination tonight  He reports having mild edema at baseline that worsens at the end of the day  3. Hyperkalemia     Hold his aldactone and cozaar for now and see where his K+ is in AM  4. HTN     Stable  Signed, Tommye Standard, PA-C 01/29/2016 4:54 PM  Patient with symptomatic atrial fibrillation modest left ventricular dysfunction (EF 45-50%) and moderate LAE (49/1.9/36) admitted for dofetilide.  He has had transient renal insufficiency which has normalized this occurring in the context of urological procedures.  He has treated sleep apnea.  He exercises regularly with his obesity.  Physical examination as above is notable for volume overload. We will give him IV Lasix in the morning.  We have reviewed risks and  benefits of dofetilide  He is hyperkalemic; we will repeat his metabolic profile in the morning and potentially resume Aldactone and Cozaar both of which are now on hold.  Anticipate  cardioversion on Wednesday if he does not convert spontaneously

## 2016-01-29 NOTE — Progress Notes (Addendum)
Renee with EP notified that new admit is here.  She has a few pts to see prior to this one but will be around later this afternoon.  EKG obtained per verbal order.  Pt assessment completed, admission nurse notified of new admit. IV started.

## 2016-01-30 DIAGNOSIS — Z79899 Other long term (current) drug therapy: Secondary | ICD-10-CM

## 2016-01-30 DIAGNOSIS — Z5181 Encounter for therapeutic drug level monitoring: Secondary | ICD-10-CM

## 2016-01-30 LAB — BASIC METABOLIC PANEL
ANION GAP: 11 (ref 5–15)
BUN: 14 mg/dL (ref 6–20)
CALCIUM: 9.6 mg/dL (ref 8.9–10.3)
CO2: 30 mmol/L (ref 22–32)
Chloride: 101 mmol/L (ref 101–111)
Creatinine, Ser: 1.18 mg/dL (ref 0.61–1.24)
GFR calc Af Amer: >60 mL/min (ref >60)
GFR, EST NON AFRICAN AMERICAN: 59 mL/min — AB (ref >60)
GLUCOSE: 95 mg/dL (ref 65–99)
POTASSIUM: 4.7 mmol/L (ref 3.5–5.1)
SODIUM: 142 mmol/L (ref 135–145)

## 2016-01-30 LAB — MAGNESIUM: MAGNESIUM: 1.9 mg/dL (ref 1.7–2.4)

## 2016-01-30 MED ORDER — SODIUM CHLORIDE 0.9% FLUSH
3.0000 mL | INTRAVENOUS | Status: DC | PRN
Start: 2016-01-30 — End: 2016-02-01

## 2016-01-30 MED ORDER — SODIUM CHLORIDE 0.9% FLUSH
3.0000 mL | Freq: Two times a day (BID) | INTRAVENOUS | Status: DC
  Administered 2016-01-30 – 2016-02-01 (×3): 3 mL via INTRAVENOUS

## 2016-01-30 MED ORDER — SODIUM CHLORIDE 0.9 % IV SOLN: 250.0000 mL | INTRAVENOUS | Status: DC

## 2016-01-30 MED ORDER — HYDROCORTISONE 1 % EX CREA
1.0000 "application " | TOPICAL_CREAM | Freq: Three times a day (TID) | CUTANEOUS | Status: DC | PRN
  Filled 2016-01-30: qty 28

## 2016-01-30 NOTE — Progress Notes (Signed)
Patient sitting up in chair, no needs at this time. Call light within reach. 

## 2016-01-30 NOTE — Progress Notes (Signed)
    SUBJECTIVE: The patient is doing well today.  At this time, he denies chest pain, shortness of breath, or any new concerns.  Ambulating in the room.  Marland Kitchen apixaban  5 mg Oral BID  . atorvastatin  80 mg Oral QPM  . cephALEXin  500 mg Oral TID  . dofetilide  500 mcg Oral BID  . fluticasone  2 spray Each Nare QHS  . loratadine  10 mg Oral QHS  . metoprolol tartrate  25 mg Oral BID  . pantoprazole  40 mg Oral QAC breakfast  . sodium chloride flush  3 mL Intravenous Q12H      OBJECTIVE: Physical Exam: Filed Vitals:   01/29/16 1424 01/29/16 2023 01/30/16 0618  BP: 120/68 125/59 140/70  Pulse: 93 85 74  Temp: 99.2 F (37.3 C) 98.8 F (37.1 C) 98.3 F (36.8 C)  TempSrc: Oral Oral Oral  Resp: 17 17   Weight: 268 lb 3.2 oz (121.655 kg)    SpO2: 95% 96% 98%    Intake/Output Summary (Last 24 hours) at 01/30/16 0812 Last data filed at 01/30/16 0643  Gross per 24 hour  Intake      0 ml  Output   1850 ml  Net  -1850 ml    Telemetry reveals AF, 90's  GEN- The patient is well appearing, alert and oriented x 3 today.   Head- normocephalic, atraumatic Eyes-  Sclera clear, conjunctiva pink Ears- hearing intact Oropharynx- clear Neck- supple, no JVP Lungs- Clear to ausculation bilaterally, normal work of breathing Heart- Irregular rate and rhythm, no significant murmurs, no rubs or gallops GI- soft, NT, ND Extremities- no clubbing, cyanosis, or edema Skin- no rash or lesion Psych- euthymic mood, full affect Neuro- no gross deficits appreciated  LABS: Basic Metabolic Panel:  Recent Labs  01/29/16 1003 01/30/16 0425  NA 139 142  K 5.3* 4.7  CL 101 101  CO2 29 30  GLUCOSE 94 95  BUN 13 14  CREATININE 1.09 1.18  CALCIUM 9.8 9.6  MG 1.9 1.9     ASSESSMENT AND PLAN:  Active Problems:   Visit for monitoring Tikosyn therapy 1. Persistent Afib, Tikosyn initiation  CHA2DS2Vasc is at least 6 on Eliquis  He reports not missing a single dose in > 4 weeks  K+  4.7  Mag 1.9  Creat 1.18 (Calc Cr. Cl is 94.43) Scarlett Portlock continue 515mcg BID  EKG is reviewed by Dr. Curt Bears, stable to continue Tikosyn  2. Mild ICM  IV lasix this AM  resume his homoe lasix tomorrow  He reports having mild edema at baseline that worsens at the end of the day  3. Hyperkalemia  resolved     Resume home cozaar and aldactone tomorrow if K+ remains wnl  4. HTN  Stable  5. OSA     Reports compliance with CPAP  Tommye Standard, PA-C 01/30/2016 8:12 AM   I have seen and examined this patient with Tommye Standard.  Agree with above, note added to reflect my findings.  On exam, irregular rhythm, no murmurs, lungs clear.  Currently on tikosyn load.  Plan for continued dose at 500 mcg.  Susan Arana continue to monitor ECG and electrolytes.  No changes in [place] today.    Alyx Gee M. Mahala Rommel MD 01/30/2016 9:54 PM

## 2016-01-30 NOTE — Progress Notes (Signed)
Utilization review completed.  

## 2016-01-30 NOTE — Progress Notes (Signed)
Apixaban Validation Study (ClinicalTrials.gov Identifier: [id number]) RESEARCH SUBJECT. Purpose: Obtain fresh samples that will be used to assess the performances of STA-Apixaban Calibrator and STA-Apixaban Control in combination with the STA-Liquid Anti-Xa to determine the quantity of apixaban in plasma samples by measurement of its direct anti-Xa activity. Apixaban Validation Study is sponsored by [employer].The fresh samples will collected by completing a one time blood draw on approved patients.   Inclusion Criteria: weight </= 60kg, >/= 75 years, Hct <39% for male, < 36% for male, renal impairment, co-medication with ASA/NSAIDs, and/or co-medication with anti-platelet agents.  Protocol Title:  Apixaban Validation Study Informed Consent   Subject Name: Jeffrey Wiggins  This patient, Jeffrey Wiggins, has been consented to the above clinical trial according to FDA regulations, GCP guidelines and [employer]'s SOPs. The informed consent form and study design have been explained to this patient by this study coordinator. The patient demonstrated comprehension of this clinical trial and study requirements/expectations. No study procedures have been initiated before consenting of this patient. The patient was given sufficient time for reading the consent form. All risks, benefits and options have been thoroughly discussed and all questions were answered per the patient's satisfaction. This patient was not coerced in any way to participate in this clinical trial. This patient has voluntarily signed consent version one at 1535 pm on 01/30/2016. A copy of the signed consent form was given to the patient and a copy was placed in the subject's medical record.  Sedalia Bing, [place], Research Assistant Office [phone]

## 2016-01-31 ENCOUNTER — Encounter (HOSPITAL_COMMUNITY)
Admission: AD | Disposition: A | Discharge status: Home / Self Care | Payer: Self-pay | Source: Ambulatory Visit | Attending: Internal Medicine

## 2016-01-31 LAB — BASIC METABOLIC PANEL
Anion gap: 12 (ref 5–15)
BUN: 16 mg/dL (ref 6–20)
CALCIUM: 9.4 mg/dL (ref 8.9–10.3)
CO2: 30 mmol/L (ref 22–32)
CREATININE: 1.18 mg/dL (ref 0.61–1.24)
Chloride: 100 mmol/L — ABNORMAL LOW (ref 101–111)
GFR calc Af Amer: >60 mL/min (ref >60)
GFR calc non Af Amer: 59 mL/min — ABNORMAL LOW (ref >60)
GLUCOSE: 106 mg/dL — AB (ref 65–99)
Potassium: 5 mmol/L (ref 3.5–5.1)
Sodium: 142 mmol/L (ref 135–145)

## 2016-01-31 LAB — MAGNESIUM: Magnesium: 2 mg/dL (ref 1.7–2.4)

## 2016-01-31 SURGERY — CARDIOVERSION (CATH LAB)

## 2016-01-31 MED ORDER — FUROSEMIDE 40 MG PO TABS
40.0000 mg | ORAL_TABLET | Freq: Every day | ORAL | Status: DC
  Administered 2016-01-31 – 2016-02-01 (×2): 40 mg via ORAL
  Filled 2016-01-31 (×2): qty 1

## 2016-01-31 NOTE — Care Management Note (Signed)
Case Management Note Marvetta Gibbons RN, BSN Unit 2W-Case Manager [phone]  Patient Details  Name: Jeffrey Wiggins MRN: [id number] Date of Birth: October 03, 1940  Subjective/Objective:    Pt admitted with afib for Tikosyn load                Action/Plan: PTA pt lived at home with wife- plan to return home with wife- referral received for Tikosyn assistance- insurance check completed- pt has coverage with [employer] [phone]) Help line #- [phone]  Tikosyn Brand Name covered- 500 mcg- at $113.54/mo Or $310/mail order No pre-auth needed Generic Dofetilide covered- 500 mcg- at $25/mo or $50/mail order  Spoke with pt at bedside- coverage info shared - per pt [facility] uses [facility] in [place] for [facility] fills - uses [facility] mail order for most drugs- [facility] can order drug in stock once script received. Pt will need 7 day supply on discharge from [facility]- MD please provide script for 7 day supply.  Expected Discharge Date:                  Expected Discharge Plan:  Home/Self Care  In-House Referral:     Discharge planning Services  CM Consult, Medication Assistance  Post Acute Care Choice:    Choice offered to:     DME Arranged:    DME Agency:     HH Arranged:    HH Agency:     Status of Service:  In process, will continue to follow  Medicare Important Message Given:    Date Medicare IM Given:    Medicare IM give by:    Date Additional Medicare IM Given:    Additional Medicare Important Message give by:     If discussed at [place] of Stay Meetings, dates discussed:    Additional Comments:  Dawayne Patricia, RN 01/31/2016, 3:41 PM

## 2016-01-31 NOTE — Progress Notes (Signed)
SUBJECTIVE: The patient is doing well today.  At this time, he denies chest pain, shortness of breath, or any new concerns.  CURRENT MEDICATIONS: . apixaban  5 mg Oral BID  . atorvastatin  80 mg Oral QPM  . cephALEXin  500 mg Oral TID  . dofetilide  500 mcg Oral BID  . fluticasone  2 spray Each Nare QHS  . furosemide  40 mg Oral Daily  . loratadine  10 mg Oral QHS  . metoprolol tartrate  25 mg Oral BID  . pantoprazole  40 mg Oral QAC breakfast  . sodium chloride flush  3 mL Intravenous Q12H  . sodium chloride flush  3 mL Intravenous Q12H   . sodium chloride      OBJECTIVE: Physical Exam: Filed Vitals:   01/30/16 0618 01/30/16 1512 01/30/16 2003 01/31/16 0546  BP: 140/70 146/80 136/68 139/72  Pulse: 74 65 65 72  Temp: 98.3 F (36.8 C) 98.4 F (36.9 C) 97.9 F (36.6 C) 97.9 F (36.6 C)  TempSrc: Oral Oral Oral Oral  Resp:  18 18   Weight:      SpO2: 98% 97% 95% 96%    Intake/Output Summary (Last 24 hours) at 01/31/16 0726 Last data filed at 01/30/16 2241  Gross per 24 hour  Intake    483 ml  Output   1800 ml  Net  -1317 ml    Telemetry reveals sinus rhythm with PVC's   GEN- The patient is well appearing, alert and oriented x 3 today.   Head- normocephalic, atraumatic Eyes-  Sclera clear, conjunctiva pink Ears- hearing intact Oropharynx- clear Neck- supple  Lungs- Clear to ausculation bilaterally, normal work of breathing Heart- Regular rate and rhythm, no murmurs, rubs or gallops  GI- soft, NT, ND, + BS Extremities- no clubbing, cyanosis, or edema Skin- no rash or lesion Psych- euthymic mood, full affect Neuro- strength and sensation are intact  LABS: Basic Metabolic Panel:  Recent Labs  01/30/16 0425 01/31/16 0320  NA 142 142  K 4.7 5.0  CL 101 100*  CO2 30 30  GLUCOSE 95 106*  BUN 14 16  CREATININE 1.18 1.18  CALCIUM 9.6 9.4  MG 1.9 2.0    RADIOLOGY: Dg Chest Port 1 View 01/15/2016  CLINICAL DATA:  Preop for TURP EXAM: PORTABLE CHEST  1 VIEW COMPARISON:  07/18/2014 FINDINGS: Borderline cardiomegaly. Status post median sternotomy. No acute infiltrate or pleural effusion. No pulmonary edema. Mild degenerative changes thoracic spine. IMPRESSION: Borderline cardiomegaly. Status post median sternotomy. Mild degenerative changes thoracic spine. Electronically Signed   By: Lahoma Crocker M.D.   On: 01/15/2016 08:21    ASSESSMENT AND PLAN:  Active Problems:   Visit for monitoring Tikosyn therapy  1.  Persistent atrial fibrillation Admitted 5/15 for Tikosyn load Converted to SR Continue Tikosyn 536mcg twice daily (PVC's were present during AF as well) QTc, Mg stable. K 5 this morning, continue to hold spironolactone, ARB Continue Eliquis for CHADS2VASC of 6  2.  Chronic systolic heart failure Resume home dose of furosemide 40mg  daily Euvolemic on exam   3.  HTN Stable No change required today  4.  OSA Compliance with CPAP encouraged  5.  Hyperkalemia Continue to hold ARB, spironolactone Resume home Lasix dose   Anticipate discharge tomorrow. Aulani Shipton need 1 week and 4 week follow up in AF clinic.  Chanetta Marshall, NP 01/31/2016 7:26 AM     I have seen and examined this patient with Chanetta Marshall.  Agree  with above, note added to reflect my findings.  On exam, regular rhythm, no murmurs, lungs clear.  Converted to sinus rhythm after 3rd dose of tikosyn.  Stable QTc on 50 mcg.  K elevated this AM, hold aldactone and ARB, restart lasix.  Continue Eliquis for CHADS2VASc of 6.  Plan for discharge tomorrow if remains stable in sinus rhythm.    Malva Diesing M. Ninoshka Wainwright MD 01/31/2016 8:04 AM

## 2016-01-31 NOTE — Progress Notes (Signed)
Insurance check completed for Tikosyn- pt has coverage with [employer] [phone]) Help line #- [phone]  Tikosyn Brand Name covered- 500 mcg- at $113.54/mo  Or  $310/mail order No pre-auth needed  Generic Dofetilide covered- 500 mcg- at $25/mo or $50/mail order

## 2016-02-01 DIAGNOSIS — I480 Paroxysmal atrial fibrillation: Secondary | ICD-10-CM

## 2016-02-01 LAB — BASIC METABOLIC PANEL WITH GFR
Anion gap: 10 (ref 5–15)
BUN: 16 mg/dL (ref 6–20)
CO2: 30 mmol/L (ref 22–32)
Calcium: 9.3 mg/dL (ref 8.9–10.3)
Chloride: 100 mmol/L — ABNORMAL LOW (ref 101–111)
Creatinine, Ser: 1.2 mg/dL (ref 0.61–1.24)
GFR calc Af Amer: >60 mL/min
GFR calc non Af Amer: 58 mL/min — ABNORMAL LOW
Glucose, Bld: 105 mg/dL — ABNORMAL HIGH (ref 65–99)
Potassium: 5 mmol/L (ref 3.5–5.1)
Sodium: 140 mmol/L (ref 135–145)

## 2016-02-01 LAB — MAGNESIUM: Magnesium: 2 mg/dL (ref 1.7–2.4)

## 2016-02-01 MED ORDER — DOFETILIDE 500 MCG PO CAPS: 500.0000 ug | ORAL_CAPSULE | Freq: Two times a day (BID) | ORAL | Status: DC

## 2016-02-01 NOTE — Progress Notes (Signed)
Jeffrey Wiggins to be D/C'd Home per MD order. Discussed with the patient and all questions fully answered.    VVS, Skin clean, dry and intact without evidence of skin break down, no evidence of skin tears noted.  IV catheter discontinued intact. Site without signs and symptoms of complications. Dressing and pressure applied.  An After Visit Summary was printed and given to the patient.  Patient escorted via [place], and D/C home via private auto.  [name]  02/01/2016 12:52 PM

## 2016-02-01 NOTE — Discharge Summary (Signed)
ELECTROPHYSIOLOGY PROCEDURE DISCHARGE SUMMARY    Patient ID: Jeffrey Wiggins,  MRN: [id number], DOB/AGE: April 17, 1941 75 y.o.  Admit date: 01/29/2016 Discharge date: 02/01/2016  Primary Care Physician: [name], MD Primary Cardiologist: Stanford Breed  Primary Discharge Diagnosis:  1.  Persistent atrial fibrillation status post Tikosyn loading this admission  Secondary Discharge Diagnosis:  1.  CAD s/p CABG 2.  HTN 3.  Obesity 4.  OSA 5.  BPH 6.  Hyperlipidemia   No Known Allergies   Procedures This Admission:  1.  Tikosyn loading  Brief HPI: Jeffrey Wiggins is a 75 y.o. male with a past medical history as noted above.  They were referred to EP in the outpatient setting for treatment options of atrial fibrillation.  Risks, benefits, and alternatives to Tikosyn were reviewed with the patient who wished to proceed.    Hospital Course:  The patient was admitted and Tikosyn was initiated.  Renal function and electrolytes were followed during the hospitalization.  Their QTc remained stable. He converted to SR after 3rd dose of tikosyn.  Because of hyperkalemia, his Spironolactone and ARB were held. They were monitored until discharge on telemetry which demonstrated sinus rhythm.  On the day of discharge, they were examined by Dr Lovena Le who considered them stable for discharge to home.  Follow-up has been arranged with Dr Stanford Breed in 1 week.   Physical Exam: Filed Vitals:   01/31/16 0546 01/31/16 1416 01/31/16 2131 02/01/16 0340  BP: 139/72 129/65 134/61 123/68  Pulse: 72 64 63 66  Temp: 97.9 F (36.6 C)  98.3 F (36.8 C) 97.8 F (36.6 C)  TempSrc: Oral  Oral Oral  Resp:  20 16 15   Weight:    265 lb 4.8 oz (120.339 kg)  SpO2: 96% 97% 97% 97%    GEN- The patient is well appearing, alert and oriented x 3 today.   HEENT: normocephalic, atraumatic; sclera clear, conjunctiva pink; hearing intact; oropharynx clear; neck supple  Lungs- Clear to ausculation bilaterally, normal work  of breathing.  No wheezes, rales, rhonchi Heart- Regular rate and rhythm GI- soft, non-tender, non-distended, bowel sounds present  Extremities- no clubbing, cyanosis, or edema  MS- no significant deformity or atrophy Skin- warm and dry, no rash or lesion Psych- euthymic mood, full affect Neuro- strength and sensation are intact   Labs:   Lab Results  Component Value Date   WBC 13.3* 01/21/2016   HGB 14.6 01/21/2016   HCT 46.6 01/21/2016   MCV 80.1 01/21/2016   PLT 248 01/21/2016     Recent Labs Lab 02/01/16 0316  NA 140  K 5.0  CL 100*  CO2 30  BUN 16  CREATININE 1.20  CALCIUM 9.3  GLUCOSE 105*     Discharge Medications:    Medication List    STOP taking these medications        losartan 25 MG tablet  Commonly known as:  COZAAR     spironolactone 25 MG tablet  Commonly known as:  ALDACTONE      TAKE these medications        acetaminophen 500 MG tablet  Commonly known as:  TYLENOL  Take 1,000 mg by mouth at bedtime as needed for mild pain.     apixaban 5 MG Tabs tablet  Commonly known as:  ELIQUIS  Take 1 tablet (5 mg total) by mouth 2 (two) times daily.     atorvastatin 80 MG tablet  Commonly known as:  LIPITOR  Take 1  tablet (80 mg total) by mouth every evening.     CALCIUM-MAGNESIUM-ZINC PO  Take 1 tablet by mouth at bedtime.     cephALEXin 500 MG capsule  Commonly known as:  KEFLEX  Take 1 capsule (500 mg total) by mouth 3 (three) times daily.     Co Q-10 100 MG Caps  Take 100 mg by mouth at bedtime.     dofetilide 500 MCG capsule  Commonly known as:  TIKOSYN  Take 1 capsule (500 mcg total) by mouth 2 (two) times daily.     fluticasone 50 MCG/ACT nasal spray  Commonly known as:  FLONASE  USE 2 SPRAYS NASALLY DAILY     furosemide 40 MG tablet  Commonly known as:  LASIX  Take 1 tablet (40 mg total) by mouth daily.     GENTLE IRON PO  Take 1 capsule by mouth at bedtime.     loratadine 10 MG tablet  Commonly known as:  CLARITIN    Take 10 mg by mouth at bedtime.     metoprolol tartrate 25 MG tablet  Commonly known as:  LOPRESSOR  Take 1 tablet (25 mg total) by mouth 2 (two) times daily.     multivitamin with minerals Tabs tablet  Take 1 tablet by mouth daily. Centrum silver     nitroGLYCERIN 0.4 MG SL tablet  Commonly known as:  NITROSTAT  Place 1 tablet (0.4 mg total) under the tongue every 5 (five) minutes as needed for chest pain.     omeprazole 20 MG capsule  Commonly known as:  PRILOSEC  Take 20 mg by mouth every other day.     testosterone cypionate 200 MG/ML injection  Commonly known as:  DEPOTESTOSTERONE CYPIONATE  Inject 200 mg into the muscle every 14 (fourteen) days.     Vitamin D 2000 units tablet  Take 4,000 Units by mouth at bedtime.        Disposition:  Discharge Instructions    Diet - low sodium heart healthy    Complete by:  As directed      Increase activity slowly    Complete by:  As directed           Follow-up Information    Follow up with Kirk Ruths, MD On 02/08/2016.   Specialty:  Cardiology   Why:  at 2:15PM    Contact information:   [address] [place] [address] [phone]       Duration of Discharge Encounter: Greater than 30 minutes including physician time.  Signed, Chanetta Marshall, NP 02/01/2016 9:19 AM   EP Attending  Patient seen and examined. Agree with above. His ECG is ok and he is maintaining nSR and stable for dc home with usual followup.  Mikle Bosworth.D.

## 2016-02-07 NOTE — Progress Notes (Signed)
HPI: FU coronary artery disease, hypertension, and hyperlipidemia. Cath 7/15 with severe CAD and EF 50. He underwent bypass grafting x2 with LIMA to the LAD and vein graft to the first obtuse marginal branch of the left circumflex. Abd ultrasound 9/15 showed no aneurysm. Admitted 1/16 with a flutter and new CVA. Treated with apixaban. Carotid dopplers 8/16 showed 40-59 right and 1-39 left. FU recommended 2 years. Patient recently presented in January and electrocardiogram showed recurrent atrial fibrillation. Echocardiogram March 2017 showed ejection fraction 45-50%, mild left atrial enlargement, mild right ventricular enlargement and mild right atrial enlargement. Aortic valve abnormal but not dopplered. Recently admitted and placed on tikosyn.  Since last seen, He has dyspnea with more vigorous activities and mild pedal edema. No chest pain, palpitations, syncope or bleeding.  Current Outpatient Prescriptions  Medication Sig Dispense Refill  . acetaminophen (TYLENOL) 500 MG tablet Take 1,000 mg by mouth at bedtime as needed for mild pain.     Marland Kitchen apixaban (ELIQUIS) 5 MG TABS tablet Take 1 tablet (5 mg total) by mouth 2 (two) times daily. 180 tablet 3  . atorvastatin (LIPITOR) 80 MG tablet Take 1 tablet (80 mg total) by mouth every evening. (Patient taking differently: Take 80 mg by mouth at bedtime. ) 90 tablet 3  . CALCIUM-MAGNESIUM-ZINC PO Take 1 tablet by mouth at bedtime.     . Cholecalciferol (VITAMIN D) 2000 UNITS tablet Take 4,000 Units by mouth at bedtime.     . Coenzyme Q10 (CO Q-10) 100 MG CAPS Take 100 mg by mouth at bedtime.     . dofetilide (TIKOSYN) 500 MCG capsule Take 1 capsule (500 mcg total) by mouth 2 (two) times daily. 180 capsule 2  . Fe Bisgly-Vit C-Vit B12-FA (GENTLE IRON PO) Take 1 capsule by mouth at bedtime.    . fluticasone (FLONASE) 50 MCG/ACT nasal spray USE 2 SPRAYS NASALLY DAILY (Patient taking differently: USE 2 SPRAYS NASALLY DAILY AT BEDTIME) 48 g 0  .  furosemide (LASIX) 40 MG tablet Take 1 tablet (40 mg total) by mouth daily. 90 tablet 3  . loratadine (CLARITIN) 10 MG tablet Take 10 mg by mouth at bedtime.     . metoprolol tartrate (LOPRESSOR) 25 MG tablet Take 1 tablet (25 mg total) by mouth 2 (two) times daily. 180 tablet 3  . Multiple Vitamin (MULTIVITAMIN WITH MINERALS) TABS tablet Take 1 tablet by mouth daily. Centrum silver    . nitroGLYCERIN (NITROSTAT) 0.4 MG SL tablet Place 1 tablet (0.4 mg total) under the tongue every 5 (five) minutes as needed for chest pain. 25 tablet 3  . omeprazole (PRILOSEC) 20 MG capsule Take 20 mg by mouth every other day.     . testosterone cypionate (DEPOTESTOTERONE CYPIONATE) 200 MG/ML injection Inject 200 mg into the muscle every 14 (fourteen) days.      No current facility-administered medications for this visit.     Past Medical History  Diagnosis Date  . CAD (coronary artery disease)   . HTN (hypertension)   . Prostatitis   . Hyperlipidemia   . Obesity   . Diverticular disease   . Gallstones   . Arthritis   . Sleep apnea     mild, CPAP wasn't indicated (per pt)--2014 mod-severe, on CPAP since 07/2013  . Benign prostatic hypertrophy     followed by Dr. Gaynelle Arabian; regrowth R lat lob (11/2015)  . Cerebrovascular disease   . Sepsis due to Escherichia coli [facility]) ? 2011    DUE  TO URINARY OUTLET OBSTRUCTION /INFECTION  . UTI (urinary tract infection)     secondary  . Allergy     RHINITIS  . GERD (gastroesophageal reflux disease)   . Hypogonadism male     treated by Dr. Gaynelle Arabian  . Erectile dysfunction   . Peripheral vascular disease ([place])     02/28/12 MOST RECENT CAROTID DUPLEX--"STABLE MILD CALCIFIED PLAQUE, BILATERALY, STABLE, OVER SERIAL EXAMS"  . 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 Diverticulosis     seen on colonoscopy (9/05, 06/2009)  . Hyperplastic colon polyp 05/2004    . Myocardial infarction [facility]) '98, '09    (Dr. Stanford Breed); s/p stents  . Stroke [facility]) jan 2016  . Pneumonia age 31    hx of  . Shortness of breath dyspnea     with exertion only  . Hepatitis age 6    "called yelow jaundice"  . Nocturia   . Cataracts, bilateral   . Family history of adverse reaction to anesthesia     mother had memeory problems after anesthesia with hip replacement, lasted until she died 2 to 3 years later  . Atrial fibrillation ([place])   . Visit for monitoring Tikosyn therapy     Past Surgical History  Procedure Laterality Date  . Total knee arthroplasty  L 4/06, R 4/07  . Vasectomy    . Tonsillectomy    . Transurethral resection of prostate  06/2010  . Knee surgery  05/2010    removing scar tissue R knee (Dr. Wynelle Link)  . Joint replacement      both knees  . Total knee revision  09/23/2012    Procedure: TOTAL KNEE REVISION;  Surgeon: Gearlean Alf, MD; right side  Location: [facility];  Service: Orthopedics;  Laterality: Right;  . Intraoperative transesophageal echocardiogram N/A 04/15/2014    Procedure: INTRAOPERATIVE TRANSESOPHAGEAL ECHOCARDIOGRAM;  Surgeon: Rexene Alberts, MD;  Location: [place];  Service: Open Heart Surgery;  Laterality: N/A;  . Left and right heart catheterization with coronary angiogram  04/13/2014    Procedure: LEFT AND RIGHT HEART CATHETERIZATION WITH CORONARY ANGIOGRAM;  Surgeon: Sinclair Grooms, MD;  Location: [facility] CATH LAB;  Service: Cardiovascular;;  . Cystoscopy  11/17/15    [place]  . Coronary artery bypass graft N/A 04/15/2014    Procedure: CORONARY ARTERY BYPASS GRAFTING TIMES TWO USEING LEFT INTERNAL MAMMARY ARTERY AND RIGHT GREATER SAPHENOUS VEIN VIA ENDOVEIN HARVEST;  Surgeon: Rexene Alberts, MD;  Location: [place];  Service: Open Heart Surgery;  Laterality: N/A;  . Coronary angioplasty with stent placement  01/1997, stent x 1, 2009 2nd stent in same location    PTCA/stent to RCA  AND 2009 2ND STENT PLACED  . Green light laser turp  (transurethral resection of prostate N/A 01/15/2016    Procedure: GREEN LIGHT LASER TURP (TRANSURETHRAL RESECTION OF PROSTATE;  Surgeon: Carolan Clines, MD;  Location: [facility];  Service: Urology;  Laterality: N/A;  LASER RIGHT LATERAL LOBE OF PROSTATE AND LEFT BLADDER NECK      Social History   Social History  . Marital Status: Married    Spouse Name: N/A  . Number of Children: 4  . Years of Education: N/A   Occupational History  . CPA    Social History Main Topics  . Smoking status: Former Smoker --  1.50 packs/day for 30 years    Types: Cigarettes    Quit date: 09/16/1986  . Smokeless tobacco: Never Used     Comment: 35 pack year history, none in 18 years  . Alcohol Use: 0.0 oz/week    0 Standard drinks or equivalent per week     Comment: 1 glass of wine three a week (previously daily)  . Drug Use: No  . Sexual Activity: Not Currently   Other Topics Concern  . Not on file   Social History Narrative   Lives with wife.  4 kids, 1 stepchild (1 in [place], 4 in [place]). No pets. Wife is a diabetic    Family History  Problem Relation Age of Onset  . Coronary artery disease Mother     deceased  . Dementia Mother   . Atrial fibrillation Mother   . Stroke Mother     related to atrial fib  . Coronary artery disease Father     deceased  . Hypertension Father   . Heart disease Father   . Cancer Paternal Uncle     colon; died in his 77's  . Colon cancer Paternal Uncle   . Hypertension Paternal Grandmother   . Heart disease Paternal Grandmother   . Hypertension Paternal Grandfather   . Heart disease Paternal Grandfather   . Diabetes Neg Hx     ROS: no fevers or chills, productive cough, hemoptysis, dysphasia, odynophagia, melena, hematochezia, dysuria, hematuria, rash, seizure activity, orthopnea, PND, pedal edema, claudication. Remaining systems are negative.  Physical Exam: Well-developed obese in no acute distress.  Skin is warm and dry.  HEENT is normal.  Neck is  supple.  Chest is clear to auscultation with normal expansion.  Cardiovascular exam is regular rate and rhythm.  Abdominal exam nontender or distended. No masses palpated. Extremities show trace edema. neuro grossly intact  ECG Sinus rhythm at a rate of 77. PACs. Prolonged QT. Inferior infarct.

## 2016-02-08 ENCOUNTER — Ambulatory Visit (INDEPENDENT_AMBULATORY_CARE_PROVIDER_SITE_OTHER): Payer: 59 | Admitting: Cardiology

## 2016-02-08 DIAGNOSIS — I10 Essential (primary) hypertension: Secondary | ICD-10-CM

## 2016-02-08 DIAGNOSIS — I2510 Atherosclerotic heart disease of native coronary artery without angina pectoris: Secondary | ICD-10-CM

## 2016-02-08 DIAGNOSIS — I350 Nonrheumatic aortic (valve) stenosis: Secondary | ICD-10-CM | POA: Insufficient documentation

## 2016-02-08 DIAGNOSIS — E785 Hyperlipidemia, unspecified: Secondary | ICD-10-CM

## 2016-02-08 DIAGNOSIS — I4891 Unspecified atrial fibrillation: Secondary | ICD-10-CM | POA: Diagnosis not present

## 2016-02-08 DIAGNOSIS — I5031 Acute diastolic (congestive) heart failure: Secondary | ICD-10-CM

## 2016-02-08 LAB — BASIC METABOLIC PANEL
BUN: 18 mg/dL (ref 7–25)
CHLORIDE: 103 mmol/L (ref 98–110)
CO2: 23 mmol/L (ref 20–31)
CREATININE: 1 mg/dL (ref 0.70–1.18)
Calcium: 8.9 mg/dL (ref 8.6–10.3)
GLUCOSE: 89 mg/dL (ref 65–99)
POTASSIUM: 4.4 mmol/L (ref 3.5–5.3)
Sodium: 140 mmol/L (ref 135–146)

## 2016-02-08 MED ORDER — DOFETILIDE 500 MCG PO CAPS: 500.0000 ug | ORAL_CAPSULE | Freq: Two times a day (BID) | ORAL | Status: DC

## 2016-02-08 NOTE — Assessment & Plan Note (Signed)
Continue statin.No aspirin given need for anticoagulation. 

## 2016-02-08 NOTE — Assessment & Plan Note (Signed)
Probable mild to moderate almost recent echo. Doppler was not performed. S2 is not diminished. We will plan follow-up studies in the future.

## 2016-02-08 NOTE — Patient Instructions (Addendum)
Medication Instructions:   NO CHANGE  Labwork:  Your physician recommends that you HAVE LAB WORK TODAY  Follow-Up:  Your physician wants you to follow-up in: [place] will receive a reminder letter in the mail two months in advance. If you don't receive a letter, please call our office to schedule the follow-up appointment.   Your physician recommends that you schedule a follow-up appointment in: WITH NURSE IN [place]   If you need a refill on your cardiac medications before your next appointment, please call your pharmacy.

## 2016-02-08 NOTE — Assessment & Plan Note (Signed)
Blood pressure controlled. Continue present medications. 

## 2016-02-08 NOTE — Assessment & Plan Note (Addendum)
Patient appears to be euvolemic on examination. Continue present dose of diuretic. Check potassium and renal function.

## 2016-02-08 NOTE — Assessment & Plan Note (Signed)
Follow-up carotid Dopplers August 2017. 

## 2016-02-08 NOTE — Assessment & Plan Note (Addendum)
Patient is in sinus rhythm. Continue metoprolol, tikosyn and apixaban. Note patient would like to change to generic tikosyn. We will have him come to the office for an electrocardiogram one week after he completes his trade tikosyn and begins [employer].

## 2016-02-08 NOTE — Assessment & Plan Note (Signed)
Continue statin. 

## 2016-03-07 ENCOUNTER — Other Ambulatory Visit: Payer: Self-pay | Admitting: Family Medicine

## 2016-03-14 ENCOUNTER — Encounter: Payer: Self-pay | Admitting: Family Medicine

## 2016-03-14 ENCOUNTER — Ambulatory Visit (INDEPENDENT_AMBULATORY_CARE_PROVIDER_SITE_OTHER): Payer: 59 | Admitting: Family Medicine

## 2016-03-14 VITALS — BP 130/80 | HR 60 | Ht 67.0 in | Wt 274.8 lb

## 2016-03-14 DIAGNOSIS — Z0000 Encounter for general adult medical examination without abnormal findings: Secondary | ICD-10-CM | POA: Diagnosis not present

## 2016-03-14 DIAGNOSIS — I2510 Atherosclerotic heart disease of native coronary artery without angina pectoris: Secondary | ICD-10-CM

## 2016-03-14 DIAGNOSIS — I10 Essential (primary) hypertension: Secondary | ICD-10-CM

## 2016-03-14 DIAGNOSIS — G4733 Obstructive sleep apnea (adult) (pediatric): Secondary | ICD-10-CM | POA: Diagnosis not present

## 2016-03-14 DIAGNOSIS — R5383 Other fatigue: Secondary | ICD-10-CM | POA: Diagnosis not present

## 2016-03-14 DIAGNOSIS — I5031 Acute diastolic (congestive) heart failure: Secondary | ICD-10-CM

## 2016-03-14 DIAGNOSIS — E119 Type 2 diabetes mellitus without complications: Secondary | ICD-10-CM | POA: Diagnosis not present

## 2016-03-14 DIAGNOSIS — E7800 Pure hypercholesterolemia, unspecified: Secondary | ICD-10-CM

## 2016-03-14 DIAGNOSIS — E1151 Type 2 diabetes mellitus with diabetic peripheral angiopathy without gangrene: Secondary | ICD-10-CM

## 2016-03-14 DIAGNOSIS — K219 Gastro-esophageal reflux disease without esophagitis: Secondary | ICD-10-CM

## 2016-03-14 DIAGNOSIS — J309 Allergic rhinitis, unspecified: Secondary | ICD-10-CM | POA: Diagnosis not present

## 2016-03-14 DIAGNOSIS — E291 Testicular hypofunction: Secondary | ICD-10-CM | POA: Diagnosis not present

## 2016-03-14 LAB — LIPID PANEL
CHOL/HDL RATIO: 3 ratio (ref <=5.0)
CHOLESTEROL: 116 mg/dL — AB (ref 125–200)
HDL: 39 mg/dL — ABNORMAL LOW (ref >=40)
LDL Cholesterol: 50 mg/dL (ref <130)
TRIGLYCERIDES: 133 mg/dL (ref <150)
VLDL: 27 mg/dL (ref <30)

## 2016-03-14 LAB — TSH: TSH: 1.93 mIU/L (ref 0.40–4.50)

## 2016-03-14 LAB — POCT GLYCOSYLATED HEMOGLOBIN (HGB A1C): HEMOGLOBIN A1C: 6.1

## 2016-03-14 LAB — GLUCOSE, RANDOM: Glucose, Bld: 87 mg/dL (ref 65–99)

## 2016-03-14 NOTE — Progress Notes (Signed)
Chief Complaint  Patient presents with  . Annual Exam    fasting annual exam, no concerns. Did not do eye exam, states that he goes to the eye doctor.    Jeffrey Wiggins is a 75 y.o. male who presents for a complete physical.  He has the following concerns:  Diabetes. He has been following a diabetic diet (he limits carbs and sugar in his diet). He goes to the fitness center, getting exercise 3-5x/week. He was started on metformin in the hospital (03/2014), but it was stopped when they never let him participate in rehab (or made him eat so much to get his sugar up, that he was gaining weight). Diabetes was diagnosed 05/2013 based on A1c of 6.7, only took med for that very short while. No polydipsia, polyuria, numbness, tingling, vision or feet concerns.  Iron deficiency, mild anemia: He has been taking iron once daily and last check was normal. Lab Results  Component Value Date   WBC 13.3* 01/21/2016   HGB 14.6 01/21/2016   HCT 46.6 01/21/2016   MCV 80.1 01/21/2016   PLT 248 01/21/2016   Hyperlipidemia: He completed the study drug in the Fall (he believes it was likely placebo, because HDL didn't improve like it was supposed to); he continues on 80mg  of lipitor. He isn't having side effects or problems. He is fasting for labs today.  He saw Dr. Leonie Man 06/2015 for f/u stroke (embolic L occipital infarct in 09/2014. He remains on Eliquis for stroke prevention for atrial fibrillation. He doesn't plan to see Dr. Leonie Man in follow-up in October.  He doesn't check his blood pressure elsewhere, just at doctor visits. Denies headaches, dizziness, denies any chest pain, palpitations. No longer noticing fib/flutter on machines at the gym (never has symptoms, only sees on machine pulse monitoring). He recently was hospitalized and started on Tikosyn.  CAD: S/p CABG; stable. Not on aspirin due to being on eliquis He continues to have some breathlessness if he walks too far "breathes harder than what I  want to". No problems with doing 30 minutes of cardio at the gym, using recumbent bike, so he thinks this could be related to his weight.  Diastolic heart failure--on lasix per cardiologist He was taken off the losartan and spironolactone due to high potassium levels. He has had more swelling in his legs since early June.  He has gained 13# during this time frame (per pt's scale) related to swelling.  He has f/u with Dr. Stanford Breed next week. He has not contacted the cardiologist with this weight gain for adjustment of meds.  He has not yet taken his lasix today.  Carotid artery stenosis--dopplers due again 04/2017  OSA: compliant with CPAP use nightly.  GERD--controlled with meds. He takes the prilosec every other day; sometimes has recurrent heartburn on the days he doesn't take, but if he knows he will be eating something that will trigger it, he takes it anyway. Denies dysphagia. Has a cough that gets up clear mucus a few times/day--chronic and unchanged. He does have allergies, which are controlled with claritin and flonase.  Hypogonadism--He has been back on testosterone injections (for the last couple of years), and notices improvement in symptoms--now able to have erections. Not having sexual relations currently (due to wife's lack of interest).  He sees Dr. Gaynelle Arabian yearly for followup on his prostate. He does not check testicles, just the prostate. Next appointment is within the next 2 months, as a f/u for his recent TURP. He plans  to discuss whether or not he should continue the testosterone replacement (given lack of need for ED to be treated at this point, and potential risks).  Obesity:  He didn't pursue nutritionist or any weight loss options (we had discussed Weight Watchers among other things at his last visit).         Immunization History  Administered Date(s) Administered  . Influenza Split 07/07/2012, 06/18/2013, 07/18/2015  . Influenza, High Dose Seasonal PF  06/14/2014  . Pneumococcal Conjugate-13 08/18/2014  . Pneumococcal Polysaccharide-23 08/20/2007  . Td 08/16/2002  . Tdap 03/16/2012  . Zoster 12/04/2012   Last colonoscopy: 01/2015 with Dr. Penelope Coop; +tubular adenoma Last PSA: per Dr. Gaynelle Arabian, Dentist: twice yearly  Ophtho: yearly, UTD Exercise: 3-5 times/week, 60 mins of cardio and weights  Has healthcare power of attorney and living will. He is Full Code, Full Care  Past Medical History  Diagnosis Date  . CAD (coronary artery disease)   . HTN (hypertension)   . Prostatitis   . Hyperlipidemia   . Obesity   . Diverticular disease   . Gallstones   . Arthritis   . Sleep apnea     mild, CPAP wasn't indicated (per pt)--2014 mod-severe, on CPAP since 07/2013  . Benign prostatic hypertrophy     followed by Dr. Gaynelle Arabian; regrowth R lat lob (11/2015)  . Cerebrovascular disease   . Sepsis due to Escherichia coli [facility]) ? 2011    DUE TO URINARY OUTLET OBSTRUCTION /INFECTION  . UTI (urinary tract infection)     secondary  . Allergy     RHINITIS  . GERD (gastroesophageal reflux disease)   . Hypogonadism male     treated by Dr. Gaynelle Arabian  . Erectile dysfunction   . Peripheral vascular disease ([place])     02/28/12 MOST RECENT CAROTID DUPLEX--"STABLE MILD CALCIFIED PLAQUE, BILATERALY, STABLE, OVER SERIAL EXAMS"  . 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 Diverticulosis     seen on colonoscopy (9/05, 06/2009)  . Hyperplastic colon polyp 05/2004    tubular adenoma 01/2015  . Myocardial infarction [facility]) '98, '09    (Dr. Stanford Breed); s/p stents  . Stroke [facility]) jan 2016  . Pneumonia age 41    hx of  . Shortness of breath dyspnea     with exertion only  . Hepatitis age 55    "called yellow jaundice"  . Nocturia   . Cataracts, bilateral   . Family history of adverse reaction to anesthesia     mother had memeory problems after  anesthesia with hip replacement, lasted until she died 2 to 3 years later  . Atrial fibrillation ([place])   . Visit for monitoring Tikosyn therapy   . Adenomatous colon polyp 01/2015    Dr. Penelope Coop    Past Surgical History  Procedure Laterality Date  . Total knee arthroplasty  L 4/06, R 4/07  . Vasectomy    . Tonsillectomy    . Transurethral resection of prostate  06/2010  . Knee surgery  05/2010    removing scar tissue R knee (Dr. Wynelle Link)  . Joint replacement      both knees  . Total knee revision  09/23/2012    Procedure: TOTAL KNEE REVISION;  Surgeon: Gearlean Alf, MD; right side  Location: [facility];  Service: Orthopedics;  Laterality: Right;  .  Intraoperative transesophageal echocardiogram N/A 04/15/2014    Procedure: INTRAOPERATIVE TRANSESOPHAGEAL ECHOCARDIOGRAM;  Surgeon: Rexene Alberts, MD;  Location: [place];  Service: Open Heart Surgery;  Laterality: N/A;  . Left and right heart catheterization with coronary angiogram  04/13/2014    Procedure: LEFT AND RIGHT HEART CATHETERIZATION WITH CORONARY ANGIOGRAM;  Surgeon: Sinclair Grooms, MD;  Location: [facility] CATH LAB;  Service: Cardiovascular;;  . Cystoscopy  11/17/15    Tannenbaum  . Coronary artery bypass graft N/A 04/15/2014    Procedure: CORONARY ARTERY BYPASS GRAFTING TIMES TWO USEING LEFT INTERNAL MAMMARY ARTERY AND RIGHT GREATER SAPHENOUS VEIN VIA ENDOVEIN HARVEST;  Surgeon: Rexene Alberts, MD;  Location: [place];  Service: Open Heart Surgery;  Laterality: N/A;  . Coronary angioplasty with stent placement  01/1997, stent x 1, 2009 2nd stent in same location    PTCA/stent to RCA  AND 2009 2ND STENT PLACED  . Green light laser turp (transurethral resection of prostate N/A 01/15/2016    Procedure: GREEN LIGHT LASER TURP (TRANSURETHRAL RESECTION OF PROSTATE;  Surgeon: Carolan Clines, MD;  Location: [facility];  Service: Urology;  Laterality: N/A;  LASER RIGHT LATERAL LOBE OF PROSTATE AND LEFT BLADDER NECK      Social History   Social  History  . Marital Status: Married    Spouse Name: N/A  . Number of Children: 4  . Years of Education: N/A   Occupational History  . CPA    Social History Main Topics  . Smoking status: Former Smoker -- 1.50 packs/day for 30 years    Types: Cigarettes    Quit date: 09/16/1986  . Smokeless tobacco: Never Used     Comment: 35 pack year history, none in 18 years  . Alcohol Use: 0.0 oz/week    0 Standard drinks or equivalent per week     Comment: 1 glass of wine three times a week (previously daily)  . Drug Use: No  . Sexual Activity: Not Currently   Other Topics Concern  . Not on file   Social History Narrative   Lives with wife.  4 kids, 1 stepchild (1 in [place], 4 in [place]). 11 grandchildren, 1 great grandchild.   No pets. Wife is a diabetic.   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 Coronary artery disease Mother     deceased  . Dementia Mother   . Atrial fibrillation Mother   . Stroke Mother     related to atrial fib  . Coronary artery disease Father     deceased  . Hypertension Father   . Heart disease Father   . Cancer Paternal Uncle     colon; died in his 14's  . Colon cancer Paternal Uncle   . Hypertension Paternal Grandmother   . Heart disease Paternal Grandmother   . Hypertension Paternal Grandfather   . Heart disease Paternal Grandfather   . Diabetes Neg Hx   . Hypothyroidism Daughter     congenital    Outpatient Encounter Prescriptions as of 03/14/2016  Medication Sig Note  . acetaminophen (TYLENOL) 500 MG tablet Take 1,000 mg by mouth at bedtime as needed for mild pain.    Marland Kitchen apixaban (ELIQUIS) 5 MG TABS tablet Take 1 tablet (5 mg total) by mouth 2 (two) times daily.   Marland Kitchen atorvastatin (LIPITOR) 80 MG tablet Take 1 tablet (80 mg total) by mouth every evening. (Patient taking differently: Take 80 mg  by mouth at bedtime. )   . CALCIUM-MAGNESIUM-ZINC PO Take 1 tablet by mouth at bedtime.    .  Cholecalciferol (VITAMIN D) 2000 UNITS tablet Take 4,000 Units by mouth at bedtime.    . Coenzyme Q10 (CO Q-10) 100 MG CAPS Take 100 mg by mouth at bedtime.    . Diclofenac Sodium 3 % GEL APPLY UP TO TWICE DAILY AS DIRECTED 03/14/2016: Uses on his head, per dermatologist  . dofetilide (TIKOSYN) 500 MCG capsule Take 1 capsule (500 mcg total) by mouth 2 (two) times daily.   Marland Kitchen Fe Bisgly-Vit C-Vit B12-FA (GENTLE IRON PO) Take 1 capsule by mouth at bedtime.   . fluticasone (FLONASE) 50 MCG/ACT nasal spray USE 2 SPRAYS NASALLY DAILY   . furosemide (LASIX) 40 MG tablet Take 1 tablet (40 mg total) by mouth daily.   Marland Kitchen loratadine (CLARITIN) 10 MG tablet Take 10 mg by mouth at bedtime.    . metoprolol tartrate (LOPRESSOR) 25 MG tablet Take 1 tablet (25 mg total) by mouth 2 (two) times daily.   . Multiple Vitamin (MULTIVITAMIN WITH MINERALS) TABS tablet Take 1 tablet by mouth daily. Centrum silver   . omeprazole (PRILOSEC) 20 MG capsule Take 20 mg by mouth every other day.    . testosterone cypionate (DEPOTESTOTERONE CYPIONATE) 200 MG/ML injection Inject 200 mg into the muscle every 14 (fourteen) days.  08/24/2015: Sees Dr. Gaynelle Arabian  . nitroGLYCERIN (NITROSTAT) 0.4 MG SL tablet Place 1 tablet (0.4 mg total) under the tongue every 5 (five) minutes as needed for chest pain. (Patient not taking: Reported on 03/14/2016) 01/15/2016: Never taken   No facility-administered encounter medications on file as of 03/14/2016.    No Known Allergies   ROS: The patient denies anorexia, fever, headaches, vision loss (vision symptoms related to stroke resolved); denies ear pain, hoarseness, chest pain, palpitations, syncope,; denies nausea, vomiting, diarrhea, constipation, abdominal pain, melena, hematochezia, hematuria, incontinence, dysuria, genital lesions, numbness, tingling, weakness, tremor, suspicious skin lesions, depression, anxiety, abnormal bleeding/bruising, or enlarged lymph nodes.  +edema per HPI dypsnea per HPI  (chronic, unchanged) +hearing loss, wears hearing aides.  Up 2-3 times a night to void, depending on fluid intake.  Mild chronic cough Some bilateral knee pain/stiffness    PHYSICAL EXAM:  BP 130/80 mmHg  Pulse 60  Ht 5\' 7"  (1.702 m)  Wt 274 lb 12.8 oz (124.648 kg)  BMI 43.03 kg/m2   General Appearance:  Alert, cooperative, no distress, appears stated age, obese   Head:  Normocephalic, without obvious abnormality, atraumatic   Eyes:  PERRL, conjunctiva/corneas clear, EOM's intact, fundi  benign   Ears:  +hearing aides bilaterally   Nose:  Nares normal, mucosa mildly edematous, no erythema or purulence, no drainage or sinus tenderness   Throat:  Lips, mucosa, and tongue normal; teeth and gums normal   Neck:  Supple, no lymphadenopathy; thyroid: no enlargement/tenderness/nodules; no carotid  bruit or JVD   Back:  Spine nontender, no curvature, ROM normal, no CVA tenderness   Lungs:  Clear to auscultation bilaterally without wheezes, rales or ronchi; respirations unlabored   Chest Wall:  No tenderness or deformity   Heart:  Regular rate and rhythm. Slight murmur, no rub or gallop.   Breast Exam:  No chest wall tenderness, or masses   Abdomen:  Soft, non-tender, nondistended, normoactive bowel sounds,  no masses, no hepatosplenomegaly. +ventral hernia, nontender, reducible  Genitalia:  Testicles descended bilaterally without masses. No hernias, no lesions  Rectal:  Deferred to urologist.  Extremities:  No clubbing or cyanosis, 2+ pretibial edema bilaterally(hasn't taken lasix yet today)  Pulses:  2+ and symmetric all extremities   Skin:  Skin turgor normal, no rashes or lesions; hyperpigmentation present R>L consistent with venous stasis changes.  Lymph nodes:  Cervical, supraclavicular, and axillary nodes normal   Neurologic:  CNII-XII intact, normal strength, sensation and gait; reflexes 2+ and  symmetric throughout    Psych: Normal mood, affect, hygiene and grooming                Chemistry      Component Value Date/Time   NA 140 02/08/2016 1453   K 4.4 02/08/2016 1453   CL 103 02/08/2016 1453   CO2 23 02/08/2016 1453   BUN 18 02/08/2016 1453   CREATININE 1.00 02/08/2016 1453   CREATININE 1.20 02/01/2016 0316      Component Value Date/Time   CALCIUM 8.9 02/08/2016 1453   ALKPHOS 52 01/20/2016 1137   AST 31 01/20/2016 1137   ALT 35 01/20/2016 1137   BILITOT 1.4* 01/20/2016 1137     Lab Results  Component Value Date   HGBA1C 6.1 03/14/2016    ASSESSMENT/PLAN:  Annual physical exam - Plan: Lipid panel, Glucose, random, VITAMIN D 25 Hydroxy (Vit-D Deficiency, Fractures), TSH  Controlled type 2 diabetes mellitus without complication, without long-term current use of insulin (HCC) - encouraged daily exercise, weight loss - Plan: HgB A1c, Glucose, random, TSH  Pure hypercholesterolemia - lipids are first time since off study drug, just on atorvastatin - Plan: Lipid panel  Essential hypertension - controlled  Obstructive sleep apnea - continue CPAP  Coronary artery disease involving native coronary artery of native heart without angina pectoris - stable; continue routine f/u with cardiology  Gastroesophageal reflux disease, esophagitis presence not specified - controlled; weight loss recommended  Allergic rhinitis, unspecified allergic rhinitis type - controlled  Hypogonadism, testicular  Morbid obesity, unspecified obesity type ([place]) - risks reviewed; encouraged weight loss, need for proper diet, portions, plus minimum exercise recommendations  Acute diastolic congestive heart failure ([place]) - increased edema since spironolactone stopped; low sodium diet. likely needs higher lasix dose (he declines, wants to wait to see cardiologist)  DM (diabetes mellitus), type 2 with peripheral vascular complications ([place]) - remains diet  controlled, but A1c is higher than last check  Other fatigue - Plan: VITAMIN D 25 Hydroxy (Vit-D Deficiency, Fractures), TSH   Lipid, TSH, Vitamin D (has never been screened, takes supplement; currently has private insurance, not Medicare)   PSA screening and prostate exams are done by his urologist; recommended at least 30 minutes of aerobic activity at least 5 days/week, weight-bearing exercise at least 2x/wk; proper sunscreen use reviewed; healthy diet and alcohol recommendations (less than or equal to 2 drinks/day) reviewed; regular seatbelt use; changing batteries in smoke detectors. Self-testicular exams. Immunization recommendations discussed--UTD.Continue high dose flu shots yearly. Colonoscopy recommendations reviewed--UTD  Advised to contact his cardiologist and not weight for his appointment due to weigh gain and fluid accumulation.  Counseled re: risks of obesity, and encouraged him to work harder on weight loss plan.   F/u 6 months, sooner prn

## 2016-03-14 NOTE — Patient Instructions (Signed)
  HEALTH MAINTENANCE RECOMMENDATIONS:  It is recommended that you get at least 30 minutes of aerobic exercise at least 5 days/week (for weight loss, you may need as much as 60-90 minutes). This can be any activity that gets your heart rate up. This can be divided in 10-15 minute intervals if needed, but try and build up your endurance at least once a week.  Weight bearing exercise is also recommended twice weekly.  Eat a healthy diet with lots of vegetables, fruits and fiber.  "Colorful" foods have a lot of vitamins (ie green vegetables, tomatoes, red peppers, etc).  Limit sweet tea, regular sodas and alcoholic beverages, all of which has a lot of calories and sugar.  Up to 2 alcoholic drinks daily may be beneficial for men (unless trying to lose weight, watch sugars).  Drink a lot of water.  Sunscreen of at least SPF 30 should be used on all sun-exposed parts of the skin when outside between the hours of 10 am and 4 pm (not just when at beach or pool, but even with exercise, golf, tennis, and yard work!)  Use a sunscreen that says "broad spectrum" so it covers both UVA and UVB rays, and make sure to reapply every 1-2 hours.  Remember to change the batteries in your smoke detectors when changing your clock times in the spring and fall.  Use your seat belt every time you are in a car, and please drive safely and not be distracted with cell phones and texting while driving.  Please work on losing weight from your waist.  I do understand that some of the weight gain is the fluid in your legs.  Please be sure to address that with the cardiologist (sooner rather than later--typical protocols are to call them with significant weight changes).  Sugars overall have increased some, but still considered diet-controlled diabetes.

## 2016-03-15 LAB — VITAMIN D 25 HYDROXY (VIT D DEFICIENCY, FRACTURES): Vit D, 25-Hydroxy: 49 ng/mL (ref 30–100)

## 2016-03-18 ENCOUNTER — Telehealth: Payer: Self-pay | Admitting: *Deleted

## 2016-03-18 ENCOUNTER — Ambulatory Visit (INDEPENDENT_AMBULATORY_CARE_PROVIDER_SITE_OTHER): Payer: 59 | Admitting: *Deleted

## 2016-03-18 DIAGNOSIS — I4891 Unspecified atrial fibrillation: Secondary | ICD-10-CM | POA: Diagnosis not present

## 2016-03-18 NOTE — Patient Instructions (Signed)
No change in medications at this time.  

## 2016-03-18 NOTE — Progress Notes (Signed)
1.) Reason for visit: ECG  2.) Name of MD requesting visit: crenshaw  3.) H&P: pt recently changed from brand tikosyn to generic. Here for ECG after change in med  4.) ROS related to problem: no problems.  5.) Assessment and plan per MD: ECG reviewed by dr hilty and was okay

## 2016-03-18 NOTE — Telephone Encounter (Signed)
Pt here for nurse room ECG, he reports an increase in weight of 15 lbs since June 1st. He feels this is related to fluid retention. He has pitting edema in both feet and legs. He reports getting SOB faster than his usual, denies orthopnea. Pt will double his furosemide for the next 3 days. He will call on Friday and report how his weight and edema are doing. Pt agreed with this plan.

## 2016-03-22 ENCOUNTER — Telehealth: Payer: Self-pay | Admitting: Cardiology

## 2016-03-22 DIAGNOSIS — R601 Generalized edema: Secondary | ICD-10-CM

## 2016-03-22 NOTE — Telephone Encounter (Signed)
Jeffrey Wiggins is calling to discuss some medication  . Please call

## 2016-03-22 NOTE — Telephone Encounter (Signed)
Discussed with dr Stanford Breed, pt to take 40 mg of furosemide once daily and take an additional 40 mg as needed for weight gain of 2-3-lbs. He will have bmp in 2 weeks.

## 2016-03-22 NOTE — Telephone Encounter (Signed)
Spoke with pt, this week he has lost 6 lbs with the double dose of furosemide. His edema is better. He ate some salt yesterday and drank a lot of water and is up 2 lbs today. Sodium and fluid restrictions discussed. He does not feel the furosemide is working appropriately, he does not trust generic drugs. ? If he should continue to double the furosemide. Will discuss with dr Stanford Breed

## 2016-03-24 IMAGING — CR DG CHEST 1V PORT
1 series · 1 of 1 positions shown · non-contrast
Comparison: PA and lateral chest x-ray May 29, 2012

CLINICAL DATA: Status post cardiac surgery

EXAM:
PORTABLE CHEST - 1 VIEW

[AP]
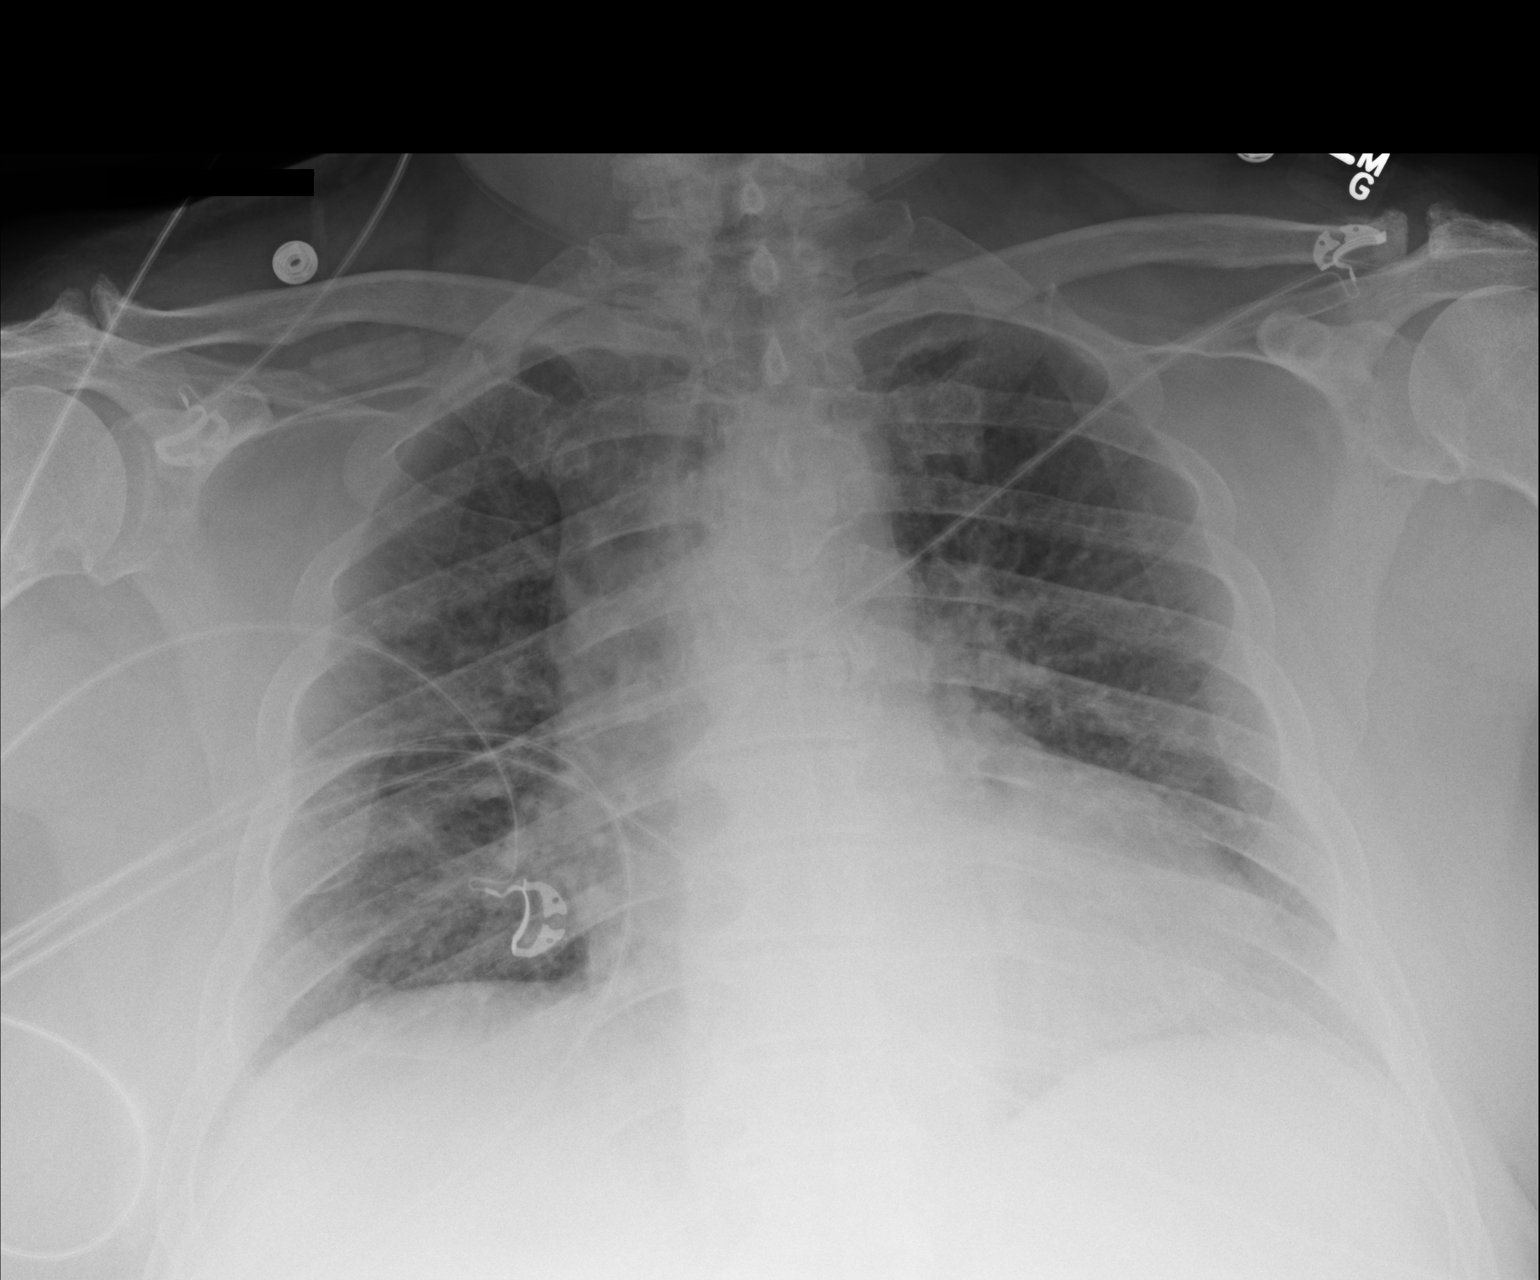

[1 of 1 positions shown; findings below may reference images not displayed]

FINDINGS: The cardiopericardial silhouette is enlarged. The central pulmonary
vascularity is minimally prominent. There is no pleural effusion or
alveolar infiltrate. The bony thorax is unremarkable.
IMPRESSION: Mild interstitial prominence and increase in size of the cardiac
silhouette may reflect low-grade CHF. However, today's portable film
is being compared to a previous PA study. A PA and lateral chest
x-ray would be useful.

## 2016-03-27 IMAGING — CR DG CHEST 1V PORT
1 series · 1 of 1 positions shown · non-contrast
Comparison: 04/16/2014

CLINICAL DATA: Postop cardiac surgery

EXAM:
PORTABLE CHEST - 1 VIEW

[AP]
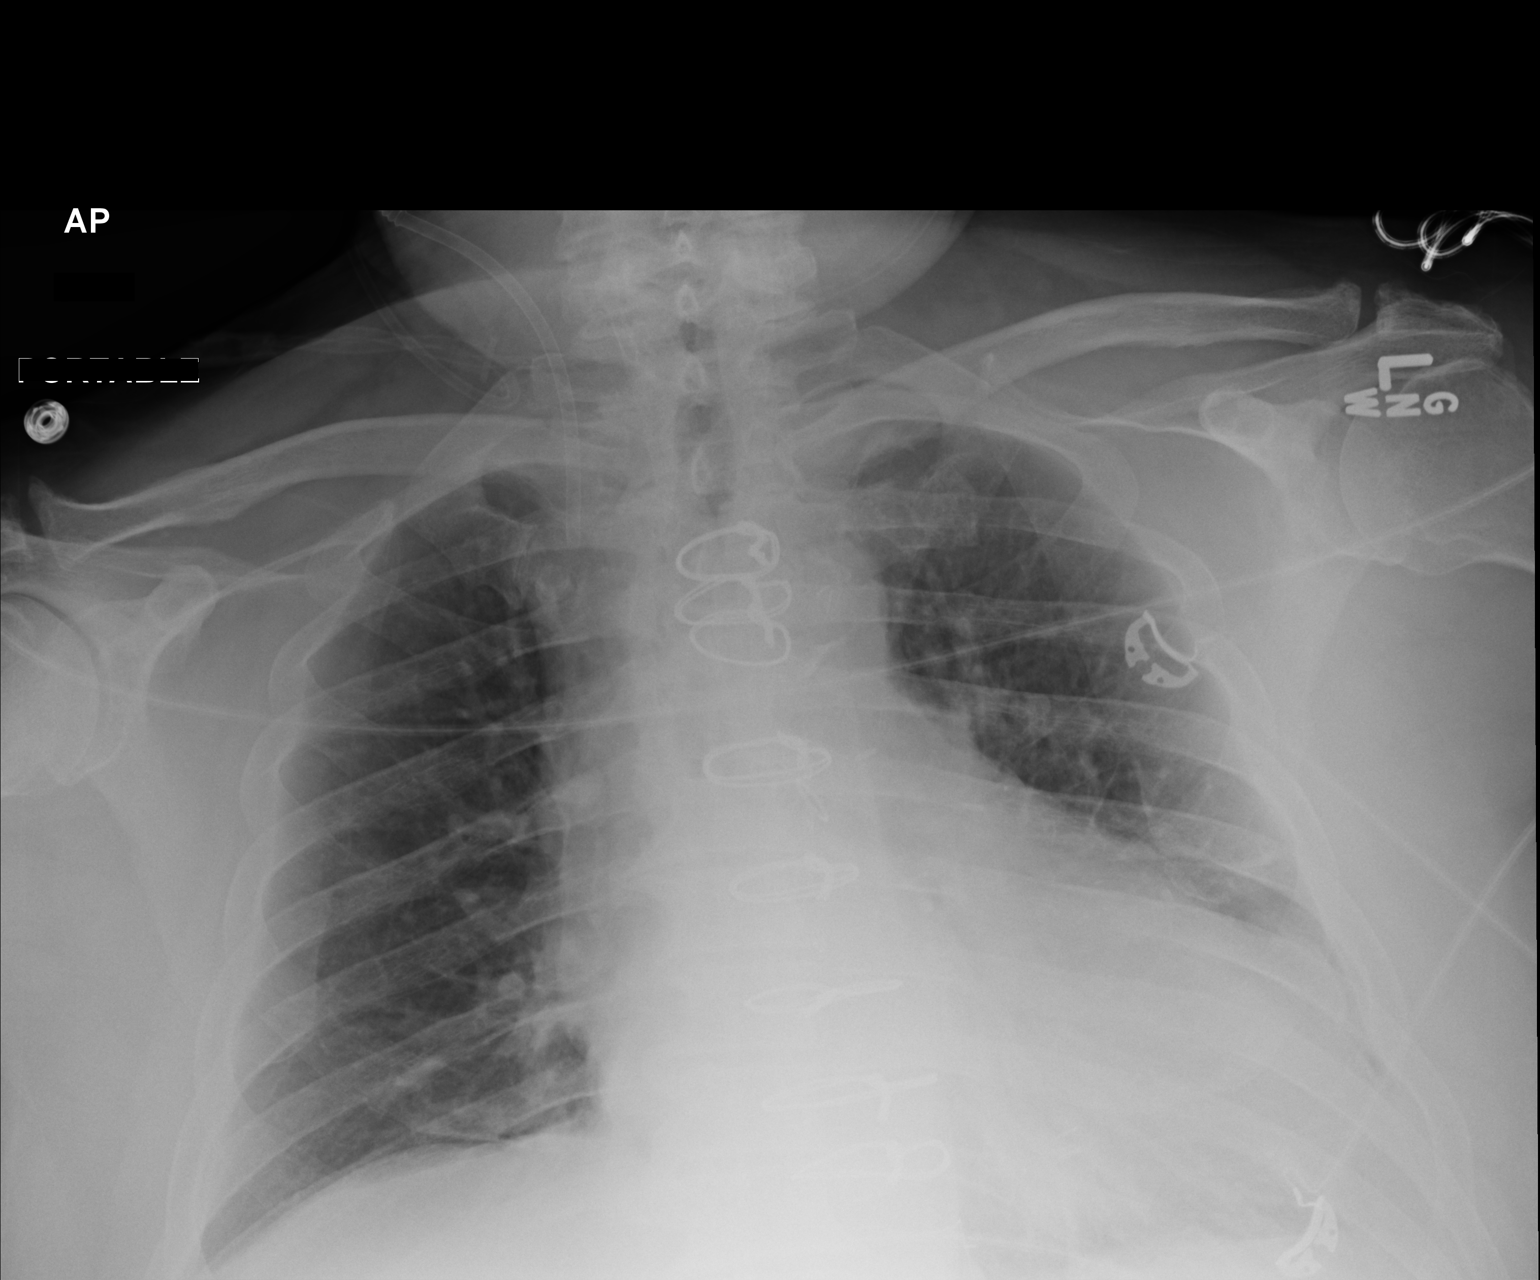

[1 of 1 positions shown; findings below may reference images not displayed]

FINDINGS: Lungs are essentially clear. Left lung bases partially obscured. No
focal consolidation or frank interstitial edema. No pneumothorax.

Interval removal of left chest tube, mediastinal drains, and right
IJ Swan-Ganz catheter. Residual right IJ venous sheath.

Cardiomegaly. Median sternotomy. Postsurgical changes related to
prior CABG.
IMPRESSION: Interval removal of left chest tube, mediastinal drain, and right IJ
Swan-Ganz catheter.

No pneumothorax is seen.

## 2016-04-11 LAB — BASIC METABOLIC PANEL
BUN: 14 mg/dL (ref 7–25)
CHLORIDE: 102 mmol/L (ref 98–110)
CO2: 29 mmol/L (ref 20–31)
CREATININE: 1.1 mg/dL (ref 0.70–1.18)
Calcium: 9.3 mg/dL (ref 8.6–10.3)
Glucose, Bld: 127 mg/dL — ABNORMAL HIGH (ref 65–99)
Potassium: 4.5 mmol/L (ref 3.5–5.3)
Sodium: 140 mmol/L (ref 135–146)

## 2016-06-27 ENCOUNTER — Encounter: Payer: Self-pay | Admitting: Neurology

## 2016-06-27 ENCOUNTER — Ambulatory Visit (INDEPENDENT_AMBULATORY_CARE_PROVIDER_SITE_OTHER): Payer: 59 | Admitting: Neurology

## 2016-06-27 VITALS — BP 138/77 | HR 62 | Ht 67.0 in | Wt 282.6 lb

## 2016-06-27 DIAGNOSIS — I2510 Atherosclerotic heart disease of native coronary artery without angina pectoris: Secondary | ICD-10-CM

## 2016-06-27 DIAGNOSIS — I6990 Unspecified sequelae of unspecified cerebrovascular disease: Secondary | ICD-10-CM

## 2016-06-27 NOTE — Patient Instructions (Signed)
I had a long d/w patient about his recent stroke,atrial fibrillation, risk for recurrent stroke/TIAs, personally independently reviewed imaging studies and stroke evaluation results and answered questions.Continue Eliquis (apixaban) daily  for secondary stroke prevention and maintain strict control of hypertension with blood pressure goal below 130/90, diabetes with hemoglobin A1c goal below 6.5% and lipids with LDL cholesterol goal below 70 mg/dL. I also advised the patient to eat a healthy diet with plenty of whole grains, cereals, fruits and vegetables, exercise regularly and maintain ideal body weight Followup in the future with me only as necessary and no routine scheduled appointment is necessary.

## 2016-06-27 NOTE — Progress Notes (Signed)
[facility] [address] [place]. [place] [address] [phone]       OFFICE FOLLOW-UP NOTE  [name] Date of Birth:  1941-04-04 Medical Record Number:  [id number]   HPI: 75 year Caucasian male seen today for the first office follow-up visit following hospital admission for stroke. He was admitted on 09/25/14 with lightheadedness associated with headache and right-sided visual field disturbance. He was found to be in new onset atrial flutter fibrillation the emergency room. MRI scan of the brain showed a small left occipital infarct and mild changes of small vessel disease. MRA of the brain showed no significant stenosis and carotid ultrasound showed no extracranial stenosis. Transthoracic echo showed normal ejection fraction. LDL cholesterol was 51 mg percent hemoglobin A1c was 6.0. Urine drug screen was negative. He was started on eliquis for second stroke prevention and states his done well and is 90% better. He has only very minimum right upper vision quadrant of vision difficulties. He feels his memory is fine he works as a Licensed conveyancer and has had no trouble doing his taxes this year. She is on metoprolol for heart rate and states that while exercising last week he may have gone back present into atrial flutter. He does use CPAP regularly every night for sleep apnea. He has been exercising for almost one hour 5 days a week regularly. He has no other complaints today. Update 06/29/2015 : He returns for follow-up after last visit 6 months ago. He remains on eliquis which is tolerating well without bleeding bruising or other side effects. He states blood pressure is well controlled and today it is 118/68 in office. He states his peripheral vision loss has improved completely. Hie saw eye doctor a month ago and he found no deficits. He has been driving okay without problems. He remains on Lipitor without myalgias or arthralgias. In fact he is completing participation in  a new lipid drug study at [facility]  cardiovascular research to   improve HDL levels. Update 06/27/2016 : He returns for follow-up after last visit a year ago. He continues to do well without recurrent stroke or TIA symptoms. He remains on eliquis which is tolerating well without bleeding or bruising. Patient was briefly hospitalized 5 months ago for rapid atrial fibrillation heart rate and was started on Tikosyn and states now his heart rate is much better controlled. He remains on Lipitor 80 mg just tolerating well without muscle aches and pains. He cannot tell me when last lipid profile was checked. Patient has some occasional shimmering lights in his vision. He has been followed regularly by ophthalmologist Dr. Herbert Deaner and has an upcoming appointment in 10 days and will discuss this further. He has no new complaints today. ROS:   14 system review of systems is positive for   blurred vision, cough, leg swelling, sleep apnea and all other systems negative  PMH:  Past Medical History:  Diagnosis Date  . Adenomatous colon polyp 01/2015   Dr. Penelope Coop  . Allergy    RHINITIS  . Arthritis   . Atrial fibrillation ([place])   . Benign prostatic hypertrophy    followed by Dr. Gaynelle Arabian; regrowth R lat lob (11/2015)  . CAD (coronary artery disease)   . Cataracts, bilateral   . Cerebrovascular disease   . Diverticular disease   . Diverticulosis    seen on colonoscopy (9/05, 06/2009)  . Erectile dysfunction   . Family history of adverse reaction to anesthesia    mother had memeory  problems after anesthesia with hip replacement, lasted until she died 2 to 3 years later  . Gallstones   . GERD (gastroesophageal reflux disease)   . 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 Hepatitis age 21   "called yellow jaundice"  . HTN (hypertension)   . Hyperlipidemia   . Hyperplastic colon polyp 05/2004   tubular adenoma 01/2015  . Hypogonadism male    treated by Dr. Gaynelle Arabian  . Myocardial infarction '98,  '09   (Dr. Stanford Breed); s/p stents  . Nocturia   . Obesity   . Peripheral vascular disease ([place])    02/28/12 MOST RECENT CAROTID DUPLEX--"STABLE MILD CALCIFIED PLAQUE, BILATERALY, STABLE, OVER SERIAL EXAMS"  . Pneumonia age 75   hx of  . Prostatitis   . 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 Sepsis due to Escherichia coli [facility]) ? 2011   DUE TO URINARY OUTLET OBSTRUCTION /INFECTION  . Shortness of breath dyspnea    with exertion only  . Sleep apnea    mild, CPAP wasn't indicated (per pt)--2014 mod-severe, on CPAP since 07/2013  . Stroke [facility]) jan 2016  . UTI (urinary tract infection)    secondary  . Visit for monitoring Tikosyn therapy     Social History:  Social History   Social History  . Marital status: Married    Spouse name: N/A  . Number of children: 4  . Years of education: N/A   Occupational History  . CPA    Social History Main Topics  . Smoking status: Former Smoker    Packs/day: 1.50    Years: 30.00    Types: Cigarettes    Quit date: 09/16/1986  . Smokeless tobacco: Never Used     Comment: 35 pack year history, none in 18 years  . Alcohol use 0.6 oz/week    1 Glasses of wine per week     Comment: 1 glass of wine three times a week (previously daily)  . Drug use: No  . Sexual activity: Not Currently   Other Topics Concern  . Not on file   Social History Narrative   Lives with wife.  4 kids, 1 stepchild (1 in [place], 4 in [place]). 11 grandchildren, 1 great grandchild.   No pets. Wife is a diabetic.   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 acetaminophen (TYLENOL) 500 MG tablet Take 1,000 mg by mouth at bedtime as needed for mild pain.     Marland Kitchen apixaban (ELIQUIS) 5 MG TABS tablet Take 1 tablet (5 mg total) by mouth 2 (two) times daily. 180 tablet 3  . atorvastatin (LIPITOR) 80 MG tablet Take 1  tablet (80 mg total) by mouth every evening. (Patient taking differently: Take 80 mg by mouth at bedtime. ) 90 tablet 3  . CALCIUM-MAGNESIUM-ZINC PO Take 1 tablet by mouth at bedtime.     . Cholecalciferol (VITAMIN D) 2000 UNITS tablet Take 4,000 Units by mouth at bedtime.     . Coenzyme Q10 (CO Q-10) 100 MG CAPS Take 100 mg by mouth at bedtime.     . Diclofenac Sodium 3 % GEL APPLY UP TO TWICE DAILY AS DIRECTED  3  . dofetilide (TIKOSYN) 500 MCG capsule Take 1 capsule (500 mcg total) by mouth  2 (two) times daily. 180 capsule 3  . Fe Bisgly-Vit C-Vit B12-FA (GENTLE IRON PO) Take 1 capsule by mouth at bedtime.    . fluticasone (FLONASE) 50 MCG/ACT nasal spray USE 2 SPRAYS NASALLY DAILY 48 g 1  . furosemide (LASIX) 40 MG tablet Take 1 tablet (40 mg total) by mouth daily. 90 tablet 3  . loratadine (CLARITIN) 10 MG tablet Take 10 mg by mouth at bedtime.     . metoprolol tartrate (LOPRESSOR) 25 MG tablet Take 1 tablet (25 mg total) by mouth 2 (two) times daily. 180 tablet 3  . Multiple Vitamin (MULTIVITAMIN WITH MINERALS) TABS tablet Take 1 tablet by mouth daily. Centrum silver    . nitroGLYCERIN (NITROSTAT) 0.4 MG SL tablet Place 1 tablet (0.4 mg total) under the tongue every 5 (five) minutes as needed for chest pain. 25 tablet 3  . omeprazole (PRILOSEC) 20 MG capsule Take 20 mg by mouth every other day.     . testosterone cypionate (DEPOTESTOTERONE CYPIONATE) 200 MG/ML injection Inject 200 mg into the muscle every 14 (fourteen) days.      No current facility-administered medications on file prior to visit.     Allergies:  No Known Allergies  Physical Exam General: Obese middle-age Caucasian male, seated, in no evident distress Head: head normocephalic and atraumatic.  Neck: supple with no carotid or supraclavicular bruits Cardiovascular: regular rate and rhythm, no murmurs Musculoskeletal: no deformity Skin:  no rash/petichiae Vascular:  Normal pulses all extremities Vitals:   06/27/16  1347  BP: 138/77  Pulse: 62   Neurologic Exam Mental Status: Awake and fully alert. Oriented to place and time. Recent and remote memory intact. Attention span, concentration and fund of knowledge appropriate. Mood and affect appropriate.  Cranial Nerves: Fundoscopic exam not done. Pupils equal, briskly reactive to light. Extraocular movements full without nystagmus. Visual fields full to confrontation except partial RU quadrant defect in right eye. Hearing intact. Facial sensation intact. Face, tongue, palate moves normally and symmetrically.  Motor: Normal bulk and tone. Normal strength in all tested extremity muscles. Sensory.: intact to touch ,pinprick .position and vibratory sensation.  Coordination: Rapid alternating movements normal in all extremities. Finger-to-nose and heel-to-shin performed accurately bilaterally. Gait and Station: Arises from chair without difficulty. Stance is normal. Gait demonstrates normal stride length and balance . Able to heel, toe and tandem walk without difficulty.  Reflexes: 1+ and symmetric. Toes downgoing.     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 Multiple vascular risk factors of hypertension, hyperlipidemia, heart disease, obesity , sleep apnoea and atrial flutter.    PLAN: I had a long d/w patient about his recent stroke,atrial fibrillation, risk for recurrent stroke/TIAs, personally independently reviewed imaging studies and stroke evaluation results and answered questions.Continue Eliquis (apixaban) daily  for secondary stroke prevention and maintain strict control of hypertension with blood pressure goal below 130/90, diabetes with hemoglobin A1c goal below 6.5% and lipids with LDL cholesterol goal below 70 mg/dL. I also advised the patient to eat a healthy diet with plenty of whole grains, cereals, fruits and vegetables, exercise regularly and maintain ideal  body weight Followup in the future with me only as necessary and no routine scheduled appointment is necessary. Antony Contras, MD  Note: This document was prepared with digital dictation and possible smart phrase technology. Any transcriptional errors that result from this process are unintentional

## 2016-07-08 LAB — HM DIABETES EYE EXAM

## 2016-08-14 ENCOUNTER — Other Ambulatory Visit: Payer: Self-pay | Admitting: Dermatology

## 2016-08-14 DIAGNOSIS — C4491 Basal cell carcinoma of skin, unspecified: Secondary | ICD-10-CM

## 2016-08-14 HISTORY — DX: Basal cell carcinoma of skin, unspecified: C44.91

## 2016-09-12 ENCOUNTER — Other Ambulatory Visit: Payer: Self-pay | Admitting: Dermatology

## 2016-09-17 NOTE — Progress Notes (Signed)
Chief Complaint  Patient presents with  . Diabetes    fasting med check.     Diabetes. Diabetes was diagnosed 05/2013 based on A1c of 6.7. He has been diet controlled. He was started on metformin in the hospital (03/2014), but it was stopped when they never let him participate in rehab (or made him eat so much to get his sugar up, that he was gaining weight). His wife occasionally checks is sugars, and they are <100. Lab Results  Component Value Date   HGBA1C 6.1 03/14/2016   He has been following a diabetic diet (he limits carbs and sugar in his diet). He goes to the fitness center, getting exercise 3-5x/week.  No polydipsia, polyuria, numbness, tingling, vision or feet concerns.  H/o Iron deficiency, mild anemia: He has been taking iron once daily (gentle iron, plant-based) and last check was normal. Due to recheck today. He is on chronic anticoagulation, denies any bleeding. Lab Results  Component Value Date   WBC 13.3 (H) 01/21/2016   HGB 14.6 01/21/2016   HCT 46.6 01/21/2016   MCV 80.1 01/21/2016   PLT 248 01/21/2016    Hyperlipidemia: He completed the study drug in the Fall 2016 (he believes it was likely placebo, because HDL didn't improve like it was supposed to); he continues on 27m of lipitor. He isn't having side effects or problems.Last lipids were at goal (on lipitor alone). Lab Results  Component Value Date   CHOL 116 (L) 03/14/2016   HDL 39 (L) 03/14/2016   LDLCALC 50 03/14/2016   TRIG 133 03/14/2016   CHOLHDL 3.0 03/14/2016    He saw Dr. SLeonie Man10/2017 for f/u stroke (embolic L occipital infarct in 09/2014. He remains on Eliquis for stroke prevention for atrial fibrillation. He no longer needs to follow there.  Goals should be: BP < 130/90, hemoglobin A1c goal below 6.5% and lipids with LDL cholesterol goal below 70 mg/dL.  Afib, HTN--He doesn't check his blood pressure elsewhere, just at doctor visits. Denies headaches, dizziness, denies any chest pain,  palpitations. Since being on Tikosyn, no longer noticing fib/flutter on machines at the gym (never has symptoms, only sees on machine pulse monitoring). He remains on Eliquis without any bleeding complications. He has f/u with Dr. CStanford Breednext week.  CAD: S/p CABG; stable. Not on aspirin due to being on eliquis.  He continues to have some breathlessness if he walks too far "breathes harder than what I want to".  He breathes harder with less activity than in the past.  No SOB at rest.  But it can be just walking 17 steps to the basement or walking through the house now that causes him to breathe hard. He used to not have any problems with doing 30 minutes of cardio at the gym, using recumbent bike.  Yesterday he had tightness across the chest after 10 minutes on the bike. Described as a burning sensation.  No associated heartburn.  He was fine 2 days ago doing 30 minutes.   He recalls having similar sensation in the past that required additional stenting. He is worried it could be his heart.  He has f/u with cardiologist next week.  Diastolic heart failure--on lasix per cardiologist.  He takes lasix once daily, but takes 2 if weight is up over 2# (weighs daily). He was taken off the losartan and spironolactone due to high potassium levels in the past.  Carotid artery stenosis--dopplers due again 04/2017  OSA: compliant with CPAP use nightly.  GERD--controlled with  meds. He takes the prilosec every other day; sometimes has recurrent heartburn on the days he doesn't take, but if he knows he will be eating something that will trigger it, he takes it anyway. Denies dysphagia. Has a cough that gets up clear mucus a few times/day--chronic and unchanged. He does have allergies, which are controlled with claritin and flonase.  Hypogonadism and BPH: under the care of Dr. Gaynelle Arabian.  He has been back on testosterone injections (for a few years), and doing well. He is s/p TURP.  Denies any urinary  retention.   Obesity:  He didn't pursue nutritionist or any weight loss options (we had discussed Weight Watchers among other things at his last visit). His wife retired 12/31. She will be cooking and preparing foods, and back to a diet that worked for them in the [employer] and portion control.    PMH, PSH, SH reviewed  Outpatient Encounter Prescriptions as of 09/19/2016  Medication Sig Note  . amoxicillin (AMOXIL) 500 MG capsule TAKE 4 CAPSULES BY MOUTH 1 HOUR PRE DENTAL APPOINTMENT 09/19/2016: Received from: External Pharmacy  . apixaban (ELIQUIS) 5 MG TABS tablet Take 1 tablet (5 mg total) by mouth 2 (two) times daily.   Marland Kitchen atorvastatin (LIPITOR) 80 MG tablet Take 1 tablet (80 mg total) by mouth every evening. (Patient taking differently: Take 80 mg by mouth at bedtime. )   . CALCIUM-MAGNESIUM-ZINC PO Take 1 tablet by mouth at bedtime.    . Cholecalciferol (VITAMIN D) 2000 UNITS tablet Take 4,000 Units by mouth at bedtime.    . Coenzyme Q10 (CO Q-10) 100 MG CAPS Take 100 mg by mouth at bedtime.    . dofetilide (TIKOSYN) 500 MCG capsule Take 1 capsule (500 mcg total) by mouth 2 (two) times daily.   Marland Kitchen Fe Bisgly-Vit C-Vit B12-FA (GENTLE IRON PO) Take 1 capsule by mouth at bedtime.   . fluorouracil (EFUDEX) 5 % cream APPLY TO SCALP ONCE DAILY FOR 4 WEEKS 09/19/2016: Received from: External Pharmacy  . fluticasone (FLONASE) 50 MCG/ACT nasal spray USE 2 SPRAYS NASALLY DAILY   . furosemide (LASIX) 40 MG tablet Take 1 tablet (40 mg total) by mouth daily. 09/19/2016: Uses BID only prn with weight gain; otherwise uses once daily  . loratadine (CLARITIN) 10 MG tablet Take 10 mg by mouth at bedtime.    . metoprolol tartrate (LOPRESSOR) 25 MG tablet Take 1 tablet (25 mg total) by mouth 2 (two) times daily.   . Multiple Vitamin (MULTIVITAMIN WITH MINERALS) TABS tablet Take 1 tablet by mouth daily. Centrum silver   . omeprazole (PRILOSEC) 20 MG capsule Take 20 mg by mouth every other day.    .  testosterone cypionate (DEPOTESTOTERONE CYPIONATE) 200 MG/ML injection Inject 200 mg into the muscle every 14 (fourteen) days.  08/24/2015: Sees Dr. Gaynelle Arabian  . acetaminophen (TYLENOL) 500 MG tablet Take 1,000 mg by mouth at bedtime as needed for mild pain.    . Diclofenac Sodium 3 % GEL APPLY UP TO TWICE DAILY AS DIRECTED 03/14/2016: Uses on his head, per dermatologist  . nitroGLYCERIN (NITROSTAT) 0.4 MG SL tablet Place 1 tablet (0.4 mg total) under the tongue every 5 (five) minutes as needed for chest pain. (Patient not taking: Reported on 09/19/2016) 01/15/2016: Never taken   No facility-administered encounter medications on file as of 09/19/2016.    No Known Allergies  ROS:  No fever, chills, URI symptoms.  +chronic cough; DOE as per HPI. Denies heartburn (infrequent).  Denies urinary complaints, no bleeding,  bruising, rash.  Just had a skin cancer removed from his forehead. No orthopnea. intermitent edema Denies depression, anxiety, or other concerns--see HPI.  PHYSICAL EXAM:  BP 130/86 (BP Location: Left Arm, Patient Position: Sitting, Cuff Size: Normal)   Pulse 72   Ht 5' 7"  (1.702 m)   Wt 282 lb 12.8 oz (128.3 kg)   BMI 44.29 kg/m    Pleasant, obese male, in good spirits HEENT: PERRL, EOMI, conjunctiva clear. OP clear. Bandage in center of forehead. Nose--some recent bleeding from right septum, mild edema, no drainage or purulence. Sinuses nontender Neck: no lymphadenopathy or mass Heart:  Regular rate and rhythm, 2-3/6 murmur noted. Lungs: clear bilaterally. No wheezes, rales, ronchi. No elevated JVP, able to lay flat comfortably. Abdomen: obese, soft, no mass. Diastasis recti, nontender Extremities: Trace pretibial edema Psych: normal mood, affect, hygiene and grooming Neuro: alert and oriented. Cranial nerves intact. Normal strength, gait   Prior smoker, age 31-50 Spirometry: Moderately severe restriction   Lab Results  Component Value Date   HGBA1C 5.9 09/19/2016     ASSESSMENT/PLAN:  Essential hypertension - controlled  Pure hypercholesterolemia - tolerating statin, previously at goal - Plan: Lipid panel  Morbid obesity, unspecified obesity type ([place]) - counseled re: weight loss--food choices, portion control. DOE and restriction on spirometry at least partly due to obesity  Gastroesophageal reflux disease, esophagitis presence not specified - trial of daily PPI (up to BID, if needed) to see if DOE improves. diet, weight loss  Coronary artery disease involving native coronary artery of native heart without angina pectoris - DOE concerning also for CAD--f/u as scheduled next week with cardiologist  Obstructive sleep apnea - continue CPAP; weight loss encouraged  Controlled type 2 diabetes mellitus without complication, without long-term current use of insulin ([place]) - diet controlled - Plan: HgB A1c, Microalbumin / creatinine urine ratio  DOE (dyspnea on exertion) - ddx--CAD, obesity, RAD/GERD, CHF--not suspected based on exam. Trial mucinex. fu with cardiology as scheduled - Plan: Brain natriuretic peptide, Spirometry with Graph  Medication monitoring encounter - Plan: Comprehensive metabolic panel, CBC with Differential/Platelet, Ferritin  c-met, lipids, CBC, ferrtin, BNP Urine microalb  Consider CXR (last checked in May)  Fu with Dr. Stanford Breed as scheduled.   I recommend increasing the prilosec to once daily. Consider taking Mucinex once or twice daily (plain guaifenesin)--expectorant to help loosen any phlegm that may be causing chest congestion.  We are checking spirometry today. I will forward your test results to Dr. Stanford Breed

## 2016-09-17 NOTE — Progress Notes (Deleted)
HPI:FU coronary artery disease, hypertension, and hyperlipidemia. Cath 7/15 with severe CAD and EF 50. He underwent bypass grafting x2 with LIMA to the LAD and vein graft to the first obtuse marginal branch of the left circumflex. Abd ultrasound 9/15 showed no aneurysm. Admitted 1/16 with a flutter and new CVA. Treated with apixaban. Carotid dopplers 8/16 showed 40-59 right and 1-39 left. FU recommended 2 years. Echocardiogram March 2017 showed ejection fraction 45-50%, mild left atrial enlargement, mild right ventricular enlargement and mild right atrial enlargement. Aortic valve abnormal but not dopplered.  Since last seen,  Current Outpatient Prescriptions  Medication Sig Dispense Refill  . acetaminophen (TYLENOL) 500 MG tablet Take 1,000 mg by mouth at bedtime as needed for mild pain.     Marland Kitchen apixaban (ELIQUIS) 5 MG TABS tablet Take 1 tablet (5 mg total) by mouth 2 (two) times daily. 180 tablet 3  . atorvastatin (LIPITOR) 80 MG tablet Take 1 tablet (80 mg total) by mouth every evening. (Patient taking differently: Take 80 mg by mouth at bedtime. ) 90 tablet 3  . CALCIUM-MAGNESIUM-ZINC PO Take 1 tablet by mouth at bedtime.     . Cholecalciferol (VITAMIN D) 2000 UNITS tablet Take 4,000 Units by mouth at bedtime.     . Coenzyme Q10 (CO Q-10) 100 MG CAPS Take 100 mg by mouth at bedtime.     . Diclofenac Sodium 3 % GEL APPLY UP TO TWICE DAILY AS DIRECTED  3  . dofetilide (TIKOSYN) 500 MCG capsule Take 1 capsule (500 mcg total) by mouth 2 (two) times daily. 180 capsule 3  . Fe Bisgly-Vit C-Vit B12-FA (GENTLE IRON PO) Take 1 capsule by mouth at bedtime.    . fluticasone (FLONASE) 50 MCG/ACT nasal spray USE 2 SPRAYS NASALLY DAILY 48 g 1  . furosemide (LASIX) 40 MG tablet Take 1 tablet (40 mg total) by mouth daily. 90 tablet 3  . loratadine (CLARITIN) 10 MG tablet Take 10 mg by mouth at bedtime.     . metoprolol tartrate (LOPRESSOR) 25 MG tablet Take 1 tablet (25 mg total) by mouth 2 (two) times  daily. 180 tablet 3  . Multiple Vitamin (MULTIVITAMIN WITH MINERALS) TABS tablet Take 1 tablet by mouth daily. Centrum silver    . nitroGLYCERIN (NITROSTAT) 0.4 MG SL tablet Place 1 tablet (0.4 mg total) under the tongue every 5 (five) minutes as needed for chest pain. 25 tablet 3  . omeprazole (PRILOSEC) 20 MG capsule Take 20 mg by mouth every other day.     . testosterone cypionate (DEPOTESTOTERONE CYPIONATE) 200 MG/ML injection Inject 200 mg into the muscle every 14 (fourteen) days.      No current facility-administered medications for this visit.      Past Medical History:  Diagnosis Date  . Adenomatous colon polyp 01/2015   Dr. Penelope Coop  . Allergy    RHINITIS  . Arthritis   . Atrial fibrillation ([place])   . Benign prostatic hypertrophy    followed by Dr. Gaynelle Arabian; regrowth R lat lob (11/2015)  . CAD (coronary artery disease)   . Cataracts, bilateral   . Cerebrovascular disease   . Diverticular disease   . Diverticulosis    seen on colonoscopy (9/05, 06/2009)  . Erectile dysfunction   . Family history of adverse reaction to anesthesia    mother had memeory problems after anesthesia with hip replacement, lasted until she died 2 to 3 years later  . Gallstones   . GERD (gastroesophageal reflux disease)   .  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 Hepatitis age 42   "called yellow jaundice"  . HTN (hypertension)   . Hyperlipidemia   . Hyperplastic colon polyp 05/2004   tubular adenoma 01/2015  . Hypogonadism male    treated by Dr. Gaynelle Arabian  . Myocardial infarction '98, '09   (Dr. Stanford Breed); s/p stents  . Nocturia   . Obesity   . Peripheral vascular disease ([place])    02/28/12 MOST RECENT CAROTID DUPLEX--"STABLE MILD CALCIFIED PLAQUE, BILATERALY, STABLE, OVER SERIAL EXAMS"  . Pneumonia age 20   hx of  . Prostatitis   . 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 Sepsis due to Escherichia coli [facility]) ? 2011   DUE TO  URINARY OUTLET OBSTRUCTION /INFECTION  . Shortness of breath dyspnea    with exertion only  . Sleep apnea    mild, CPAP wasn't indicated (per pt)--2014 mod-severe, on CPAP since 07/2013  . Stroke [facility]) jan 2016  . UTI (urinary tract infection)    secondary  . Visit for monitoring Tikosyn therapy     Past Surgical History:  Procedure Laterality Date  . CORONARY ANGIOPLASTY WITH STENT PLACEMENT  01/1997, stent x 1, 2009 2nd stent in same location   PTCA/stent to RCA  AND 2009 2ND STENT PLACED  . CORONARY ARTERY BYPASS GRAFT N/A 04/15/2014   Procedure: CORONARY ARTERY BYPASS GRAFTING TIMES TWO USEING LEFT INTERNAL MAMMARY ARTERY AND RIGHT GREATER SAPHENOUS VEIN VIA ENDOVEIN HARVEST;  Surgeon: Rexene Alberts, MD;  Location: [place];  Service: Open Heart Surgery;  Laterality: N/A;  . CYSTOSCOPY  11/17/15   Tannenbaum  . GREEN LIGHT LASER TURP (TRANSURETHRAL RESECTION OF PROSTATE N/A 01/15/2016   Procedure: GREEN LIGHT LASER TURP (TRANSURETHRAL RESECTION OF PROSTATE;  Surgeon: Carolan Clines, MD;  [facility];  Service: Urology;  Laterality: N/A;  LASER RIGHT LATERAL LOBE OF PROSTATE AND LEFT BLADDER NECK    . INTRAOPERATIVE TRANSESOPHAGEAL ECHOCARDIOGRAM N/A 04/15/2014   Procedure: INTRAOPERATIVE TRANSESOPHAGEAL ECHOCARDIOGRAM;  Surgeon: Rexene Alberts, MD;  Location: [place];  Service: Open Heart Surgery;  Laterality: N/A;  . JOINT REPLACEMENT     both knees  . KNEE SURGERY  05/2010   removing scar tissue R knee (Dr. Wynelle Link)  . LEFT AND RIGHT HEART CATHETERIZATION WITH CORONARY ANGIOGRAM  04/13/2014   Procedure: LEFT AND RIGHT HEART CATHETERIZATION WITH CORONARY ANGIOGRAM;  Surgeon: Sinclair Grooms, MD;  Location[name] C[facility] LAB;  Service: Cardiovascular;;  . TONSILLECTOMY    . TOTAL KNEE ARTHROPLASTY  L 4/06, R 4/07  . TOTAL KNEE REVISION  09/23/2012   Procedure: TOTAL KNEE REVISION;  Surgeon: Gearlean Alf, MD; right side  [facility];  Service: Orthopedics;  Laterality: Right;    . TRANSURETHRAL RESECTION OF PROSTATE  06/2010  . VASECTOMY      Social History   Social History  . Marital status: Married    Spouse name: N/A  . Number of children: 4  . Years of education: N/A   Occupational History  . CPA    Social History Main Topics  . Smoking status: Former Smoker    Packs/day: 1.50    Years: 30.00    Types: Cigarettes    Quit date: 09/16/1986  . Smokeless tobacco: Never Used     Comment: 35 pack year history, none in 18 years  .  Alcohol use 0.6 oz/week    1 Glasses of wine per week     Comment: 1 glass of wine three times a week (previously daily)  . Drug use: No  . Sexual activity: Not Currently   Other Topics Concern  . Not on file   Social History Narrative   Lives with wife.  4 kids, 1 stepchild (1 in [place], 4 in [place]). 11 grandchildren, 1 great grandchild.   No pets. Wife is a diabetic.   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 Coronary artery disease Mother     deceased  . Dementia Mother   . Atrial fibrillation Mother   . Stroke Mother     related to atrial fib  . Coronary artery disease Father     deceased  . Hypertension Father   . Heart disease Father   . Hypertension Paternal Grandmother   . Heart disease Paternal Grandmother   . Hypertension Paternal Grandfather   . Heart disease Paternal Grandfather   . Hypothyroidism Daughter     congenital  . Cancer Paternal Uncle     colon; died in his 26's  . Colon cancer Paternal Uncle   . Diabetes Neg Hx     ROS: no fevers or chills, productive cough, hemoptysis, dysphasia, odynophagia, melena, hematochezia, dysuria, hematuria, rash, seizure activity, orthopnea, PND, pedal edema, claudication. Remaining systems are negative.  Physical Exam: Well-developed well-nourished in no acute distress.  Skin is warm and dry.  HEENT is normal.  Neck is supple.  Chest is clear to auscultation with normal expansion.   Cardiovascular exam is regular rate and rhythm.  Abdominal exam nontender or distended. No masses palpated. Extremities show no edema. neuro grossly intact  ECG

## 2016-09-19 ENCOUNTER — Encounter: Payer: Self-pay | Admitting: Family Medicine

## 2016-09-19 ENCOUNTER — Ambulatory Visit (INDEPENDENT_AMBULATORY_CARE_PROVIDER_SITE_OTHER): Payer: Medicare Other | Admitting: Family Medicine

## 2016-09-19 VITALS — BP 130/86 | HR 72 | Ht 67.0 in | Wt 282.8 lb

## 2016-09-19 DIAGNOSIS — K219 Gastro-esophageal reflux disease without esophagitis: Secondary | ICD-10-CM | POA: Diagnosis not present

## 2016-09-19 DIAGNOSIS — I10 Essential (primary) hypertension: Secondary | ICD-10-CM

## 2016-09-19 DIAGNOSIS — E7800 Pure hypercholesterolemia, unspecified: Secondary | ICD-10-CM

## 2016-09-19 DIAGNOSIS — Z5181 Encounter for therapeutic drug level monitoring: Secondary | ICD-10-CM

## 2016-09-19 DIAGNOSIS — I2510 Atherosclerotic heart disease of native coronary artery without angina pectoris: Secondary | ICD-10-CM | POA: Diagnosis not present

## 2016-09-19 DIAGNOSIS — R0600 Dyspnea, unspecified: Secondary | ICD-10-CM

## 2016-09-19 DIAGNOSIS — E119 Type 2 diabetes mellitus without complications: Secondary | ICD-10-CM

## 2016-09-19 DIAGNOSIS — G4733 Obstructive sleep apnea (adult) (pediatric): Secondary | ICD-10-CM | POA: Diagnosis not present

## 2016-09-19 DIAGNOSIS — R0609 Other forms of dyspnea: Secondary | ICD-10-CM

## 2016-09-19 LAB — COMPREHENSIVE METABOLIC PANEL
ALT: 46 U/L (ref 9–46)
AST: 31 U/L (ref 10–35)
Albumin: 4.1 g/dL (ref 3.6–5.1)
Alkaline Phosphatase: 43 U/L (ref 40–115)
BUN: 14 mg/dL (ref 7–25)
CALCIUM: 9.5 mg/dL (ref 8.6–10.3)
CHLORIDE: 102 mmol/L (ref 98–110)
CO2: 26 mmol/L (ref 20–31)
Creat: 1 mg/dL (ref 0.70–1.18)
GLUCOSE: 92 mg/dL (ref 65–99)
POTASSIUM: 4.5 mmol/L (ref 3.5–5.3)
Sodium: 141 mmol/L (ref 135–146)
Total Bilirubin: 1.2 mg/dL (ref 0.2–1.2)
Total Protein: 6.7 g/dL (ref 6.1–8.1)

## 2016-09-19 LAB — LIPID PANEL
CHOL/HDL RATIO: 3.4 ratio (ref <5.0)
CHOLESTEROL: 127 mg/dL (ref <200)
HDL: 37 mg/dL — AB (ref >40)
LDL Cholesterol: 56 mg/dL (ref <100)
TRIGLYCERIDES: 170 mg/dL — AB (ref <150)
VLDL: 34 mg/dL — AB (ref <30)

## 2016-09-19 LAB — CBC WITH DIFFERENTIAL/PLATELET
BASOS ABS: 0 {cells}/uL (ref 0–200)
BASOS PCT: 0 %
EOS ABS: 110 {cells}/uL (ref 15–500)
Eosinophils Relative: 1 %
HCT: 55.2 % — ABNORMAL HIGH (ref 38.5–50.0)
HEMOGLOBIN: 18.3 g/dL — AB (ref 13.2–17.1)
LYMPHS ABS: 1760 {cells}/uL (ref 850–3900)
Lymphocytes Relative: 16 %
MCH: 29.6 pg (ref 27.0–33.0)
MCHC: 33.2 g/dL (ref 32.0–36.0)
MCV: 89.2 fL (ref 80.0–100.0)
MPV: 11 fL (ref 7.5–12.5)
Monocytes Absolute: 1210 cells/uL — ABNORMAL HIGH (ref 200–950)
Monocytes Relative: 11 %
NEUTROS ABS: 7920 {cells}/uL — AB (ref 1500–7800)
Neutrophils Relative %: 72 %
Platelets: 161 10*3/uL (ref 140–400)
RBC: 6.19 MIL/uL — ABNORMAL HIGH (ref 4.20–5.80)
RDW: 17.6 % — ABNORMAL HIGH (ref 11.0–15.0)
WBC: 11 10*3/uL — ABNORMAL HIGH (ref 4.0–10.5)

## 2016-09-19 LAB — FERRITIN: FERRITIN: 22 ng/mL (ref 20–380)

## 2016-09-19 LAB — POCT GLYCOSYLATED HEMOGLOBIN (HGB A1C): Hemoglobin A1C: 5.9

## 2016-09-19 NOTE — Patient Instructions (Signed)
  I recommend increasing the prilosec to once daily. Consider taking Mucinex once or twice daily (plain guaifenesin)--expectorant to help loosen any phlegm that may be causing chest congestion.  We are checking spirometry today. I will forward your test results to Dr. Stanford Breed

## 2016-09-20 LAB — MICROALBUMIN / CREATININE URINE RATIO
Creatinine, Urine: 199 mg/dL (ref 20–370)
MICROALB UR: 9.8 mg/dL
MICROALB/CREAT RATIO: 49 ug/mg{creat} — AB (ref <30)

## 2016-09-20 LAB — BRAIN NATRIURETIC PEPTIDE: BRAIN NATRIURETIC PEPTIDE: 115.5 pg/mL — AB (ref <100)

## 2016-09-24 ENCOUNTER — Ambulatory Visit: Payer: 59 | Admitting: Cardiology

## 2016-09-26 ENCOUNTER — Encounter: Payer: Self-pay | Admitting: Cardiology

## 2016-09-26 ENCOUNTER — Ambulatory Visit (INDEPENDENT_AMBULATORY_CARE_PROVIDER_SITE_OTHER): Payer: Medicare Other | Admitting: Cardiology

## 2016-09-26 VITALS — BP 146/80 | HR 73 | Ht 67.0 in | Wt 281.0 lb

## 2016-09-26 DIAGNOSIS — I2510 Atherosclerotic heart disease of native coronary artery without angina pectoris: Secondary | ICD-10-CM | POA: Diagnosis not present

## 2016-09-26 DIAGNOSIS — R072 Precordial pain: Secondary | ICD-10-CM | POA: Diagnosis not present

## 2016-09-26 DIAGNOSIS — I4891 Unspecified atrial fibrillation: Secondary | ICD-10-CM

## 2016-09-26 DIAGNOSIS — I679 Cerebrovascular disease, unspecified: Secondary | ICD-10-CM | POA: Diagnosis not present

## 2016-09-26 MED ORDER — ATORVASTATIN CALCIUM 80 MG PO TABS: 80.0000 mg | ORAL_TABLET | Freq: Every evening | ORAL | 3 refills | Status: DC

## 2016-09-26 MED ORDER — NITROGLYCERIN 0.4 MG SL SUBL: 0.4000 mg | SUBLINGUAL_TABLET | SUBLINGUAL | 12 refills | Status: DC | PRN

## 2016-09-26 MED ORDER — FUROSEMIDE 40 MG PO TABS: 60.0000 mg | ORAL_TABLET | Freq: Every day | ORAL | 3 refills | Status: DC

## 2016-09-26 MED ORDER — METOPROLOL TARTRATE 25 MG PO TABS: 25.0000 mg | ORAL_TABLET | Freq: Two times a day (BID) | ORAL | 3 refills | Status: DC

## 2016-09-26 MED ORDER — LOSARTAN POTASSIUM 50 MG PO TABS: 50.0000 mg | ORAL_TABLET | Freq: Every day | ORAL | 3 refills | Status: DC

## 2016-09-26 NOTE — Patient Instructions (Signed)
Medication Instructions:   START LOSARTAN 50 MG ONCE DAILY  INCREASE FUROSEMIDE TO 60 MG ONCE DAILY= 1 AND 1/2 OF THE 40 MG TABLET ONCE DAILY  Labwork:  Your physician recommends that you RETURN FOR LAB WORK IN ONE WEEK  Testing/Procedures:  Your physician has requested that you have a lexiscan myoview. For further information please visit [email or web address]. Please follow instruction sheet, as given.   Your physician has requested that you have a carotid duplex. This test is an ultrasound of the carotid arteries in your neck. It looks at blood flow through these arteries that supply the brain with blood. Allow one hour for this exam. There are no restrictions or special instructions.    Follow-Up:  Your physician recommends that you schedule a follow-up appointment in: [place]

## 2016-09-26 NOTE — Progress Notes (Signed)
HPI: FU coronary artery disease, PAF, hypertension, and hyperlipidemia. Cath 7/15 with severe CAD and EF 50. He underwent bypass grafting x2 with LIMA to the LAD and vein graft to the first obtuse marginal branch of the left circumflex. Abd ultrasound 9/15 showed no aneurysm. Admitted 1/16 with a flutter and new CVA. Treated with apixaban. Carotid dopplers 8/16 showed 40-59 right and 1-39 left. FU recommended 2 years. Patient recently presented in January and electrocardiogram showed recurrent atrial fibrillation. Echocardiogram March 2017 showed ejection fraction 45-50%, mild left atrial enlargement, mild right ventricular enlargement and mild right atrial enlargement. Aortic valve abnormal but not dopplered. Recently admitted and placed on tikosyn. Since last seen, patient notes some dyspnea on exertion. He had chest tightness with riding a recumbent bike vigorously resolved with rest. This does not happen with routine activities. He does have pedal edema. No palpitations or syncope. No bleeding.  Current Outpatient Prescriptions  Medication Sig Dispense Refill  . acetaminophen (TYLENOL) 500 MG tablet Take 1,000 mg by mouth at bedtime as needed for mild pain.     Marland Kitchen amoxicillin (AMOXIL) 500 MG capsule TAKE 4 CAPSULES BY MOUTH 1 HOUR PRE DENTAL APPOINTMENT  1  . apixaban (ELIQUIS) 5 MG TABS tablet Take 1 tablet (5 mg total) by mouth 2 (two) times daily. 180 tablet 3  . atorvastatin (LIPITOR) 80 MG tablet Take 1 tablet (80 mg total) by mouth every evening. (Patient taking differently: Take 80 mg by mouth at bedtime. ) 90 tablet 3  . CALCIUM-MAGNESIUM-ZINC PO Take 1 tablet by mouth at bedtime.     . Cholecalciferol (VITAMIN D) 2000 UNITS tablet Take 4,000 Units by mouth at bedtime.     . Coenzyme Q10 (CO Q-10) 100 MG CAPS Take 100 mg by mouth at bedtime.     . Diclofenac Sodium 3 % GEL APPLY UP TO TWICE DAILY AS DIRECTED  3  . dofetilide (TIKOSYN) 500 MCG capsule Take 1 capsule (500 mcg total) by  mouth 2 (two) times daily. 180 capsule 3  . Fe Bisgly-Vit C-Vit B12-FA (GENTLE IRON PO) Take 1 capsule by mouth at bedtime.    . fluorouracil (EFUDEX) 5 % cream APPLY TO SCALP ONCE DAILY FOR 4 WEEKS  1  . fluticasone (FLONASE) 50 MCG/ACT nasal spray USE 2 SPRAYS NASALLY DAILY 48 g 1  . furosemide (LASIX) 40 MG tablet Take 1 tablet (40 mg total) by mouth daily. 90 tablet 3  . loratadine (CLARITIN) 10 MG tablet Take 10 mg by mouth at bedtime.     . metoprolol tartrate (LOPRESSOR) 25 MG tablet Take 1 tablet (25 mg total) by mouth 2 (two) times daily. 180 tablet 3  . Multiple Vitamin (MULTIVITAMIN WITH MINERALS) TABS tablet Take 1 tablet by mouth daily. Centrum silver    . nitroGLYCERIN (NITROSTAT) 0.4 MG SL tablet Place 1 tablet (0.4 mg total) under the tongue every 5 (five) minutes as needed for chest pain. 25 tablet 3  . omeprazole (PRILOSEC) 20 MG capsule Take 20 mg by mouth every other day.     . testosterone cypionate (DEPOTESTOTERONE CYPIONATE) 200 MG/ML injection Inject 200 mg into the muscle every 14 (fourteen) days.      No current facility-administered medications for this visit.      Past Medical History:  Diagnosis Date  . Adenomatous colon polyp 01/2015   Dr. Penelope Coop  . Allergy    RHINITIS  . Arthritis   . Atrial fibrillation ([place])   . Benign prostatic  hypertrophy    followed by Dr. Gaynelle Arabian; regrowth R lat lob (11/2015)  . CAD (coronary artery disease)   . Cataracts, bilateral   . Cerebrovascular disease   . Diverticular disease   . Diverticulosis    seen on colonoscopy (9/05, 06/2009)  . Erectile dysfunction   . Family history of adverse reaction to anesthesia    mother had memeory problems after anesthesia with hip replacement, lasted until she died 2 to 3 years later  . Gallstones   . GERD (gastroesophageal reflux disease)   . 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 Hepatitis age 34   "called yellow jaundice"  . HTN (hypertension)   . Hyperlipidemia     . Hyperplastic colon polyp 05/2004   tubular adenoma 01/2015  . Hypogonadism male    treated by Dr. Gaynelle Arabian  . Myocardial infarction '98, '09   (Dr. Stanford Breed); s/p stents  . Nocturia   . Obesity   . Peripheral vascular disease ([place])    02/28/12 MOST RECENT CAROTID DUPLEX--"STABLE MILD CALCIFIED PLAQUE, BILATERALY, STABLE, OVER SERIAL EXAMS"  . Pneumonia age 33   hx of  . Prostatitis   . 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 Sepsis due to Escherichia coli [facility]) ? 2011   DUE TO URINARY OUTLET OBSTRUCTION /INFECTION  . Shortness of breath dyspnea    with exertion only  . Sleep apnea    mild, CPAP wasn't indicated (per pt)--2014 mod-severe, on CPAP since 07/2013  . Stroke [facility]) jan 2016  . UTI (urinary tract infection)    secondary  . Visit for monitoring Tikosyn therapy     Past Surgical History:  Procedure Laterality Date  . CORONARY ANGIOPLASTY WITH STENT PLACEMENT  01/1997, stent x 1, 2009 2nd stent in same location   PTCA/stent to RCA  AND 2009 2ND STENT PLACED  . CORONARY ARTERY BYPASS GRAFT N/A 04/15/2014   Procedure: CORONARY ARTERY BYPASS GRAFTING TIMES TWO USEING LEFT INTERNAL MAMMARY ARTERY AND RIGHT GREATER SAPHENOUS VEIN VIA ENDOVEIN HARVEST;  Surgeon: Rexene Alberts, MD;  Location: [place];  Service: Open Heart Surgery;  Laterality: N/A;  . CYSTOSCOPY  11/17/15   Tannenbaum  . GREEN LIGHT LASER TURP (TRANSURETHRAL RESECTION OF PROSTATE N/A 01/15/2016   Procedure: GREEN LIGHT LASER TURP (TRANSURETHRAL RESECTION OF PROSTATE;  Surgeon: Carolan Clines, MD;  [facility];  Service: Urology;  Laterality: N/A;  LASER RIGHT LATERAL LOBE OF PROSTATE AND LEFT BLADDER NECK    . INTRAOPERATIVE TRANSESOPHAGEAL ECHOCARDIOGRAM N/A 04/15/2014   Procedure: INTRAOPERATIVE TRANSESOPHAGEAL ECHOCARDIOGRAM;  Surgeon: Rexene Alberts, MD;  Location: [place];  Service: Open Heart Surgery;  Laterality: N/A;  . JOINT REPLACEMENT      both knees  . KNEE SURGERY  05/2010   removing scar tissue R knee (Dr. Wynelle Link)  . LEFT AND RIGHT HEART CATHETERIZATION WITH CORONARY ANGIOGRAM  04/13/2014   Procedure: LEFT AND RIGHT HEART CATHETERIZATION WITH CORONARY ANGIOGRAM;  Surgeon: Sinclair Grooms, MD;  Location: [facility] CATH LAB;  Service: Cardiovascular;;  . TONSILLECTOMY    . TOTAL KNEE ARTHROPLASTY  L 4/06, R 4/07  . TOTAL KNEE REVISION  09/23/2012   Procedure: TOTAL KNEE REVISION;  Surgeon: Gearlean Alf, MD; right side  [facility];  Service: Orthopedics;  Laterality: Right;  . TRANSURETHRAL RESECTION OF PROSTATE  06/2010  . VASECTOMY  Social History   Social History  . Marital status: Married    Spouse name: N/A  . Number of children: 4  . Years of education: N/A   Occupational History  . CPA    Social History Main Topics  . Smoking status: Former Smoker    Packs/day: 1.50    Years: 30.00    Types: Cigarettes    Quit date: 09/16/1986  . Smokeless tobacco: Never Used     Comment: 35 pack year history, none in 18 years  . Alcohol use 0.6 oz/week    1 Glasses of wine per week     Comment: 1 glass of wine three times a week (previously daily)  . Drug use: No  . Sexual activity: Not Currently   Other Topics Concern  . Not on file   Social History Narrative   Lives with wife.  4 kids, 1 stepchild (1 in [place], 4 in [place]). 11 grandchildren, 1 great grandchild.   No pets. Wife is a diabetic.   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 Coronary artery disease Mother     deceased  . Dementia Mother   . Atrial fibrillation Mother   . Stroke Mother     related to atrial fib  . Coronary artery disease Father     deceased  . Hypertension Father   . Heart disease Father   . Hypertension Paternal Grandmother   . Heart disease Paternal Grandmother   . Hypertension Paternal Grandfather   . Heart disease Paternal Grandfather   . Hypothyroidism Daughter      congenital  . Cancer Paternal Uncle     colon; died in his 75's  . Colon cancer Paternal Uncle   . Diabetes Neg Hx     [id number] edema but  no fevers or chills, productive cough, hemoptysis, dysphasia, odynophagia, melena, hematochezia, dysuria, hematuria, rash, seizure activity, orthopnea, PND, claudication. Remaining systems are negative.  Physical Exam: Well-developed obese in no acute distress.  Skin is warm and dry.  HEENT is normal.  Neck is supple.  Chest is clear to auscultation with normal expansion.  Cardiovascular exam is regular rate and rhythm. 2/6 systolic murmur, S2 not diminishsed Abdominal exam nontender or distended. No masses palpated. Extremities show 1+ edema. neuro grossly intact  ECG-sinus rhythm at a rate of 73. Normal axis. Anterior infarct.  A/P  1 coronary artery disease-continue statin. No aspirin given need for anticoagulation. Patient describes mild chest tightness with vigorous activities. Schedule nuclear study for risk stratification.   2 carotid artery disease-schedule follow-up carotid Dopplers.  3 hypertension-blood pressure elevated. Add cozaar 50 mg daily; check K and renal function one week.  4 hyperlipidemia-continue statin.  5 chronic diastolic congestive heart failure-increase Lasix to 60 mg daily.   6 aortic stenosis-will arrange follow-up echocardiogram in the future; mild on exam.  7 paroxysmal atrial fibrillation-patient remains in sinus rhythm. Continue tikosyn, metoprolol and apixaban.   Kirk Ruths, MD

## 2016-09-27 ENCOUNTER — Telehealth: Payer: Self-pay | Admitting: Cardiology

## 2016-09-27 NOTE — Telephone Encounter (Signed)
Spoke w/pt.  His wife retired end of 08/2016.  His [employer] will be COBRA.  COBRA premium paid.  Awaiting policy to be activated.  Pt has MCR A and [employer]-COBRA.  Pt to bring card/notify office when he receives his card w/policy#.

## 2016-10-07 LAB — BASIC METABOLIC PANEL
BUN/Creatinine Ratio: 16 (ref 10–24)
BUN: 16 mg/dL (ref 8–27)
CALCIUM: 9.8 mg/dL (ref 8.6–10.2)
CO2: 34 mmol/L — AB (ref 18–29)
CREATININE: 1.03 mg/dL (ref 0.76–1.27)
Chloride: 100 mmol/L (ref 96–106)
GFR calc Af Amer: 82 mL/min/{1.73_m2} (ref >59)
GFR calc non Af Amer: 71 mL/min/{1.73_m2} (ref >59)
GLUCOSE: 76 mg/dL (ref 65–99)
POTASSIUM: 4.2 mmol/L (ref 3.5–5.2)
SODIUM: 138 mmol/L (ref 134–144)

## 2016-10-09 ENCOUNTER — Telehealth (HOSPITAL_COMMUNITY): Payer: Self-pay

## 2016-10-09 NOTE — Telephone Encounter (Signed)
Encounter complete. 

## 2016-10-11 ENCOUNTER — Ambulatory Visit (HOSPITAL_COMMUNITY)
Admission: RE | Admit: 2016-10-11 | Discharge: 2016-10-11 | Disposition: A | Discharge status: Home / Self Care | Payer: Medicare Other | Source: Ambulatory Visit | Attending: Cardiology | Admitting: Cardiology

## 2016-10-11 ENCOUNTER — Ambulatory Visit (HOSPITAL_COMMUNITY)
Admission: RE | Admit: 2016-10-11 | Discharge: 2016-10-11 | Disposition: A | Discharge status: Home / Self Care | Payer: 59 | Source: Ambulatory Visit | Attending: Cardiology | Admitting: Cardiology

## 2016-10-11 DIAGNOSIS — R072 Precordial pain: Secondary | ICD-10-CM | POA: Diagnosis not present

## 2016-10-11 DIAGNOSIS — I6523 Occlusion and stenosis of bilateral carotid arteries: Secondary | ICD-10-CM | POA: Diagnosis not present

## 2016-10-11 DIAGNOSIS — I252 Old myocardial infarction: Secondary | ICD-10-CM | POA: Diagnosis not present

## 2016-10-11 DIAGNOSIS — I679 Cerebrovascular disease, unspecified: Secondary | ICD-10-CM | POA: Diagnosis present

## 2016-10-11 LAB — MYOCARDIAL PERFUSION IMAGING
CHL CUP NUCLEAR SDS: 8
CHL CUP NUCLEAR SSS: 18
CHL CUP RESTING HR STRESS: 62 {beats}/min
LV sys vol: 106 mL
LVDIAVOL: 170 mL (ref 62–150)
NUC STRESS TID: 1.25
Peak HR: 72 {beats}/min
SRS: 10

## 2016-10-11 MED ORDER — TECHNETIUM TC 99M TETROFOSMIN IV KIT
30.2000 | PACK | Freq: Once | INTRAVENOUS | Status: AC | PRN
  Administered 2016-10-11: 30.2 via INTRAVENOUS
  Filled 2016-10-11: qty 31

## 2016-10-11 MED ORDER — REGADENOSON 0.4 MG/5ML IV SOLN
0.4000 mg | Freq: Once | INTRAVENOUS | Status: AC
  Administered 2016-10-11: 0.4 mg via INTRAVENOUS

## 2016-10-11 MED ORDER — AMINOPHYLLINE 25 MG/ML IV SOLN
75.0000 mg | Freq: Once | INTRAVENOUS | Status: AC
  Administered 2016-10-11: 75 mg via INTRAVENOUS

## 2016-10-11 MED ORDER — TECHNETIUM TC 99M TETROFOSMIN IV KIT
10.6000 | PACK | Freq: Once | INTRAVENOUS | Status: AC | PRN
  Administered 2016-10-11: 10.6 via INTRAVENOUS
  Filled 2016-10-11: qty 11

## 2016-10-31 ENCOUNTER — Other Ambulatory Visit: Payer: Self-pay | Admitting: Urology

## 2016-10-31 DIAGNOSIS — D751 Secondary polycythemia: Secondary | ICD-10-CM

## 2016-11-06 ENCOUNTER — Telehealth: Payer: Self-pay | Admitting: Cardiology

## 2016-11-06 NOTE — Telephone Encounter (Signed)
Make sure they know he is on eliquis; doubt he will need asa Kirk Ruths

## 2016-11-06 NOTE — Telephone Encounter (Signed)
Returned call to patient-patient states he is scheduled for "therapeutic phelbotomy" due to high hemoglobin of 18.0.  Patient reports he received call from Dr. Arlyn Leak office informing him to take a baby asa daily in preparation for upcoming procedure.  Patient states he "only cares about what Dr. Stanford Breed thinks" and would like to know if he is okay with him taking ASA 81 daily in addition to Eliquis and if he should have the "therapeutic phlebotomy" done.    Asked patient if the office was aware of his current medication eliquis and if they were wanting this in addition to this or if they are unaware he is on Eliquis and were wanting him to take aspirin.  Patient unaware.    Advised I would route to MD for recommendations.

## 2016-11-06 NOTE — Telephone Encounter (Signed)
Spoke with pt, Aware of dr crenshaw's recommendations.  °

## 2016-11-06 NOTE — Telephone Encounter (Signed)
New message    Pt states that PCP is recommending that he take a "baby aspirin" because his hemoglobin is too high. And he does not want to take this drug because of his Eloquis. He states that he needed advice on if this would counteract with any of his current meds and if so, what would happen if he did not take the recommended drug.

## 2016-11-15 NOTE — Progress Notes (Signed)
HPI: FU coronary artery disease, PAF, hypertension, and hyperlipidemia. Cath 7/15 with severe CAD and EF 50. He underwent bypass grafting x2 with LIMA to the LAD and vein graft to the first obtuse marginal branch of the left circumflex. Abd ultrasound 9/15 showed no aneurysm. Also with h/o aflutter, PAF and CVA. Echocardiogram March 2017 showed ejection fraction 45-50%, mild left atrial enlargement, mild right ventricular enlargement and mild right atrial enlargement. Aortic valve abnormal but not dopplered. Nuclear study 1/18 showed EF 38, inferior MI and mild peri-infarct ischemia. Carotid dopplers 1/18 showed 1-39 bilateral stenosis. Since last seen, patient has some dyspnea on exertion but no orthopnea, PND, chest pain or syncope. Minimal pedal edema.  Current Outpatient Prescriptions  Medication Sig Dispense Refill  . acetaminophen (TYLENOL) 500 MG tablet Take 1,000 mg by mouth at bedtime as needed for mild pain.     Marland Kitchen amoxicillin (AMOXIL) 500 MG capsule TAKE 4 CAPSULES BY MOUTH 1 HOUR PRE DENTAL APPOINTMENT  1  . atorvastatin (LIPITOR) 80 MG tablet Take 1 tablet (80 mg total) by mouth every evening. 90 tablet 3  . CALCIUM-MAGNESIUM-ZINC PO Take 1 tablet by mouth at bedtime.     . Cholecalciferol (VITAMIN D) 2000 UNITS tablet Take 4,000 Units by mouth at bedtime.     . Coenzyme Q10 (CO Q-10) 100 MG CAPS Take 100 mg by mouth at bedtime.     . Diclofenac Sodium 3 % GEL APPLY UP TO TWICE DAILY AS DIRECTED  3  . dofetilide (TIKOSYN) 500 MCG capsule Take 1 capsule (500 mcg total) by mouth 2 (two) times daily. 180 capsule 3  . ELIQUIS 5 MG TABS tablet TAKE 1 TABLET BY MOUTH TWICE A DAY 180 tablet 1  . fluticasone (FLONASE) 50 MCG/ACT nasal spray USE 2 SPRAYS NASALLY DAILY 48 g 1  . furosemide (LASIX) 40 MG tablet Take 1.5 tablets (60 mg total) by mouth daily. 135 tablet 3  . loratadine (CLARITIN) 10 MG tablet Take 10 mg by mouth at bedtime.     Marland Kitchen losartan (COZAAR) 50 MG tablet Take 1 tablet  (50 mg total) by mouth daily. 90 tablet 3  . metoprolol tartrate (LOPRESSOR) 25 MG tablet Take 1 tablet (25 mg total) by mouth 2 (two) times daily. 180 tablet 3  . Multiple Vitamin (MULTIVITAMIN WITH MINERALS) TABS tablet Take 1 tablet by mouth daily. Centrum silver    . nitroGLYCERIN (NITROSTAT) 0.4 MG SL tablet Place 1 tablet (0.4 mg total) under the tongue every 5 (five) minutes as needed for chest pain. 25 tablet 12  . omeprazole (PRILOSEC) 20 MG capsule Take 20 mg by mouth every other day.     . testosterone cypionate (DEPOTESTOTERONE CYPIONATE) 200 MG/ML injection Inject 200 mg into the muscle every 14 (fourteen) days.      No current facility-administered medications for this visit.      Past Medical History:  Diagnosis Date  . Adenomatous colon polyp 01/2015   Dr. Penelope Coop  . Allergy    RHINITIS  . Arthritis   . Atrial fibrillation ([place])   . Benign prostatic hypertrophy    followed by Dr. Gaynelle Arabian; regrowth R lat lob (11/2015)  . CAD (coronary artery disease)   . Cataracts, bilateral   . Cerebrovascular disease   . Diverticular disease   . Diverticulosis    seen on colonoscopy (9/05, 06/2009)  . Erectile dysfunction   . Family history of adverse reaction to anesthesia    mother had memeory problems  after anesthesia with hip replacement, lasted until she died 2 to 3 years later  . Gallstones   . GERD (gastroesophageal reflux disease)   . 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 Hepatitis age 82   "called yellow jaundice"  . HTN (hypertension)   . Hyperlipidemia   . Hyperplastic colon polyp 05/2004   tubular adenoma 01/2015  . Hypogonadism male    treated by Dr. Gaynelle Arabian  . Myocardial infarction '98, '09   (Dr. Stanford Breed); s/p stents  . Nocturia   . Obesity   . Peripheral vascular disease ([place])    02/28/12 MOST RECENT CAROTID DUPLEX--"STABLE MILD CALCIFIED PLAQUE, BILATERALY, STABLE, OVER SERIAL EXAMS"  . Pneumonia age 24   hx of  . Prostatitis   . S/P  CABG x 2 04/15/2014   LIMA to LAD, SVG to OM1, EVH via right thigh, cabg was x 3 per pt  . Sepsis due to Escherichia coli [facility]) ? 2011   DUE TO URINARY OUTLET OBSTRUCTION /INFECTION  . Shortness of breath dyspnea    with exertion only  . Sleep apnea    mild, CPAP wasn't indicated (per pt)--2014 mod-severe, on CPAP since 07/2013  . Stroke [facility]) jan 2016  . UTI (urinary tract infection)    secondary  . Visit for monitoring Tikosyn therapy     Past Surgical History:  Procedure Laterality Date  . CORONARY ANGIOPLASTY WITH STENT PLACEMENT  01/1997, stent x 1, 2009 2nd stent in same location   PTCA/stent to RCA  AND 2009 2ND STENT PLACED  . CORONARY ARTERY BYPASS GRAFT N/A 04/15/2014   Procedure: CORONARY ARTERY BYPASS GRAFTING TIMES TWO USEING LEFT INTERNAL MAMMARY ARTERY AND RIGHT GREATER SAPHENOUS VEIN VIA ENDOVEIN HARVEST;  Surgeon: Rexene Alberts, MD;  Location: [place];  Service: Open Heart Surgery;  Laterality: N/A;  . CYSTOSCOPY  11/17/15   [facility]  . GREEN LIGHT LASER TURP (TRANSURETHRAL RESECTION OF PROSTATE N/A 01/15/2016   Procedure: GREEN LIGHT LASER TURP (TRANSURETHRAL RESECTION OF PROSTATE;  Surgeon: Carolan Clines, MD;  Location: [facility];  Service: Urology;  Laterality: N/A;  LASER RIGHT LATERAL LOBE OF PROSTATE AND LEFT BLADDER NECK    . INTRAOPERATIVE TRANSESOPHAGEAL ECHOCARDIOGRAM N/A 04/15/2014   Procedure: INTRAOPERATIVE TRANSESOPHAGEAL ECHOCARDIOGRAM;  Surgeon: Rexene Alberts, MD;  Location: [place];  Service: Open Heart Surgery;  Laterality: N/A;  . JOINT REPLACEMENT     both knees  . KNEE SURGERY  05/2010   removing scar tissue R knee (Dr. Wynelle Link)  . LEFT AND RIGHT HEART CATHETERIZATION WITH CORONARY ANGIOGRAM  04/13/2014   Procedure: LEFT AND RIGHT HEART CATHETERIZATION WITH CORONARY ANGIOGRAM;  Surgeon: Sinclair Grooms, MD;  Location: [facility] CATH LAB;  Service: Cardiovascular;;  . TONSILLECTOMY    . TOTAL KNEE ARTHROPLASTY  L 4/06, R 4/07  . TOTAL KNEE REVISION   09/23/2012   Procedure: TOTAL KNEE REVISION;  Surgeon: Gearlean Alf, MD; right side  Location: [facility];  Service: Orthopedics;  Laterality: Right;  . TRANSURETHRAL RESECTION OF PROSTATE  06/2010  . VASECTOMY      Social History   Social History  . Marital status: Married    Spouse name: N/A  . Number of children: 4  . Years of education: N/A   Occupational History  . CPA    Social History Main Topics  . Smoking status: Former Smoker    Packs/day: 1.50    Years: 30.00    Types: Cigarettes  Quit date: 09/16/1986  . Smokeless tobacco: Never Used     Comment: 35 pack year history, none in 18 years  . Alcohol use 0.6 oz/week    1 Glasses of wine per week     Comment: 1 glass of wine three times a week (previously daily)  . Drug use: No  . Sexual activity: Not Currently   Other Topics Concern  . Not on file   Social History Narrative   Lives with wife.  4 kids, 1 stepchild (1 in [place], 4 in [place]). 11 grandchildren, 1 great grandchild.   No pets. Wife is a diabetic.   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 Coronary artery disease Mother     deceased  . Dementia Mother   . Atrial fibrillation Mother   . Stroke Mother     related to atrial fib  . Coronary artery disease Father     deceased  . Hypertension Father   . Heart disease Father   . Hypertension Paternal Grandmother   . Heart disease Paternal Grandmother   . Hypertension Paternal Grandfather   . Heart disease Paternal Grandfather   . Hypothyroidism Daughter     congenital  . Cancer Paternal Uncle     colon; died in his 43's  . Colon cancer Paternal Uncle   . Diabetes Neg Hx     ROS: no fevers or chills, productive cough, hemoptysis, dysphasia, odynophagia, melena, hematochezia, dysuria, hematuria, rash, seizure activity, orthopnea, PND, claudication. Remaining systems are negative.  Physical Exam: Well-developed obese in no acute distress.    Skin is warm and dry.  HEENT is normal.  Neck is supple.  Chest is clear to auscultation with normal expansion.  Cardiovascular exam is regular rate and rhythm. 2/6 systolic murmur LSB; S2 preserved Abdominal exam nontender or distended. No masses palpated. Extremities show trace edema. neuro grossly intact  A/P  1 coronary artery disease-continue statin. No aspirin given need for anticoagulation. Nuclear study low risk; medical therapy.  2 carotid artery disease-mild disease on most recent carotid Dopplers. Continue statin.  3 hypertension-blood pressure borderline. I've asked him to follow this at home and we will adjust medications as needed.  4 Chronic systolic heart failure-increase Lasix to 60 mg daily.   6 aortic stenosis-will arrange follow-up echocardiogram in the future; mild on exam.  7 paroxysmal atrial fibrillation-patient remains in sinus rhythm on exam. Continue tikosyn, metoprolol and apixaban.   Kirk Ruths, MD

## 2016-11-20 ENCOUNTER — Other Ambulatory Visit: Payer: Self-pay | Admitting: Cardiology

## 2016-11-20 DIAGNOSIS — I4891 Unspecified atrial fibrillation: Secondary | ICD-10-CM

## 2016-11-21 ENCOUNTER — Encounter: Payer: Self-pay | Admitting: Cardiology

## 2016-11-21 ENCOUNTER — Ambulatory Visit (INDEPENDENT_AMBULATORY_CARE_PROVIDER_SITE_OTHER): Payer: 59 | Admitting: Cardiology

## 2016-11-21 VITALS — BP 140/76 | HR 67 | Ht 67.0 in | Wt 283.0 lb

## 2016-11-21 DIAGNOSIS — I4891 Unspecified atrial fibrillation: Secondary | ICD-10-CM | POA: Diagnosis not present

## 2016-11-21 DIAGNOSIS — I2510 Atherosclerotic heart disease of native coronary artery without angina pectoris: Secondary | ICD-10-CM

## 2016-11-21 MED ORDER — APIXABAN 5 MG PO TABS: 5.0000 mg | ORAL_TABLET | Freq: Two times a day (BID) | ORAL | 1 refills | Status: DC

## 2016-11-21 MED ORDER — DOFETILIDE 500 MCG PO CAPS: 500.0000 ug | ORAL_CAPSULE | Freq: Two times a day (BID) | ORAL | 3 refills | Status: DC

## 2016-11-21 NOTE — Patient Instructions (Signed)
Dr Stanford Breed recommends that you schedule a follow-up appointment in 6 months. You will receive a reminder letter in the mail two months in advance. If you don't receive a letter, please call our office to schedule the follow-up appointment.  If you need a refill on your cardiac medications before your next appointment, please call your pharmacy.

## 2016-11-26 ENCOUNTER — Other Ambulatory Visit: Payer: Self-pay | Admitting: Urology

## 2016-11-26 DIAGNOSIS — D751 Secondary polycythemia: Secondary | ICD-10-CM

## 2016-12-03 ENCOUNTER — Encounter (HOSPITAL_COMMUNITY): Payer: Self-pay

## 2016-12-03 ENCOUNTER — Ambulatory Visit (HOSPITAL_COMMUNITY)
Admission: RE | Admit: 2016-12-03 | Discharge: 2016-12-03 | Disposition: A | Discharge status: Home / Self Care | Payer: 59 | Source: Ambulatory Visit | Attending: Urology | Admitting: Urology

## 2016-12-03 DIAGNOSIS — D751 Secondary polycythemia: Secondary | ICD-10-CM | POA: Diagnosis not present

## 2016-12-03 MED ORDER — FUROSEMIDE 40 MG PO TABS
60.0000 mg | ORAL_TABLET | ORAL | Status: AC
  Administered 2016-12-03: 60 mg via ORAL
  Filled 2016-12-03: qty 1

## 2016-12-03 MED ORDER — SODIUM CHLORIDE 0.9 % IV SOLN
INTRAVENOUS | Status: DC
  Administered 2016-12-03: 12:00:00 via INTRAVENOUS

## 2016-12-03 NOTE — Discharge Instructions (Signed)
Therapeutic Phlebotomy Therapeutic phlebotomy is the controlled removal of blood from a person's body for the purpose of treating a medical condition. The procedure is similar to donating blood. Usually, about a pint (470 mL, or 0.47L) of blood is removed. The average adult has 9-12 pints (4.3-5.7 L) of blood. Therapeutic phlebotomy may be used to treat the following medical conditions:  Hemochromatosis. This is a condition in which the blood contains too much iron.  Polycythemia vera. This is a condition in which the blood contains too many red blood cells.  Porphyria cutanea tarda. This is a disease in which an important part of hemoglobin is not made properly. It results in the buildup of abnormal amounts of porphyrins in the body.  Sickle cell disease. This is a condition in which the red blood cells form an abnormal crescent shape rather than a round shape. Tell a health care provider about:  Any allergies you have.  All medicines you are taking, including vitamins, herbs, eye drops, creams, and over-the-counter medicines.  Any problems you or family members have had with anesthetic medicines.  Any blood disorders you have.  Any surgeries you have had.  Any medical conditions you have. What are the risks? Generally, this is a safe procedure. However, problems may occur, including:  Nausea or light-headedness.  Low blood pressure.  Soreness, bleeding, swelling, or bruising at the needle insertion site.  Infection. What happens before the procedure?  Follow instructions from your health care provider about eating or drinking restrictions.  Ask your health care provider about changing or stopping your regular medicines. This is especially important if you are taking diabetes medicines or blood thinners.  Wear clothing with sleeves that can be raised above the elbow.  Plan to have someone take you home after the procedure.  You may have a blood sample taken. What happens  during the procedure?  A needle will be inserted into one of your veins.  Tubing and a collection bag will be attached to that needle.  Blood will flow through the needle and tubing into the collection bag.  You may be asked to open and close your hand slowly and continually during the entire collection.  After the specified amount of blood has been removed from your body, the collection bag and tubing will be clamped.  The needle will be removed from your vein.  Pressure will be held on the site of the needle insertion to stop the bleeding.  A bandage (dressing) will be placed over the needle insertion site. The procedure may vary among health care providers and hospitals. What happens after the procedure?  Your recovery will be assessed and monitored.  You can return to your normal activities as directed by your health care provider. This information is not intended to replace advice given to you by your health care provider. Make sure you discuss any questions you have with your health care provider. Document Released: 02/04/2011 Document Revised: 05/04/2016 Document Reviewed: 08/29/2014 Elsevier Interactive Patient Education  2017 Elsevier Inc.  

## 2016-12-03 NOTE — Progress Notes (Signed)
Spoke to [employer] at Dr. Arlyn Leak. Order given to give 500 NS bolus and attempt phlebotomy again. If does not work after that then send patient home and reschedule in a week or two.

## 2016-12-03 NOTE — Progress Notes (Signed)
Spoke to [employer] at Dr. Arlyn Leak as patient didn't take his 60 mg po lasix this am. With his 500 cc NS bolus now going (250cc/hr over two hours) he is to take this po lasix now due to his cardiac history. Patient feels fine. Will continue to monitor and attempt again the therapeutic phlebotomy after bolus complete.

## 2016-12-03 NOTE — Progress Notes (Signed)
Jeffrey Wiggins presents today for phlebotomy per MD orders.  Phlebotomy procedure started at 1420 and ended at 1442 . 500 cc removed. Patient tolerated procedure well. IV needle removed intact.  Pt received a 564ml NS Bolus before starting phlebotomy.  Completed bolus around 1345.  #18 to left forearm flushed and attempted blood return.  Could not get blood return.  No swelling noted, iv patent.  But due to no blood return iv site d/c and pressure dressing applied.  Asked pt what he would like to do.  MD had stated that after bolus if we couldn't get blood to reschedule pt for 1 week.  Asked pt if he would let me try a different vein.  Pt stated he really would love to get this phlebotomy done today, so he was willing and agreeable to whatever.  # 18 started to upper left forearm, above previous site and received excellent blood return, flushed well, hooked up to bag and 585ml blood drawn out in 22 minutes time.  VSS, afebrile post procedure, IV site d/c and pressure dressing applied.  Pt voided, up and about in room, no lightheadedness or dizziness noted.  Pt ambulatory to lobby with wife.  Thanked pt for his patience and flexibility today.  Pt smiled and said no problem.

## 2016-12-03 NOTE — Progress Notes (Signed)
Patient here for therapeutic phlebotomy. IV started in left arm 18g good blood return, flushes well. Hooked up to tubing for therapeutic phlebotomy and blood draining very slowly. Filled tubing and only about 5cc in bag. Unhooked and flushed again, used two tourniquets, no improvement in blood flow. IV patent but blood very thick and not draining. Used syringe and pulled out 10cc then unable to pull out more. Started another IV in right arm and this time only blood filled iv tubing - did not even make to iv bag. Again, NS flushes done and double tourniquets used. Three RNs to assist and attempt to get IV sites working well for therapeutic phlebotomy.   RN called the infusion center at the [facility] for suggestions. They stated this happens occasionally for first time therapeutic phlebotomies. They have had orders for patients to receive fluid boluses prior to phlebotomy when history of slow/no blood flowing. Also, encourage patient to drink plenty of water few days prior to appointment.   Call placed to Dr. Arlyn Leak office to make aware of above. Patient currently resting in room comfortably with wife. Having a snack and has both IV sites capped off and have been flushed with Normal Saline.  Spoke to [employer] at Dr. Arlyn Leak office. She will discuss with him and call me back.

## 2016-12-25 NOTE — Telephone Encounter (Signed)
Opened in error

## 2017-03-06 DIAGNOSIS — E291 Testicular hypofunction: Secondary | ICD-10-CM | POA: Diagnosis not present

## 2017-03-11 DIAGNOSIS — R350 Frequency of micturition: Secondary | ICD-10-CM | POA: Diagnosis not present

## 2017-03-11 DIAGNOSIS — N401 Enlarged prostate with lower urinary tract symptoms: Secondary | ICD-10-CM | POA: Diagnosis not present

## 2017-03-11 DIAGNOSIS — E291 Testicular hypofunction: Secondary | ICD-10-CM | POA: Diagnosis not present

## 2017-04-03 ENCOUNTER — Telehealth (HOSPITAL_COMMUNITY): Payer: Self-pay | Admitting: Cardiology

## 2017-04-03 NOTE — Telephone Encounter (Signed)
Spoke with pt, PA for dofetilide approved. #[id number]

## 2017-04-03 NOTE — Telephone Encounter (Signed)
New message   Patient wants a call back today .  Pt C/O medication issue:  1. Name of Medication: dofetilide (TIKOSYN) 500 MCG capsule  2. How are you currently taking this medication (dosage and times per day)? Twice a day - for about a year now   3. Are you having a reaction (difficulty breathing--STAT)?no   4. What is your medication issue? Insurance company saying Dr. Stanford Breed have to  justify why he prescribe this medication.  Patient will be out of medication on tomorrow - Vacation begins tomorrow - [facility] contacted the office x 2 days ago - they can not filled the prescription

## 2017-07-04 NOTE — Progress Notes (Signed)
HPI: FU coronary artery disease, PAF,hypertension, and hyperlipidemia. Cath 7/15 with severe CAD and EF 50. He underwent bypass grafting x2 with LIMA to the LAD and vein graft to the first obtuse marginal branch of the left circumflex. Abd ultrasound 9/15 showed no aneurysm. Also with h/o aflutter, PAF and CVA. Echocardiogram March 2017 showed ejection fraction 45-50%, mild left atrial enlargement, mild right ventricular enlargement and mild right atrial enlargement. Aortic valve abnormal but not dopplered. Nuclear study 1/18 showed EF 38, inferior MI and mild peri-infarct ischemia. Carotid dopplers 1/18 showed 1-39 bilateral stenosis. Since last seen, patient does have dyspnea on exertion but no orthopnea or PND. Minimal pedal edema. No chest pain, palpitations, syncope or bleeding.   Current Outpatient Prescriptions  Medication Sig Dispense Refill  . acetaminophen (TYLENOL) 500 MG tablet Take 1,000 mg by mouth at bedtime as needed for mild pain.     Marland Kitchen amoxicillin (AMOXIL) 500 MG capsule TAKE 4 CAPSULES BY MOUTH 1 HOUR PRE DENTAL APPOINTMENT  1  . apixaban (ELIQUIS) 5 MG TABS tablet Take 1 tablet (5 mg total) by mouth 2 (two) times daily. 180 tablet 1  . atorvastatin (LIPITOR) 80 MG tablet Take 1 tablet (80 mg total) by mouth every evening. 90 tablet 3  . CALCIUM-MAGNESIUM-ZINC PO Take 1 tablet by mouth at bedtime.     . Cholecalciferol (VITAMIN D) 2000 UNITS tablet Take 4,000 Units by mouth at bedtime.     . Coenzyme Q10 (CO Q-10) 100 MG CAPS Take 100 mg by mouth at bedtime.     . Diclofenac Sodium 3 % GEL APPLY UP TO TWICE DAILY AS DIRECTED  3  . dofetilide (TIKOSYN) 500 MCG capsule Take 1 capsule (500 mcg total) by mouth 2 (two) times daily. 180 capsule 3  . fluticasone (FLONASE) 50 MCG/ACT nasal spray USE 2 SPRAYS NASALLY DAILY 48 g 1  . furosemide (LASIX) 40 MG tablet Take 1.5 tablets (60 mg total) by mouth daily. 135 tablet 3  . loratadine (CLARITIN) 10 MG tablet Take 10 mg by mouth  at bedtime.     . metoprolol tartrate (LOPRESSOR) 25 MG tablet Take 1 tablet (25 mg total) by mouth 2 (two) times daily. 180 tablet 3  . Multiple Vitamin (MULTIVITAMIN WITH MINERALS) TABS tablet Take 1 tablet by mouth daily. Centrum silver    . nitroGLYCERIN (NITROSTAT) 0.4 MG SL tablet Place 1 tablet (0.4 mg total) under the tongue every 5 (five) minutes as needed for chest pain. 25 tablet 12  . omeprazole (PRILOSEC) 20 MG capsule Take 20 mg by mouth every other day.     . testosterone cypionate (DEPOTESTOTERONE CYPIONATE) 200 MG/ML injection Inject 200 mg into the muscle every 14 (fourteen) days.     Marland Kitchen losartan (COZAAR) 50 MG tablet Take 1 tablet (50 mg total) by mouth daily. 90 tablet 3   No current facility-administered medications for this visit.      Past Medical History:  Diagnosis Date  . Adenomatous colon polyp 01/2015   Dr. Penelope Coop  . Allergy    RHINITIS  . Arthritis   . Atrial fibrillation ([place])   . Benign prostatic hypertrophy    followed by Dr. Gaynelle Arabian; regrowth R lat lob (11/2015)  . CAD (coronary artery disease)   . Cataracts, bilateral   . Cerebrovascular disease   . Diverticular disease   . Diverticulosis    seen on colonoscopy (9/05, 06/2009)  . Erectile dysfunction   . Family history of adverse reaction  to anesthesia    mother had memeory problems after anesthesia with hip replacement, lasted until she died 2 to 3 years later  . Gallstones   . GERD (gastroesophageal reflux disease)   . 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 Hepatitis age 10   "called yellow jaundice"  . HTN (hypertension)   . Hyperlipidemia   . Hyperplastic colon polyp 05/2004   tubular adenoma 01/2015  . Hypogonadism male    treated by Dr. Gaynelle Arabian  . Myocardial infarction [facility]) '98, '09   (Dr. Stanford Breed); s/p stents  . Nocturia   . Obesity   . Peripheral vascular disease ([place])    02/28/12 MOST RECENT CAROTID DUPLEX--"STABLE MILD CALCIFIED PLAQUE, BILATERALY, STABLE, OVER  SERIAL EXAMS"  . Pneumonia age 66   hx of  . Prostatitis   . 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 Sepsis due to Escherichia coli [facility]) ? 2011   DUE TO URINARY OUTLET OBSTRUCTION /INFECTION  . Shortness of breath dyspnea    with exertion only  . Sleep apnea    mild, CPAP wasn't indicated (per pt)--2014 mod-severe, on CPAP since 07/2013  . Stroke [facility]) jan 2016  . UTI (urinary tract infection)    secondary  . Visit for monitoring Tikosyn therapy     Past Surgical History:  Procedure Laterality Date  . CORONARY ANGIOPLASTY WITH STENT PLACEMENT  01/1997, stent x 1, 2009 2nd stent in same location   PTCA/stent to RCA  AND 2009 2ND STENT PLACED  . CORONARY ARTERY BYPASS GRAFT N/A 04/15/2014   Procedure: CORONARY ARTERY BYPASS GRAFTING TIMES TWO USEING LEFT INTERNAL MAMMARY ARTERY AND RIGHT GREATER SAPHENOUS VEIN VIA ENDOVEIN HARVEST;  Surgeon: Rexene Alberts, MD;  Location: [place];  Service: Open Heart Surgery;  Laterality: N/A;  . CYSTOSCOPY  11/17/15   Tannenbaum  . GREEN LIGHT LASER TURP (TRANSURETHRAL RESECTION OF PROSTATE N/A 01/15/2016   Procedure: GREEN LIGHT LASER TURP (TRANSURETHRAL RESECTION OF PROSTATE;  Surgeon: Carolan Clines, MD;  Location: [facility];  Service: Urology;  Laterality: N/A;  LASER RIGHT LATERAL LOBE OF PROSTATE AND LEFT BLADDER NECK    . INTRAOPERATIVE TRANSESOPHAGEAL ECHOCARDIOGRAM N/A 04/15/2014   Procedure: INTRAOPERATIVE TRANSESOPHAGEAL ECHOCARDIOGRAM;  Surgeon: Rexene Alberts, MD;  Location: [place];  Service: Open Heart Surgery;  Laterality: N/A;  . JOINT REPLACEMENT     both knees  . KNEE SURGERY  05/2010   removing scar tissue R knee (Dr. Wynelle Link)  . LEFT AND RIGHT HEART CATHETERIZATION WITH CORONARY ANGIOGRAM  04/13/2014   Procedure: LEFT AND RIGHT HEART CATHETERIZATION WITH CORONARY ANGIOGRAM;  Surgeon: Sinclair Grooms, MD;  Location: [facility] CATH [facility];  Service: Cardiovascular;;  . TONSILLECTOMY    .  TOTAL KNEE ARTHROPLASTY  L 4/06, R 4/07  . TOTAL KNEE REVISION  09/23/2012   Procedure: TOTAL KNEE REVISION;  Surgeon: Gearlean Alf, MD; right side  Location: [facility];  Service: Orthopedics;  Laterality: Right;  . TRANSURETHRAL RESECTION OF PROSTATE  06/2010  . VASECTOMY      Social History   Social History  . Marital status: Married    Spouse name: N/A  . Number of children: 4  . Years of education: N/A   Occupational History  . CPA    Social History Main Topics  . Smoking status: Former Smoker    Packs/day: 1.50  Years: 30.00    Types: Cigarettes    Quit date: 09/16/1986  . Smokeless tobacco: Never Used     Comment: 35 pack year history, none in 18 years  . Alcohol use 0.6 oz/week    1 Glasses of wine per week     Comment: 1 glass of wine three times a week (previously daily)  . Drug use: No  . Sexual activity: Not Currently   Other Topics Concern  . Not on file   Social History Narrative   Lives with wife.  4 kids, 1 stepchild (1 in [place], 4 in [place]). 11 grandchildren, 1 great grandchild.   No pets. Wife is a diabetic.   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 Coronary artery disease Mother        deceased  . Dementia Mother   . Atrial fibrillation Mother   . Stroke Mother        related to atrial fib  . Coronary artery disease Father        deceased  . Hypertension Father   . Heart disease Father   . Hypertension Paternal Grandmother   . Heart disease Paternal Grandmother   . Hypertension Paternal Grandfather   . Heart disease Paternal Grandfather   . Hypothyroidism Daughter        congenital  . Cancer Paternal Uncle        colon; died in his 51's  . Colon cancer Paternal Uncle   . Diabetes Neg Hx     ROS: Right hip pain but no fevers or chills, productive cough, hemoptysis, dysphasia, odynophagia, melena, hematochezia, dysuria, hematuria, rash, seizure activity, orthopnea, PND, claudication.  Remaining systems are negative.  Physical Exam: Well-developed obese in no acute distress.  Skin is warm and dry.  HEENT is normal.  Neck is supple.  Chest is clear to auscultation with normal expansion.  Cardiovascular exam is regular rate and rhythm. 2/6 systolic murmur Abdominal exam nontender or distended. No masses palpated. Extremities show trace edema. neuro grossly intact  ECG- sinus rhythm at a rate of 69. Normal axis. Nonspecific ST changes. QT is not prolonged. personally reviewed  A/P  1 Coronary artery disease-patient doing well from a symptomatic standpoint. No chest pain. Continue statin. He is not on aspirin given need for anticoagulation.  2 paroxysmal atrial fibrillation-patient in sinus rhythm today. Continue tikosyn, metoprolol and apixaban. Check hemoglobin, renal function and magnesium.  3 hypertension-blood pressure is controlled. Continue present medications.  4 carotid artery disease-continue statin. Mild on most recent Dopplers.  5 chronic systolic congestive heart failure-patient does note dyspnea on exertion. However he appears to be euvolemic. Continue present dose of Lasix. Check potassium and renal function. Check BNP. If elevated would consider increasing diuretic. Some of his dyspnea may be secondary to history of obstructive sleep apnea, size and COPD.   6 aortic stenosis-we will repeat echocardiogram.   Kirk Ruths, MD

## 2017-07-07 DIAGNOSIS — Z23 Encounter for immunization: Secondary | ICD-10-CM | POA: Diagnosis not present

## 2017-07-10 ENCOUNTER — Ambulatory Visit (INDEPENDENT_AMBULATORY_CARE_PROVIDER_SITE_OTHER): Payer: Medicare Other | Admitting: Cardiology

## 2017-07-10 ENCOUNTER — Encounter: Payer: Self-pay | Admitting: Cardiology

## 2017-07-10 VITALS — BP 130/82 | HR 74 | Ht 67.0 in | Wt 283.0 lb

## 2017-07-10 DIAGNOSIS — I2510 Atherosclerotic heart disease of native coronary artery without angina pectoris: Secondary | ICD-10-CM

## 2017-07-10 DIAGNOSIS — I480 Paroxysmal atrial fibrillation: Secondary | ICD-10-CM | POA: Diagnosis not present

## 2017-07-10 DIAGNOSIS — R0609 Other forms of dyspnea: Secondary | ICD-10-CM

## 2017-07-10 DIAGNOSIS — I350 Nonrheumatic aortic (valve) stenosis: Secondary | ICD-10-CM | POA: Diagnosis not present

## 2017-07-10 DIAGNOSIS — E7800 Pure hypercholesterolemia, unspecified: Secondary | ICD-10-CM | POA: Diagnosis not present

## 2017-07-10 DIAGNOSIS — R0600 Dyspnea, unspecified: Secondary | ICD-10-CM

## 2017-07-10 LAB — BASIC METABOLIC PANEL
BUN / CREAT RATIO: 14 (ref 10–24)
BUN: 14 mg/dL (ref 8–27)
CO2: 28 mmol/L (ref 20–29)
CREATININE: 0.99 mg/dL (ref 0.76–1.27)
Calcium: 9.3 mg/dL (ref 8.6–10.2)
Chloride: 101 mmol/L (ref 96–106)
GFR calc non Af Amer: 74 mL/min/{1.73_m2} (ref >59)
GFR, EST AFRICAN AMERICAN: 86 mL/min/{1.73_m2} (ref >59)
GLUCOSE: 181 mg/dL — AB (ref 65–99)
Potassium: 4.6 mmol/L (ref 3.5–5.2)
SODIUM: 141 mmol/L (ref 134–144)

## 2017-07-10 LAB — CBC
HEMOGLOBIN: 16.3 g/dL (ref 13.0–17.7)
Hematocrit: 49.5 % (ref 37.5–51.0)
MCH: 28.3 pg (ref 26.6–33.0)
MCHC: 32.9 g/dL (ref 31.5–35.7)
MCV: 86 fL (ref 79–97)
PLATELETS: 180 10*3/uL (ref 150–379)
RBC: 5.75 x10E6/uL (ref 4.14–5.80)
RDW: 16.9 % — ABNORMAL HIGH (ref 12.3–15.4)
WBC: 6.6 10*3/uL (ref 3.4–10.8)

## 2017-07-10 LAB — MAGNESIUM: MAGNESIUM: 2 mg/dL (ref 1.6–2.3)

## 2017-07-10 LAB — PRO B NATRIURETIC PEPTIDE: NT-Pro BNP: 466 pg/mL (ref 0–486)

## 2017-07-10 NOTE — Patient Instructions (Signed)

## 2017-07-14 ENCOUNTER — Ambulatory Visit (HOSPITAL_COMMUNITY): Payer: Medicare Other | Attending: Cardiology

## 2017-07-14 ENCOUNTER — Other Ambulatory Visit: Payer: Self-pay

## 2017-07-14 DIAGNOSIS — I10 Essential (primary) hypertension: Secondary | ICD-10-CM | POA: Diagnosis not present

## 2017-07-14 DIAGNOSIS — Z951 Presence of aortocoronary bypass graft: Secondary | ICD-10-CM | POA: Diagnosis not present

## 2017-07-14 DIAGNOSIS — I071 Rheumatic tricuspid insufficiency: Secondary | ICD-10-CM | POA: Diagnosis not present

## 2017-07-14 DIAGNOSIS — G4730 Sleep apnea, unspecified: Secondary | ICD-10-CM | POA: Diagnosis not present

## 2017-07-14 DIAGNOSIS — H35372 Puckering of macula, left eye: Secondary | ICD-10-CM | POA: Diagnosis not present

## 2017-07-14 DIAGNOSIS — I350 Nonrheumatic aortic (valve) stenosis: Secondary | ICD-10-CM

## 2017-07-14 DIAGNOSIS — H35361 Drusen (degenerative) of macula, right eye: Secondary | ICD-10-CM | POA: Diagnosis not present

## 2017-07-14 DIAGNOSIS — I4891 Unspecified atrial fibrillation: Secondary | ICD-10-CM | POA: Insufficient documentation

## 2017-07-14 DIAGNOSIS — E785 Hyperlipidemia, unspecified: Secondary | ICD-10-CM | POA: Insufficient documentation

## 2017-07-14 DIAGNOSIS — H25013 Cortical age-related cataract, bilateral: Secondary | ICD-10-CM | POA: Diagnosis not present

## 2017-07-14 DIAGNOSIS — H2513 Age-related nuclear cataract, bilateral: Secondary | ICD-10-CM | POA: Diagnosis not present

## 2017-07-18 ENCOUNTER — Encounter: Payer: Self-pay | Admitting: Family Medicine

## 2017-07-21 ENCOUNTER — Encounter: Payer: Self-pay | Admitting: *Deleted

## 2017-08-28 DIAGNOSIS — E291 Testicular hypofunction: Secondary | ICD-10-CM | POA: Diagnosis not present

## 2017-09-01 ENCOUNTER — Other Ambulatory Visit: Payer: Self-pay | Admitting: Cardiology

## 2017-09-04 DIAGNOSIS — R351 Nocturia: Secondary | ICD-10-CM | POA: Diagnosis not present

## 2017-09-04 DIAGNOSIS — N401 Enlarged prostate with lower urinary tract symptoms: Secondary | ICD-10-CM | POA: Diagnosis not present

## 2017-09-04 DIAGNOSIS — N5201 Erectile dysfunction due to arterial insufficiency: Secondary | ICD-10-CM | POA: Diagnosis not present

## 2017-09-04 DIAGNOSIS — E291 Testicular hypofunction: Secondary | ICD-10-CM | POA: Diagnosis not present

## 2017-09-12 DIAGNOSIS — N5201 Erectile dysfunction due to arterial insufficiency: Secondary | ICD-10-CM | POA: Diagnosis not present

## 2017-09-27 ENCOUNTER — Other Ambulatory Visit: Payer: Self-pay | Admitting: Cardiology

## 2017-11-16 ENCOUNTER — Other Ambulatory Visit: Payer: Self-pay | Admitting: Cardiology

## 2017-11-16 DIAGNOSIS — I4891 Unspecified atrial fibrillation: Secondary | ICD-10-CM

## 2017-11-24 ENCOUNTER — Other Ambulatory Visit: Payer: Self-pay | Admitting: Cardiology

## 2017-12-23 NOTE — Progress Notes (Signed)
Chief Complaint  Patient presents with  . Medicare Wellness    fasting (blood in lab) AWV.  Ankles have been swollen for a few month. Is coughing up white phlegm x 1 year. Has been having SOB for about a year as well or longer.     Jeffrey ZINGARO is a 77 y.o. male who presents for annual wellness visit and follow-up on chronic medical conditions. He hasn't been seen in over a year due to an insurance issue.  He has the following concerns:  He reports ankle swelling, SOB (chronic, not significantly different), coughing up white phlegm as mentioned in CC.  These have been mentioned to his cardiologist, BNP was normal 6 mos ago. He has lost 6# since that visit.  Spirometry in 09/2016 showed moderately severe restriction. Has had an annoying cough for about a year.  He takes zyrtec and flonase. He denies sneezing and runny nose, but does report postnasal drainage. Takes prilosec daily and denies heartburn.  Diabetes. Diabetes was diagnosed 05/2013 based on A1c of 6.7. He has been controlled just with diet (only took metformin for a short time). He hasn't checked his sugars (wife has diabetes, checks regularly, but he doesn't check his). He has been following a diabetic diet (he limits carbs and sugar in his diet). He goes to the fitness center, getting exercise 3-5x/week.  No polydipsia, polyuria, numbness, tingling, vision or feet concerns. Last eye exam was 06/2017 (has retinopathy). Lab Results  Component Value Date   HGBA1C 5.9 09/19/2016   H/o Iron deficiency, mild anemia: He is on chronic anticoagulation, denies any bleeding. He no longer takes iron (stopped 09/2016). In 09/2016 Hg was elevated at 18.3. He is on testosterone replacement, and had therapeutic phlebotomy in 32018.  Labs are monitored by urologist, who treats with testosterone.  Last CBC was normal. Lab Results  Component Value Date   WBC 6.6 07/10/2017   HGB 16.3 07/10/2017   HCT 49.5 07/10/2017   MCV 86 07/10/2017   PLT 180  07/10/2017   Lab Results  Component Value Date   IRON 33 (L) 08/24/2015   FERRITIN 22 09/19/2016   Hyperlipidemia: He reports compliance with taking 22m lipitor, denies side effects.  Is reportedly following a lowfat, low cholesterol diet.  (he previously completed study drug in the Fall 2016 though he believes it was likely placebo, because HDL didn't improve like it was supposed to).  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 He remains on Eliquis for stroke prevention for atrial fibrillation. He no longer sees Dr. SLeonie Man  Goals recommended by neuro: BP < 130/90, hemoglobin A1c goal below 6.5% and lipids with LDL cholesterol goal below 70 mg/dL.  Afib, HTN--He doesn't check his blood pressure elsewhere, just at doctor visits. He has a monitor, but hasn't been using. Denies headaches, dizziness, denies any chest pain, palpitations. Since being on Tikosyn, no longer noticing fib/flutter on machines at the gym (never has symptoms, only sees on machine pulse monitoring). He remains on Eliquis without any bleeding complications. He sees Dr. CStanford Breedregularly (see below).    CAD: S/p CABG; stable. Not on aspirin due to being on eliquis. Nuclear study 1/18 showed EF 38, inferior MI and mild peri-infarct ischemia. Carotid dopplers 1/18 showed 1-39 bilateral stenosis. Last echocardiogram was 06/2017  which showed: Low normal to mildly reduced LV systolic function; moderate diastolic dysfunction; mild LVH; calcified aortic valve with mild AS (mean gradient 10 mmHg) and trace AI; mild TR with moderate pulmonary hypertension.  He continues to have some breathlessness with exertion, unchanged from when last discussed with his cardiologist and studies. No SOB at rest. Finds himself breathing hard just walking through the house. Still  able to do 15 mins on the bike, and no chest pain. Cardiologist feels that some of his dyspnea may be secondary to history of obstructive sleep apnea, size and COPD.   Diastolic heart failure--on lasix per cardiologist.  He takes lasix 1.5 tablets daily. He weighs himself daily, takes 2 tablets if weight is up over 2# (weighs daily). He was taken off the losartan and spironolactone due to high potassium levels in the past, but is back on the losartan per current records.  Carotid artery stenosis--last [place] was 09/2016, and showed 1-39% blockage, no f/u needed per Dr. Stanford Breed. (in 04/2015, The right is 40-59% and the left is 1-39%)  OSA: compliant with CPAP use nightly.  GERD--controlled with meds. Compliant with taking prilosec daily, and denies heartburn. Denies dysphagia. Has a cough that gets up clear mucus a few times/day--chronic and unchanged. He does have allergies, which are controlled with claritin and flonase.  Hypogonadism and BPH: under the care of urologist (previously Dr. Gaynelle Arabian, who retired).  He continues on testosterone injections, weekly. After is last visit/labs, dose was changed from 27m qowk to 1/2 mL weekly. He is s/p TURP.  Denies any urinary retention. Up once a night to void. He continues to have ED.  He was given medication for injection from urologist.  Obesity: exercise regularly, eats small portions, healthy foods. 1 glass of wine 3-4/week. Unsweet tea when he goes out to eat.  Mostly drinks water.   Immunization History  Administered Date(s) Administered  . Influenza Split 07/07/2012, 06/18/2013, 07/18/2015  . Influenza, High Dose Seasonal PF 06/14/2014, 06/16/2016  . Pneumococcal Conjugate-13 08/18/2014  . Pneumococcal Polysaccharide-23 08/20/2007  . Td 08/16/2002  . Tdap 03/16/2012  . Zoster 12/04/2012  had flu shot from pharmacy Last colonoscopy: 01/2015 with Dr. GPenelope Coop +tubular adenoma Last PSA: per Dr. TArlyn Leakoffice, and has appt  scheduled. Dentist: twice yearly  Ophtho: yearly, UTD Exercise: 3-5 times/week, 15 minutes on recumbent bike, then strength training   Other doctors caring for patient include: Dr. CAllena EaringDr. HRogers SeedsDr. TGaynelle Arabian(retired)--urology; saw replacement, cannot recall name Dr. GPenelope Coop GI  Dr. [facility] [address]--Dentist [facility] audiology.  Depression screen:  negative Fall screen: none in the last year Functional status survey: notable for buzz in left ear recently; has hearing aids, double vision after reading (short-term), slight memory issues Mini-Cog screen: normal (5)  End of Life Discussion:  Patient has a living will and medical power of attorney. He is Full Code, Full Care  Past Medical History:  Diagnosis Date  . Adenomatous colon polyp 01/2015   Dr. GPenelope Coop . Allergy    RHINITIS  . Arthritis   . Atrial fibrillation ([place]   . Benign prostatic hypertrophy    followed by Dr. TGaynelle Arabian regrowth R lat lob (11/2015)  . CAD (coronary artery disease)   . Cataracts, bilateral   . Cerebrovascular disease   . Diverticular disease   . Diverticulosis    seen on colonoscopy (9/05, 06/2009)  . Erectile dysfunction   . Family history of adverse reaction to anesthesia  mother had memeory problems after anesthesia with hip replacement, lasted until she died 2 to 3 years later  . Gallstones   . GERD (gastroesophageal reflux disease)   . 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 Hepatitis age 21   "called yellow jaundice"  . HTN (hypertension)   . Hyperlipidemia   . Hyperplastic colon polyp 05/2004   tubular adenoma 01/2015  . Hypogonadism male    treated by Dr. Gaynelle Arabian  . Myocardial infarction [facility]) '98, '09   (Dr. Stanford Breed); s/p stents  . Nocturia   . Obesity   . Peripheral vascular disease ([place])    02/28/12 MOST RECENT CAROTID DUPLEX--"STABLE MILD CALCIFIED PLAQUE, BILATERALY, STABLE, OVER SERIAL EXAMS"  . Pneumonia age 15    hx of  . Prostatitis   . 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 Sepsis due to Escherichia coli [facility]) ? 2011   DUE TO URINARY OUTLET OBSTRUCTION /INFECTION  . Shortness of breath dyspnea    with exertion only  . Sleep apnea    mild, CPAP wasn't indicated (per pt)--2014 mod-severe, on CPAP since 07/2013  . Stroke [facility]) jan 2016  . UTI (urinary tract infection)    secondary  . Visit for monitoring Tikosyn therapy     Past Surgical History:  Procedure Laterality Date  . CORONARY ANGIOPLASTY WITH STENT PLACEMENT  01/1997, stent x 1, 2009 2nd stent in same location   PTCA/stent to RCA  AND 2009 2ND STENT PLACED  . CORONARY ARTERY BYPASS GRAFT N/A 04/15/2014   Procedure: CORONARY ARTERY BYPASS GRAFTING TIMES TWO USEING LEFT INTERNAL MAMMARY ARTERY AND RIGHT GREATER SAPHENOUS VEIN VIA ENDOVEIN HARVEST;  Surgeon: Rexene Alberts, MD;  Location: [facility];  Service: Open Heart Surgery;  Laterality: N/A;  . CYSTOSCOPY  11/17/15   [facility]  . GREEN LIGHT LASER TURP (TRANSURETHRAL RESECTION OF PROSTATE N/A 01/15/2016   Procedure: GREEN LIGHT LASER TURP (TRANSURETHRAL RESECTION OF PROSTATE;  Surgeon: Carolan Clines, MD;  Location: [facility];  Service: Urology;  Laterality: N/A;  LASER RIGHT LATERAL LOBE OF PROSTATE AND LEFT BLADDER NECK    . INTRAOPERATIVE TRANSESOPHAGEAL ECHOCARDIOGRAM N/A 04/15/2014   Procedure: INTRAOPERATIVE TRANSESOPHAGEAL ECHOCARDIOGRAM;  Surgeon: Rexene Alberts, MD;  Location: [place];  Service: Open Heart Surgery;  Laterality: N/A;  . JOINT REPLACEMENT     both knees  . KNEE SURGERY  05/2010   removing scar tissue R knee (Dr. Wynelle Link)  . LEFT AND RIGHT HEART CATHETERIZATION WITH CORONARY ANGIOGRAM  04/13/2014   Procedure: LEFT AND RIGHT HEART CATHETERIZATION WITH CORONARY ANGIOGRAM;  Surgeon: Sinclair Grooms, MD;  [facility]: [facility] CATH LAB;  Service: Cardiovascular;;  . TONSILLECTOMY    . TOTAL KNEE ARTHROPLASTY  L 4/06, R  4/07  . TOTAL KNEE REVISION  09/23/2012   Procedure: TOTAL KNEE REVISION;  Surgeon: Gearlean Alf, MD; right side  Location: [facility];  Service: Orthopedics;  Laterality: Right;  . TRANSURETHRAL RESECTION OF PROSTATE  06/2010  . VASECTOMY      Social History   Socioeconomic History  . Marital status: Married    Spouse name: Not on file  . Number of children: 4  . Years of education: Not on file  . Highest education level: Not on file  Occupational History  . Occupation: Engineer, maintenance (IT)  Social Needs  . Financial resource strain: Not on file  .  Food insecurity:    Worry: Not on file    Inability: Not on file  . Transportation needs:    Medical: Not on file    Non-medical: Not on file  Tobacco Use  . Smoking status: Former Smoker    Packs/day: 1.50    Years: 30.00    Pack years: 45.00    Types: Cigarettes    Last attempt to quit: 09/16/1986    Years since quitting: 31.2  . Smokeless tobacco: Never Used  . Tobacco comment: smoked 1-3 PPD from age 82 to 5  Substance and Sexual Activity  . Alcohol use: Yes    Alcohol/week: 0.6 oz    Types: 1 Glasses of wine per week    Comment: 1 glass of wine 3-4 times a week  . Drug use: No  . Sexual activity: Not Currently  Lifestyle  . Physical activity:    Days per week: Not on file    Minutes per session: Not on file  . Stress: Not on file  Relationships  . Social connections:    Talks on phone: Not on file    Gets together: Not on file    Attends religious service: Not on file    Active member of club or organization: Not on file    Attends meetings of clubs or organizations: Not on file    Relationship status: Not on file  . Intimate partner violence:    Fear of current or ex partner: Not on file    Emotionally abused: Not on file    Physically abused: Not on file    Forced sexual activity: Not on file  Other Topics Concern  . Not on file  Social History Narrative   Lives with wife.  4 kids, 1 stepchild (1 in [place], 4 in [place]). 11  grandchildren, 1 great grandchild.   No pets. Wife is a diabetic.   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 Coronary artery disease Mother        deceased  . Dementia Mother   . Atrial fibrillation Mother   . Stroke Mother        related to atrial fib  . Coronary artery disease Father        deceased  . Hypertension Father   . Heart disease Father   . Hypertension Paternal Grandmother   . Heart disease Paternal Grandmother   . Hypertension Paternal Grandfather   . Heart disease Paternal Grandfather   . Hypothyroidism Daughter        congenital  . Cancer Paternal Uncle        colon; died in his 32's  . Colon cancer Paternal Uncle   . Diabetes Neg Hx     Outpatient Encounter Medications as of 12/24/2017  Medication Sig Note  . atorvastatin (LIPITOR) 80 MG tablet TAKE 1 TABLET BY MOUTH EVERY EVENING   . CALCIUM-MAGNESIUM-ZINC PO Take 1 tablet by mouth at bedtime.    . cetirizine (ZYRTEC) 10 MG tablet Take 10 mg by mouth daily.   . Cholecalciferol (VITAMIN D) 2000 UNITS tablet Take 4,000 Units by mouth at bedtime.    . Coenzyme Q10 (CO Q-10) 100 MG CAPS Take 100 mg by mouth at bedtime.    . dofetilide (TIKOSYN) 500 MCG capsule TAKE ONE CAPSULE BY MOUTH TWICE A DAY   . ELIQUIS 5 MG TABS tablet TAKE 1 TABLET BY MOUTH TWICE A DAY   .  fluticasone (FLONASE) 50 MCG/ACT nasal spray USE 2 SPRAYS NASALLY DAILY   . furosemide (LASIX) 40 MG tablet TAKE 1&1/2 TABLET BY MOUTH DAILY   . losartan (COZAAR) 50 MG tablet TAKE 1 TABLET (50 MG TOTAL) BY MOUTH DAILY.   . metoprolol tartrate (LOPRESSOR) 25 MG tablet TAKE 1 TABLET BY MOUTH 2 TIMES A DAY   . Multiple Vitamin (MULTIVITAMIN WITH MINERALS) TABS tablet Take 1 tablet by mouth daily. Centrum silver   . omeprazole (PRILOSEC) 20 MG capsule Take 20 mg by mouth every other day.    . testosterone cypionate (DEPOTESTOTERONE CYPIONATE) 200 MG/ML injection Inject 200 mg into the muscle  every 14 (fourteen) days.  12/24/2017: Now taking 167m every week (1/2 ML qweek) per urologist  . acetaminophen (TYLENOL) 500 MG tablet Take 1,000 mg by mouth at bedtime as needed for mild pain.    .Marland Kitchenamoxicillin (AMOXIL) 500 MG capsule TAKE 4 CAPSULES BY MOUTH 1 HOUR PRE DENTAL APPOINTMENT   . Diclofenac Sodium 3 % GEL APPLY UP TO TWICE DAILY AS DIRECTED 03/14/2016: Uses on his head, per dermatologist  . nitroGLYCERIN (NITROSTAT) 0.4 MG SL tablet Place 1 tablet (0.4 mg total) under the tongue every 5 (five) minutes as needed for chest pain. (Patient not taking: Reported on 12/24/2017)   . [DISCONTINUED] loratadine (CLARITIN) 10 MG tablet Take 10 mg by mouth at bedtime.     No facility-administered encounter medications on file as of 12/24/2017.    Also had some sort of injection for ED  No Known Allergies  ROS: The patient denies anorexia, fever, headaches, ear pain, hoarseness, chest pain, palpitations, syncope,; denies nausea, vomiting, diarrhea, constipation, abdominal pain, melena, hematochezia, hematuria, incontinence, dysuria, genital lesions, numbness, tingling, weakness, tremor, suspicious skin lesions, depression, anxiety, abnormal bleeding/bruising, or enlarged lymph nodes.  +allergies/PND, cough, DOE per HPI Swelling in feet and ankles, some days more than others. Tries to keep legs elevated.  Doesn't like compression socks. +hearing loss, wears hearing aids, some recent buzzing in the left ear. Double vision after reading for a long time, short-lived Some bilateral knee pain/stiffness, unchanged.  No giving way or swelling. +ED   PHYSICAL EXAM:  BP 140/88   Pulse 68   Temp 98.8 F (37.1 C) (Tympanic)   Ht 5' 8"  (1.727 m)   Wt 277 lb 3.2 oz (125.7 kg)   SpO2 98%   BMI 42.15 kg/m   Wt Readings from Last 3 Encounters:  12/24/17 277 lb 3.2 oz (125.7 kg)  07/10/17 283 lb (128.4 kg)  12/03/16 283 lb (128.4 kg)    General Appearance:  Alert, cooperative, no distress,  appears stated age, obese   Head:  Normocephalic, without obvious abnormality, atraumatic   Eyes:  PERRL, conjunctiva/corneas clear, EOM's intact, fundi benign   Ears:  +hearing aids bilaterally. Normal TM's, and nonocclusive cerumen bilaterally  Nose:  Nares normal, mucosa mildly edematous, no erythema or purulence, no drainage or sinus tenderness   Throat:  Lips, mucosa, and tongue normal; teeth and gums normal   Neck:  Supple, no lymphadenopathy; thyroid: no enlargement/tenderness/nodules; no carotid bruit or JVD   Back:  Spine nontender, no curvature, ROM normal, no CVA tenderness   Lungs:  Clear to auscultation bilaterally without wheezes, rales or ronchi; respirations unlabored   Chest Wall:  No tenderness or deformity   Heart:  Regular rate and rhythm. 2/6 SEM heard best at RUSB, no rub or gallop.   Breast Exam:  No chest wall tenderness, or masses  Abdomen:  Soft, obese, non-tender, nondistended, normoactive bowel sounds, no masses, no hepatosplenomegaly.  +diastasis recti, nontender  Genitalia:  Testicles descended bilaterally without masses. No hernias, no lesions  Rectal:  Deferred to urologist.   Extremities:  No clubbing or cyanosis, 2+ pretibial edema bilaterally  Pulses:  2+ and symmetric all extremities   Skin:  Skin turgor normal, no rashes or lesions; hyperpigmentation present at BLE, consistent with venous stasis changes. Trace to 1+ edema  Lymph nodes:  Cervical, supraclavicular, and axillary nodes normal   Neurologic:  CNII-XII intact, normal strength, sensation and gait; reflexes 2+ and symmetric throughout            Psych:    Normal mood, affect, hygiene and grooming  Diabetic foot exam normal  Lab Results  Component Value Date   HGBA1C 6.1 12/24/2017     ASSESSMENT/PLAN:  DM (diabetes mellitus), type 2 with peripheral vascular complications ([place]) - Diet controlled. A1c sl higher--discussed  diet, exercise, need for wt loss - Plan: HgB A1c, TSH  Type 2 diabetes mellitus with retinopathy, without long-term current use of insulin, macular edema presence unspecified, unspecified laterality, unspecified retinopathy severity ([place]) - Plan: HgB A1c  Essential hypertension - BP elevated today; encouraged to monitor regularly, low sodium diet, regular exercise, weight loss  Pure hypercholesterolemia - Plan: Lipid panel  Obstructive sleep apnea - compliant with CPAP  Gastroesophageal reflux disease, esophagitis presence not specified - continue PPI (risks/supplements reviewed)  Coronary artery disease involving native coronary artery of native heart without angina pectoris - stable, asymptomatic; under the care of Dr. Stanford Breed  Medication monitoring encounter - Plan: CBC with Differential/Platelet, Comprehensive metabolic panel, Lipid panel  Paroxysmal atrial fibrillation ([place])  Long term current use of anticoagulant - Plan: CBC with Differential/Platelet  History of embolic stroke  Stenosis of carotid artery, unspecified laterality - mild, asymptomatic  Dyspnea on exertion - Plan: DG Chest 2 View  Chronic cough - Plan: DG Chest 2 View  Immunization due - Plan: Pneumococcal polysaccharide vaccine 23-valent greater than or equal to 2yo subcutaneous/IM   A1c c-met, lipids, CBC, TSH  CXR f/b pulmonary referral  Forward labs to [facility].  PSA screening and prostate exams are done by his urologist; recommended at least 30 minutes of aerobic activity at least 5 days/week, weight-bearing exercise at least 2x/wk; proper sunscreen use reviewed; healthy diet and alcohol recommendations (less than or equal to 2 drinks/day) reviewed; regular seatbelt use; changing batteries in smoke detectors. Self-testicular exams. Immunization recommendations discussed--pneumovax booster given today.Continue high dose flu shots yearly. Shingrix recommended, risks/side effects reviewed, to get from  pharmacy. Colonoscopy recommendations reviewed--UTD  Counseled extensively re: diet, exercise, obesity and need for wt loss.   Medicare Attestation I have personally reviewed: The patient's medical and social history Their use of alcohol, tobacco or illicit drugs Their current medications and supplements The patient's functional ability including ADLs,fall risks, home safety risks, cognitive, and hearing and visual impairment Diet and physical activities Evidence for depression or mood disorders  The patient's weight, height and BMI have been recorded in the chart.  I have made referrals, counseling, and provided education to the patient based on review of the above and I have provided the patient with a written personalized care plan for preventive services.

## 2017-12-24 ENCOUNTER — Ambulatory Visit (INDEPENDENT_AMBULATORY_CARE_PROVIDER_SITE_OTHER): Payer: Medicare HMO | Admitting: Family Medicine

## 2017-12-24 ENCOUNTER — Encounter: Payer: Self-pay | Admitting: Family Medicine

## 2017-12-24 VITALS — BP 140/88 | HR 68 | Temp 98.8°F | Ht 68.0 in | Wt 277.2 lb

## 2017-12-24 DIAGNOSIS — Z0000 Encounter for general adult medical examination without abnormal findings: Secondary | ICD-10-CM | POA: Diagnosis not present

## 2017-12-24 DIAGNOSIS — E11319 Type 2 diabetes mellitus with unspecified diabetic retinopathy without macular edema: Secondary | ICD-10-CM | POA: Diagnosis not present

## 2017-12-24 DIAGNOSIS — Z23 Encounter for immunization: Secondary | ICD-10-CM | POA: Diagnosis not present

## 2017-12-24 DIAGNOSIS — Z5181 Encounter for therapeutic drug level monitoring: Secondary | ICD-10-CM

## 2017-12-24 DIAGNOSIS — I6529 Occlusion and stenosis of unspecified carotid artery: Secondary | ICD-10-CM

## 2017-12-24 DIAGNOSIS — E1151 Type 2 diabetes mellitus with diabetic peripheral angiopathy without gangrene: Secondary | ICD-10-CM | POA: Diagnosis not present

## 2017-12-24 DIAGNOSIS — R0600 Dyspnea, unspecified: Secondary | ICD-10-CM

## 2017-12-24 DIAGNOSIS — K219 Gastro-esophageal reflux disease without esophagitis: Secondary | ICD-10-CM

## 2017-12-24 DIAGNOSIS — E7800 Pure hypercholesterolemia, unspecified: Secondary | ICD-10-CM | POA: Diagnosis not present

## 2017-12-24 DIAGNOSIS — Z7901 Long term (current) use of anticoagulants: Secondary | ICD-10-CM | POA: Diagnosis not present

## 2017-12-24 DIAGNOSIS — R0609 Other forms of dyspnea: Secondary | ICD-10-CM

## 2017-12-24 DIAGNOSIS — I2510 Atherosclerotic heart disease of native coronary artery without angina pectoris: Secondary | ICD-10-CM

## 2017-12-24 DIAGNOSIS — R053 Chronic cough: Secondary | ICD-10-CM

## 2017-12-24 DIAGNOSIS — I480 Paroxysmal atrial fibrillation: Secondary | ICD-10-CM

## 2017-12-24 DIAGNOSIS — I10 Essential (primary) hypertension: Secondary | ICD-10-CM | POA: Diagnosis not present

## 2017-12-24 DIAGNOSIS — Z8673 Personal history of transient ischemic attack (TIA), and cerebral infarction without residual deficits: Secondary | ICD-10-CM

## 2017-12-24 DIAGNOSIS — G4733 Obstructive sleep apnea (adult) (pediatric): Secondary | ICD-10-CM

## 2017-12-24 DIAGNOSIS — R05 Cough: Secondary | ICD-10-CM

## 2017-12-24 LAB — POCT GLYCOSYLATED HEMOGLOBIN (HGB A1C): Hemoglobin A1C: 6.1

## 2017-12-24 NOTE — Patient Instructions (Addendum)
  HEALTH MAINTENANCE RECOMMENDATIONS:  It is recommended that you get at least 30 minutes of aerobic exercise at least 5 days/week (for weight loss, you may need as much as 60-90 minutes). This can be any activity that gets your heart rate up. This can be divided in 10-15 minute intervals if needed, but try and build up your endurance at least once a week.  Weight bearing exercise is also recommended twice weekly.  Eat a healthy diet with lots of vegetables, fruits and fiber.  "Colorful" foods have a lot of vitamins (ie green vegetables, tomatoes, red peppers, etc).  Limit sweet tea, regular sodas and alcoholic beverages, all of which has a lot of calories and sugar.  Up to 2 alcoholic drinks daily may be beneficial for men (unless trying to lose weight, watch sugars).  Drink a lot of water.  Sunscreen of at least SPF 30 should be used on all sun-exposed parts of the skin when outside between the hours of 10 am and 4 pm (not just when at beach or pool, but even with exercise, golf, tennis, and yard work!)  Use a sunscreen that says "broad spectrum" so it covers both UVA and UVB rays, and make sure to reapply every 1-2 hours.  Remember to change the batteries in your smoke detectors when changing your clock times in the spring and fall.  Use your seat belt every time you are in a car, and please drive safely and not be distracted with cell phones and texting while driving.   Jeffrey Wiggins , Thank you for taking time to come for your Medicare Wellness Visit. I appreciate your ongoing commitment to your health goals. Please review the following plan we discussed and let me know if I can assist you in the future.   These are the goals we discussed: Goals    None      This is a list of the screening recommended for you and due dates:  Health Maintenance  Topic Date Due  . Complete foot exam   02/22/2016  . Hemoglobin A1C  03/19/2017  . Eye exam for diabetics  07/08/2017  . Flu Shot  04/16/2018    . Colon Cancer Screening  02/01/2020  . Tetanus Vaccine  03/16/2022  . Pneumonia vaccines  Completed   We gave you a pneumovax booster today.  I recommend getting the new shingles vaccine (Shingrix). You will need to check with your insurance to see if it is covered, and if covered by Medicare Part D, you need to get from the pharmacy rather than our office.  It is a series of 2 injections, spaced 2 months apart.  A1c and foot exam was done today. Continue yearly eye exams (we need to call for your last one).  We are sending a copy of your labs to [facility] for their records. Please remind them to send me copies of their reports/visits as well.  Please go to [facility] for chest x-ray at your convenience, as part of evaluation of shortness of breath and chronic cough. Continue to treat your allergies, reflux.  Once we get the x-ray results, we will refer you to the pulmonary doctors for further evaluation.  Continue to work hard on losing weight.  Increase exercise to at least 30 minutes 5 days/week.  Try and wear compression socks on a regular basis (and/or keep your legs elevated when seated for a prolonged period of time.

## 2017-12-25 LAB — CBC WITH DIFFERENTIAL/PLATELET
BASOS: 0 %
Basophils Absolute: 0 10*3/uL (ref 0.0–0.2)
EOS (ABSOLUTE): 0.1 10*3/uL (ref 0.0–0.4)
EOS: 1 %
HEMATOCRIT: 52.5 % — AB (ref 37.5–51.0)
Hemoglobin: 17 g/dL (ref 13.0–17.7)
Immature Grans (Abs): 0 10*3/uL (ref 0.0–0.1)
Immature Granulocytes: 0 %
LYMPHS ABS: 1.4 10*3/uL (ref 0.7–3.1)
Lymphs: 19 %
MCH: 27.3 pg (ref 26.6–33.0)
MCHC: 32.4 g/dL (ref 31.5–35.7)
MCV: 84 fL (ref 79–97)
MONOS ABS: 0.7 10*3/uL (ref 0.1–0.9)
Monocytes: 10 %
Neutrophils Absolute: 5.2 10*3/uL (ref 1.4–7.0)
Neutrophils: 70 %
Platelets: 206 10*3/uL (ref 150–379)
RBC: 6.23 x10E6/uL — ABNORMAL HIGH (ref 4.14–5.80)
RDW: 17.4 % — AB (ref 12.3–15.4)
WBC: 7.4 10*3/uL (ref 3.4–10.8)

## 2017-12-25 LAB — COMPREHENSIVE METABOLIC PANEL
A/G RATIO: 1.8 (ref 1.2–2.2)
ALK PHOS: 50 IU/L (ref 39–117)
ALT: 47 IU/L — ABNORMAL HIGH (ref 0–44)
AST: 35 IU/L (ref 0–40)
Albumin: 4.5 g/dL (ref 3.5–4.8)
BILIRUBIN TOTAL: 1.1 mg/dL (ref 0.0–1.2)
BUN/Creatinine Ratio: 13 (ref 10–24)
BUN: 15 mg/dL (ref 8–27)
CO2: 28 mmol/L (ref 20–29)
Calcium: 9.5 mg/dL (ref 8.6–10.2)
Chloride: 98 mmol/L (ref 96–106)
Creatinine, Ser: 1.13 mg/dL (ref 0.76–1.27)
GFR calc Af Amer: 73 mL/min/{1.73_m2} (ref >59)
GFR, EST NON AFRICAN AMERICAN: 63 mL/min/{1.73_m2} (ref >59)
GLOBULIN, TOTAL: 2.5 g/dL (ref 1.5–4.5)
Glucose: 102 mg/dL — ABNORMAL HIGH (ref 65–99)
POTASSIUM: 4.3 mmol/L (ref 3.5–5.2)
Sodium: 142 mmol/L (ref 134–144)
Total Protein: 7 g/dL (ref 6.0–8.5)

## 2017-12-25 LAB — LIPID PANEL
CHOL/HDL RATIO: 3.6 ratio (ref 0.0–5.0)
Cholesterol, Total: 144 mg/dL (ref 100–199)
HDL: 40 mg/dL (ref >39)
LDL Calculated: 65 mg/dL (ref 0–99)
TRIGLYCERIDES: 195 mg/dL — AB (ref 0–149)
VLDL Cholesterol Cal: 39 mg/dL (ref 5–40)

## 2017-12-25 LAB — TSH: TSH: 2.64 u[IU]/mL (ref 0.450–4.500)

## 2018-01-15 DIAGNOSIS — G4733 Obstructive sleep apnea (adult) (pediatric): Secondary | ICD-10-CM | POA: Diagnosis not present

## 2018-03-02 DIAGNOSIS — E291 Testicular hypofunction: Secondary | ICD-10-CM | POA: Diagnosis not present

## 2018-03-09 DIAGNOSIS — N5201 Erectile dysfunction due to arterial insufficiency: Secondary | ICD-10-CM | POA: Diagnosis not present

## 2018-03-09 DIAGNOSIS — E291 Testicular hypofunction: Secondary | ICD-10-CM | POA: Diagnosis not present

## 2018-04-13 DIAGNOSIS — R69 Illness, unspecified: Secondary | ICD-10-CM | POA: Diagnosis not present

## 2018-04-21 DIAGNOSIS — R69 Illness, unspecified: Secondary | ICD-10-CM | POA: Diagnosis not present

## 2018-04-27 DIAGNOSIS — H9312 Tinnitus, left ear: Secondary | ICD-10-CM | POA: Diagnosis not present

## 2018-04-27 DIAGNOSIS — H903 Sensorineural hearing loss, bilateral: Secondary | ICD-10-CM | POA: Diagnosis not present

## 2018-04-27 DIAGNOSIS — Z822 Family history of deafness and hearing loss: Secondary | ICD-10-CM | POA: Diagnosis not present

## 2018-04-27 DIAGNOSIS — Z77122 Contact with and (suspected) exposure to noise: Secondary | ICD-10-CM | POA: Diagnosis not present

## 2018-05-15 ENCOUNTER — Other Ambulatory Visit: Payer: Self-pay | Admitting: Cardiology

## 2018-05-15 DIAGNOSIS — I4891 Unspecified atrial fibrillation: Secondary | ICD-10-CM

## 2018-05-25 NOTE — Progress Notes (Signed)
HPI: FU coronary artery disease, PAF,hypertension, and hyperlipidemia. Cath 7/15 with severe CAD and EF 50. He underwent bypass grafting x2 with LIMA to the LAD and vein graft to the first obtuse marginal branch of the left circumflex. Abd ultrasound 9/15 showed no aneurysm. Also with h/o aflutter, PAF and CVA. Nuclear study 1/18 showed EF 38, inferior MI and mild peri-infarct ischemia. Carotid dopplers 1/18 showed 1-39 bilateral stenosis.  Last echocardiogram October 2018 showed normal LV function, moderate diastolic dysfunction, mild aortic stenosis with mean gradient 10 mmHg and trace aortic insufficiency.  There was mild tricuspid regurgitation and moderate pulmonary hypertension.  Since last seen,  he continues to have some dyspnea on exertion but no orthopnea, PND, chest pain or syncope.  Mild pedal edema.  Current Outpatient Medications  Medication Sig Dispense Refill  . acetaminophen (TYLENOL) 500 MG tablet Take 1,000 mg by mouth at bedtime as needed for mild pain.     Marland Kitchen amoxicillin (AMOXIL) 500 MG capsule TAKE 4 CAPSULES BY MOUTH 1 HOUR PRE DENTAL APPOINTMENT  1  . atorvastatin (LIPITOR) 80 MG tablet TAKE 1 TABLET BY MOUTH EVERY EVENING 90 tablet 3  . CALCIUM-MAGNESIUM-ZINC PO Take 1 tablet by mouth at bedtime.     . cetirizine (ZYRTEC) 10 MG tablet Take 10 mg by mouth daily.    . Cholecalciferol (VITAMIN D) 2000 UNITS tablet Take 4,000 Units by mouth at bedtime.     . Coenzyme Q10 (CO Q-10) 100 MG CAPS Take 100 mg by mouth at bedtime.     . Diclofenac Sodium 3 % GEL APPLY UP TO TWICE DAILY AS DIRECTED  3  . dofetilide (TIKOSYN) 500 MCG capsule TAKE ONE CAPSULE BY MOUTH TWICE A DAY 180 capsule 3  . ELIQUIS 5 MG TABS tablet TAKE 1 TABLET BY MOUTH TWICE A DAY 180 tablet 0  . fluticasone (FLONASE) 50 MCG/ACT nasal spray USE 2 SPRAYS NASALLY DAILY 48 g 1  . furosemide (LASIX) 40 MG tablet TAKE 1&1/2 TABLET BY MOUTH DAILY 135 tablet 3  . metoprolol tartrate (LOPRESSOR) 25 MG tablet  TAKE 1 TABLET BY MOUTH 2 TIMES A DAY 180 tablet 3  . Multiple Vitamin (MULTIVITAMIN WITH MINERALS) TABS tablet Take 1 tablet by mouth daily. Centrum silver    . nitroGLYCERIN (NITROSTAT) 0.4 MG SL tablet Place 1 tablet (0.4 mg total) under the tongue every 5 (five) minutes as needed for chest pain. 25 tablet 12  . omeprazole (PRILOSEC) 20 MG capsule Take 20 mg by mouth every other day.     . testosterone cypionate (DEPOTESTOTERONE CYPIONATE) 200 MG/ML injection Inject 200 mg into the muscle every 14 (fourteen) days.     Marland Kitchen losartan (COZAAR) 50 MG tablet TAKE 1 TABLET (50 MG TOTAL) BY MOUTH DAILY. 90 tablet 3   No current facility-administered medications for this visit.      Past Medical History:  Diagnosis Date  . Adenomatous colon polyp 01/2015   Dr. Penelope Coop  . Allergy    RHINITIS  . Arthritis   . Atrial fibrillation ([place])   . Benign prostatic hypertrophy    followed by Dr. Gaynelle Arabian; regrowth R lat lob (11/2015)  . CAD (coronary artery disease)   . Cataracts, bilateral   . Cerebrovascular disease   . Diverticular disease   . Diverticulosis    seen on colonoscopy (9/05, 06/2009)  . Erectile dysfunction   . Family history of adverse reaction to anesthesia    mother had memeory problems after anesthesia with  hip replacement, lasted until she died 2 to 3 years later  . Gallstones   . GERD (gastroesophageal reflux disease)   . 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 Hepatitis age 73   "called yellow jaundice"  . HTN (hypertension)   . Hyperlipidemia   . Hyperplastic colon polyp 05/2004   tubular adenoma 01/2015  . Hypogonadism male    treated by Dr. Gaynelle Arabian  . Myocardial infarction [facility]) '98, '09   (Dr. Stanford Breed); s/p stents  . Nocturia   . Obesity   . Peripheral vascular disease ([place])    02/28/12 MOST RECENT CAROTID DUPLEX--"STABLE MILD CALCIFIED PLAQUE, BILATERALY, STABLE, OVER SERIAL EXAMS"  . Pneumonia age 82   hx of  . Prostatitis   . 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 Sepsis due to Escherichia coli [facility]) ? 2011   DUE TO URINARY OUTLET OBSTRUCTION /INFECTION  . Shortness of breath dyspnea    with exertion only  . Sleep apnea    mild, CPAP wasn't indicated (per pt)--2014 mod-severe, on CPAP since 07/2013  . Stroke [facility]) jan 2016  . UTI (urinary tract infection)    secondary  . Visit for monitoring Tikosyn therapy     Past Surgical History:  Procedure Laterality Date  . CORONARY ANGIOPLASTY WITH STENT PLACEMENT  01/1997, stent x 1, 2009 2nd stent in same location   PTCA/stent to RCA  AND 2009 2ND STENT PLACED  . CORONARY ARTERY BYPASS GRAFT N/A 04/15/2014   Procedure: CORONARY ARTERY BYPASS GRAFTING TIMES TWO USEING LEFT INTERNAL MAMMARY ARTERY AND RIGHT GREATER SAPHENOUS VEIN VIA ENDOVEIN HARVEST;  Surgeon: Rexene Alberts, MD;  Location: [place];  Service: Open Heart Surgery;  Laterality: N/A;  . CYSTOSCOPY  11/17/15   [place]  . GREEN LIGHT LASER TURP (TRANSURETHRAL RESECTION OF PROSTATE N/A 01/15/2016   Procedure: GREEN LIGHT LASER TURP (TRANSURETHRAL RESECTION OF PROSTATE;  Surgeon: Carolan Clines, MD;  Location: [facility];  Service: Urology;  Laterality: N/A;  LASER RIGHT LATERAL LOBE OF PROSTATE AND LEFT BLADDER NECK    . INTRAOPERATIVE TRANSESOPHAGEAL ECHOCARDIOGRAM N/A 04/15/2014   Procedure: INTRAOPERATIVE TRANSESOPHAGEAL ECHOCARDIOGRAM;  Surgeon: Rexene Alberts, MD;  Location: [place];  Service: Open Heart Surgery;  Laterality: N/A;  . JOINT REPLACEMENT     both knees  . KNEE SURGERY  05/2010   removing scar tissue R knee (Dr. Wynelle Link)  . LEFT AND RIGHT HEART CATHETERIZATION WITH CORONARY ANGIOGRAM  04/13/2014   Procedure: LEFT AND RIGHT HEART CATHETERIZATION WITH CORONARY ANGIOGRAM;  Surgeon: Sinclair Grooms, MD;  [facility] C[facility] LAB;  Service: Cardiovascular;;  . TONSILLECTOMY    . TOTAL KNEE ARTHROPLASTY  L 4/06, R 4/07  . TOTAL KNEE REVISION  09/23/2012   Procedure:  TOTAL KNEE REVISION;  Surgeon: Gearlean Alf, MD; right side  Location: [facility];  Service: Orthopedics;  Laterality: Right;  . TRANSURETHRAL RESECTION OF PROSTATE  06/2010  . VASECTOMY      Social History   Socioeconomic History  . Marital status: Married    Spouse name: Not on file  . Number of children: 4  . Years of education: Not on file  . Highest education level: Not on file  Occupational History  . Occupation: Engineer, maintenance (IT)  Social Needs  . Financial resource strain: Not on file  . Food insecurity:    Worry:  Not on file    Inability: Not on file  . Transportation needs:    Medical: Not on file    Non-medical: Not on file  Tobacco Use  . Smoking status: Former Smoker    Packs/day: 1.50    Years: 30.00    Pack years: 45.00    Types: Cigarettes    Last attempt to quit: 09/16/1986    Years since quitting: 31.7  . Smokeless tobacco: Never Used  . Tobacco comment: smoked 1-3 PPD from age 64 to 63  Substance and Sexual Activity  . Alcohol use: Yes    Alcohol/week: 1.0 standard drinks    Types: 1 Glasses of wine per week    Comment: 1 glass of wine 3-4 times a week  . Drug use: No  . Sexual activity: Not Currently  Lifestyle  . Physical activity:    Days per week: Not on file    Minutes per session: Not on file  . Stress: Not on file  Relationships  . Social connections:    Talks on phone: Not on file    Gets together: Not on file    Attends religious service: Not on file    Active member of club or organization: Not on file    Attends meetings of clubs or organizations: Not on file    Relationship status: Not on file  . Intimate partner violence:    Fear of current or ex partner: Not on file    Emotionally abused: Not on file    Physically abused: Not on file    Forced sexual activity: Not on file  Other Topics Concern  . Not on file  Social History Narrative   Lives with wife.  4 kids, 1 stepchild (1 in [place], 4 in [place]). 11 grandchildren, 1 great grandchild.   No pets.  Wife is a diabetic.   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 Coronary artery disease Mother        deceased  . Dementia Mother   . Atrial fibrillation Mother   . Stroke Mother        related to atrial fib  . Coronary artery disease Father        deceased  . Hypertension Father   . Heart disease Father   . Hypertension Paternal Grandmother   . Heart disease Paternal Grandmother   . Hypertension Paternal Grandfather   . Heart disease Paternal Grandfather   . Hypothyroidism Daughter        congenital  . Cancer Paternal Uncle        colon; died in his 47's  . Colon cancer Paternal Uncle   . Diabetes Neg Hx     ROS: no fevers or chills, productive cough, hemoptysis, dysphasia, odynophagia, melena, hematochezia, dysuria, hematuria, rash, seizure activity, orthopnea, PND, pedal edema, claudication. Remaining systems are negative.  Physical Exam: Well-developed well-nourished in no acute distress.  Skin is warm and dry.  HEENT is normal.  Neck is supple.  Chest is clear to auscultation with normal expansion.  Cardiovascular exam is regular rate and rhythm.  Abdominal exam nontender or distended. No masses palpated. Extremities show no edema. neuro grossly intact  ECG-sinus rhythm at a rate of 68.  Septal infarct.  Inferior infarct.  Personally reviewed  A/P  1 coronary disease status post coronary artery bypass graft-patient doing well with no chest pain.  Continue medical therapy with statin.  He is not on aspirin given need for apixaban.  2 paroxysmal atrial fibrillation-patient is in sinus rhythm on examination today.  Continue metoprolol for rate control if atrial fibrillation recurs.  Continue Tikosyn for rhythm control.  Continue apixaban.  Check hemoglobin, renal function and magnesium.  3 hypertension-patient's blood pressure is mildly elevated.  Increase Cozaar to 100 mg daily.  In 1 week check potassium  and renal function.  4 chronic diastolic congestive heart failure-patient appears to be euvolemic today.  I will continue present dose of Lasix.  We discussed the importance of fluid restriction and low-sodium diet.  Check potassium and renal function.  Note some of his dyspnea has been felt to be multifactorial including COPD, obesity and obstructive sleep apnea.  Check BNP.  5 history of mild aortic stenosis-he will need follow-up echoes in the future.  6 carotid artery disease-continue statin.  7 hyperlipidemia-continue statin.    8 sleep apnea-continue CPAP.  Kirk Ruths, MD

## 2018-05-26 ENCOUNTER — Encounter: Payer: Self-pay | Admitting: Cardiology

## 2018-05-26 ENCOUNTER — Ambulatory Visit: Payer: Medicare HMO | Admitting: Cardiology

## 2018-05-26 VITALS — BP 136/70 | HR 68 | Ht 68.0 in | Wt 276.0 lb

## 2018-05-26 DIAGNOSIS — I2510 Atherosclerotic heart disease of native coronary artery without angina pectoris: Secondary | ICD-10-CM

## 2018-05-26 DIAGNOSIS — Z79899 Other long term (current) drug therapy: Secondary | ICD-10-CM

## 2018-05-26 DIAGNOSIS — I480 Paroxysmal atrial fibrillation: Secondary | ICD-10-CM | POA: Diagnosis not present

## 2018-05-26 DIAGNOSIS — I10 Essential (primary) hypertension: Secondary | ICD-10-CM

## 2018-05-26 DIAGNOSIS — E7800 Pure hypercholesterolemia, unspecified: Secondary | ICD-10-CM

## 2018-05-26 MED ORDER — LOSARTAN POTASSIUM 100 MG PO TABS: 100.0000 mg | ORAL_TABLET | Freq: Every day | ORAL | 3 refills | Status: DC

## 2018-05-26 NOTE — Patient Instructions (Addendum)
Medication Instructions: Your Physician recommend you make the following changes to your medication. Increase: Cozaar 100 mg daily   If you need a refill on your cardiac medications before your next appointment, please call your pharmacy.   Labwork: Your physician recommends that you return for lab work in one week (CBC, BMP, Mg, BNP)   Procedures/Testing: None  Follow-Up: Your physician wants you to follow-up in 6 months with Dr. Trellis Moment will receive a reminder letter in the mail two months in advance. If you don't receive a letter, please call our office at [phone] to schedule this follow-up appointment.   Special Instructions:    Thank you for choosing [facility] at [facility]!!

## 2018-06-02 DIAGNOSIS — I10 Essential (primary) hypertension: Secondary | ICD-10-CM | POA: Diagnosis not present

## 2018-06-02 DIAGNOSIS — Z79899 Other long term (current) drug therapy: Secondary | ICD-10-CM | POA: Diagnosis not present

## 2018-06-03 LAB — CBC
Hematocrit: 52.1 % — ABNORMAL HIGH (ref 37.5–51.0)
Hemoglobin: 17.4 g/dL (ref 13.0–17.7)
MCH: 28.2 pg (ref 26.6–33.0)
MCHC: 33.4 g/dL (ref 31.5–35.7)
MCV: 84 fL (ref 79–97)
PLATELETS: 187 10*3/uL (ref 150–450)
RBC: 6.17 x10E6/uL — AB (ref 4.14–5.80)
RDW: 16.5 % — ABNORMAL HIGH (ref 12.3–15.4)
WBC: 7 10*3/uL (ref 3.4–10.8)

## 2018-06-03 LAB — BASIC METABOLIC PANEL
BUN / CREAT RATIO: 13 (ref 10–24)
BUN: 14 mg/dL (ref 8–27)
CO2: 25 mmol/L (ref 20–29)
CREATININE: 1.06 mg/dL (ref 0.76–1.27)
Calcium: 9.9 mg/dL (ref 8.6–10.2)
Chloride: 97 mmol/L (ref 96–106)
GFR calc Af Amer: 78 mL/min/{1.73_m2} (ref >59)
GFR calc non Af Amer: 68 mL/min/{1.73_m2} (ref >59)
Glucose: 102 mg/dL — ABNORMAL HIGH (ref 65–99)
Potassium: 4.8 mmol/L (ref 3.5–5.2)
SODIUM: 143 mmol/L (ref 134–144)

## 2018-06-03 LAB — BRAIN NATRIURETIC PEPTIDE: BNP: 102 pg/mL — ABNORMAL HIGH (ref 0.0–100.0)

## 2018-06-03 LAB — MAGNESIUM: Magnesium: 1.9 mg/dL (ref 1.6–2.3)

## 2018-06-28 NOTE — Progress Notes (Signed)
Chief Complaint  Patient presents with  . Medication Management   Woke up a month ago with right great toe pain.  He wonders if he could have gout.  He thinks it is worse if he has shrimp, but also states he is unwilling to give up the shrimp.  It was slightly swollen, not red, painful, but able to walk on and wear a shoe normally. Denies pain today.  At his visit in April, he reported having cough productive of white phlegm x 1 year. We discussed getting CXR and then referring him to pulmonary if no treatable cause found. He somehow  Misunderstood, never went for the x-ray. He still coughs just a few times/day, clear-white phlegm, but overall is a little better than at last visit. "It isn't bad enough for me to do anything with", not bothersome, definitely not worsening. He feels like there is postnasal drainage, despite taking zyrtec and flonase daily.  He denies sneezing, runny nose. He wonders if it could be related to the CPAP machine (humidified).  GERD--controlled with meds. Compliant with taking prilosec daily, and denies heartburn. Denies dysphagia.Some chronic cough as above-- gets up clear-white mucus a few times/day--chronic and unchanged.   Diabetes. Diabetes was diagnosed 05/2013 based on A1c of 6.7.He has been controlled just with diet (only took metformin for a short time). He hasn't checked his sugars (wife has diabetes, checks regularly, but he doesn't check his). He has been following a diabetic diet (he tries to limit carbs and sugar in his diet). He goes to the fitness center, getting exercise 4-5x/week, 30 minutes on exercise bike plus weights. No polydipsia, polyuria, numbness, tingling, vision or feet concerns. Last eye exam was 06/2017 (has retinopathy) with Dr. Herbert Deaner.  Next visit is scheduled for November. Lab Results  Component Value Date   HGBA1C 6.1 12/24/2017   H/oIron deficiency, mild anemia (none recently):He is on chronic anticoagulation, denies any bleeding.  He no longer takes iron (stopped 09/2016). In 09/2016 Hg was elevated at 18.3. He is on testosterone replacement, and had therapeutic phlebotomy in 11/2016.  He is under the care of the urologist. Last CBC was ordered by Dr. Stanford Breed, and was WNL/stable. Lab Results  Component Value Date   WBC 7.0 06/02/2018   HGB 17.4 06/02/2018   HCT 52.1 (H) 06/02/2018   MCV 84 06/02/2018   PLT 187 06/02/2018   Hyperlipidemia: He reports compliance with taking 80mg  lipitor, denies side effects.  Is reportedly following a lowfat, low cholesterol diet. 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 He remains on Eliquis for stroke prevention for atrial fibrillation. He no longer sees Dr. Leonie Man. Goals recommended by neuro: BP <130/90, hemoglobin A1c goal below 6.5% and lipids with LDL cholesterol goal below 70 mg/dL.  Afib, HTN-- Dr. Stanford Breed increased his losartan dose to 100mg  at his last visit last month, for better blood pressure control.  He hasn't been checking his BP at home, has a monitor.  Denies headaches, dizziness, denies any chest pain, palpitations. Since being on Tikosyn, no longer noticing fib/flutter on machines at the gym (never has symptoms, only sees on machine pulse monitoring). He remains on Eliquis without any bleeding complications. He sees Dr. Stanford Breed regularly (see below).   Pulse remains low 80's, up  to mid-90's when exercising.  CAD: S/p CABG; stable. Not on aspirin due to being on eliquis.Last saw Dr. Stanford Breed last month.  He continues to have some breathlessness with exertion, unchanged from when last discussed with his cardiologist and studies.No SOB at rest. Finds himself breathing hard just walking through the house. He bumped his exercise bike time from 15 to 30 minutes, is breathing  hard by 10 minutes, but able to complete 30, and has no chest pain. Cardiologist previously mentioned thinking that some of his dyspnea may be secondary to history of obstructive sleep apnea, size and COPD.   Diastolic heart failure--on lasix per cardiologist.He takes lasix 1.5 tablets daily. He weighs himself daily, takes 2 tablets if weight is up over 2# (weighs daily). He was taken off the losartan and spironolactone due to high potassium levelsin the past, but is back on the losartan per current records, and dose was recently increased.  He had chem panel checked about a week after the dose increase, by his cardiologist.  Carotid artery stenosis--last [place] was 09/2016, and showed 1-39% blockage, no f/u needed per Dr. Stanford Breed. (in 04/2015, The right is 40-59% and the left is 1-39%)  OSA: compliant with CPAP use nightly. Cleans it daily.  Hypogonadismand BPH: under the care of urologist, Dr. Louis Meckel. He continues on testosterone injections, weekly.In December, testosterone level was high, dose was changed from 21mL qowk to 1/2 mL weekly, currently taking 1/2 mL every 8-9 days. He is s/pTURP.Denies any urinary retention.Up once a night to void. He continues to have ED.  He has Trimix injection for treatment. Wife gives injection, but really isn't interested in sexual relations.  He reports it has been minimal over the last 14 years, and he expresses some frustration regarding this.  Obesity: exercise regularly, eats small portions, healthy foods. 1 glass of wine 3-4/week. Unsweet tea when he goes out to eat.  Mostly drinks water. Wife is a Child psychotherapist and he admits to trying some of her baked goods.  PMH, PSH, SH reviewed  Outpatient Encounter Medications as of 06/29/2018  Medication Sig Note  . acetaminophen (TYLENOL) 500 MG tablet Take 1,000 mg by mouth at bedtime as needed for mild pain.    Marland Kitchen amoxicillin (AMOXIL) 500 MG capsule TAKE 4 CAPSULES BY MOUTH 1 HOUR PRE DENTAL APPOINTMENT   .  atorvastatin (LIPITOR) 80 MG tablet TAKE 1 TABLET BY MOUTH EVERY EVENING   . CALCIUM-MAGNESIUM-ZINC PO Take 1 tablet by mouth at bedtime.    . cetirizine (ZYRTEC) 10 MG tablet Take 10 mg by mouth daily.   . Cholecalciferol (VITAMIN D) 2000 UNITS tablet Take 4,000 Units by mouth at bedtime.    . Coenzyme Q10 (CO Q-10) 100 MG CAPS Take 100 mg by mouth at bedtime.    . Diclofenac Sodium 3 % GEL APPLY UP TO TWICE DAILY AS DIRECTED 03/14/2016: Uses on his head, per dermatologist  . dofetilide (TIKOSYN) 500 MCG capsule TAKE ONE CAPSULE BY MOUTH TWICE A DAY   . ELIQUIS 5 MG TABS tablet TAKE 1 TABLET BY MOUTH TWICE A DAY   . fluticasone (FLONASE) 50 MCG/ACT nasal spray USE 2 SPRAYS NASALLY DAILY   . furosemide (LASIX) 40 MG tablet TAKE 1&1/2 TABLET BY MOUTH DAILY   . losartan (COZAAR) 100 MG tablet Take 1 tablet (100 mg total) by mouth daily.   . metoprolol tartrate (LOPRESSOR) 25 MG tablet TAKE 1 TABLET BY MOUTH 2 TIMES A DAY   . Multiple Vitamin (MULTIVITAMIN WITH MINERALS) TABS  tablet Take 1 tablet by mouth daily. Centrum silver   . nitroGLYCERIN (NITROSTAT) 0.4 MG SL tablet Place 1 tablet (0.4 mg total) under the tongue every 5 (five) minutes as needed for chest pain.   Marland Kitchen omeprazole (PRILOSEC) 20 MG capsule Take 20 mg by mouth every other day.    . testosterone cypionate (DEPOTESTOTERONE CYPIONATE) 200 MG/ML injection Inject 200 mg into the muscle every 14 (fourteen) days.  12/24/2017: Now taking 100mg  every week (1/2 ML qweek) per urologist   No facility-administered encounter medications on file as of 06/29/2018.     ROS:  No fever, chills, headaches, dizziness, chest pain, palpitations.   Slight allergies/PND, cough and DOE per HPI Swelling in feet and ankles, some days more than others. Tries to keep legs elevated.  Doesn't like compression socks, will sometimes wear them when he wears long pants, which he rarely wears. +hearing loss, wears hearing aids Some bilateral knee pain/stiffness,  unchanged.  No giving way or swelling. +ED per HPI +right great toe pain intermittently, per HPI   PHYSICAL EXAM:  BP 130/80   Pulse 64   Temp 98.5 F (36.9 C) (Oral)   Ht 5\' 8"  (1.727 m)   Wt 276 lb 9.6 oz (125.5 kg)   SpO2 97%   BMI 42.06 kg/m    Wt Readings from Last 3 Encounters:  05/26/18 276 lb (125.2 kg)  12/24/17 277 lb 3.2 oz (125.7 kg)  07/10/17 283 lb (128.4 kg)   Pleasant, obese male, in good spirits overall. HEENT: PERRL, EOMI, conjunctiva clear. OP clear. Nasal mucosa with mild edema, no drainage or purulence. Septal deviation noted, R side narrower. Sinuses nontender Neck: no lymphadenopathy, thyromegaly or mass Heart: Regular rate and rhythm, 2-3/6 murmur noted at RUSB Lungs: clear bilaterally. No wheezes, rales, ronchi. No elevated JVP, able to lay flat comfortably. Abdomen: obese, soft, no mass. Diastasis recti, nontender Back: no spinal or CVA tenderness.  SI joints nontender (but reports periodic pain in that area. Extremities: Trace pretibial edema Psych: normal mood, affect, hygiene and grooming Neuro: alert and oriented. Cranial nerves intact. Normal strength, gait  Lab Results  Component Value Date   HGBA1C 6.5 (A) 06/29/2018    ASSESSMENT/PLAN:  Type 2 diabetes mellitus with retinopathy, without long-term current use of insulin, macular edema presence unspecified, unspecified laterality, unspecified retinopathy severity (HCC) - A1c is back up to 6.5. Start metformin (to minimize complics, ? poss help with wt loss). Wt loss encouraged, disc diet. f/u ophtho Nov - Plan: HgB A1c  DM (diabetes mellitus), type 2 with peripheral vascular complications (HCC) - Plan: metFORMIN (GLUCOPHAGE-XR) 500 MG 24 hr tablet  Essential hypertension - borderline BP (higher on recheck)--encouraged monitoring at home, goal <130/80. Cont current meds  Pure hypercholesterolemia - LDL at goal per last check, TG were elevated. Discussed proper diet. Cont statin. Consider  fish oil (stop if cough worsens)  Obstructive sleep apnea - cont CPAP; consider decreasing humidification as trial to see if helps with phlegm/cough  Gastroesophageal reflux disease, esophagitis presence not specified - controlled  Coronary artery disease involving native coronary artery of native heart without angina pectoris - stable/asymptomatic on current regimen  Paroxysmal atrial fibrillation ([place]) - stable; cont anticoag and meds per cardiologist  Needs flu shot - Plan: Flu vaccine HIGH DOSE PF (Fluzone High dose), HgB A1c  Great toe pain, right - flare with shrimp could be c/w gout, but severity seems mild. Check uric acid level - Plan: Uric Acid  Class 3 severe  obesity due to excess calories with serious comorbidity and body mass index (BMI) of 40.0 to 44.9 in adult [facility]) - counseled re: risks, diet, exercise. Rec referral to wt loss clinic, declines. cut back on baked goods, alcohol, start metformin. f/u 3 mos  Hypogonadism, testicular - there appears to be mismatch of libido between pt and wife, causing pt great frustration. Encouraged counseling, consider cutting back on T dose  40-45 min visit, more than 1/2 spent counseling--re: DM, weight, diet/exercise, obesity, weight loss, ED, testosterone, issues with wife, .   F/u--OV 3 mos, A1c at visit. CPE/AWV 6 mos  Please talk to your wife regarding your frustrations related to intercourse, libido, etc.  You may both benefit from counseling in regards to this. Consider cutting back on the testosterone if libidos not matching is a problem.  I strongly encourage weight loss, and I hear your frustration with regard to your eating/diet.  I highly recommend that you consider the [facility] (Dr. Leafy Ro, Dr. Adair Patter) to assist you with weight loss.  This will help with your breathing, and overall improve your health.  Start metformin once daily, take it in the morning prior to or with breakfast. If you develop  any loose stools or GI side effects, metamucil can help.  Consider taking omega-3 fish oil (do not take this if it makes your cough worse).  Periodically check your blood pressure at home and record. If you are feeling bad, also check your blood sugar (to see if high or low).

## 2018-06-29 ENCOUNTER — Encounter: Payer: Self-pay | Admitting: Family Medicine

## 2018-06-29 ENCOUNTER — Ambulatory Visit (INDEPENDENT_AMBULATORY_CARE_PROVIDER_SITE_OTHER): Payer: Medicare HMO | Admitting: Family Medicine

## 2018-06-29 VITALS — BP 130/80 | HR 64 | Temp 98.5°F | Ht 68.0 in | Wt 276.6 lb

## 2018-06-29 DIAGNOSIS — E11319 Type 2 diabetes mellitus with unspecified diabetic retinopathy without macular edema: Secondary | ICD-10-CM | POA: Diagnosis not present

## 2018-06-29 DIAGNOSIS — Z23 Encounter for immunization: Secondary | ICD-10-CM | POA: Diagnosis not present

## 2018-06-29 DIAGNOSIS — I10 Essential (primary) hypertension: Secondary | ICD-10-CM | POA: Diagnosis not present

## 2018-06-29 DIAGNOSIS — I480 Paroxysmal atrial fibrillation: Secondary | ICD-10-CM

## 2018-06-29 DIAGNOSIS — M79674 Pain in right toe(s): Secondary | ICD-10-CM | POA: Diagnosis not present

## 2018-06-29 DIAGNOSIS — I2510 Atherosclerotic heart disease of native coronary artery without angina pectoris: Secondary | ICD-10-CM | POA: Diagnosis not present

## 2018-06-29 DIAGNOSIS — Z6841 Body Mass Index (BMI) 40.0 and over, adult: Secondary | ICD-10-CM

## 2018-06-29 DIAGNOSIS — G4733 Obstructive sleep apnea (adult) (pediatric): Secondary | ICD-10-CM

## 2018-06-29 DIAGNOSIS — E7800 Pure hypercholesterolemia, unspecified: Secondary | ICD-10-CM

## 2018-06-29 DIAGNOSIS — K219 Gastro-esophageal reflux disease without esophagitis: Secondary | ICD-10-CM

## 2018-06-29 DIAGNOSIS — E1151 Type 2 diabetes mellitus with diabetic peripheral angiopathy without gangrene: Secondary | ICD-10-CM | POA: Diagnosis not present

## 2018-06-29 DIAGNOSIS — E291 Testicular hypofunction: Secondary | ICD-10-CM

## 2018-06-29 LAB — POCT GLYCOSYLATED HEMOGLOBIN (HGB A1C): HEMOGLOBIN A1C: 6.5 % — AB (ref 4.0–5.6)

## 2018-06-29 MED ORDER — METFORMIN HCL ER 500 MG PO TB24: 500.0000 mg | ORAL_TABLET | Freq: Every day | ORAL | 1 refills | Status: DC

## 2018-06-29 NOTE — Patient Instructions (Addendum)
  Please talk to your wife regarding your frustrations related to intercourse, libido, etc.  You may both benefit from counseling in regards to this. Consider cutting back on the testosterone if libidos not matching is a problem.  I strongly encourage weight loss, and I hear your frustration with regard to your eating/diet.  I highly recommend that you consider the [facility] (Dr. Leafy Ro, Dr. Adair Patter) to assist you with weight loss.  This will help with your breathing, and overall improve your health.  Start metformin once daily, take it in the morning prior to or with breakfast. If you develop any loose stools or GI side effects, metamucil can help.  Consider taking omega-3 fish oil (do not take this if it makes your cough worse).  Periodically check your blood pressure at home and record. If you are feeling bad, also check your blood sugar (to see if high or low).

## 2018-06-30 ENCOUNTER — Encounter: Payer: Self-pay | Admitting: Family Medicine

## 2018-06-30 LAB — URIC ACID: URIC ACID: 8.6 mg/dL (ref 3.7–8.6)

## 2018-07-21 DIAGNOSIS — E119 Type 2 diabetes mellitus without complications: Secondary | ICD-10-CM | POA: Diagnosis not present

## 2018-07-21 DIAGNOSIS — H25013 Cortical age-related cataract, bilateral: Secondary | ICD-10-CM | POA: Diagnosis not present

## 2018-07-21 DIAGNOSIS — H11003 Unspecified pterygium of eye, bilateral: Secondary | ICD-10-CM | POA: Diagnosis not present

## 2018-07-21 DIAGNOSIS — H35033 Hypertensive retinopathy, bilateral: Secondary | ICD-10-CM | POA: Diagnosis not present

## 2018-07-21 DIAGNOSIS — H2513 Age-related nuclear cataract, bilateral: Secondary | ICD-10-CM | POA: Diagnosis not present

## 2018-07-21 LAB — HM DIABETES EYE EXAM

## 2018-08-07 ENCOUNTER — Other Ambulatory Visit: Payer: Self-pay | Admitting: Cardiology

## 2018-08-07 DIAGNOSIS — I4891 Unspecified atrial fibrillation: Secondary | ICD-10-CM

## 2018-08-24 DIAGNOSIS — E291 Testicular hypofunction: Secondary | ICD-10-CM | POA: Diagnosis not present

## 2018-08-31 DIAGNOSIS — N5201 Erectile dysfunction due to arterial insufficiency: Secondary | ICD-10-CM | POA: Diagnosis not present

## 2018-08-31 DIAGNOSIS — E291 Testicular hypofunction: Secondary | ICD-10-CM | POA: Diagnosis not present

## 2018-09-04 ENCOUNTER — Other Ambulatory Visit: Payer: Self-pay | Admitting: Cardiology

## 2018-09-07 ENCOUNTER — Other Ambulatory Visit: Payer: Self-pay | Admitting: Cardiology

## 2018-09-27 NOTE — Progress Notes (Signed)
Chief Complaint  Patient presents with  . Diabetes    nonfasting med check.   . Cough    x 7 days, no fever but has had some sweats in the am.  . Foreign Body in Ear    thinks he may have a bug in his left ear and would like for you to take a look.    Patient presents for 3 month follow-up on diabetes.  Diabetes was diagnosed 05/2013 based on A1c of 6.7.He had been controlled just with diet (only took metformin for a short time initially), until his last visit in 06/2018, at which time his A1c was back up to 6.5, and Metformin was started. His A1c had been 6.1 in 12/2017, and 5.9 in 09/2016. He is tolerating Metformin with side effects.  He hasn't checked his sugars (wife has diabetes, checks regularly, but he doesn't check his). He has been following a diabetic diet (he tries to limit carbs and sugar in his diet). He goes to the fitness center, getting exercise 4-5x/week (less in the last week due to cough), 30 minutes on exercise bike plus weights.  No polydipsia, polyuria, numbness, tingling, vision or feet concerns. Last eye exam was 07/2018, no retinopathy.  Patient is also complaining of cough x 1 week, and possibly a bug in his left ear. He feels a vibration/tickle periodically in the ear, which feels as though something is moving.  This occurs when listening to music in his car. This occurs several times/day.  Denies allergies, postnasal drainage. He woke up one morning with some increased congestion than his norm.  (he chronically coughs up phlegm throughout the day). He feels like it is settled in his chest and can't get it up.  Feels like he is wheezing, especially in the evenings.  He has been taking 12 hour Mucinex (not today) for the last 3 days, which seems to help some.  He takes Delsym before bedtime, but affects his prostate some, but it helps.  Taking just 1/2 dose at bedtime.  Feels like he has had a cold, increased congestion which settled in his chest. Denies any reflux.  At  his October visit he had reported right great toe pain when he ate shrimp.  His uric acid level was elevated at 8.6.  He reported at that time being unwilling to give up shrimp. He did cut back on the frequency of eating shrimp, and hasn't had any further significant toe pain. We sent him detailed information about a low purine diet through MyChart however she reports he did not read/get this.  Obesity:exercise regularly, eats small portions, healthy foods. 1 glass of wine 3-4/week. Unsweet tea when he goes out to eat. Mostly drinks water. Wife is a Child psychotherapist and he admits to still trying some of her baked goods.  PMH, PSH, SH reviewed  Outpatient Encounter Medications as of 09/28/2018  Medication Sig Note  . acetaminophen (TYLENOL) 500 MG tablet Take 1,000 mg by mouth at bedtime as needed for mild pain.    Marland Kitchen atorvastatin (LIPITOR) 80 MG tablet TAKE 1 TABLET BY MOUTH EVERY EVENING   . CALCIUM-MAGNESIUM-ZINC PO Take 1 tablet by mouth at bedtime.    . cetirizine (ZYRTEC) 10 MG tablet Take 10 mg by mouth daily.   . Cholecalciferol (VITAMIN D) 2000 UNITS tablet Take 4,000 Units by mouth at bedtime.    . Coenzyme Q10 (CO Q-10) 100 MG CAPS Take 100 mg by mouth at bedtime.    . dofetilide (TIKOSYN) 500  MCG capsule TAKE ONE CAPSULE BY MOUTH TWICE A DAY   . ELIQUIS 5 MG TABS tablet TAKE 1 TABLET BY MOUTH TWICE A DAY   . fluticasone (FLONASE) 50 MCG/ACT nasal spray USE 2 SPRAYS NASALLY DAILY   . furosemide (LASIX) 40 MG tablet TAKE 1&1/2 TABLET BY MOUTH DAILY 09/28/2018: Takes 2 if his weight increases by a pound.   Marland Kitchen guaiFENesin (MUCINEX) 600 MG 12 hr tablet Take by mouth 2 (two) times daily. 09/28/2018: Using x 3 days, none yet taken today  . losartan (COZAAR) 100 MG tablet Take 1 tablet (100 mg total) by mouth daily.   . metFORMIN (GLUCOPHAGE-XR) 500 MG 24 hr tablet Take 1 tablet (500 mg total) by mouth daily with breakfast.   . metoprolol tartrate (LOPRESSOR) 25 MG tablet TAKE 1 TABLET BY MOUTH 2 TIMES A  DAY   . Multiple Vitamin (MULTIVITAMIN WITH MINERALS) TABS tablet Take 1 tablet by mouth daily. Centrum silver   . omeprazole (PRILOSEC) 20 MG capsule Take 20 mg by mouth every other day.    . testosterone cypionate (DEPOTESTOTERONE CYPIONATE) 200 MG/ML injection Inject 200 mg into the muscle every 14 (fourteen) days.  12/24/2017: Now taking 100mg  every week (1/2 ML qweek) per urologist  . amoxicillin (AMOXIL) 500 MG capsule TAKE 4 CAPSULES BY MOUTH 1 HOUR PRE DENTAL APPOINTMENT   . Diclofenac Sodium 3 % GEL APPLY UP TO TWICE DAILY AS DIRECTED 03/14/2016: Uses on his head, per dermatologist  . nitroGLYCERIN (NITROSTAT) 0.4 MG SL tablet Place 1 tablet (0.4 mg total) under the tongue every 5 (five) minutes as needed for chest pain. (Patient not taking: Reported on 09/28/2018)    No facility-administered encounter medications on file as of 09/28/2018.     ROS: Denies headaches, dizziness, denies any chest pain, palpitations.Denies bleedng, bruising, rash. Denies fever, chills. Sweats some in the mornings over the last week. +cough, per HPI Denies nausea, vomiting, heartburn, dysphagia, bowel changes.   PHYSICAL EXAM:  BP 136/84   Pulse 68   Temp 98.5 F (36.9 C) (Tympanic)   Ht 5' 7.5" (1.715 m)   Wt 273 lb 12.8 oz (124.2 kg)   BMI 42.25 kg/m   Wt Readings from Last 3 Encounters:  09/28/18 273 lb 12.8 oz (124.2 kg)  06/29/18 276 lb 9.6 oz (125.5 kg)  05/26/18 276 lb (125.2 kg)    Pleasant, obese male, in good spirits overall. He has audible rattling in chest intermittently.  Speaks easily, in no distress. Some wet-sounding rattly cough, but unable to get it up. HEENT: EOMI, conjunctiva clear. OP clear. Left ear--a small hair is noted, and some dry cerumen, otherwise appears completely normal. (right ear not examined). Nasal mucosa--some crusting noted on the right. +septal deviation, smaller passage on the right. Sinuses are nontender.  Neck: no lymphadenopathy, thyromegaly or  mass Heart: Regularrate andrhythm, 2-3/6 murmur noted at RUSB Lungs: Able to lay flat comfortably. Ronchi and some wheezing noted throughout.  Temporarily cleared with cough, but short-lived.  Fair air movement. Abdomen: obese, soft, no mass. Diastasis recti, nontender Back: no spinal or CVA tenderness. Extremities: Trace pretibial edema Psych: normal mood, affect, hygiene and grooming Neuro: alert and oriented. Cranial nerves intact. Normal gait  After ear lavage--significant cerumen removed.  There still appeared to be a dark hair (which is likely attached to canal). He tolerated the procedure well. After albuterol nebulizer--he felt better.  Still had some wheezing and ronchi, but improved air movement noted.  Lab Results  Component Value  Date   HGBA1C 6.4 (A) 09/28/2018    ASSESSMENT/PLAN:  DM (diabetes mellitus), type 2 with peripheral vascular complications (HCC) - controlled; only 0.1 drop in A1c with starting Metformin, but over holidays.  +wt loss. Cont 500mg  daily - Plan: HgB A1c  Class 3 severe obesity due to excess calories with serious comorbidity and body mass index (BMI) of 40.0 to 44.9 in adult [facility]) - counseled re: healthy diet, portions, exercise, weight loss, risks  Cough - Plan: albuterol (PROVENTIL) (2.5 MG/3ML) 0.083% nebulizer solution 2.5 mg  Asthmatic bronchitis with acute exacerbation, unspecified asthma severity, unspecified whether persistent - treat with doxycycline, albuterol; continue mucinex. f/u next week if not improving - Plan: doxycycline (VIBRA-TABS) 100 MG tablet, albuterol (PROVENTIL HFA;VENTOLIN HFA) 108 (90 Base) MCG/ACT inhaler  Elevated uric acid in blood - info given on low purine diet; has had no further toe pain, so will not recheck/treat at this time. Recheck at f/u visit when labs drawn  Foreign body sensation in ear canal, left  Impacted cerumen of left ear   F/u as scheduled in April.   Continue the metformin once  daily. Continue to limit carbs, sweets and sugar in your diet. If you are checking sugars, goal is to be under 120 (ideally <100) in the mornings, and under 140 when checked later in the day (at least 2 hours after a meal).  Let us know if you start to have recurrent toe pain.  Read the info on proper diet.  Medications may be in order (allopurinol) if you continue to have any issues with gout and high uric acid levels remain.  We will plan on checking the level again when you're next due for labs.

## 2018-09-27 NOTE — Patient Instructions (Addendum)
Take the antibiotic twice daily for ten days. Avoid the sun or use sunscreen to all exposed areas. Continue to use the mucinex twice daily. Use the albuterol inhaler every 6 hours if needed for wheezing or shortness of breath.  Return next week if your cough is persisting or worsening, rather than improving back to your baseline. Be sure to stay well hydrated to keep the mucus thin (the extra lasix may be contributing to some of the difficulty in expectorating the phlegm).   Continue the metformin once daily. Continue to limit carbs, sweets and sugar in your diet. If you are checking sugars, goal is to be under 120 (ideally <100) in the mornings, and under 140 when checked later in the day (at least 2 hours after a meal).  Let us know if you start to have recurrent toe pain.  Read the info on proper diet.  Medications may be in order (allopurinol) if you continue to have any issues with gout and high uric acid levels remain.  We will plan on checking the level again when you're next due for labs.     Low-Purine Eating Plan A low-purine eating plan involves making food choices to limit your intake of purine. Purine is a kind of uric acid. Too much uric acid in your blood can cause certain conditions, such as gout and kidney stones. Eating a low-purine diet can help control these conditions. What are tips for following this plan? Reading food labels   Avoid foods with saturated or Trans fat.  Check the ingredient list of grains-based foods, such as bread and cereal, to make sure that they contain whole grains.  Check the ingredient list of sauces or soups to make sure they do not contain meat or fish.  When choosing soft drinks, check the ingredient list to make sure they do not contain high-fructose corn syrup. Shopping  Buy plenty of fresh fruits and vegetables.  Avoid buying canned or fresh fish.  Buy dairy products labeled as low-fat or nonfat.  Avoid buying premade or  processed foods. These foods are often high in fat, salt (sodium), and added sugar. Cooking  Use olive oil instead of butter when cooking. Oils like olive oil, canola oil, and sunflower oil contain healthy fats. Meal planning  Learn which foods do or do not affect you. If you find out that a food tends to cause your gout symptoms to flare up, avoid eating that food. You can enjoy foods that do not cause problems. If you have any questions about a food item, talk with your dietitian or health care provider.  Limit foods high in fat, especially saturated fat. Fat makes it harder for your body to get rid of uric acid.  Choose foods that are lower in fat and are lean sources of protein. General guidelines  Limit alcohol intake to no more than 1 drink a day for nonpregnant women and 2 drinks a day for men. One drink equals 12 oz of beer, 5 oz of wine, or 1 oz of hard liquor. Alcohol can affect the way your body gets rid of uric acid.  Drink plenty of water to keep your urine clear or pale yellow. Fluids can help remove uric acid from your body.  If directed by your health care provider, take a vitamin C supplement.  Work with your health care provider and dietitian to develop a plan to achieve or maintain a healthy weight. Losing weight can help reduce uric acid in your blood.  What foods are recommended? The items listed may not be a complete list. Talk with your dietitian about what dietary choices are best for you. Foods low in purines Foods low in purines do not need to be limited. These include:  All fruits.  All low-purine vegetables, pickles, and olives.  Breads, pasta, rice, cornbread, and popcorn. Cake and other baked goods.  All dairy foods.  Eggs, nuts, and nut butters.  Spices and condiments, such as salt, herbs, and vinegar.  Plant oils, butter, and margarine.  Water, sugar-free soft drinks, tea, coffee, and cocoa.  Vegetable-based soups, broths, sauces, and  gravies. Foods moderate in purines Foods moderate in purines should be limited to the amounts listed.   cup of asparagus, cauliflower, spinach, mushrooms, or green peas, each day.  2/3 cup uncooked oatmeal, each day.   cup dry wheat bran or wheat germ, each day.  2-3 ounces of meat or poultry, each day.  4-6 ounces of shellfish, such as crab, lobster, oysters, or shrimp, each day.  1 cup cooked beans, peas, or lentils, each day.  Soup, broths, or bouillon made from meat or fish. Limit these foods as much as possible. What foods are not recommended? The items listed may not be a complete list. Talk with your dietitian about what dietary choices are best for you. Limit your intake of foods high in purines, including:  Beer and other alcohol.  Meat-based gravy or sauce.  Canned or fresh fish, such as: ? Anchovies, sardines, herring, and tuna. ? Mussels and scallops. ? Codfish, trout, and haddock.  Berniece Salines.  Organ meats, such as: ? Liver or kidney. ? Tripe. ? Sweetbreads (thymus gland or pancreas).  Wild Clinical biochemist.  Yeast or yeast extract supplements.  Drinks sweetened with high-fructose corn syrup. Summary  Eating a low-purine diet can help control conditions caused by too much uric acid in the body, such as gout or kidney stones.  Choose low-purine foods, limit alcohol, and limit foods high in fat.  You will learn over time which foods do or do not affect you. If you find out that a food tends to cause your gout symptoms to flare up, avoid eating that food. This information is not intended to replace advice given to you by your health care provider. Make sure you discuss any questions you have with your health care provider. Document Released: 12/28/2010 Document Revised: 10/16/2016 Document Reviewed: 10/16/2016 Elsevier Interactive Patient Education  2019 [employer].

## 2018-09-28 ENCOUNTER — Ambulatory Visit (INDEPENDENT_AMBULATORY_CARE_PROVIDER_SITE_OTHER): Payer: Medicare HMO | Admitting: Family Medicine

## 2018-09-28 ENCOUNTER — Encounter: Payer: Self-pay | Admitting: Family Medicine

## 2018-09-28 VITALS — BP 136/84 | HR 68 | Temp 98.5°F | Ht 67.5 in | Wt 273.8 lb

## 2018-09-28 DIAGNOSIS — E790 Hyperuricemia without signs of inflammatory arthritis and tophaceous disease: Secondary | ICD-10-CM | POA: Diagnosis not present

## 2018-09-28 DIAGNOSIS — R05 Cough: Secondary | ICD-10-CM

## 2018-09-28 DIAGNOSIS — H61892 Other specified disorders of left external ear: Secondary | ICD-10-CM

## 2018-09-28 DIAGNOSIS — H6122 Impacted cerumen, left ear: Secondary | ICD-10-CM

## 2018-09-28 DIAGNOSIS — R059 Cough, unspecified: Secondary | ICD-10-CM

## 2018-09-28 DIAGNOSIS — J45901 Unspecified asthma with (acute) exacerbation: Secondary | ICD-10-CM

## 2018-09-28 DIAGNOSIS — Z6841 Body Mass Index (BMI) 40.0 and over, adult: Secondary | ICD-10-CM

## 2018-09-28 DIAGNOSIS — E1151 Type 2 diabetes mellitus with diabetic peripheral angiopathy without gangrene: Secondary | ICD-10-CM

## 2018-09-28 DIAGNOSIS — R09A9 Foreign body sensation, other site: Secondary | ICD-10-CM

## 2018-09-28 LAB — POCT GLYCOSYLATED HEMOGLOBIN (HGB A1C): HEMOGLOBIN A1C: 6.4 % — AB (ref 4.0–5.6)

## 2018-09-28 MED ORDER — DOXYCYCLINE HYCLATE 100 MG PO TABS: 100.0000 mg | ORAL_TABLET | Freq: Two times a day (BID) | ORAL | 0 refills | Status: DC

## 2018-09-28 MED ORDER — ALBUTEROL SULFATE HFA 108 (90 BASE) MCG/ACT IN AERS: 2.0000 | INHALATION_SPRAY | Freq: Four times a day (QID) | RESPIRATORY_TRACT | 0 refills | Status: DC | PRN

## 2018-09-28 MED ORDER — ALBUTEROL SULFATE (2.5 MG/3ML) 0.083% IN NEBU
2.5000 mg | INHALATION_SOLUTION | Freq: Once | RESPIRATORY_TRACT | Status: AC
  Administered 2018-09-28: 2.5 mg via RESPIRATORY_TRACT

## 2018-10-19 DIAGNOSIS — R69 Illness, unspecified: Secondary | ICD-10-CM | POA: Diagnosis not present

## 2018-10-29 ENCOUNTER — Ambulatory Visit (INDEPENDENT_AMBULATORY_CARE_PROVIDER_SITE_OTHER): Payer: Medicare HMO | Admitting: Family Medicine

## 2018-10-29 ENCOUNTER — Encounter: Payer: Self-pay | Admitting: Family Medicine

## 2018-10-29 VITALS — BP 128/74 | HR 64 | Temp 98.3°F | Ht 68.0 in | Wt 275.2 lb

## 2018-10-29 DIAGNOSIS — R05 Cough: Secondary | ICD-10-CM | POA: Diagnosis not present

## 2018-10-29 DIAGNOSIS — R059 Cough, unspecified: Secondary | ICD-10-CM

## 2018-10-29 NOTE — Patient Instructions (Signed)
  Increase mucinex to twice daily. Continue the albuterol every 4-6 hours as needed for wheezing.  Your lungs were clear today, so the medication you took earlier seems to be working. Contact us if you develop high fevers or persistent/worsening cough and shortness of breath. Continue the delsym as needed.

## 2018-10-29 NOTE — Progress Notes (Signed)
Chief Complaint  Patient presents with  . Cough    and chest congestion, delsym helps after a while. Has been sweating all morning, so unsure if he has a fever or not. No chills or body aches. Mucus is still creamy in color. He states that he did get better from last visit on 09/28/2018 but started coming back about a week ago.    Within the last week he has had recurrent/worsening cough, wheezing. Denies ear pain, sore throat (was scratchy yesterday).  Denies sinus pain.  Mucus from nose is usually clear, this morning was more creamy.  He has chronic cough, and some tightening of his chest in the evenings (when he cannot get the phlegm up). While reading this morning, his wife could hear him "rattling and wheezing". Seemed similar to when he was last seen, and treated with antibiotics. He completed the 10 day course of doxycycline, got completely better until recently.  He has been using albuterol twice a day, restarted when the chest congestion restarted recently.  (didn't need it for a couple of weeks, since getting over his last infection). It does seem to help.  Also using Mucinex once daily in the evening, though also took it this morning; also taking Delsym at bedtime  Some sweats starting in the mornings at 6am, mostly across the chest Mucus is all cream-colored, no yellow-green.  No fever or body aches.  No known sick contacts. Limited contact with others except at the gym.   PMH, PSH, SH reviewed  Outpatient Encounter Medications as of 10/29/2018  Medication Sig Note  . albuterol (PROVENTIL HFA;VENTOLIN HFA) 108 (90 Base) MCG/ACT inhaler Inhale 2 puffs into the lungs every 6 (six) hours as needed for wheezing or shortness of breath.   Marland Kitchen atorvastatin (LIPITOR) 80 MG tablet TAKE 1 TABLET BY MOUTH EVERY EVENING   . CALCIUM-MAGNESIUM-ZINC PO Take 1 tablet by mouth at bedtime.    . cetirizine (ZYRTEC) 10 MG tablet Take 10 mg by mouth daily.   . Cholecalciferol (VITAMIN D) 2000 UNITS  tablet Take 4,000 Units by mouth at bedtime.    . Coenzyme Q10 (CO Q-10) 100 MG CAPS Take 100 mg by mouth at bedtime.    Marland Kitchen dextromethorphan (DELSYM) 30 MG/5ML liquid Take by mouth as needed for cough. 09/28/2018: Takes 1/2 dose at bedtime  . dofetilide (TIKOSYN) 500 MCG capsule TAKE ONE CAPSULE BY MOUTH TWICE A DAY   . ELIQUIS 5 MG TABS tablet TAKE 1 TABLET BY MOUTH TWICE A DAY   . fluticasone (FLONASE) 50 MCG/ACT nasal spray USE 2 SPRAYS NASALLY DAILY   . furosemide (LASIX) 40 MG tablet TAKE 1&1/2 TABLET BY MOUTH DAILY 09/28/2018: Takes 2 if his weight increases by a pound.   Marland Kitchen guaiFENesin (MUCINEX) 600 MG 12 hr tablet Take by mouth 2 (two) times daily. 09/28/2018: Using x 3 days, none yet taken today  . losartan (COZAAR) 100 MG tablet Take 1 tablet (100 mg total) by mouth daily.   . metFORMIN (GLUCOPHAGE-XR) 500 MG 24 hr tablet Take 1 tablet (500 mg total) by mouth daily with breakfast.   . metoprolol tartrate (LOPRESSOR) 25 MG tablet TAKE 1 TABLET BY MOUTH 2 TIMES A DAY   . Multiple Vitamin (MULTIVITAMIN WITH MINERALS) TABS tablet Take 1 tablet by mouth daily. Centrum silver   . omeprazole (PRILOSEC) 20 MG capsule Take 20 mg by mouth every other day.    . testosterone cypionate (DEPOTESTOTERONE CYPIONATE) 200 MG/ML injection Inject 200 mg into the muscle  every 14 (fourteen) days.  12/24/2017: Now taking 100mg  every week (1/2 ML qweek) per urologist  . acetaminophen (TYLENOL) 500 MG tablet Take 1,000 mg by mouth at bedtime as needed for mild pain.    Marland Kitchen amoxicillin (AMOXIL) 500 MG capsule TAKE 4 CAPSULES BY MOUTH 1 HOUR PRE DENTAL APPOINTMENT   . Diclofenac Sodium 3 % GEL APPLY UP TO TWICE DAILY AS DIRECTED 03/14/2016: Uses on his head, per dermatologist  . nitroGLYCERIN (NITROSTAT) 0.4 MG SL tablet Place 1 tablet (0.4 mg total) under the tongue every 5 (five) minutes as needed for chest pain. (Patient not taking: Reported on 09/28/2018)   . [DISCONTINUED] doxycycline (VIBRA-TABS) 100 MG tablet Take 1  tablet (100 mg total) by mouth 2 (two) times daily. (Patient not taking: Reported on 10/29/2018)    No facility-administered encounter medications on file as of 10/29/2018.    No Known Allergies  ROS:  No nausea, vomiting, diarrhea, skin rashes, bleeding, bruising. No longer has tickle/sensation in his left ear.  Audiologist cleaned out right ear yesterday. URI symptoms per HPI   PHYSICAL EXAM:  BP 128/74   Pulse 64   Temp 98.3 F (36.8 C) (Tympanic)   Ht 5\' 8"  (1.727 m)   Wt 275 lb 3.2 oz (124.8 kg)   BMI 41.84 kg/m   Well appearing, pleasant male, in no distress. No coughing during visit. Appears comfortable and is speaking easily in full sentences. HEENT: EOMI, conjunctiva clear. OP clear.hearing aids in place, TM's not evaluated. Nasal mucosa--normal, no erythema or purulence. +septal deviation, smaller passage on the right. Sinuses are nontender.  Neck: no lymphadenopathy, thyromegalyor mass Heart: Regularrate andrhythm, 2-3/6 murmur, loudest at RUSB Lungs: clear bilaterally. Good air movement, no wheezes, rales, ronchi Skin: normal turgor, no rash Psych: normal mood, affect, hygiene and grooming Neuro: alert and oriented, cranial nerves intact, normal gait.  ASSESSMENT/PLAN:  Cough - suspect related to URI and RAD. Lungs are clear, pt reassured. Cont albuterol prn and supportive measures reviewed    Increase mucinex to twice daily. Continue the albuterol every 4-6 hours as needed for wheezing.  Your lungs were clear today, so the medication you took earlier seems to be working. Contact us if you develop high fevers or persistent/worsening cough and shortness of breath. Continue the delsym as needed.

## 2018-11-19 ENCOUNTER — Other Ambulatory Visit: Payer: Self-pay | Admitting: Cardiology

## 2018-11-19 DIAGNOSIS — G4733 Obstructive sleep apnea (adult) (pediatric): Secondary | ICD-10-CM | POA: Diagnosis not present

## 2018-11-30 ENCOUNTER — Telehealth: Payer: Self-pay | Admitting: *Deleted

## 2018-11-30 NOTE — Telephone Encounter (Signed)
[facility] is out of losartan and so is [facility] in [place]. Will need to change patient to a different medications. Will forward to pharm md to help

## 2018-11-30 NOTE — Telephone Encounter (Signed)
Lets go with irbesartan 150 mg qd.  Check home BP and let us know if it changes significantly

## 2018-12-03 ENCOUNTER — Other Ambulatory Visit: Payer: Self-pay | Admitting: Cardiology

## 2018-12-04 MED ORDER — IRBESARTAN 150 MG PO TABS: 150.0000 mg | ORAL_TABLET | Freq: Every day | ORAL | 3 refills | Status: DC

## 2018-12-04 NOTE — Telephone Encounter (Signed)
New script sent to the pharmacy

## 2018-12-04 NOTE — Telephone Encounter (Signed)
[facility] for irbesartan [facility]

## 2018-12-04 NOTE — Telephone Encounter (Signed)
Will forward for dr Stanford Breed review, okay with irbesartan?

## 2018-12-21 ENCOUNTER — Other Ambulatory Visit: Payer: Self-pay | Admitting: Family Medicine

## 2018-12-21 DIAGNOSIS — E1151 Type 2 diabetes mellitus with diabetic peripheral angiopathy without gangrene: Secondary | ICD-10-CM

## 2018-12-27 ENCOUNTER — Other Ambulatory Visit: Payer: Self-pay | Admitting: Cardiology

## 2018-12-28 ENCOUNTER — Ambulatory Visit: Payer: Medicare HMO | Admitting: Family Medicine

## 2019-01-06 ENCOUNTER — Telehealth: Payer: Self-pay | Admitting: *Deleted

## 2019-01-06 NOTE — Telephone Encounter (Signed)
01/06/19 LMOM @ 2:17 pm, re: follow up appointment.

## 2019-01-13 NOTE — Telephone Encounter (Signed)
Left message for patient to call and schedule 6 mos telehealth visit with Dr. Crenshaw 

## 2019-01-23 ENCOUNTER — Other Ambulatory Visit: Payer: Self-pay | Admitting: Cardiology

## 2019-01-25 NOTE — Telephone Encounter (Signed)
Lopressor refilled. 

## 2019-01-29 ENCOUNTER — Other Ambulatory Visit: Payer: Self-pay | Admitting: Cardiology

## 2019-01-29 DIAGNOSIS — I4891 Unspecified atrial fibrillation: Secondary | ICD-10-CM

## 2019-02-15 DIAGNOSIS — G4733 Obstructive sleep apnea (adult) (pediatric): Secondary | ICD-10-CM | POA: Diagnosis not present

## 2019-02-20 ENCOUNTER — Other Ambulatory Visit: Payer: Self-pay | Admitting: Cardiology

## 2019-03-01 ENCOUNTER — Other Ambulatory Visit: Payer: Self-pay | Admitting: Cardiology

## 2019-03-01 DIAGNOSIS — R948 Abnormal results of function studies of other organs and systems: Secondary | ICD-10-CM | POA: Diagnosis not present

## 2019-03-01 DIAGNOSIS — E291 Testicular hypofunction: Secondary | ICD-10-CM | POA: Diagnosis not present

## 2019-03-04 ENCOUNTER — Other Ambulatory Visit: Payer: Self-pay | Admitting: Cardiology

## 2019-03-08 DIAGNOSIS — E291 Testicular hypofunction: Secondary | ICD-10-CM | POA: Diagnosis not present

## 2019-03-08 DIAGNOSIS — N5201 Erectile dysfunction due to arterial insufficiency: Secondary | ICD-10-CM | POA: Diagnosis not present

## 2019-03-14 ENCOUNTER — Other Ambulatory Visit: Payer: Self-pay | Admitting: Family Medicine

## 2019-03-14 DIAGNOSIS — Z7901 Long term (current) use of anticoagulants: Secondary | ICD-10-CM

## 2019-03-14 DIAGNOSIS — Z5181 Encounter for therapeutic drug level monitoring: Secondary | ICD-10-CM

## 2019-03-14 DIAGNOSIS — E7800 Pure hypercholesterolemia, unspecified: Secondary | ICD-10-CM

## 2019-03-14 DIAGNOSIS — E790 Hyperuricemia without signs of inflammatory arthritis and tophaceous disease: Secondary | ICD-10-CM

## 2019-03-14 DIAGNOSIS — E1151 Type 2 diabetes mellitus with diabetic peripheral angiopathy without gangrene: Secondary | ICD-10-CM

## 2019-03-16 ENCOUNTER — Other Ambulatory Visit: Payer: Self-pay | Admitting: Family Medicine

## 2019-03-16 DIAGNOSIS — E1151 Type 2 diabetes mellitus with diabetic peripheral angiopathy without gangrene: Secondary | ICD-10-CM

## 2019-03-16 NOTE — Telephone Encounter (Signed)
This was prescribed on 12/21/18. #90 pt has an appt on 7/6/ so he would have enough to appt

## 2019-03-18 ENCOUNTER — Other Ambulatory Visit: Payer: Self-pay

## 2019-03-18 ENCOUNTER — Other Ambulatory Visit: Payer: Medicare HMO

## 2019-03-18 DIAGNOSIS — Z7901 Long term (current) use of anticoagulants: Secondary | ICD-10-CM | POA: Diagnosis not present

## 2019-03-18 DIAGNOSIS — E7800 Pure hypercholesterolemia, unspecified: Secondary | ICD-10-CM

## 2019-03-18 DIAGNOSIS — E790 Hyperuricemia without signs of inflammatory arthritis and tophaceous disease: Secondary | ICD-10-CM

## 2019-03-18 DIAGNOSIS — E1151 Type 2 diabetes mellitus with diabetic peripheral angiopathy without gangrene: Secondary | ICD-10-CM

## 2019-03-18 DIAGNOSIS — Z5181 Encounter for therapeutic drug level monitoring: Secondary | ICD-10-CM

## 2019-03-19 LAB — COMPREHENSIVE METABOLIC PANEL
ALT: 47 IU/L — ABNORMAL HIGH (ref 0–44)
AST: 36 IU/L (ref 0–40)
Albumin/Globulin Ratio: 1.7 (ref 1.2–2.2)
Albumin: 4.2 g/dL (ref 3.7–4.7)
Alkaline Phosphatase: 46 IU/L (ref 39–117)
BUN/Creatinine Ratio: 12 (ref 10–24)
BUN: 13 mg/dL (ref 8–27)
Bilirubin Total: 0.7 mg/dL (ref 0.0–1.2)
CO2: 27 mmol/L (ref 20–29)
Calcium: 9.9 mg/dL (ref 8.6–10.2)
Chloride: 101 mmol/L (ref 96–106)
Creatinine, Ser: 1.09 mg/dL (ref 0.76–1.27)
GFR calc Af Amer: 75 mL/min/{1.73_m2} (ref >59)
GFR calc non Af Amer: 65 mL/min/{1.73_m2} (ref >59)
Globulin, Total: 2.5 g/dL (ref 1.5–4.5)
Glucose: 116 mg/dL — ABNORMAL HIGH (ref 65–99)
Potassium: 5.3 mmol/L — ABNORMAL HIGH (ref 3.5–5.2)
Sodium: 143 mmol/L (ref 134–144)
Total Protein: 6.7 g/dL (ref 6.0–8.5)

## 2019-03-19 LAB — CBC WITH DIFFERENTIAL/PLATELET
Basophils Absolute: 0 10*3/uL (ref 0.0–0.2)
Basos: 1 %
EOS (ABSOLUTE): 0.2 10*3/uL (ref 0.0–0.4)
Eos: 3 %
Hematocrit: 51.1 % — ABNORMAL HIGH (ref 37.5–51.0)
Hemoglobin: 16.4 g/dL (ref 13.0–17.7)
Immature Grans (Abs): 0 10*3/uL (ref 0.0–0.1)
Immature Granulocytes: 1 %
Lymphocytes Absolute: 1.2 10*3/uL (ref 0.7–3.1)
Lymphs: 17 %
MCH: 27.2 pg (ref 26.6–33.0)
MCHC: 32.1 g/dL (ref 31.5–35.7)
MCV: 85 fL (ref 79–97)
Monocytes Absolute: 0.7 10*3/uL (ref 0.1–0.9)
Monocytes: 10 %
Neutrophils Absolute: 4.8 10*3/uL (ref 1.4–7.0)
Neutrophils: 68 %
Platelets: 190 10*3/uL (ref 150–450)
RBC: 6.02 x10E6/uL — ABNORMAL HIGH (ref 4.14–5.80)
RDW: 17.5 % — ABNORMAL HIGH (ref 11.6–15.4)
WBC: 6.9 10*3/uL (ref 3.4–10.8)

## 2019-03-19 LAB — LIPID PANEL
Chol/HDL Ratio: 3.5 ratio (ref 0.0–5.0)
Cholesterol, Total: 133 mg/dL (ref 100–199)
HDL: 38 mg/dL — ABNORMAL LOW (ref >39)
LDL Calculated: 57 mg/dL (ref 0–99)
Triglycerides: 191 mg/dL — ABNORMAL HIGH (ref 0–149)
VLDL Cholesterol Cal: 38 mg/dL (ref 5–40)

## 2019-03-19 LAB — MICROALBUMIN / CREATININE URINE RATIO
Creatinine, Urine: 95.2 mg/dL
Microalb/Creat Ratio: 19 mg/g creat (ref 0–29)
Microalbumin, Urine: 18.2 ug/mL

## 2019-03-19 LAB — TSH: TSH: 2.25 u[IU]/mL (ref 0.450–4.500)

## 2019-03-19 LAB — HEMOGLOBIN A1C
Est. average glucose Bld gHb Est-mCnc: 151 mg/dL
Hgb A1c MFr Bld: 6.9 % — ABNORMAL HIGH (ref 4.8–5.6)

## 2019-03-19 LAB — URIC ACID: Uric Acid: 8 mg/dL (ref 3.7–8.6)

## 2019-03-20 ENCOUNTER — Other Ambulatory Visit: Payer: Self-pay | Admitting: Cardiology

## 2019-03-21 NOTE — Patient Instructions (Addendum)
HEALTH MAINTENANCE RECOMMENDATIONS:  It is recommended that you get at least 30 minutes of aerobic exercise at least 5 days/week (for weight loss, you may need as much as 60-90 minutes). This can be any activity that gets your heart rate up. This can be divided in 10-15 minute intervals if needed, but try and build up your endurance at least once a week.  Weight bearing exercise is also recommended twice weekly.  Eat a healthy diet with lots of vegetables, fruits and fiber.  "Colorful" foods have a lot of vitamins (ie green vegetables, tomatoes, red peppers, etc).  Limit sweet tea, regular sodas and alcoholic beverages, all of which has a lot of calories and sugar.  Up to 2 alcoholic drinks daily may be beneficial for men (unless trying to lose weight, watch sugars).  Drink a lot of water.  Sunscreen of at least SPF 30 should be used on all sun-exposed parts of the skin when outside between the hours of 10 am and 4 pm (not just when at beach or pool, but even with exercise, golf, tennis, and yard work!)  Use a sunscreen that says "broad spectrum" so it covers both UVA and UVB rays, and make sure to reapply every 1-2 hours.  Remember to change the batteries in your smoke detectors when changing your clock times in the spring and fall. Carbon detectors are recommended for your home.  Use your seat belt every time you are in a car, and please drive safely and not be distracted with cell phones and texting while driving.    Jeffrey Wiggins , Thank you for taking time to come for your Medicare Wellness Visit. I appreciate your ongoing commitment to your health goals. Please review the following plan we discussed and let me know if I can assist you in the future.    This is a list of the screening recommended for you and due dates:  Health Maintenance  Topic Date Due  . Complete foot exam   12/25/2018  . Flu Shot  04/17/2019  . Eye exam for diabetics  07/22/2019  . Hemoglobin A1C  09/18/2019  .  Colon Cancer Screening  02/01/2020  . Tetanus Vaccine  03/16/2022  . Pneumonia vaccines  Completed   You are due for your diabetic foot exam.  We will do this at your next in-office visit. Please be sure to check your feet regularly, and schedule appointment with [place] or podiatrist if any lesions/sores or pain develop.  Continue yearly high dose flu shots in the Fall.  I recommend getting the new shingles vaccine (Shingrix). This should be covered by Medicare Part D, so you need to get it from the pharmacy rather than our office.  It is a series of 2 injections, spaced 2 months apart.  Increase your Metformin ER to taking TWO pills every morning with breakfast. Cut back on sweets and increase your exercise (do more at home, even if just 10-15 minute intervals). Please try and periodically check your blood sugars ( a few times a week, if not daily).  Sometimes check in the morning (before eating), and sometimes check before bedtime (at least 2 hours after eating).  Feel free to email/fax/mail your sugar results for my review.  Goals--<140 (ideally <120) for fasting sugars, and <160 for evening sugars.  Bring your lists of blood sugars and blood pressures to your next visit.  Stop taking the potassium/Mg/Zinc supplement--your potassium was elevated.  Consider getting a separate magnesium supplement.  I highly  recommend you going to [facility] (Dr. Adair Patter, Elita Boone, etc).  Check with their office to see what their waiting list is like, and when they may resume in-office visits. Getting the weight down will help with your breathing, lower need for medications (for diabetes, blood pressure), help with knee pain, etc.

## 2019-03-21 NOTE — Progress Notes (Signed)
Start time: 3:18 switched to audio only at 3:25, due to video freezing and clipping of sound. End time: 4:30  Virtual Visit via Video Note  I connected with Jeffrey Wiggins on 03/22/2019 by a video enabled telemedicine application and verified that I am speaking with the correct person using two identifiers.  Location: Patient: at home, wife is present Provider: office   I discussed the limitations of evaluation and management by telemedicine and the availability of in person appointments. The patient expressed understanding and agreed to proceed. He consents to insurance being filed for this visit.  History of Present Illness:  Chief Complaint  Patient presents with  . Medicare Wellness    VIRTUAL AWV, no concerns. Not taking irbesartan, taking losartan 100mg .     Patient presents for Medicare Annual Wellness visit and follow-up on chronic problems. He reports he has something on his knee, thinks it might be a wart, asking how to send a photo.  He was last seen in January, at which time he was treated with doxycycline and albuterol for asthmatic bronchitis. His breathing is "1000% better", at his baseline.  Still has some shortness of breath with activity.  No longer coughing much, very rare, related to PND. He is able to get up some phlegm 2-3 times/day and it is clear.  Diabetes:  Diabetes was diagnosed 05/2013 based on A1c of 6.7.He had been controlled just with diet (only took metformin for a short time initially), until 06/2018, at which time his A1c was back up to 6.5, and Metformin was started. He is tolerating Metformin with side effects.  He hasn't checked his sugars (wife has diabetes, checks regularly, but he doesn't check his).  Admits to snacking on more chocolate in the last couple of months. Normal meals are low in carbs. Wife continues to bake (made blackberry cobbler last week). His usual exercise routine is going to the fitness center, getting exercise4-5x/week, 30  minutes on exercise bike plus weights. He was unable to do this related to the coronavirus pandemic--He only recently has been able to go to the fitness center 30 minutes twice a week. No polydipsia, polyuria, hypoglycemia, numbness, tingling, vision or feet concerns. Last eye exam was 07/2018, no retinopathy.  He has elevated uric acid levels, and previously reported having R great toe pain when he ate shrimp.  He cut back on the frequency of eating shrimp. He only rarely has some toe pain (usually on the right), but it is shortlived and rare.  He was given information on low purine diet at his last visit (09/2018).  Obesity:exercises regularly (less in the last few months, as above), eats small portions, healthy foods. 1 glass of wine 2-3/week. Unsweet tea when he goes out to eat. Mostly drinks water. Some snacks and baked goods as above.  He was previously encouraged to consider the [facility] through [facility] at his October 2019 visit.  GERD--controlled with meds.Compliant with taking prilosec daily, and denies heartburn.Denies dysphagia.He has recurrent symptoms of heartburn if he doesn't take it. Some chronic cough as above-- gets up clear-white mucus a few times/day--chronic and unchanged.   OSA-- compliant with use of CPAP.  Reports feeling refreshed in the mornings, denies daytime somnolence.  (sometimes gets sleepy at 3pm, not always).  Hyperlipidemia: He reports compliance with taking 80mg  lipitor, denies side effects. Is reportedly following a lowfat, low cholesterol diet. He does not take any fish oil, has never tried it.  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 He no longersees Dr. Leonie Man. Goalsrecommended by neuro:BP <130/90, hemoglobin A1c goal below 6.5% and lipids with LDL cholesterol goal below 70 mg/dL.  He remains under the care of Dr. Stanford Breed for atrial  fibrillation, HTN, CAD, diastolic heart failure. He was last seen in September. HTN:  He is compliant with Losartan and Lopressor. BP's at home are running 130's (up to 145)/70-85 (which is higher than as reported at prior visits). He denies headaches, dizziness, chest pain, palpitations. Afib--he remains on Eliquis without any bleeding or complications.  Doing well on Tikosyn, denies palpitations. Rate is controlled on his current regimen. CAD: S/p CABG; stable. Not on aspirin due to being on eliquis. He continues to have some breathlessnesswith exertion, unchanged from when last discussed with his cardiologist and studies.No SOB at rest.Finds himself breathing hard justwalking through the house. Cardiologist previously mentioned thinking that some of his dyspnea may be secondary to history of obstructive sleep apnea, obesity and COPD.  Diastolic heart failure--on lasix per cardiologist.He takes lasix1.5 tabletsdaily. He weighs himself daily,takes 2tabletsif weight is up over 2# (weighs daily), which is infrequent.. Carotid artery stenosis--last [place] was 09/2016, and showed 1-39% blockage, no f/u needed per Dr. Stanford Breed  Hypogonadismand BPH: under the care ofurologist, Dr. Louis Meckel. Hecontinueson testosterone injections, weekly, dose reduced to 0.5 mL every 8 days; last level was 910 in 08/2018.Pt states he saw urolgoist 6/22, had additional labs (not yet received here), and he reports that testosterone level was 855, PSA 3.18 and Hgb 15.7. (he has h/o elevated Hgb, requiring therapeutic phlebotomy in 11/2016).  He is s/pTURP.Denies any urinary retention.Up once a night to void.He continues to have ED, using Trimix injection for treatment.   Immunization History  Administered Date(s) Administered  . Influenza Split 07/07/2012, 06/18/2013, 07/18/2015  . Influenza, High Dose Seasonal PF 06/14/2014, 06/16/2016, 06/29/2018  . Pneumococcal Conjugate-13 08/18/2014  . Pneumococcal  Polysaccharide-23 08/20/2007, 12/24/2017  . Td 08/16/2002  . Tdap 03/16/2012  . Zoster 12/04/2012   Last colonoscopy: 01/2015 with Dr. Penelope Coop; +tubular adenoma Last PSA: 08/2018, monitored q 28mos by urologist (due to testosterone treatment)  UPDATE Dentist: twice yearly  Ophtho: yearly, UTD Exercise: Twice a week x 30 minutes during the pandemic. Previous routine had been going to gym 3-5 times/week, 15 minutes on recumbent bike, then strength training   Other doctors caring for patient include: Dr. Allena Earing Dr. Rogers Seeds Dr. Herrick--urology Dr. Penelope Coop: GI Dr. Merced[facility] [address]--Dentist [facility].  Depression screen:  negative Fall screen: none in the last year Functional status survey: has hearing aids, double vision after reading (short-term), slight memory issues Mini-Cog screen: 3/3 word recall--clock not visualized through video, as the video froze; verified by wife that numbers were in proper location and looked like normal clock face with correct time.   End of Life Discussion:  Patient has a living will and medical power of attorney. He is Full Code, Full Care  Past Medical History:  Diagnosis Date  . Adenomatous colon polyp 01/2015   Dr. Penelope Coop  . Allergy    RHINITIS  . Arthritis   . Atrial fibrillation ([place])   . Benign prostatic hypertrophy    followed by Dr. Gaynelle Arabian; regrowth R lat lob (11/2015)  . CAD (coronary artery disease)   . Cataracts, bilateral   . Cerebrovascular disease   . Diverticular disease   . Diverticulosis    seen on colonoscopy (9/05, 06/2009)  .  Erectile dysfunction   . Family history of adverse reaction to anesthesia    mother had memeory problems after anesthesia with hip replacement, lasted until she died 2 to 3 years later  . Gallstones   . GERD (gastroesophageal reflux disease)   . 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 Hepatitis age 86   "called yellow jaundice"  . HTN  (hypertension)   . Hyperlipidemia   . Hyperplastic colon polyp 05/2004   tubular adenoma 01/2015  . Hypogonadism male    treated by Dr. Gaynelle Arabian  . Myocardial infarction [facility]) '98, '09   (Dr. Stanford Breed); s/p stents  . Nocturia   . Obesity   . Peripheral vascular disease ([place])    02/28/12 MOST RECENT CAROTID DUPLEX--"STABLE MILD CALCIFIED PLAQUE, BILATERALY, STABLE, OVER SERIAL EXAMS"  . Pneumonia age 80   hx of  . Prostatitis   . 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 Sepsis due to Escherichia coli [facility]) ? 2011   DUE TO URINARY OUTLET OBSTRUCTION /INFECTION  . Shortness of breath dyspnea    with exertion only  . Sleep apnea    mild, CPAP wasn't indicated (per pt)--2014 mod-severe, on CPAP since 07/2013  . Stroke [facility]) jan 2016  . UTI (urinary tract infection)    secondary  . Visit for monitoring Tikosyn therapy     Past Surgical History:  Procedure Laterality Date  . CORONARY ANGIOPLASTY WITH STENT PLACEMENT  01/1997, stent x 1, 2009 2nd stent in same location   PTCA/stent to RCA  AND 2009 2ND STENT PLACED  . CORONARY ARTERY BYPASS GRAFT N/A 04/15/2014   Procedure: CORONARY ARTERY BYPASS GRAFTING TIMES TWO USEING LEFT INTERNAL MAMMARY ARTERY AND RIGHT GREATER SAPHENOUS VEIN VIA ENDOVEIN HARVEST;  Surgeon: Rexene Alberts, MD;  Location: [place];  Service: Open Heart Surgery;  Laterality: N/A;  . CYSTOSCOPY  11/17/15   [facility]  . GREEN LIGHT LASER TURP (TRANSURETHRAL RESECTION OF PROSTATE N/A 01/15/2016   Procedure: GREEN LIGHT LASER TURP (TRANSURETHRAL RESECTION OF PROSTATE;  Surgeon: Carolan Clines, MD;  Location: [facility];  Service: Urology;  Laterality: N/A;  LASER RIGHT LATERAL LOBE OF PROSTATE AND LEFT BLADDER NECK    . INTRAOPERATIVE TRANSESOPHAGEAL ECHOCARDIOGRAM N/A 04/15/2014   Procedure: INTRAOPERATIVE TRANSESOPHAGEAL ECHOCARDIOGRAM;  Surgeon: Rexene Alberts, MD;  Location: [place];  Service: Open Heart Surgery;   Laterality: N/A;  . JOINT REPLACEMENT     both knees  . KNEE SURGERY  05/2010   removing scar tissue R knee (Dr. Wynelle Link)  . LEFT AND RIGHT HEART CATHETERIZATION WITH CORONARY ANGIOGRAM  04/13/2014   Procedure: LEFT AND RIGHT HEART CATHETERIZATION WITH CORONARY ANGIOGRAM;  Surgeon: Sinclair Grooms, MD;  [facility];  Service: Cardiovascular;;  . TONSILLECTOMY    . TOTAL KNEE ARTHROPLASTY  L 4/06, R 4/07  . TOTAL KNEE REVISION  09/23/2012   Procedure: TOTAL KNEE REVISION;  Surgeon: Gearlean Alf, MD; right side  Location: [facility];  Service: Orthopedics;  Laterality: Right;  . TRANSURETHRAL RESECTION OF PROSTATE  06/2010  . VASECTOMY      Social History   Socioeconomic History  . Marital status: Married    Spouse name: Not on file  . Number of children: 4  . Years of education: Not on file  . Highest education level: Not on file  Occupational History  .  Occupation: Engineer, maintenance (IT)  Social Needs  . Financial resource strain: Not on file  . Food insecurity    Worry: Not on file    Inability: Not on file  . Transportation needs    Medical: Not on file    Non-medical: Not on file  Tobacco Use  . Smoking status: Former Smoker    Packs/day: 1.50    Years: 30.00    Pack years: 45.00    Types: Cigarettes    Quit date: 09/16/1986    Years since quitting: 32.5  . Smokeless tobacco: Never Used  . Tobacco comment: smoked 1-3 PPD from age 32 to 56  Substance and Sexual Activity  . Alcohol use: Yes    Alcohol/week: 1.0 standard drinks    Types: 1 Glasses of wine per week    Comment: 1 glass of wine 2-3 times a week  . Drug use: No  . Sexual activity: Not Currently  Lifestyle  . Physical activity    Days per week: Not on file    Minutes per session: Not on file  . Stress: Not on file  Relationships  . Social Herbalist on phone: Not on file    Gets together: Not on file    Attends religious service: Not on file    Active member of club or organization: Not on file     Attends meetings of clubs or organizations: Not on file    Relationship status: Not on file  . Intimate partner violence    Fear of current or ex partner: Not on file    Emotionally abused: Not on file    Physically abused: Not on file    Forced sexual activity: Not on file  Other Topics Concern  . Not on file  Social History Narrative   Lives with wife.  4 kids, 1 stepchild (1 in [place], 4 in [place]). 11 grandchildren, 2 great grandchild   No pets. Wife is a diabetic.   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 Coronary artery disease Mother        deceased  . Dementia Mother   . Atrial fibrillation Mother   . Stroke Mother        related to atrial fib  . Coronary artery disease Father        deceased  . Hypertension Father   . Heart disease Father   . Hypertension Paternal Grandmother   . Heart disease Paternal Grandmother   . Hypertension Paternal Grandfather   . Heart disease Paternal Grandfather   . Hypothyroidism Daughter        congenital  . Cancer Paternal Uncle        colon; died in his 72's  . Colon cancer Paternal Uncle   . Diabetes Neg Hx     Outpatient Encounter Medications as of 03/22/2019  Medication Sig Note  . acetaminophen (TYLENOL) 500 MG tablet Take 1,000 mg by mouth at bedtime as needed for mild pain.    Marland Kitchen atorvastatin (LIPITOR) 80 MG tablet Take 1 tablet (80 mg total) by mouth every evening. KEEP SCHEDULED APPOINTMENT   . CALCIUM-MAGNESIUM-ZINC PO Take 1 tablet by mouth at bedtime.    . cetirizine (ZYRTEC) 10 MG tablet Take 10 mg by mouth daily.   . Cholecalciferol (VITAMIN D) 2000 UNITS tablet Take 4,000 Units by mouth at bedtime.    . Coenzyme Q10 (CO  Q-10) 100 MG CAPS Take 100 mg by mouth at bedtime.    . Diclofenac Sodium 3 % GEL APPLY UP TO TWICE DAILY AS DIRECTED 03/14/2016: Uses on his head, per dermatologist  . dofetilide (TIKOSYN) 500 MCG capsule TAKE ONE CAPSULE BY  MOUTH TWICE A DAY   . ELIQUIS 5 MG TABS tablet TAKE 1 TABLET BY MOUTH TWICE A DAY   . fluticasone (FLONASE) 50 MCG/ACT nasal spray USE 2 SPRAYS NASALLY DAILY   . furosemide (LASIX) 40 MG tablet TAKE 1&1/2 TABLET BY MOUTH DAILY   . losartan (COZAAR) 100 MG tablet Take 100 mg by mouth daily.   . metFORMIN (GLUCOPHAGE-XR) 500 MG 24 hr tablet TAKE 1 TABLET BY MOUTH EVERY DAY WITH BREAKFAST   . metoprolol tartrate (LOPRESSOR) 25 MG tablet Take 1 tablet (25 mg total) by mouth 2 (two) times daily. Keep August Appointment   . Multiple Vitamin (MULTIVITAMIN WITH MINERALS) TABS tablet Take 1 tablet by mouth daily. Centrum silver   . omeprazole (PRILOSEC) 20 MG capsule Take 20 mg by mouth every other day.    . testosterone cypionate (DEPOTESTOTERONE CYPIONATE) 200 MG/ML injection Inject 200 mg into the muscle every 14 (fourteen) days.  12/24/2017: Now taking 100mg  every week (1/2 ML qweek) per urologist  . [DISCONTINUED] irbesartan (AVAPRO) 150 MG tablet Take 1 tablet (150 mg total) by mouth daily.   Marland Kitchen albuterol (PROVENTIL HFA;VENTOLIN HFA) 108 (90 Base) MCG/ACT inhaler Inhale 2 puffs into the lungs every 6 (six) hours as needed for wheezing or shortness of breath. (Patient not taking: Reported on 03/22/2019)   . amoxicillin (AMOXIL) 500 MG capsule TAKE 4 CAPSULES BY MOUTH 1 HOUR PRE DENTAL APPOINTMENT   . nitroGLYCERIN (NITROSTAT) 0.4 MG SL tablet Place 1 tablet (0.4 mg total) under the tongue every 5 (five) minutes as needed for chest pain. (Patient not taking: Reported on 09/28/2018)   . [DISCONTINUED] dextromethorphan (DELSYM) 30 MG/5ML liquid Take by mouth as needed for cough. 09/28/2018: Takes 1/2 dose at bedtime  . [DISCONTINUED] guaiFENesin (MUCINEX) 600 MG 12 hr tablet Take by mouth 2 (two) times daily. 09/28/2018: Using x 3 days, none yet taken today  . [DISCONTINUED] metoprolol tartrate (LOPRESSOR) 25 MG tablet TAKE 1 TABLET BY MOUTH TWICE A DAY    No facility-administered encounter medications on file  as of 03/22/2019.     No Known Allergies   ROS: The patient denies anorexia, fever, headaches, ear pain, hoarseness, chest pain, palpitations, syncope,; denies nausea, vomiting, diarrhea, constipation, abdominal pain, melena, hematochezia, hematuria, incontinence, dysuria, numbness, tingling, weakness, tremor, suspicious skin lesions, depression, anxiety, abnormal bleeding/bruising, or enlarged lymph nodes.  +allergies/PND Occasional swelling in feet and ankles, some days more than others. +hearing loss, wears hearing aids Double vision after reading for a long time, short-lived Some bilateral knee pain/stiffness, unchanged.  No giving way or swelling.  Worse in the winter. +ED, treated with injections per urologist. He has a spot on the side of his knee, may be a wart, he will try and send a photo   Observations/Objective:  BP 132/70   Pulse 69   Ht 5\' 8"  (1.727 m)   Wt 279 lb (126.6 kg)   BMI 42.42 kg/m    Wt Readings from Last 3 Encounters:  10/29/18 275 lb 3.2 oz (124.8 kg)  09/28/18 273 lb 12.8 oz (124.2 kg)  06/29/18 276 lb 9.6 oz (125.5 kg)   Patient is alert, oriented, and in good spirits (except when he got upset that his gym/fitness  center is not completely open as he would like, when [employer] can be so crowded). Cranial nerves were grossly intact, conjunctiva were clear, on the brief video portion that was interrupted by frequent freezing and pauses.  Remainder of exam was limited due to virtual nature of the visit.  Lab Results  Component Value Date   HGBA1C 6.9 (H) 03/18/2019   Fasting glucose 119   Chemistry      Component Value Date/Time   NA 143 03/18/2019 0815   K 5.3 (H) 03/18/2019 0815   CL 101 03/18/2019 0815   CO2 27 03/18/2019 0815   BUN 13 03/18/2019 0815   CREATININE 1.09 03/18/2019 0815   CREATININE 1.00 09/19/2016 1117      Component Value Date/Time   CALCIUM 9.9 03/18/2019 0815   ALKPHOS 46 03/18/2019 0815   AST 36 03/18/2019 0815   ALT  47 (H) 03/18/2019 0815   BILITOT 0.7 03/18/2019 0815     Urine microalbumin/Cr ratio 19  Lab Results  Component Value Date   LABURIC 8.0 03/18/2019   Lab Results  Component Value Date   WBC 6.9 03/18/2019   HGB 16.4 03/18/2019   HCT 51.1 (H) 03/18/2019   MCV 85 03/18/2019   PLT 190 03/18/2019   Lab Results  Component Value Date   TSH 2.250 03/18/2019   Lab Results  Component Value Date   CHOL 133 03/18/2019   HDL 38 (L) 03/18/2019   LDLCALC 57 03/18/2019   TRIG 191 (H) 03/18/2019   CHOLHDL 3.5 03/18/2019     Assessment and Plan:  Medicare annual wellness visit, subsequent   DM (diabetes mellitus), type 2 with peripheral vascular complications ([place]) - W1U increased to 6.9; increase metformin to 1000mg  to try and get A1c <6.5. Counseled re: diet, exercise and encouraged to check sugars - Plan: metFORMIN (GLUCOPHAGE-XR) 500 MG 24 hr tablet  Class 3 severe obesity due to excess calories with serious comorbidity and body mass index (BMI) of 40.0 to 44.9 in adult [facility]) - counseled extensively re: healthy diet, exercise, wt loss options. Cut back ETOH, encouraged Healthy Wt and Wt Loss clinic  Obstructive sleep apnea - compliant with CPAP, continue  Gastroesophageal reflux disease, esophagitis presence not specified - reviewed risks vs benefits of treatment. Cont 20mg  omeprazole. Wt loss encouraged. May cont Mg supplement, but stop potassium  Paroxysmal atrial fibrillation ([place]) - controlled (asymptomatic, rate controlled, on anticoagulant) per cardiologist  Mixed hyperlipidemia - TG remains elevated, HDL a little low. Encouraged lowfat diet. Recommended trying omega-3 fish oil   S/P CABG x 2   Elevated uric acid in blood - given lack of gout symptoms, not treating at this time  Long term current use of anticoagulant  Essential hypertension - okay today, reportedly a little higher at home. 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  Asked him to stop potassium supplement he is taking    Stop the Potassium Mg Zinc supplement Try taking omega-3 fish oil 1000-2000mg  daily to help lower triglycerides.  Would be interested in weight loss clinic, but not when virtual. Will check with their clinic to see about waiting list, and will in-office  patients will be seen.  Increase metformin (goal A1c <6.5) Mild hyperkalemia noted-- Send copies of labs to Dr. Stanford Breed  Elevated uric acid--asymptomatic   F/u 3 months In office with diabetic foot exam at that visit. A1c at the visit  PSA screening and prostate exams are done by his urologist; recommended at least 30 minutes of aerobic activity at least 5 days/week, weight-bearing exercise at least 2x/wk; proper sunscreen use reviewed; healthy diet and alcohol recommendations (less than or equal to 2 drinks/day) reviewed; regular seatbelt use; changing batteries in smoke detectors. Immunization recommendations discussed--continue high dose flu shots yearly. Shingrix recommended, risks/side effects reviewed, to get from pharmacy. Colonoscopy recommendations reviewed--UTD, due next year.  Counseled extensively re: diet, exercise, obesity and need for wt loss.  [facility]'s last note was scanned in as 08/2019--needs to be fixed in Epic (was 08/2018)  Patient was instructed on how to use MyChart, and how to attach photos (he was trying to do through his computer, photos are on his phone--advised him of app for phone, and how to attach photo to message.    Follow Up Instructions:  AVS to be printed and mailed, per patient  request.   I discussed the assessment and treatment plan with the patient. The patient was provided an opportunity to ask questions and all were answered. The patient agreed with the plan and demonstrated an understanding of the instructions.   The patient was advised to call back or seek an in-person evaluation if the symptoms worsen or if the condition fails to improve as anticipated.  I provided >60 minutes of non-face-to-face time during this encounter.   [name], MD  Medicare Attestation I have personally reviewed: The patient's medical and social history Their use of alcohol, tobacco or illicit drugs Their current medications and supplements The patient's functional ability including ADLs,fall risks, home safety risks, cognitive, and hearing and visual impairment Diet and physical activities Evidence for depression or mood disorders  The patient's weight, height and BMI have been recorded in the chart.  I have made referrals, counseling, and provided education to the patient based on review of the above and I have provided the patient with a written personalized care plan for preventive services.

## 2019-03-22 ENCOUNTER — Encounter: Payer: Self-pay | Admitting: Family Medicine

## 2019-03-22 ENCOUNTER — Ambulatory Visit (INDEPENDENT_AMBULATORY_CARE_PROVIDER_SITE_OTHER): Payer: Medicare HMO | Admitting: Family Medicine

## 2019-03-22 ENCOUNTER — Other Ambulatory Visit: Payer: Self-pay

## 2019-03-22 VITALS — BP 132/70 | HR 69 | Ht 68.0 in | Wt 279.0 lb

## 2019-03-22 DIAGNOSIS — E1151 Type 2 diabetes mellitus with diabetic peripheral angiopathy without gangrene: Secondary | ICD-10-CM | POA: Diagnosis not present

## 2019-03-22 DIAGNOSIS — E782 Mixed hyperlipidemia: Secondary | ICD-10-CM

## 2019-03-22 DIAGNOSIS — I10 Essential (primary) hypertension: Secondary | ICD-10-CM

## 2019-03-22 DIAGNOSIS — Z7901 Long term (current) use of anticoagulants: Secondary | ICD-10-CM

## 2019-03-22 DIAGNOSIS — I2510 Atherosclerotic heart disease of native coronary artery without angina pectoris: Secondary | ICD-10-CM

## 2019-03-22 DIAGNOSIS — Z0000 Encounter for general adult medical examination without abnormal findings: Secondary | ICD-10-CM

## 2019-03-22 DIAGNOSIS — Z8673 Personal history of transient ischemic attack (TIA), and cerebral infarction without residual deficits: Secondary | ICD-10-CM

## 2019-03-22 DIAGNOSIS — E875 Hyperkalemia: Secondary | ICD-10-CM

## 2019-03-22 DIAGNOSIS — E790 Hyperuricemia without signs of inflammatory arthritis and tophaceous disease: Secondary | ICD-10-CM

## 2019-03-22 DIAGNOSIS — Z951 Presence of aortocoronary bypass graft: Secondary | ICD-10-CM | POA: Diagnosis not present

## 2019-03-22 DIAGNOSIS — G4733 Obstructive sleep apnea (adult) (pediatric): Secondary | ICD-10-CM | POA: Diagnosis not present

## 2019-03-22 DIAGNOSIS — Z6841 Body Mass Index (BMI) 40.0 and over, adult: Secondary | ICD-10-CM

## 2019-03-22 DIAGNOSIS — K219 Gastro-esophageal reflux disease without esophagitis: Secondary | ICD-10-CM

## 2019-03-22 DIAGNOSIS — E291 Testicular hypofunction: Secondary | ICD-10-CM

## 2019-03-22 DIAGNOSIS — I480 Paroxysmal atrial fibrillation: Secondary | ICD-10-CM

## 2019-03-22 MED ORDER — METFORMIN HCL ER 500 MG PO TB24: 1000.0000 mg | ORAL_TABLET | Freq: Every day | ORAL | 1 refills | Status: DC

## 2019-04-09 ENCOUNTER — Encounter: Payer: Self-pay | Admitting: *Deleted

## 2019-04-14 ENCOUNTER — Other Ambulatory Visit: Payer: Self-pay | Admitting: Cardiology

## 2019-04-22 ENCOUNTER — Ambulatory Visit: Payer: Medicare HMO | Admitting: Cardiology

## 2019-04-30 ENCOUNTER — Other Ambulatory Visit: Payer: Self-pay | Admitting: Cardiology

## 2019-05-17 ENCOUNTER — Encounter: Payer: Self-pay | Admitting: *Deleted

## 2019-05-18 DIAGNOSIS — R69 Illness, unspecified: Secondary | ICD-10-CM | POA: Diagnosis not present

## 2019-05-26 ENCOUNTER — Other Ambulatory Visit: Payer: Self-pay | Admitting: Cardiology

## 2019-05-27 ENCOUNTER — Other Ambulatory Visit: Payer: Self-pay | Admitting: Cardiology

## 2019-05-30 ENCOUNTER — Other Ambulatory Visit: Payer: Self-pay | Admitting: Cardiology

## 2019-06-03 DIAGNOSIS — G4733 Obstructive sleep apnea (adult) (pediatric): Secondary | ICD-10-CM | POA: Diagnosis not present

## 2019-07-03 DIAGNOSIS — G4733 Obstructive sleep apnea (adult) (pediatric): Secondary | ICD-10-CM | POA: Diagnosis not present

## 2019-07-15 NOTE — Progress Notes (Signed)
HPI: FU coronary artery disease, PAF,hypertension, and hyperlipidemia. Cath 7/15 with severe CAD and EF 50. He underwent bypass grafting x2 with LIMA to the LAD and vein graft to the first obtuse marginal branch of the left circumflex. Abd ultrasound 9/15 showed no aneurysm. Also with h/o aflutter, PAF and CVA. Nuclear study 1/18 showed EF 38, inferior MI and mild peri-infarct ischemia. Carotid dopplers 1/18 showed 1-39 bilateral stenosis.  Last echocardiogram October 2018 showed normal LV function, moderate diastolic dysfunction, mild aortic stenosis with mean gradient 10 mmHg and trace aortic insufficiency.  There was mild tricuspid regurgitation and moderate pulmonary hypertension.  Since last seen,he has chronic dyspnea on exertion.  No orthopnea or PND.  Chronic mild pedal edema.  No exertional chest pain, palpitations, syncope or bleeding.  Current Outpatient Medications  Medication Sig Dispense Refill  . acetaminophen (TYLENOL) 500 MG tablet Take 1,000 mg by mouth at bedtime as needed for mild pain.     Marland Kitchen albuterol (PROVENTIL HFA;VENTOLIN HFA) 108 (90 Base) MCG/ACT inhaler Inhale 2 puffs into the lungs every 6 (six) hours as needed for wheezing or shortness of breath. 18 g 0  . amoxicillin (AMOXIL) 500 MG capsule TAKE 4 CAPSULES BY MOUTH 1 HOUR PRE DENTAL APPOINTMENT  1  . atorvastatin (LIPITOR) 80 MG tablet TAKE 1 TABLET (80 MG TOTAL) BY MOUTH EVERY EVENING. KEEP SCHEDULED APPOINTMENT 90 tablet 0  . CALCIUM-MAGNESIUM-ZINC PO Take 1 tablet by mouth at bedtime.     . cetirizine (ZYRTEC) 10 MG tablet Take 10 mg by mouth daily.    . Cholecalciferol (VITAMIN D) 2000 UNITS tablet Take 4,000 Units by mouth at bedtime.     . Coenzyme Q10 (CO Q-10) 100 MG CAPS Take 100 mg by mouth at bedtime.     . Diclofenac Sodium 3 % GEL APPLY UP TO TWICE DAILY AS DIRECTED  3  . dofetilide (TIKOSYN) 500 MCG capsule TAKE ONE CAPSULE BY MOUTH TWICE A DAY 180 capsule 2  . ELIQUIS 5 MG TABS tablet TAKE 1  TABLET BY MOUTH TWICE A DAY 180 tablet 1  . fluticasone (FLONASE) 50 MCG/ACT nasal spray USE 2 SPRAYS NASALLY DAILY 48 g 1  . furosemide (LASIX) 40 MG tablet TAKE 1&1/2 TABLET BY MOUTH DAILY 135 tablet 1  . losartan (COZAAR) 100 MG tablet TAKE 1 TABLET BY MOUTH EVERY DAY 90 tablet 1  . metFORMIN (GLUCOPHAGE-XR) 500 MG 24 hr tablet Take 2 tablets (1,000 mg total) by mouth daily with breakfast. 180 tablet 1  . metoprolol tartrate (LOPRESSOR) 25 MG tablet Take 1 tablet (25 mg total) by mouth 2 (two) times daily. KEEP OV. 180 tablet 0  . Multiple Vitamin (MULTIVITAMIN WITH MINERALS) TABS tablet Take 1 tablet by mouth daily. Centrum silver    . nitroGLYCERIN (NITROSTAT) 0.4 MG SL tablet Place 1 tablet (0.4 mg total) under the tongue every 5 (five) minutes as needed for chest pain. 25 tablet 12  . omeprazole (PRILOSEC) 20 MG capsule Take 20 mg by mouth every other day.     . testosterone cypionate (DEPOTESTOTERONE CYPIONATE) 200 MG/ML injection Inject 200 mg into the muscle every 14 (fourteen) days.      No current facility-administered medications for this visit.      Past Medical History:  Diagnosis Date  . Adenomatous colon polyp 01/2015   Dr. Penelope Coop  . Allergy    RHINITIS  . Arthritis   . Atrial fibrillation ([place])   . Basal cell carcinoma 08/14/2016  mid forhead   . Benign prostatic hypertrophy    followed by Dr. Gaynelle Arabian; regrowth R lat lob (11/2015)  . CAD (coronary artery disease)   . Cataracts, bilateral   . Cerebrovascular disease   . Diverticular disease   . Diverticulosis    seen on colonoscopy (9/05, 06/2009)  . Erectile dysfunction   . Family history of adverse reaction to anesthesia    mother had memeory problems after anesthesia with hip replacement, lasted until she died 2 to 3 years later  . Gallstones   . GERD (gastroesophageal reflux disease)   . 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 Hepatitis age 36   "called yellow jaundice"  . HTN (hypertension)    . Hyperlipidemia   . Hyperplastic colon polyp 05/2004   tubular adenoma 01/2015  . Hypogonadism male    treated by Dr. Gaynelle Arabian  . Myocardial infarction [facility]) '98, '09   (Dr. Stanford Breed); s/p stents  . Nocturia   . Obesity   . Peripheral vascular disease ([employer])    02/28/12 MOST RECENT CAROTID DUPLEX--"STABLE MILD CALCIFIED PLAQUE, BILATERALY, STABLE, OVER SERIAL EXAMS"  . Pneumonia age 20   hx of  . Prostatitis   . 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 Sepsis due to Escherichia coli [facility]) ? 2011   DUE TO URINARY OUTLET OBSTRUCTION /INFECTION  . Shortness of breath dyspnea    with exertion only  . Sleep apnea    mild, CPAP wasn't indicated (per pt)--2014 mod-severe, on CPAP since 07/2013  . Stroke [facility]) jan 2016  . UTI (urinary tract infection)    secondary  . Visit for monitoring Tikosyn therapy     Past Surgical History:  Procedure Laterality Date  . CORONARY ANGIOPLASTY WITH STENT PLACEMENT  01/1997, stent x 1, 2009 2nd stent in same location   PTCA/stent to RCA  AND 2009 2ND STENT PLACED  . CORONARY ARTERY BYPASS GRAFT N/A 04/15/2014   Procedure: CORONARY ARTERY BYPASS GRAFTING TIMES TWO USEING LEFT INTERNAL MAMMARY ARTERY AND RIGHT GREATER SAPHENOUS VEIN VIA ENDOVEIN HARVEST;  Surgeon: Rexene Alberts, MD;  Location: [place];  Service: Open Heart Surgery;  Laterality: N/A;  . CYSTOSCOPY  11/17/15   [place]  . GREEN LIGHT LASER TURP (TRANSURETHRAL RESECTION OF PROSTATE N/A 01/15/2016   Procedure: GREEN LIGHT LASER TURP (TRANSURETHRAL RESECTION OF PROSTATE;  Surgeon: Carolan Clines, MD;  Location: [facility];  Service: Urology;  Laterality: N/A;  LASER RIGHT LATERAL LOBE OF PROSTATE AND LEFT BLADDER NECK    . INTRAOPERATIVE TRANSESOPHAGEAL ECHOCARDIOGRAM N/A 04/15/2014   Procedure: INTRAOPERATIVE  TRANSESOPHAGEAL ECHOCARDIOGRAM;  Surgeon: Rexene Alberts, MD;  Location: [place];  Service: Open Heart Surgery;  Laterality: N/A;  . JOINT REPLACEMENT     both knees  . KNEE SURGERY  05/2010   removing scar tissue R knee (Dr. Wynelle Link)  . LEFT AND RIGHT HEART CATHETERIZATION WITH CORONARY ANGIOGRAM  04/13/2014   Procedure: LEFT AND RIGHT HEART CATHETERIZATION WITH CORONARY ANGIOGRAM;  Surgeon: Sinclair Grooms, MD;  Location: [facility] C[facility] LAB;  Service: Cardiovascular;;  . TONSILLECTOMY    . TOTAL KNEE ARTHROPLASTY  L 4/06, R 4/07  . TOTAL KNEE REVISION  09/23/2012   Procedure: TOTAL KNEE REVISION;  Surgeon: Gearlean Alf, MD; right side  Location: [facility];  Service: Orthopedics;  Laterality: Right;  . TRANSURETHRAL RESECTION OF PROSTATE  06/2010  . VASECTOMY      Social History   Socioeconomic History  . Marital status: Married    Spouse name: Not on file  . Number of children: 4  . Years of education: Not on file  . Highest education level: Not on file  Occupational History  . Occupation: Engineer, maintenance (IT)  Social Needs  . Financial resource strain: Not on file  . Food insecurity    Worry: Not on file    Inability: Not on file  . Transportation needs    Medical: Not on file    Non-medical: Not on file  Tobacco Use  . Smoking status: Former Smoker    Packs/day: 1.50    Years: 30.00    Pack years: 45.00    Types: Cigarettes    Quit date: 09/16/1986    Years since quitting: 32.8  . Smokeless tobacco: Never Used  . Tobacco comment: smoked 1-3 PPD from age 24 to 67  Substance and Sexual Activity  . Alcohol use: Yes    Alcohol/week: 1.0 standard drinks    Types: 1 Glasses of wine per week    Comment: 1 glass of wine 2-3 times a week  . Drug use: No  . Sexual activity: Not Currently  Lifestyle  . Physical activity    Days per week: Not on file    Minutes per session: Not on file  . Stress: Not on file  Relationships  . Social Herbalist on phone: Not on file    Gets together:  Not on file    Attends religious service: Not on file    Active member of club or organization: Not on file    Attends meetings of clubs or organizations: Not on file    Relationship status: Not on file  . Intimate partner violence    Fear of current or ex partner: Not on file    Emotionally abused: Not on file    Physically abused: Not on file    Forced sexual activity: Not on file  Other Topics Concern  . Not on file  Social History Narrative   Lives with wife.  4 kids, 1 stepchild (1 in [place], 4 in [place]). 11 grandchildren, 2 great grandchild   No pets. Wife is a diabetic.   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 Coronary artery disease Mother        deceased  . Dementia Mother   . Atrial fibrillation Mother   . Stroke Mother        related to atrial fib  . Coronary artery disease Father        deceased  . Hypertension Father   . Heart disease Father   . Hypertension Paternal Grandmother   . Heart disease Paternal Grandmother   . Hypertension Paternal Grandfather   . Heart disease Paternal Grandfather   . Hypothyroidism Daughter        congenital  . Cancer Paternal Uncle        colon; died in his 4's  . Colon cancer Paternal Uncle   . Diabetes Neg Hx     ROS: Knee pain but no fevers or chills, productive cough, hemoptysis, dysphasia, odynophagia, melena, hematochezia, dysuria, hematuria, rash, seizure activity, orthopnea, PND,  claudication. Remaining systems are negative.  Physical Exam: Well-developed obese in no acute distress.  Skin is warm and dry.  HEENT is normal.  Neck is supple.  Chest is clear to auscultation with normal expansion.  Cardiovascular exam is regular rate and rhythm. 2/6 systolic murmur Abdominal exam nontender or distended. No masses palpated. Extremities show 1+ edema. neuro grossly intact  ECG-sinus rhythm at a rate of 64, nonspecific ST changes.   Personally reviewed  A/P  1 coronary artery disease status post coronary artery bypass graft-patient denies recurrent chest pain.  Continue statin.  No aspirin given need for anticoagulation.  2 paroxysmal atrial fibrillation-patient remains in sinus rhythm with no recurrences.  Continue beta-blocker and Tikosyn.  Continue apixaban.   3 chronic diastolic congestive heart failure-euvolemic today on examination though difficult due to obesity.  Continue Lasix at present dose.    4 hypertension-blood pressure controlled.  Continue present medications and follow.  5 hyperlipidemia-continue statin.    6 history of mild aortic stenosis-I will arrange follow-up echocardiogram.  7 history of mild carotid artery disease-continue statin.  8 sleep apnea-we will continue with CPAP.  Kirk Ruths, MD

## 2019-07-19 ENCOUNTER — Other Ambulatory Visit: Payer: Self-pay

## 2019-07-19 ENCOUNTER — Encounter: Payer: Self-pay | Admitting: Cardiology

## 2019-07-19 ENCOUNTER — Ambulatory Visit: Payer: Medicare HMO | Admitting: Cardiology

## 2019-07-19 VITALS — BP 112/90 | HR 57 | Ht 68.0 in | Wt 277.6 lb

## 2019-07-19 DIAGNOSIS — I350 Nonrheumatic aortic (valve) stenosis: Secondary | ICD-10-CM | POA: Diagnosis not present

## 2019-07-19 DIAGNOSIS — I2510 Atherosclerotic heart disease of native coronary artery without angina pectoris: Secondary | ICD-10-CM

## 2019-07-19 DIAGNOSIS — I10 Essential (primary) hypertension: Secondary | ICD-10-CM

## 2019-07-19 DIAGNOSIS — I480 Paroxysmal atrial fibrillation: Secondary | ICD-10-CM | POA: Diagnosis not present

## 2019-07-19 MED ORDER — NITROGLYCERIN 0.4 MG SL SUBL: 0.4000 mg | SUBLINGUAL_TABLET | SUBLINGUAL | 12 refills | Status: AC | PRN

## 2019-07-19 MED ORDER — NITROGLYCERIN 0.4 MG SL SUBL: 0.4000 mg | SUBLINGUAL_TABLET | SUBLINGUAL | 12 refills | Status: DC | PRN

## 2019-07-19 NOTE — Patient Instructions (Addendum)
Medication Instructions:  NO CHANGE *If you need a refill on your cardiac medications before your next appointment, please call your pharmacy*  Lab Work: If you have labs (blood work) drawn today and your tests are completely normal, you will receive your results only by: Marland Kitchen MyChart Message (if you have MyChart) OR . A paper copy in the mail If you have any lab test that is abnormal or we need to change your treatment, we will call you to review the results.  Testing/Procedures: Your physician has requested that you have an echocardiogram. Echocardiography is a painless test that uses sound waves to create images of your heart. It provides your doctor with information about the size and shape of your heart and how well your heart's chambers and valves are working. This procedure takes approximately one hour. There are no restrictions for this procedure.[place]    Follow-Up: At [facility], you and your health needs are our priority.  As part of our continuing mission to provide you with exceptional heart care, we have created designated Provider Care Teams.  These Care Teams include your primary Cardiologist (physician) and Advanced Practice Providers (APPs -  Physician Assistants and Nurse Practitioners) who all work together to provide you with the care you need, when you need it.  Your next appointment:   6 months  The format for your next appointment:   In Person  Provider:   Kirk Ruths, MD

## 2019-07-22 ENCOUNTER — Other Ambulatory Visit: Payer: Self-pay | Admitting: Cardiology

## 2019-07-22 DIAGNOSIS — I4891 Unspecified atrial fibrillation: Secondary | ICD-10-CM

## 2019-07-22 NOTE — Telephone Encounter (Signed)
Refill Request.  

## 2019-07-26 ENCOUNTER — Other Ambulatory Visit: Payer: Self-pay

## 2019-07-26 ENCOUNTER — Ambulatory Visit (HOSPITAL_COMMUNITY): Payer: Medicare HMO | Attending: Internal Medicine

## 2019-07-26 DIAGNOSIS — I350 Nonrheumatic aortic (valve) stenosis: Secondary | ICD-10-CM | POA: Insufficient documentation

## 2019-07-26 MED ORDER — PERFLUTREN LIPID MICROSPHERE
1.0000 mL | INTRAVENOUS | Status: AC | PRN
  Administered 2019-07-26: 2 mL via INTRAVENOUS

## 2019-07-27 DIAGNOSIS — H35372 Puckering of macula, left eye: Secondary | ICD-10-CM | POA: Diagnosis not present

## 2019-07-27 DIAGNOSIS — H35363 Drusen (degenerative) of macula, bilateral: Secondary | ICD-10-CM | POA: Diagnosis not present

## 2019-07-27 DIAGNOSIS — E119 Type 2 diabetes mellitus without complications: Secondary | ICD-10-CM | POA: Diagnosis not present

## 2019-07-27 DIAGNOSIS — H04123 Dry eye syndrome of bilateral lacrimal glands: Secondary | ICD-10-CM | POA: Diagnosis not present

## 2019-07-27 DIAGNOSIS — H2513 Age-related nuclear cataract, bilateral: Secondary | ICD-10-CM | POA: Diagnosis not present

## 2019-07-27 DIAGNOSIS — H25013 Cortical age-related cataract, bilateral: Secondary | ICD-10-CM | POA: Diagnosis not present

## 2019-07-27 LAB — HM DIABETES EYE EXAM

## 2019-07-29 ENCOUNTER — Encounter: Payer: Self-pay | Admitting: *Deleted

## 2019-08-03 DIAGNOSIS — G4733 Obstructive sleep apnea (adult) (pediatric): Secondary | ICD-10-CM | POA: Diagnosis not present

## 2019-08-25 ENCOUNTER — Other Ambulatory Visit: Payer: Self-pay | Admitting: Cardiology

## 2019-09-01 DIAGNOSIS — G4733 Obstructive sleep apnea (adult) (pediatric): Secondary | ICD-10-CM | POA: Diagnosis not present

## 2019-09-06 DIAGNOSIS — E291 Testicular hypofunction: Secondary | ICD-10-CM | POA: Diagnosis not present

## 2019-09-06 DIAGNOSIS — R948 Abnormal results of function studies of other organs and systems: Secondary | ICD-10-CM | POA: Diagnosis not present

## 2019-09-13 DIAGNOSIS — N5201 Erectile dysfunction due to arterial insufficiency: Secondary | ICD-10-CM | POA: Diagnosis not present

## 2019-09-13 DIAGNOSIS — E291 Testicular hypofunction: Secondary | ICD-10-CM | POA: Diagnosis not present

## 2019-09-14 ENCOUNTER — Other Ambulatory Visit: Payer: Self-pay | Admitting: Family Medicine

## 2019-09-14 DIAGNOSIS — E1151 Type 2 diabetes mellitus with diabetic peripheral angiopathy without gangrene: Secondary | ICD-10-CM

## 2019-09-29 ENCOUNTER — Other Ambulatory Visit: Payer: Self-pay | Admitting: Cardiology

## 2019-10-02 DIAGNOSIS — G4733 Obstructive sleep apnea (adult) (pediatric): Secondary | ICD-10-CM | POA: Diagnosis not present

## 2019-10-05 ENCOUNTER — Other Ambulatory Visit: Payer: Self-pay | Admitting: Cardiology

## 2019-10-11 DIAGNOSIS — R69 Illness, unspecified: Secondary | ICD-10-CM | POA: Diagnosis not present

## 2019-10-12 ENCOUNTER — Telehealth: Payer: Self-pay | Admitting: Cardiology

## 2019-10-12 NOTE — Telephone Encounter (Signed)
   [facility] Pre-operative Risk Assessment    Request for surgical clearance:  1. What type of surgery is being performed? In office procedure prostate ultrasound biopsy  2. When is this surgery scheduled? 2/2  3. What type of clearance is required (medical clearance vs. Pharmacy clearance to hold med vs. Both)? both  4. Are there any medications that need to be held prior to surgery and how long? Hold  eliquis and tikosyn 3 days prior  5. Practice name and name of physician performing surgery? [facility]   6. What is your office phone number: [phone]   7.   What is your office fax number: [phone]  8.   Anesthesia type (None, local, MAC, general) ? none   Selena Zobro 10/12/2019, 1:58 PM  _________________________________________________________________   (provider comments below)

## 2019-10-12 NOTE — Telephone Encounter (Signed)
Patient with diagnosis of afib on Eliquis for anticoagulation.    Procedure: In office procedure prostate ultrasound biopsy Date of procedure: 10/19/19  CHADS2-VASc score of  8 (CHF, HTN, AGE, DM2, stroke/tia x 2, CAD, AGE)  CrCl 72 ml/min  Patient is high risk off anticoagulation due to history of stroke. MD is requesting a 3 day hold. I will defer to MD.

## 2019-10-12 NOTE — Telephone Encounter (Signed)
   Primary Cardiologist: Kirk Ruths, MD  Chart reviewed as part of pre-operative protocol coverage. Given past medical history and time since last visit, based on ACC/AHA guidelines, Jeffrey Wiggins would be at acceptable risk for the planned procedure without further cardiovascular testing.   Pt is high risk off anticoagulation due to history of stroke. Per Dr. Stanford Breed, can hold apixaban for only 2 days prior to procedure and resume after when okay with urology.  There is no indication to withhold Tikosyn. Please do not hold.   I will route this recommendation to the requesting party via Epic fax function and remove from pre-op pool.  Please call with questions.  Daune Perch, NP 10/12/2019, 3:20 PM

## 2019-10-12 NOTE — Telephone Encounter (Signed)
Chart reviewed at part of preop clearance. There is a request to hold Eliquis and Tikosyn for 3 days. Tikosyn should not be interrupted once established for control of atrial fibrillation.  I will route request to our pharmacist for recommendation on anticoagulation.   Then will call pt for update on status for clearance.

## 2019-10-12 NOTE — Telephone Encounter (Signed)
Hold apixaban 2 days prior to procedure and resume after when okay with urology. Kirk Ruths

## 2019-10-19 DIAGNOSIS — C61 Malignant neoplasm of prostate: Secondary | ICD-10-CM | POA: Diagnosis not present

## 2019-10-19 DIAGNOSIS — R972 Elevated prostate specific antigen [PSA]: Secondary | ICD-10-CM | POA: Diagnosis not present

## 2019-10-26 DIAGNOSIS — C61 Malignant neoplasm of prostate: Secondary | ICD-10-CM | POA: Diagnosis not present

## 2019-10-26 DIAGNOSIS — N3000 Acute cystitis without hematuria: Secondary | ICD-10-CM | POA: Diagnosis not present

## 2019-10-27 ENCOUNTER — Encounter: Payer: Self-pay | Admitting: Family Medicine

## 2019-10-27 DIAGNOSIS — C61 Malignant neoplasm of prostate: Secondary | ICD-10-CM | POA: Insufficient documentation

## 2019-11-02 DIAGNOSIS — G4733 Obstructive sleep apnea (adult) (pediatric): Secondary | ICD-10-CM | POA: Diagnosis not present

## 2019-11-08 DIAGNOSIS — R69 Illness, unspecified: Secondary | ICD-10-CM | POA: Diagnosis not present

## 2019-11-13 ENCOUNTER — Other Ambulatory Visit: Payer: Self-pay | Admitting: Cardiology

## 2019-11-22 ENCOUNTER — Other Ambulatory Visit: Payer: Self-pay | Admitting: Cardiology

## 2019-12-03 DIAGNOSIS — G4733 Obstructive sleep apnea (adult) (pediatric): Secondary | ICD-10-CM | POA: Diagnosis not present

## 2019-12-30 ENCOUNTER — Other Ambulatory Visit: Payer: Self-pay | Admitting: Cardiology

## 2020-01-03 DIAGNOSIS — G4733 Obstructive sleep apnea (adult) (pediatric): Secondary | ICD-10-CM | POA: Diagnosis not present

## 2020-01-13 NOTE — Progress Notes (Signed)
HPI: FU coronary artery disease, PAF,hypertension, and hyperlipidemia. Cath 7/15 with severe CAD and EF 50. He underwent bypass grafting x2 with LIMA to the LAD and vein graft to the first obtuse marginal branch of the left circumflex. Abd ultrasound 9/15 showed no aneurysm. Also with h/o aflutter, PAF and CVA. Nuclear study 1/18 showed EF 38, inferior MI and mild peri-infarct ischemia. Carotid dopplers 1/18 showed 1-39 bilateral stenosis.Echocardiogram November 2020 showed ejection fraction 45 to 50%, moderate left ventricular hypertrophy, grade 2 diastolic dysfunction and moderate aortic stenosis (mean gradient measured at 12.5 mmHg and aortic valve area 1.2 cm). Since last seen,he has some dyspnea on exertion but no orthopnea or PND.  No chest pain or syncope.  His pedal edema has worsened.  Current Outpatient Medications  Medication Sig Dispense Refill  . acetaminophen (TYLENOL) 500 MG tablet Take 1,000 mg by mouth at bedtime as needed for mild pain.     Marland Kitchen albuterol (PROVENTIL HFA;VENTOLIN HFA) 108 (90 Base) MCG/ACT inhaler Inhale 2 puffs into the lungs every 6 (six) hours as needed for wheezing or shortness of breath. 18 g 0  . amoxicillin (AMOXIL) 500 MG capsule TAKE 4 CAPSULES BY MOUTH 1 HOUR PRE DENTAL APPOINTMENT  1  . atorvastatin (LIPITOR) 80 MG tablet TAKE 1 TABLET BY MOUTH EVERY EVENING 90 tablet 1  . CALCIUM-MAGNESIUM-ZINC PO Take 1 tablet by mouth at bedtime.     . cetirizine (ZYRTEC) 10 MG tablet Take 10 mg by mouth daily.    . Cholecalciferol (VITAMIN D) 2000 UNITS tablet Take 4,000 Units by mouth at bedtime.     . Coenzyme Q10 (CO Q-10) 100 MG CAPS Take 100 mg by mouth at bedtime.     . dofetilide (TIKOSYN) 500 MCG capsule TAKE 1 CAPSULE BY MOUTH TWICE A DAY 180 capsule 3  . ELIQUIS 5 MG TABS tablet TAKE 1 TABLET BY MOUTH TWICE A DAY 180 tablet 1  . fluticasone (FLONASE) 50 MCG/ACT nasal spray USE 2 SPRAYS NASALLY DAILY 48 g 1  . furosemide (LASIX) 40 MG tablet TAKE  1&1/2 TABLET BY MOUTH DAILY 135 tablet 2  . losartan (COZAAR) 100 MG tablet TAKE 1 TABLET BY MOUTH EVERY DAY 90 tablet 1  . metFORMIN (GLUCOPHAGE-XR) 500 MG 24 hr tablet TAKE 2 TABLETS BY MOUTH EVERY DAY WITH BREAKFAST 180 tablet 1  . metoprolol tartrate (LOPRESSOR) 25 MG tablet TAKE 1 TABLET (25 MG TOTAL) BY MOUTH 2 (TWO) TIMES DAILY. KEEP OV. 180 tablet 1  . Multiple Vitamin (MULTIVITAMIN WITH MINERALS) TABS tablet Take 1 tablet by mouth daily. Centrum silver    . nitroGLYCERIN (NITROSTAT) 0.4 MG SL tablet Place 1 tablet (0.4 mg total) under the tongue every 5 (five) minutes as needed for chest pain. 25 tablet 12  . omeprazole (PRILOSEC) 20 MG capsule Take 20 mg by mouth every other day.      No current facility-administered medications for this visit.     Past Medical History:  Diagnosis Date  . Adenomatous colon polyp 01/2015   Dr. Penelope Coop  . Allergy    RHINITIS  . Arthritis   . Atrial fibrillation ([place])   . Basal cell carcinoma 08/14/2016   mid forhead   . Benign prostatic hypertrophy    followed by Dr. Gaynelle Arabian; regrowth R lat lob (11/2015)  . CAD (coronary artery disease)   . Cataracts, bilateral   . Cerebrovascular disease   . Diverticular disease   . Diverticulosis    seen on colonoscopy (  9/05, 06/2009)  . Erectile dysfunction   . Family history of adverse reaction to anesthesia    mother had memeory problems after anesthesia with hip replacement, lasted until she died 2 to 3 years later  . Gallstones   . GERD (gastroesophageal reflux disease)   . 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 Hepatitis age 60   "called yellow jaundice"  . HTN (hypertension)   . Hyperlipidemia   . Hyperplastic colon polyp 05/2004   tubular adenoma 01/2015  . Hypogonadism male    treated by Dr. Gaynelle Arabian  . Myocardial infarction [facility]) '98, '09   (Dr. Stanford Breed); s/p stents  . Nocturia   . Obesity   . Peripheral vascular disease ([place])    02/28/12 MOST RECENT CAROTID  DUPLEX--"STABLE MILD CALCIFIED PLAQUE, BILATERALY, STABLE, OVER SERIAL EXAMS"  . Pneumonia age 15   hx of  . Prostatitis   . 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 Sepsis due to Escherichia coli [facility]) ? 2011   DUE TO URINARY OUTLET OBSTRUCTION /INFECTION  . Shortness of breath dyspnea    with exertion only  . Sleep apnea    mild, CPAP wasn't indicated (per pt)--2014 mod-severe, on CPAP since 07/2013  . Stroke [facility]) jan 2016  . UTI (urinary tract infection)    secondary  . Visit for monitoring Tikosyn therapy     Past Surgical History:  Procedure Laterality Date  . CORONARY ANGIOPLASTY WITH STENT PLACEMENT  01/1997, stent x 1, 2009 2nd stent in same location   PTCA/stent to RCA  AND 2009 2ND STENT PLACED  . CORONARY ARTERY BYPASS GRAFT N/A 04/15/2014   Procedure: CORONARY ARTERY BYPASS GRAFTING TIMES TWO USEING LEFT INTERNAL MAMMARY ARTERY AND RIGHT GREATER SAPHENOUS VEIN VIA ENDOVEIN HARVEST;  Surgeon: Rexene Alberts, MD;  Location: [place];  Service: Open Heart Surgery;  Laterality: N/A;  . CYSTOSCOPY  11/17/15   Tannenbaum  . GREEN LIGHT LASER TURP (TRANSURETHRAL RESECTION OF PROSTATE N/A 01/15/2016   Procedure: GREEN LIGHT LASER TURP (TRANSURETHRAL RESECTION OF PROSTATE;  Surgeon: Carolan Clines, MD;  Location: [facility];  Service: Urology;  Laterality: N/A;  LASER RIGHT LATERAL LOBE OF PROSTATE AND LEFT BLADDER NECK    . INTRAOPERATIVE TRANSESOPHAGEAL ECHOCARDIOGRAM N/A 04/15/2014   Procedure: INTRAOPERATIVE TRANSESOPHAGEAL ECHOCARDIOGRAM;  Surgeon: Rexene Alberts, MD;  Location: [place];  Service: Open Heart Surgery;  Laterality: N/A;  . JOINT REPLACEMENT     both knees  . KNEE SURGERY  05/2010   removing scar tissue R knee (Dr. Wynelle Link)  . LEFT AND RIGHT HEART CATHETERIZATION WITH CORONARY ANGIOGRAM  04/13/2014    Procedure: LEFT AND RIGHT HEART CATHETERIZATION WITH CORONARY ANGIOGRAM;  Surgeon: Sinclair Grooms, MD;  Location: [facility] C[facility];  Service: Cardiovascular;;  . TONSILLECTOMY    . TOTAL KNEE ARTHROPLASTY  L 4/06, R 4/07  . TOTAL KNEE REVISION  09/23/2012   Procedure: TOTAL KNEE REVISION;  Surgeon: Gearlean Alf, MD; right side  Location: [facility];  Service: Orthopedics;  Laterality: Right;  . TRANSURETHRAL RESECTION OF PROSTATE  06/2010  . VASECTOMY      Social History   Socioeconomic History  . Marital status: Married    Spouse name: Not on  file  . Number of children: 4  . Years of education: Not on file  . Highest education level: Not on file  Occupational History  . Occupation: Engineer, maintenance (IT)  Tobacco Use  . Smoking status: Former Smoker    Packs/day: 1.50    Years: 30.00    Pack years: 45.00    Types: Cigarettes    Quit date: 09/16/1986    Years since quitting: 33.3  . Smokeless tobacco: Never Used  . Tobacco comment: smoked 1-3 PPD from age 10 to 30  Substance and Sexual Activity  . Alcohol use: Yes    Alcohol/week: 1.0 standard drinks    Types: 1 Glasses of wine per week    Comment: 1 glass of wine 2-3 times a week  . Drug use: No  . Sexual activity: Not Currently  Other Topics Concern  . Not on file  Social History Narrative   Lives with wife.  4 kids, 1 stepchild (1 in [place], 4 in [place]). 11 grandchildren, 2 great grandchild   No pets. Wife is a diabetic.   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 Difficulty of Paying Living Expenses:   Food Insecurity:   . Worried About Charity fundraiser in the Last Year:   . Arboriculturist in the Last Year:   Transportation Needs:   . Film/video editor (Medical):   Marland Kitchen Lack of Transportation (Non-Medical):   Physical Activity:   . Days of Exercise per Week:   . Minutes of Exercise per Session:   Stress:   . Feeling of Stress :   Social  Connections:   . Frequency of Communication with Friends and Family:   . Frequency of Social Gatherings with Friends and Family:   . Attends Religious Services:   . Active Member of Clubs or Organizations:   . Attends Archivist Meetings:   Marland Kitchen Marital Status:   Intimate Partner Violence:   . Fear of Current or Ex-Partner:   . Emotionally Abused:   Marland Kitchen Physically Abused:   . Sexually Abused:     Family History  Problem Relation Age of Onset  . Coronary artery disease Mother        deceased  . Dementia Mother   . Atrial fibrillation Mother   . Stroke Mother        related to atrial fib  . Coronary artery disease Father        deceased  . Hypertension Father   . Heart disease Father   . Hypertension Paternal Grandmother   . Heart disease Paternal Grandmother   . Hypertension Paternal Grandfather   . Heart disease Paternal Grandfather   . Hypothyroidism Daughter        congenital  . Cancer Paternal Uncle        colon; died in his 57's  . Colon cancer Paternal Uncle   . Diabetes Neg Hx     ROS: no fevers or chills, productive cough, hemoptysis, dysphasia, odynophagia, melena, hematochezia, dysuria, hematuria, rash, seizure activity, orthopnea, PND, claudication. Remaining systems are negative.  Physical Exam: Well-developed obese in no acute distress.  Skin is warm and dry.  HEENT is normal.  Neck is supple.  Chest is clear to auscultation with normal expansion.  Cardiovascular exam is regular rate and rhythm. 2/6 systolic murmur Abdominal exam nontender or distended. No masses palpated. Extremities show 1+ edema.  neuro grossly intact  ECG-sinus rhythm at a rate of 61, inferior infarct, left atrial enlargement, nonspecific ST changes.  Personally reviewed  A/P  1 coronary artery disease status post coronary artery bypass graft-patient has not had recurrent chest pain.  Continue statin.  He is not on aspirin given need for anticoagulation.  2 paroxysmal  atrial fibrillation-patient is in sinus rhythm today.  Continue Tikosyn and beta-blocker at present dose.  Continue apixaban.  Check hemoglobin, renal function and magnesium.  3 chronic diastolic congestive heart failure-patient with worsening pedal edema.  Increase Lasix to 80 mg daily.  In 1 week check potassium and renal function.  4 hypertension-blood pressure controlled.  Continue present medications.  5 hyperlipidemia-continue statin.  6 history of aortic stenosis-mild to moderate on most recent echocardiogram.  We will plan repeat study November 2021.  7 history of mild carotid artery disease-plan to continue statin.  8 sleep apnea-continue CPAP.  Kirk Ruths, MD

## 2020-01-17 ENCOUNTER — Ambulatory Visit: Payer: Medicare HMO | Admitting: Cardiology

## 2020-01-17 ENCOUNTER — Other Ambulatory Visit: Payer: Self-pay

## 2020-01-17 ENCOUNTER — Encounter: Payer: Self-pay | Admitting: Cardiology

## 2020-01-17 VITALS — BP 138/78 | HR 86 | Ht 68.0 in | Wt 268.2 lb

## 2020-01-17 DIAGNOSIS — I2510 Atherosclerotic heart disease of native coronary artery without angina pectoris: Secondary | ICD-10-CM | POA: Diagnosis not present

## 2020-01-17 DIAGNOSIS — I480 Paroxysmal atrial fibrillation: Secondary | ICD-10-CM

## 2020-01-17 DIAGNOSIS — I350 Nonrheumatic aortic (valve) stenosis: Secondary | ICD-10-CM | POA: Diagnosis not present

## 2020-01-17 MED ORDER — FUROSEMIDE 40 MG PO TABS: 80.0000 mg | ORAL_TABLET | Freq: Every day | ORAL | 3 refills | Status: DC

## 2020-01-17 NOTE — Patient Instructions (Signed)
Medication Instructions:  INCREASE FUROSEMIDE TO 80 MG ONCE DAILY= 2 OF THE 40 MG TABLETS ONCE DAILY  *If you need a refill on your cardiac medications before your next appointment, please call your pharmacy*   Lab Work: Your physician recommends that you return for lab work in: [place]  If you have labs (blood work) drawn today and your tests are completely normal, you will receive your results only by: Marland Kitchen MyChart Message (if you have MyChart) OR . A paper copy in the mail If you have any lab test that is abnormal or we need to change your treatment, we will call you to review the results.  Follow-Up: At [facility], you and your health needs are our priority.  As part of our continuing mission to provide you with exceptional heart care, we have created designated Provider Care Teams.  These Care Teams include your primary Cardiologist (physician) and Advanced Practice Providers (APPs -  Physician Assistants and Nurse Practitioners) who all work together to provide you with the care you need, when you need it.  We recommend signing up for the patient portal called "MyChart".  Sign up information is provided on this After Visit Summary.  MyChart is used to connect with patients for Virtual Visits (Telemedicine).  Patients are able to view lab/test results, encounter notes, upcoming appointments, etc.  Non-urgent messages can be sent to your provider as well.   To learn more about what you can do with MyChart, go to [email or web address].    Your next appointment:   6 month(s)  The format for your next appointment:   Either In Person or Virtual  Provider:   You may see Kirk Ruths, MD or one of the following Advanced Practice Providers on your designated Care Team:    Kerin Ransom, PA-C  [place], [place]  Coletta Memos, [place]

## 2020-01-20 ENCOUNTER — Other Ambulatory Visit: Payer: Self-pay | Admitting: Cardiology

## 2020-01-20 DIAGNOSIS — I4891 Unspecified atrial fibrillation: Secondary | ICD-10-CM

## 2020-01-24 DIAGNOSIS — I480 Paroxysmal atrial fibrillation: Secondary | ICD-10-CM | POA: Diagnosis not present

## 2020-01-25 ENCOUNTER — Encounter: Payer: Self-pay | Admitting: *Deleted

## 2020-01-25 LAB — BASIC METABOLIC PANEL
BUN/Creatinine Ratio: 23 (ref 10–24)
BUN: 24 mg/dL (ref 8–27)
CO2: 25 mmol/L (ref 20–29)
Calcium: 9.8 mg/dL (ref 8.6–10.2)
Chloride: 103 mmol/L (ref 96–106)
Creatinine, Ser: 1.06 mg/dL (ref 0.76–1.27)
GFR calc Af Amer: 77 mL/min/{1.73_m2} (ref >59)
GFR calc non Af Amer: 67 mL/min/{1.73_m2} (ref >59)
Glucose: 82 mg/dL (ref 65–99)
Potassium: 4.7 mmol/L (ref 3.5–5.2)
Sodium: 144 mmol/L (ref 134–144)

## 2020-01-25 LAB — CBC
Hematocrit: 50.8 % (ref 37.5–51.0)
Hemoglobin: 16.7 g/dL (ref 13.0–17.7)
MCH: 27.7 pg (ref 26.6–33.0)
MCHC: 32.9 g/dL (ref 31.5–35.7)
MCV: 84 fL (ref 79–97)
Platelets: 195 10*3/uL (ref 150–450)
RBC: 6.02 x10E6/uL — ABNORMAL HIGH (ref 4.14–5.80)
RDW: 17.2 % — ABNORMAL HIGH (ref 11.6–15.4)
WBC: 7.5 10*3/uL (ref 3.4–10.8)

## 2020-01-25 LAB — MAGNESIUM: Magnesium: 2 mg/dL (ref 1.6–2.3)

## 2020-02-02 DIAGNOSIS — G4733 Obstructive sleep apnea (adult) (pediatric): Secondary | ICD-10-CM | POA: Diagnosis not present

## 2020-03-27 ENCOUNTER — Other Ambulatory Visit: Payer: Self-pay | Admitting: Family Medicine

## 2020-03-27 DIAGNOSIS — E1151 Type 2 diabetes mellitus with diabetic peripheral angiopathy without gangrene: Secondary | ICD-10-CM

## 2020-04-09 NOTE — Progress Notes (Addendum)
Chief Complaint  Patient presents with  . Medicare Wellness    fasting AWV/CPE. Having SOB when he does things, none when he is just sitting. After 10 minutes on his bike he is very winded. Did get COVID vaccines, card is in car and he will get after visit. Did not get shingrix vaccines. Unsure if he wants to have another colonoscopy, although he is due.     Jeffrey Wiggins is a 79 y.o. male who presents for annual physical exam, Medicare wellness visit and follow-up on chronic medical conditions.  His last visit was a year ago, done virtually.  He was supposed to f/u in 3 months in the office, but hasn't been seen.  Past due. He has the following concerns:  Diabetes:  Diabetes was diagnosed 05/2013 based on A1c of 6.7.He hadbeen controlled just with diet (only took metformin for a short time initially), until 06/2018, at which time his A1c was back up to 6.5, and Metformin was started. He is tolerating Metformin with side effects.His wife occasionally checks his sugars, and in the mornings it runs 120 (a little higher or a little lower).  Max of 138. Normal meals are low in carbs. Wife continues to bake, and he does partake (peach cobblers), sometimes 2-3x/week, sometimes daily. No polydipsia, polyuria (except related to his lasix use in the morning), hypoglycemia, numbness, tingling, vision or feet concerns. Occasionally gets sores on the backs of the heels, sometimes gets drainage, uses antibacterial ointment and they heal.  None currently. Last eye exam was11/2020. Lab Results  Component Value Date   HGBA1C 6.9 (H) 03/18/2019   He has elevated uric acid levels, and previously reported having R great toe pain when he ate shrimp.  He cut back on the frequency of eating shrimp. He denies any joint pain/toe pain.  Has shrimp salad 2x/month. He was given information on low purine diet at his last visit (09/2018). Uric acid remained elevated on last check. Lab Results  Component Value Date    LABURIC 8.0 03/18/2019   Obesity:exercises regularly, eats small portions, healthy foods. Baked goods as reported above.  1 glass of wine 2-3/week. Unsweet tea when he goes out to eat. Mostly drinks water. Occasional sweets (other than the baked goods.) He was previously encouraged to consider the [facility] through [facility] at his October 2019 visit.  He states he is not interested in pursuing this, even though it isn't virtual now.  GERD--controlled with meds.Compliant with taking prilosec daily, and denies heartburn.Denies dysphagia. Hasn't missed any pills recently to know if symptoms recur (has had in the past).  Some chronic cough, gets up clear-whitemucus a few times/day--chronic and unchanged.  He has DOE, such as with the 17 stairs down to his basement, or 15 minutes on the exercise bike.  He has had albuterol in the past, not used recently.  OSA-- compliant with use of CPAP.  Reports feeling refreshed in the mornings, denies daytime somnolence.   Hyperlipidemia: He reports compliance with taking 41m lipitor, denies side effects. Is reportedly following a lowfat, low cholesterol diet.He does not take any fish oil, has never tried it. 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 He no longersees Dr. SLeonie Man Goalsrecommended by  neuro:BP <130/90, hemoglobin A1c goal below 6.5% and lipids with LDL cholesterol goal below 70 mg/dL. Denies bleeding/bruising.  He remains under the care of Dr. Stanford Breed for atrial fibrillation, HTN, CAD, diastolic heart failure. He was last seen in May.  At that time he had increased edema, and lasix was increased to 1m daily. He notices more urinary frequency for 4 hours after taking it, but  hasn't noticed a significant improvement in his swelling. HTN:  He is compliant with Losartan and Lopressor. BP's at home are running 140-145/70-80 at home. He denies headaches, dizziness, chest pain, palpitations, orthopnea. Afib--he remains on Eliquis without any bleeding or complications.  Doing well on Tikosyn, lopressor, denies palpitations. Rate is controlled on his current regimen. CAD: S/p CABG; stable. Not on aspirin due to being on eliquis.He continues to have some breathlessnesswith exertion, just slightly worse than it was years past, no recent change.No SOB at rest.Finds himself breathing hard justwalking through the house, stairs to the basement.  No SOB with bathing, getting dressed.. Cardiologistpreviously mentioned thinking thatsome of his dyspnea may be secondary to history of obstructive sleep apnea, obesity and COPD.  He hasn't seen a pulmonary doctor (for DOE or cough), and isn't interested.  Spirometry in 2018 showed restriction.  Diastolic heart failure--on lasix per cardiologist.Lasix dose was increased in May to 840mdaily, due to worsening swelling. While he notices voiding more, doesn't report much change in his swelling.  Better when he wears compression stockings. Carotid artery stenosis--last [place] 09/2016, and showed 1-39% blockage, no f/u needed per Dr. CrStanford BreedProstate cancer:  This was diagnosed in 10/2019. He last saw Dr. HeLouis Meckeln 10/2019, at which time his testosterone injections were stopped.  He is due for repeat biopsy next month.  Last PSA was 3.73 in 08/2019 (per 10/2019 note) He has been under the care of Dr. HeLouis Meckelor hypogonadism and BPH. He had been getting testosterone injections weekly, and using Trimix injections for ED,  He is no longer taking these injections, still has ED. He hasn't noticed any significant change in how he feels since stopping the testosterone injections. He is s/pTURP.Denies any urinary retention.Up once a night to  void.   Immunization History  Administered Date(s) Administered  . Influenza Split 07/07/2012, 06/18/2013, 07/18/2015  . Influenza, High Dose Seasonal PF 06/14/2014, 06/16/2016, 06/29/2018  . Pneumococcal Conjugate-13 08/18/2014  . Pneumococcal Polysaccharide-23 08/20/2007, 12/24/2017  . Td 08/16/2002  . Tdap 03/16/2012  . Zoster 12/04/2012  He had his COVID vaccines (will get [place] dates) Last colonoscopy: 01/2015 with Dr. GaPenelope Coop+tubular adenoma.  He is very hesitant to do this again. Last PSA: 3.73 08/2019 Dentist: twice yearly  Ophtho: yearly, UTD Exercise: Twice a week x 45-60 minutes.  He has to stop after15 minutes on recumbent bike, due to shortness of breath, then does strength training. Vitamin D level normal at 49 in 02/2016   Other doctors caring for patient include: Dr. CrAllena Earingr. HeRogers Seedsr. Herrick--urology Dr. GaPenelope CoopGI Dr. TaMerceda Elks He wants to see a new dermatologist. He may switch to his partner. [facility] [address]--Dentist [facility].  Depression screen:negative Fall screen:none in the last year Functional status survey:has hearing aids, can't put on socks/compression stockings. Mini-Cog screen: normal See full screens in epic  End of Life Discussion: Patient hasa living will and medical power of attorney.He is Full Code, Full Care  PMH, PSH, SH and FH were reviewed and updated  Outpatient Encounter Medications as of 04/10/2020  Medication Sig  . acetaminophen (TYLENOL) 500 MG tablet Take 1,000 mg by mouth at bedtime as needed for mild pain.   Marland Kitchen atorvastatin (LIPITOR) 80 MG tablet TAKE 1 TABLET BY MOUTH EVERY EVENING  . CALCIUM-MAGNESIUM-ZINC PO Take 1 tablet by mouth at bedtime.   . cetirizine (ZYRTEC) 10 MG tablet Take 10 mg by mouth daily.  . Cholecalciferol (VITAMIN D) 2000 UNITS tablet Take 4,000 Units by mouth at bedtime.   . Coenzyme Q10 (CO Q-10) 100 MG CAPS Take 100 mg by mouth at bedtime.   .  DENTA 5000 PLUS 1.1 % CREA dental cream Take by mouth as directed.  . dofetilide (TIKOSYN) 500 MCG capsule TAKE 1 CAPSULE BY MOUTH TWICE A DAY  . ELIQUIS 5 MG TABS tablet TAKE 1 TABLET BY MOUTH TWICE A DAY  . fluticasone (FLONASE) 50 MCG/ACT nasal spray USE 2 SPRAYS NASALLY DAILY  . furosemide (LASIX) 40 MG tablet Take 2 tablets (80 mg total) by mouth daily.  Marland Kitchen losartan (COZAAR) 100 MG tablet TAKE 1 TABLET BY MOUTH EVERY DAY  . metFORMIN (GLUCOPHAGE-XR) 500 MG 24 hr tablet TAKE 2 TABLETS BY MOUTH EVERY DAY WITH BREAKFAST  . metoprolol tartrate (LOPRESSOR) 25 MG tablet TAKE 1 TABLET (25 MG TOTAL) BY MOUTH 2 (TWO) TIMES DAILY. KEEP OV.  . Multiple Vitamin (MULTIVITAMIN WITH MINERALS) TABS tablet Take 1 tablet by mouth daily. Centrum silver  . omeprazole (PRILOSEC) 20 MG capsule Take 20 mg by mouth every other day.   . albuterol (PROVENTIL HFA;VENTOLIN HFA) 108 (90 Base) MCG/ACT inhaler Inhale 2 puffs into the lungs every 6 (six) hours as needed for wheezing or shortness of breath. (Patient not taking: Reported on 04/10/2020)  . amoxicillin (AMOXIL) 500 MG capsule TAKE 4 CAPSULES BY MOUTH 1 HOUR PRE DENTAL APPOINTMENT (Patient not taking: Reported on 04/10/2020)  . nitroGLYCERIN (NITROSTAT) 0.4 MG SL tablet Place 1 tablet (0.4 mg total) under the tongue every 5 (five) minutes as needed for chest pain. (Patient not taking: Reported on 04/10/2020)   No facility-administered encounter medications on file as of 04/10/2020.   No Known Allergies  ROS: The patient denies anorexia, fever, headaches, ear pain, hoarseness, chest pain, palpitations, syncope,; denies nausea, vomiting, diarrhea, constipation, abdominal pain, melena, hematochezia, hematuria, incontinence, dysuria, numbness, tingling, weakness, tremor, depression, anxiety, abnormal bleeding/bruising, or enlarged lymph nodes. +allergies/PND, chronic, not flaring now. Swelling in bilateral feet--not really improved with higher lasix dose.  Uses  compression socks, which helps (doesn't wear daily) +hearing loss, wears hearing aids.  He has ringing in the L ear, developed in the last year or two, mentioned to audiologist. Double vision after reading for a long time, short-lived, unchanged (d/w eye doctor in the past). Some bilateral knee pain/stiffness, unchanged. No giving way or swelling.  Worse in the winter. +ED   PHYSICAL EXAM:  BP (!) 140/80   Pulse 60   Ht 5' 7"  (1.702 m)   Wt (!) 265 lb 9.6 oz (120.5 kg)   BMI 41.60 kg/m   Wt Readings from Last 3 Encounters:  04/10/20 (!) 265 lb 9.6 oz (120.5 kg)  01/17/20 268 lb 3.2 oz (121.7 kg)  07/19/19 277 lb 9.6 oz (125.9 kg)   General Appearance:  Alert, cooperative, no distress, appears stated age, obese   Head:  Normocephalic, without obvious abnormality, atraumatic   Eyes:  PERRL, conjunctiva/corneas clear, EOM's intact, fundi benign   Ears:  +hearing aids bilaterally.   Nose:  Not examined, wearing mask due to COVID-19 pandemic  Throat:  Not examined, wearing mask due to COVID-19 pandemic    Neck:  Supple, no lymphadenopathy; thyroid: no enlargement/tenderness/nodules; no carotid bruit or JVD   Back:  Spine nontender, no curvature, ROM normal, no CVA tenderness   Lungs:  Clear to auscultation bilaterally without wheezes, rales or ronchi; respirations unlabored   Chest Wall:  No tenderness or deformity   Heart:  Regular rate and rhythm. 3/6 SEM heard best at RUSB, no rub or gallop.   Breast Exam:  No chest wall tenderness, or masses   Abdomen:  Soft, obese, non-tender, nondistended, normoactive bowel sounds, no masses, no hepatosplenomegaly.  +diastasis recti, nontender  Genitalia:  Deferred to urologist.  Rectal:  Deferred to urologist.   Extremities:  No clubbing or cyanosis, 1-2+ edema bilaterally  Pulses:  2+ and symmetric all extremities   Skin:  Skin turgor normal, no rashes; some mild hyperpigmentation  present at BLE, consistent with venous stasis changes. Large hyperkeratotic lesion L medial knee, many others scattered on legs. AK's on scalp   Lymph nodes:  Cervical, supraclavicular, and axillary nodes normal   Neurologic:  normal strength, sensation and gait; reflexes 2+ and symmetric throughout      Psych:    Normal hygiene and grooming. He became tearful in discussing poor relationship with his daughter, unsure of what things his ex-wife told her to make her not want to have a relationship with him.  He has somewhat flattened affect, appears a little more depressed today. Normal speech, eye contact.  Diabetic foot exam normal   Lab Results  Component Value Date   HGBA1C 6.3 (A) 04/10/2020    ASSESSMENT/PLAN:  Annual physical exam  Medicare annual wellness visit, subsequent  Paroxysmal atrial fibrillation ([place]) - controlled on current regimen, per cardiology  Type 2 diabetes mellitus with retinopathy, without long-term current use of insulin, macular edema presence unspecified, unspecified laterality, unspecified retinopathy severity ([place]) - DM is controlled. Discussed diet, exercise, wt loss. Eye exams are UTD - Plan: HgB A1c, TSH, Microalbumin / creatinine urine ratio, Comprehensive metabolic panel  Obstructive sleep apnea - Cont CPAP use.  Wt loss encouraged  Mixed hyperlipidemia - due for recheck; LDL at goal last year, TG slightly elevated. Diet reviewed. Cont lipitor. consider fish oil (rx if >250) - Plan: Lipid panel  Essential hypertension - borderline/high.  Meds managed by cardiologist.  Encouraged daily exercise, wt loss  Coronary artery disease involving native coronary artery of native heart without angina pectoris  Hypertension associated with diabetes ([place])  Hyperlipidemia associated with type 2 diabetes mellitus ([place])  Prostate cancer ([place]) - discussed in detail, hesitant re: "seeds". Due for repeat bx, and then to listen and discuss with  urology.  History of embolic stroke - neuro stable, no recurrent problems.  Cont anticoag  Chronic diastolic congestive heart failure ([place]) - still with edema despite increased lasix. Low Na diet, exercise, compression socks. No orthopnea, lungs clear  DOE (dyspnea on exertion) - multifactorial, including poss COPD (former smoker), obesity, OSA.  Cardiac etiology not suspected. Trial albuterol prior to exercise bike. CXR to eval lungs - Plan: DG Chest 2 View, albuterol (VENTOLIN HFA) 108 (90 Base) MCG/ACT inhaler  Chronic cough - stable.  Check x-ray.  he is concerned about the humidifcation of CPAP worsening--to consider decreasing as trial - Plan: DG Chest 2 View  Need for hepatitis C screening test - Plan: Hepatitis C antibody  Anticoagulant long-term use - for h/o CVA and afib.  Denies bleeding/bruising - Plan:  CBC with Differential/Platelet  Medication monitoring encounter - Plan: CBC with Differential/Platelet, Comprehensive metabolic panel, Lipid panel  Actinic keratosis - on scalp and LE's. Advised to f/u with derm for treatment. h/o SCC and BCC  Elevated uric acid in blood - He has elevated uric acid, no joint pains. He would decline treatment even if elevated, so will not be rechecked today.  Class 3 severe obesity due to excess calories with serious comorbidity and body mass index (BMI) of 40.0 to 44.9 in adult [facility]) - counseled in detail re: diet, portions, exercise, risks of obesity and what [facility] clinic could offer. Not interested at this time.  He became tearful at end of visit, in discussing issues about poor relationship with one of his daughters. Discussed reaching out to there family members that are close with her. Consider formal counseling, since this is clearly bothering him, and things go back much further (related to prior marriage, etc), not a recent issue.   DOE:  Try albuterol prior to exercise [facility] Agreeable for CXR  Declines pulm referral.  To consider  if changes on CXR.   c-met, lipids, TSH, urine microalb, hep C  Recommended at least 30 minutes of aerobic activity at least 5 days/week, weight-bearing exercise at least 2x/wk; proper sunscreen use reviewed; healthy diet and alcohol recommendations (less than or equal to 2 drinks/day) reviewed; regular seatbelt use; changing batteries in smoke detectors. Immunization recommendations discussed--continue high dose flu shots yearly.Shingrix recommended, risks/side effects reviewed, to get from pharmacy.Colonoscopy recommendations reviewed--due now.  To contact Dr. Penelope Coop to schedule  MOST form reviewed.  Full Code, Full Care  FTF visit time was 75 minutes. Additional time spent in chart review and documentation.  ADDENDUM: TG much higher--start Vascepa LFT's much higher--?if from fatty liver from elevated TG and obesity; want evaluate liver with CT, also given recent dx of Prostate CA.   Medicare Attestation I have personally reviewed: The patient's medical and social history Their use of alcohol, tobacco or illicit drugs Their current medications and supplements The patient's functional ability including ADLs,fall risks, home safety risks, cognitive, and hearing and visual impairment Diet and physical activities Evidence for depression or mood disorders  The patient's weight, height, BMI have been recorded in the chart.  I have made referrals, counseling, and provided education to the patient based on review of the above and I have provided the patient with a written personalized care plan for preventive services.

## 2020-04-10 ENCOUNTER — Ambulatory Visit (INDEPENDENT_AMBULATORY_CARE_PROVIDER_SITE_OTHER): Payer: Medicare HMO | Admitting: Family Medicine

## 2020-04-10 ENCOUNTER — Encounter: Payer: Self-pay | Admitting: Family Medicine

## 2020-04-10 ENCOUNTER — Other Ambulatory Visit: Payer: Self-pay

## 2020-04-10 VITALS — BP 140/80 | HR 60 | Ht 67.0 in | Wt 265.6 lb

## 2020-04-10 DIAGNOSIS — Z7901 Long term (current) use of anticoagulants: Secondary | ICD-10-CM | POA: Diagnosis not present

## 2020-04-10 DIAGNOSIS — R053 Chronic cough: Secondary | ICD-10-CM

## 2020-04-10 DIAGNOSIS — E1169 Type 2 diabetes mellitus with other specified complication: Secondary | ICD-10-CM

## 2020-04-10 DIAGNOSIS — E1159 Type 2 diabetes mellitus with other circulatory complications: Secondary | ICD-10-CM | POA: Diagnosis not present

## 2020-04-10 DIAGNOSIS — Z5181 Encounter for therapeutic drug level monitoring: Secondary | ICD-10-CM

## 2020-04-10 DIAGNOSIS — Z0000 Encounter for general adult medical examination without abnormal findings: Secondary | ICD-10-CM

## 2020-04-10 DIAGNOSIS — G4733 Obstructive sleep apnea (adult) (pediatric): Secondary | ICD-10-CM

## 2020-04-10 DIAGNOSIS — E782 Mixed hyperlipidemia: Secondary | ICD-10-CM | POA: Diagnosis not present

## 2020-04-10 DIAGNOSIS — Z1159 Encounter for screening for other viral diseases: Secondary | ICD-10-CM | POA: Diagnosis not present

## 2020-04-10 DIAGNOSIS — E11319 Type 2 diabetes mellitus with unspecified diabetic retinopathy without macular edema: Secondary | ICD-10-CM

## 2020-04-10 DIAGNOSIS — L570 Actinic keratosis: Secondary | ICD-10-CM

## 2020-04-10 DIAGNOSIS — I2510 Atherosclerotic heart disease of native coronary artery without angina pectoris: Secondary | ICD-10-CM | POA: Diagnosis not present

## 2020-04-10 DIAGNOSIS — Z8673 Personal history of transient ischemic attack (TIA), and cerebral infarction without residual deficits: Secondary | ICD-10-CM

## 2020-04-10 DIAGNOSIS — I152 Hypertension secondary to endocrine disorders: Secondary | ICD-10-CM

## 2020-04-10 DIAGNOSIS — I480 Paroxysmal atrial fibrillation: Secondary | ICD-10-CM

## 2020-04-10 DIAGNOSIS — R7989 Other specified abnormal findings of blood chemistry: Secondary | ICD-10-CM

## 2020-04-10 DIAGNOSIS — R0609 Other forms of dyspnea: Secondary | ICD-10-CM

## 2020-04-10 DIAGNOSIS — E790 Hyperuricemia without signs of inflammatory arthritis and tophaceous disease: Secondary | ICD-10-CM

## 2020-04-10 DIAGNOSIS — C61 Malignant neoplasm of prostate: Secondary | ICD-10-CM | POA: Diagnosis not present

## 2020-04-10 DIAGNOSIS — E785 Hyperlipidemia, unspecified: Secondary | ICD-10-CM

## 2020-04-10 DIAGNOSIS — I5032 Chronic diastolic (congestive) heart failure: Secondary | ICD-10-CM | POA: Insufficient documentation

## 2020-04-10 DIAGNOSIS — I10 Essential (primary) hypertension: Secondary | ICD-10-CM

## 2020-04-10 DIAGNOSIS — R05 Cough: Secondary | ICD-10-CM

## 2020-04-10 DIAGNOSIS — Z6841 Body Mass Index (BMI) 40.0 and over, adult: Secondary | ICD-10-CM

## 2020-04-10 DIAGNOSIS — R0600 Dyspnea, unspecified: Secondary | ICD-10-CM

## 2020-04-10 LAB — POCT GLYCOSYLATED HEMOGLOBIN (HGB A1C): Hemoglobin A1C: 6.3 % — AB (ref 4.0–5.6)

## 2020-04-10 MED ORDER — ALBUTEROL SULFATE HFA 108 (90 BASE) MCG/ACT IN AERS: 2.0000 | INHALATION_SPRAY | Freq: Four times a day (QID) | RESPIRATORY_TRACT | 0 refills | Status: AC | PRN

## 2020-04-10 NOTE — Patient Instructions (Addendum)
  HEALTH MAINTENANCE RECOMMENDATIONS:  It is recommended that you get at least 30 minutes of aerobic exercise at least 5 days/week (for weight loss, you may need as much as 60-90 minutes). This can be any activity that gets your heart rate up. This can be divided in 10-15 minute intervals if needed, but try and build up your endurance at least once a week.  Weight bearing exercise is also recommended twice weekly.  Eat a healthy diet with lots of vegetables, fruits and fiber.  "Colorful" foods have a lot of vitamins (ie green vegetables, tomatoes, red peppers, etc).  Limit sweet tea, regular sodas and alcoholic beverages, all of which has a lot of calories and sugar.  Up to 2 alcoholic drinks daily may be beneficial for men (unless trying to lose weight, watch sugars).  Drink a lot of water.  Sunscreen of at least SPF 30 should be used on all sun-exposed parts of the skin when outside between the hours of 10 am and 4 pm (not just when at beach or pool, but even with exercise, golf, tennis, and yard work!)  Use a sunscreen that says "broad spectrum" so it covers both UVA and UVB rays, and make sure to reapply every 1-2 hours.  Remember to change the batteries in your smoke detectors when changing your clock times in the spring and fall. Carbon monoxide detectors are recommended for your home.  Use your seat belt every time you are in a car, and please drive safely and not be distracted with cell phones and texting while driving.    Mr. Meininger , Thank you for taking time to come for your Medicare Wellness Visit. I appreciate your ongoing commitment to your health goals. Please review the following plan we discussed and let me know if I can assist you in the future.   This is a list of the screening recommended for you and due dates:  Health Maintenance  Topic Date Due  .  Hepatitis C: One time screening is recommended by Center for Disease Control  (CDC) for  adults born from [birth year] through 1965.    Never done  . COVID-19 Vaccine (1) Never done  . Complete foot exam   12/25/2018  . Hemoglobin A1C  09/18/2019  . Colon Cancer Screening  02/01/2020  . Flu Shot  04/16/2020  . Eye exam for diabetics  04/10/2021  . Tetanus Vaccine  03/16/2022  . Pneumonia vaccines  Completed   Foot exam, A1c and Hep C screening were done today (age recommendations changed, now recommended for all adults). You are due for colonoscopy, contact Dr. Estell Harpin office to schedule.  Feel free to reach out to the CPAP supplier and ask questions about the humidification (ie trial of lower, to see if that helps with your chest congestion).  I recommend getting the new shingles vaccine (Shingrix). You will need to get this from the pharmacy rather than our office, as it is covered by Medicare Part D.  It is a series of 2 injections, spaced 2 months apart.  Try and get more cardio--NOT your stairs to the basement, but FLAT. You can also try just 10 mins on the bike, take a break for weights, and then do another 10 minutes.  Shorter intervals are absolutely fine.  I recommend that you try using the albuterol inhaler 20 minutes prior to getting on the exercise bike to see if that helps your breathing (and allows you to do more and feel better).

## 2020-04-11 LAB — CBC WITH DIFFERENTIAL/PLATELET
Basophils Absolute: 0 10*3/uL (ref 0.0–0.2)
Basos: 1 %
EOS (ABSOLUTE): 0.1 10*3/uL (ref 0.0–0.4)
Eos: 2 %
Hematocrit: 45.5 % (ref 37.5–51.0)
Hemoglobin: 15.4 g/dL (ref 13.0–17.7)
Immature Grans (Abs): 0 10*3/uL (ref 0.0–0.1)
Immature Granulocytes: 1 %
Lymphocytes Absolute: 1.8 10*3/uL (ref 0.7–3.1)
Lymphs: 22 %
MCH: 30.6 pg (ref 26.6–33.0)
MCHC: 33.8 g/dL (ref 31.5–35.7)
MCV: 90 fL (ref 79–97)
Monocytes Absolute: 0.8 10*3/uL (ref 0.1–0.9)
Monocytes: 9 %
Neutrophils Absolute: 5.4 10*3/uL (ref 1.4–7.0)
Neutrophils: 65 %
Platelets: 208 10*3/uL (ref 150–450)
RBC: 5.04 x10E6/uL (ref 4.14–5.80)
RDW: 17.8 % — ABNORMAL HIGH (ref 11.6–15.4)
WBC: 8.2 10*3/uL (ref 3.4–10.8)

## 2020-04-11 LAB — COMPREHENSIVE METABOLIC PANEL
ALT: 106 IU/L — ABNORMAL HIGH (ref 0–44)
AST: 72 IU/L — ABNORMAL HIGH (ref 0–40)
Albumin/Globulin Ratio: 1.5 (ref 1.2–2.2)
Albumin: 4.4 g/dL (ref 3.7–4.7)
Alkaline Phosphatase: 61 IU/L (ref 48–121)
BUN/Creatinine Ratio: 15 (ref 10–24)
BUN: 17 mg/dL (ref 8–27)
Bilirubin Total: 0.9 mg/dL (ref 0.0–1.2)
CO2: 26 mmol/L (ref 20–29)
Calcium: 10.4 mg/dL — ABNORMAL HIGH (ref 8.6–10.2)
Chloride: 99 mmol/L (ref 96–106)
Creatinine, Ser: 1.1 mg/dL (ref 0.76–1.27)
GFR calc Af Amer: 74 mL/min/{1.73_m2} (ref >59)
GFR calc non Af Amer: 64 mL/min/{1.73_m2} (ref >59)
Globulin, Total: 2.9 g/dL (ref 1.5–4.5)
Glucose: 108 mg/dL — ABNORMAL HIGH (ref 65–99)
Potassium: 4.8 mmol/L (ref 3.5–5.2)
Sodium: 143 mmol/L (ref 134–144)
Total Protein: 7.3 g/dL (ref 6.0–8.5)

## 2020-04-11 LAB — LIPID PANEL
Chol/HDL Ratio: 4.2 ratio (ref 0.0–5.0)
Cholesterol, Total: 193 mg/dL (ref 100–199)
HDL: 46 mg/dL (ref >39)
LDL Chol Calc (NIH): 99 mg/dL (ref 0–99)
Triglycerides: 282 mg/dL — ABNORMAL HIGH (ref 0–149)
VLDL Cholesterol Cal: 48 mg/dL — ABNORMAL HIGH (ref 5–40)

## 2020-04-11 LAB — MICROALBUMIN / CREATININE URINE RATIO
Creatinine, Urine: 56.3 mg/dL
Microalb/Creat Ratio: 20 mg/g creat (ref 0–29)
Microalbumin, Urine: 11.4 ug/mL

## 2020-04-11 LAB — HEPATITIS C ANTIBODY: Hep C Virus Ab: <0.1 s/co ratio (ref 0.0–0.9)

## 2020-04-11 LAB — TSH: TSH: 2.12 u[IU]/mL (ref 0.450–4.500)

## 2020-04-11 MED ORDER — ICOSAPENT ETHYL 1 G PO CAPS: 2.0000 g | ORAL_CAPSULE | Freq: Two times a day (BID) | ORAL | 2 refills | Status: DC

## 2020-04-11 NOTE — Addendum Note (Signed)
Addended byRita Ohara on: 04/11/2020 12:31 PM   Modules accepted: Orders

## 2020-04-13 ENCOUNTER — Other Ambulatory Visit: Payer: Self-pay | Admitting: *Deleted

## 2020-04-13 DIAGNOSIS — E782 Mixed hyperlipidemia: Secondary | ICD-10-CM

## 2020-04-13 DIAGNOSIS — Z5181 Encounter for therapeutic drug level monitoring: Secondary | ICD-10-CM

## 2020-04-13 DIAGNOSIS — R7989 Other specified abnormal findings of blood chemistry: Secondary | ICD-10-CM

## 2020-04-26 ENCOUNTER — Ambulatory Visit
Admission: RE | Admit: 2020-04-26 | Discharge: 2020-04-26 | Disposition: A | Discharge status: Home / Self Care | Payer: Medicare HMO | Source: Ambulatory Visit | Attending: Family Medicine | Admitting: Family Medicine

## 2020-04-26 ENCOUNTER — Other Ambulatory Visit: Payer: Self-pay

## 2020-04-26 DIAGNOSIS — K8020 Calculus of gallbladder without cholecystitis without obstruction: Secondary | ICD-10-CM | POA: Diagnosis not present

## 2020-04-26 DIAGNOSIS — K5730 Diverticulosis of large intestine without perforation or abscess without bleeding: Secondary | ICD-10-CM | POA: Diagnosis not present

## 2020-04-26 DIAGNOSIS — C61 Malignant neoplasm of prostate: Secondary | ICD-10-CM

## 2020-04-26 DIAGNOSIS — I700 Atherosclerosis of aorta: Secondary | ICD-10-CM | POA: Diagnosis not present

## 2020-04-26 DIAGNOSIS — N281 Cyst of kidney, acquired: Secondary | ICD-10-CM | POA: Diagnosis not present

## 2020-04-26 DIAGNOSIS — R7989 Other specified abnormal findings of blood chemistry: Secondary | ICD-10-CM

## 2020-04-26 MED ORDER — IOPAMIDOL (ISOVUE-300) INJECTION 61%
100.0000 mL | Freq: Once | INTRAVENOUS | Status: AC | PRN
  Administered 2020-04-26: 100 mL via INTRAVENOUS

## 2020-04-27 NOTE — Progress Notes (Signed)
Results d/w pt.  No abdominal pain. He has lost 10# since last visit.  He is having some constipation, unsure if from [place], held it for a few days, not really improving.  Discussed water, high fiber diet, exercise, and possibly adding miralax if needed.

## 2020-05-01 DIAGNOSIS — C61 Malignant neoplasm of prostate: Secondary | ICD-10-CM | POA: Diagnosis not present

## 2020-05-02 ENCOUNTER — Telehealth: Payer: Self-pay | Admitting: *Deleted

## 2020-05-02 NOTE — Telephone Encounter (Signed)
° °  [facility] Pre-operative Risk Assessment     Request for surgical clearance:  1. What type of surgery is being performed? Prostate ultrasound biopsy   2. When is this surgery scheduled? 05/19/20   3. What type of clearance is required (medical clearance vs. Pharmacy clearance to hold med vs. Both)? pharmacy  4. Are there any medications that need to be held prior to surgery and how long? Eliquis x 3 days   5. Practice name and name of physician performing surgery? [facility] Dr. Louis Meckel   6. What is the office phone number? [phone]   7.   What is the office fax number? [phone]  8.   Anesthesia type (None, local, MAC, general) ?    Jeffrey Wiggins Jeffrey Wiggins 05/02/2020, 5:04 PM  _________________________________________________________________   (provider comments below)

## 2020-05-03 NOTE — Telephone Encounter (Signed)
Patient with diagnosis of afib on Eliquis for anticoagulation.    Procedure: prostate ultrasound biopsy Date of procedure: 05/19/20  CHADS2-VASc score of 8 (age x2, CHF, HTN, DM, CAD, CVA)  CrCl 108mL/min using adjusted body weight Platelet count 208K  Prefer to minimize time off of anticoagulation as much as possible due to elevated cardiovascular risk including history of afib and stroke. Would prefer 1-2 day hold instead of requested 3 day hold, but will defer to MD for input.

## 2020-05-03 NOTE — Telephone Encounter (Signed)
Would hold apixaban 2 days prior to procedure and resume after when ok with surgery Kirk Ruths

## 2020-05-03 NOTE — Telephone Encounter (Signed)
   Primary Cardiologist: Kirk Ruths, MD  Chart reviewed as part of pre-operative protocol coverage. Given past medical history and time since last visit, based on ACC/AHA guidelines, Jeffrey Wiggins would be at acceptable risk for the planned procedure without further cardiovascular testing.   Patient with diagnosis of afib on Eliquis for anticoagulation.    Procedure: prostate ultrasound biopsy Date of procedure: 05/19/20  CHADS2-VASc score of 8 (age x2, CHF, HTN, DM, CAD, CVA)  CrCl 18mL/min using adjusted body weight Platelet count 208K  Prefer to minimize time off of anticoagulation as much as possible due to elevated cardiovascular risk including history of afib and stroke.  His apixaban may be held for 2 days prior to his procedure. Please resume as soon as hemostasis is achieved.  I will route this recommendation to the requesting party via Epic fax function and remove from pre-op pool.  Please call with questions.  Jossie Ng. Nahzir Pohle NP-C    05/03/2020, 11:00 AM [address] Office [phone] Fax [phone]

## 2020-05-19 ENCOUNTER — Other Ambulatory Visit: Payer: Self-pay | Admitting: Cardiology

## 2020-05-19 DIAGNOSIS — C61 Malignant neoplasm of prostate: Secondary | ICD-10-CM | POA: Diagnosis not present

## 2020-06-10 DIAGNOSIS — G4733 Obstructive sleep apnea (adult) (pediatric): Secondary | ICD-10-CM | POA: Diagnosis not present

## 2020-06-12 ENCOUNTER — Ambulatory Visit: Payer: Medicare HMO | Admitting: Dermatology

## 2020-06-12 ENCOUNTER — Encounter: Payer: Self-pay | Admitting: Dermatology

## 2020-06-12 ENCOUNTER — Other Ambulatory Visit: Payer: Self-pay

## 2020-06-12 DIAGNOSIS — Z85828 Personal history of other malignant neoplasm of skin: Secondary | ICD-10-CM | POA: Diagnosis not present

## 2020-06-12 DIAGNOSIS — C44329 Squamous cell carcinoma of skin of other parts of face: Secondary | ICD-10-CM | POA: Diagnosis not present

## 2020-06-12 DIAGNOSIS — C4442 Squamous cell carcinoma of skin of scalp and neck: Secondary | ICD-10-CM | POA: Diagnosis not present

## 2020-06-12 DIAGNOSIS — C4492 Squamous cell carcinoma of skin, unspecified: Secondary | ICD-10-CM

## 2020-06-12 DIAGNOSIS — D0439 Carcinoma in situ of skin of other parts of face: Secondary | ICD-10-CM

## 2020-06-12 DIAGNOSIS — D485 Neoplasm of uncertain behavior of skin: Secondary | ICD-10-CM

## 2020-06-12 DIAGNOSIS — L570 Actinic keratosis: Secondary | ICD-10-CM | POA: Diagnosis not present

## 2020-06-12 DIAGNOSIS — Z1283 Encounter for screening for malignant neoplasm of skin: Secondary | ICD-10-CM

## 2020-06-12 HISTORY — DX: Squamous cell carcinoma of skin, unspecified: C44.92

## 2020-06-12 NOTE — Progress Notes (Addendum)
LN2 x 1 on left leg

## 2020-06-12 NOTE — Patient Instructions (Signed)

## 2020-06-16 ENCOUNTER — Telehealth: Payer: Self-pay | Admitting: *Deleted

## 2020-06-16 NOTE — Telephone Encounter (Signed)
Patient left message on office voice mail returning call about pathology results.

## 2020-06-16 NOTE — Telephone Encounter (Signed)
Path to patient made appointment with Dr. Denna Haggard to have spots treated.

## 2020-06-16 NOTE — Telephone Encounter (Signed)
-----   Message from Lavonna Monarch, MD sent at 06/16/2020  7:17 AM EDT ----- Please schedule as last a.m. or p.m. surgery in Dr. Darene Lamer will decide with Mr. Morandi what to treat at that time

## 2020-06-16 NOTE — Telephone Encounter (Signed)
Left message for patient to call back  

## 2020-06-26 ENCOUNTER — Ambulatory Visit: Payer: Medicare HMO | Admitting: Family Medicine

## 2020-06-27 ENCOUNTER — Other Ambulatory Visit: Payer: Self-pay | Admitting: Cardiology

## 2020-06-28 NOTE — Telephone Encounter (Signed)
Rx request sent to pharmacy.  

## 2020-06-29 ENCOUNTER — Other Ambulatory Visit: Payer: Self-pay | Admitting: Cardiology

## 2020-06-29 ENCOUNTER — Other Ambulatory Visit: Payer: Self-pay

## 2020-06-29 ENCOUNTER — Other Ambulatory Visit (INDEPENDENT_AMBULATORY_CARE_PROVIDER_SITE_OTHER): Payer: Medicare HMO

## 2020-06-29 DIAGNOSIS — Z23 Encounter for immunization: Secondary | ICD-10-CM

## 2020-07-09 ENCOUNTER — Other Ambulatory Visit: Payer: Self-pay | Admitting: Family Medicine

## 2020-07-09 DIAGNOSIS — E782 Mixed hyperlipidemia: Secondary | ICD-10-CM

## 2020-07-10 DIAGNOSIS — G4733 Obstructive sleep apnea (adult) (pediatric): Secondary | ICD-10-CM | POA: Diagnosis not present

## 2020-07-10 NOTE — Telephone Encounter (Signed)
Pt has labs on 10/27

## 2020-07-11 NOTE — Progress Notes (Signed)
HPI: FU coronary artery disease, PAF,hypertension, and hyperlipidemia. Cath 7/15 with severe CAD and EF 50. He underwent bypass grafting x2 with LIMA to the LAD and vein graft to the first obtuse marginal branch of the left circumflex. Abd ultrasound 9/15 showed no aneurysm. Also with h/o aflutter, PAF and CVA. Nuclear study 1/18 showed EF 38, inferior MI and mild peri-infarct ischemia. Carotid dopplers 1/18 showed 1-39 bilateral stenosis.Echocardiogram November 2020 showed ejection fraction 45 to 50%, moderate left ventricular hypertrophy, grade 2 diastolic dysfunction and moderate aortic stenosis (mean gradient measured at 12.5 mmHg and aortic valve area 1.2 cm). Since last seen,he has some dyspnea on exertion.  No orthopnea, PND, chest pain, palpitations or syncope.  No bleeding.  Chronic minimal pedal edema.  Current Outpatient Medications  Medication Sig Dispense Refill  . acetaminophen (TYLENOL) 500 MG tablet Take 1,000 mg by mouth at bedtime as needed for mild pain.     Marland Kitchen albuterol (VENTOLIN HFA) 108 (90 Base) MCG/ACT inhaler Inhale 2 puffs into the lungs every 6 (six) hours as needed for wheezing or shortness of breath. 18 g 0  . amoxicillin (AMOXIL) 500 MG capsule TAKE 4 CAPSULES BY MOUTH 1 HOUR PRE DENTAL APPOINTMENT  1  . atorvastatin (LIPITOR) 80 MG tablet TAKE 1 TABLET BY MOUTH EVERY EVENING 90 tablet 1  . CALCIUM-MAGNESIUM-ZINC PO Take 1 tablet by mouth at bedtime.     . cetirizine (ZYRTEC) 10 MG tablet Take 10 mg by mouth daily.    . Cholecalciferol (VITAMIN D) 2000 UNITS tablet Take 4,000 Units by mouth at bedtime.     . Coenzyme Q10 (CO Q-10) 100 MG CAPS Take 100 mg by mouth at bedtime.     . DENTA 5000 PLUS 1.1 % CREA dental cream Take by mouth as directed.    . dofetilide (TIKOSYN) 500 MCG capsule TAKE 1 CAPSULE BY MOUTH TWICE A DAY 180 capsule 3  . ELIQUIS 5 MG TABS tablet TAKE 1 TABLET BY MOUTH TWICE A DAY 180 tablet 1  . fluticasone (FLONASE) 50 MCG/ACT nasal  spray USE 2 SPRAYS NASALLY DAILY 48 g 1  . furosemide (LASIX) 40 MG tablet Take 2 tablets (80 mg total) by mouth daily. 180 tablet 3  . icosapent Ethyl (VASCEPA) 1 g capsule Take 2 capsules (2 g total) by mouth 2 (two) times daily. 120 capsule 2  . losartan (COZAAR) 100 MG tablet TAKE 1 TABLET BY MOUTH EVERY DAY 90 tablet 2  . metFORMIN (GLUCOPHAGE-XR) 500 MG 24 hr tablet TAKE 2 TABLETS BY MOUTH EVERY DAY WITH BREAKFAST 180 tablet 1  . metoprolol tartrate (LOPRESSOR) 25 MG tablet Take 1 tablet (25 mg total) by mouth 2 (two) times daily. 180 tablet 1  . Multiple Vitamin (MULTIVITAMIN WITH MINERALS) TABS tablet Take 1 tablet by mouth daily. Centrum silver    . nitroGLYCERIN (NITROSTAT) 0.4 MG SL tablet Place 1 tablet (0.4 mg total) under the tongue every 5 (five) minutes as needed for chest pain. 25 tablet 12  . omeprazole (PRILOSEC) 20 MG capsule Take 20 mg by mouth every other day.      No current facility-administered medications for this visit.     Past Medical History:  Diagnosis Date  . Adenomatous colon polyp 01/2015   Dr. Penelope Coop  . Allergy    RHINITIS  . Arthritis   . Atrial fibrillation ([place])   . Basal cell carcinoma 08/14/2016   mid forhead   . Benign prostatic hypertrophy  followed by Dr. Gaynelle Arabian; regrowth R lat lob (11/2015)  . CAD (coronary artery disease)   . Cataracts, bilateral   . Cerebrovascular disease   . Diverticular disease   . Diverticulosis    seen on colonoscopy (9/05, 06/2009)  . Erectile dysfunction   . Family history of adverse reaction to anesthesia    mother had memeory problems after anesthesia with hip replacement, lasted until she died 2 to 3 years later  . Gallstones   . GERD (gastroesophageal reflux disease)   . 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 Hepatitis age 23   "called yellow jaundice"  . HTN (hypertension)   . Hyperlipidemia   . Hyperplastic colon polyp 05/2004   tubular adenoma 01/2015  . Hypogonadism male    treated  by Dr. Gaynelle Arabian  . Myocardial infarction [facility]) '98, '09   (Dr. Stanford Breed); s/p stents  . Nocturia   . Obesity   . Peripheral vascular disease ([place])    02/28/12 MOST RECENT CAROTID DUPLEX--"STABLE MILD CALCIFIED PLAQUE, BILATERALY, STABLE, OVER SERIAL EXAMS"  . Pneumonia age 56   hx of  . Prostatitis   . 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 DIFF SCC MOHS  . 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 SCC IN SITU  . Sepsis due to Escherichia coli [facility]) ? 2011   DUE TO URINARY OUTLET OBSTRUCTION /INFECTION  . Shortness of breath dyspnea    with exertion only  . Sleep apnea    mild, CPAP wasn't indicated (per pt)--2014 mod-severe, on CPAP since 07/2013  . Stroke [facility]) jan 2016  . UTI (urinary tract infection)    secondary  . Visit for monitoring Tikosyn therapy     Past Surgical History:  Procedure Laterality Date  . CORONARY ANGIOPLASTY WITH STENT PLACEMENT  01/1997, stent x 1, 2009 2nd stent in same location   PTCA/stent to RCA  AND 2009 2ND STENT PLACED  . CORONARY ARTERY BYPASS GRAFT N/A 04/15/2014   Procedure: CORONARY ARTERY BYPASS GRAFTING TIMES TWO USEING LEFT INTERNAL MAMMARY ARTERY AND RIGHT GREATER SAPHENOUS VEIN VIA ENDOVEIN HARVEST;  Surgeon: Rexene Alberts, MD;  Location: [place];  Service: Open Heart Surgery;  Laterality: N/A;  . CYSTOSCOPY  11/17/15   Tannenbaum  . GREEN LIGHT LASER TURP (TRANSURETHRAL RESECTION OF PROSTATE N/A 01/15/2016   Procedure: GREEN LIGHT LASER TURP (TRANSURETHRAL RESECTION OF PROSTATE;  Surgeon: Carolan Clines, MD;  Location: [facility];  Service: Urology;  Laterality: N/A;  LASER RIGHT LATERAL LOBE OF PROSTATE AND LEFT BLADDER NECK    . INTRAOPERATIVE TRANSESOPHAGEAL ECHOCARDIOGRAM N/A 04/15/2014   Procedure: INTRAOPERATIVE TRANSESOPHAGEAL ECHOCARDIOGRAM;  Surgeon: Rexene Alberts, MD;  Location: [place];  Service:  Open Heart Surgery;  Laterality: N/A;  . JOINT REPLACEMENT     both knees  . KNEE SURGERY  05/2010   removing scar tissue R knee (Dr. Wynelle Link)  . LEFT AND RIGHT HEART CATHETERIZATION WITH CORONARY ANGIOGRAM  04/13/2014   Procedure: LEFT AND RIGHT HEART CATHETERIZATION WITH CORONARY ANGIOGRAM;  Surgeon: Sinclair Grooms, MD;  Location[facility] CATH LAB;  Service: Cardiovascular;;  . TONSILLECTOMY    . TOTAL KNEE ARTHROPLASTY  L 4/06, R 4/07  . TOTAL KNEE REVISION  09/23/2012   Procedure: TOTAL KNEE REVISION;  Surgeon: Dione Plover  Aluisio, MD; right side  Location: [facility];  Service: Orthopedics;  Laterality: Right;  . TRANSURETHRAL RESECTION OF PROSTATE  06/2010  . VASECTOMY      Social History   Socioeconomic History  . Marital status: Married    Spouse name: Not on file  . Number of children: 4  . Years of education: Not on file  . Highest education level: Not on file  Occupational History  . Occupation: Engineer, maintenance (IT)  Tobacco Use  . Smoking status: Former Smoker    Packs/day: 1.50    Years: 30.00    Pack years: 45.00    Types: Cigarettes    Quit date: 09/16/1986    Years since quitting: 33.8  . Smokeless tobacco: Never Used  . Tobacco comment: smoked 1-3 PPD from age 56 to 55  Vaping Use  . Vaping Use: Never used  Substance and Sexual Activity  . Alcohol use: Yes    Alcohol/week: 1.0 standard drink    Types: 1 Glasses of wine per week    Comment: 1 glass of wine 2-3 times a week  . Drug use: No  . Sexual activity: Not Currently  Other Topics Concern  . Not on file  Social History Narrative   Lives with wife.  4 kids, 1 stepchild (1 in [place], 4 in [place]). 11 grandchildren, 3 great grandchild   No pets. Wife is a diabetic.   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 Difficulty of Paying Living Expenses: Not on file  Food Insecurity:   . Worried About Charity fundraiser in the Last  Year: Not on file  . Ran Out of Food in the Last Year: Not on file  Transportation Needs:   . Lack of Transportation (Medical): Not on file  . Lack of Transportation (Non-Medical): Not on file  Physical Activity:   . Days of Exercise per Week: Not on file  . Minutes of Exercise per Session: Not on file  Stress:   . Feeling of Stress : Not on file  Social Connections:   . Frequency of Communication with Friends and Family: Not on file  . Frequency of Social Gatherings with Friends and Family: Not on file  . Attends Religious Services: Not on file  . Active Member of Clubs or Organizations: Not on file  . Attends Archivist Meetings: Not on file  . Marital Status: Not on file  Intimate Partner Violence:   . Fear of Current or Ex-Partner: Not on file  . Emotionally Abused: Not on file  . Physically Abused: Not on file  . Sexually Abused: Not on file    Family History  Problem Relation Age of Onset  . Coronary artery disease Mother        deceased  . Dementia Mother   . Atrial fibrillation Mother   . Stroke Mother        related to atrial fib  . Coronary artery disease Father        deceased  . Hypertension Father   . Heart disease Father   . Hypertension Paternal Grandmother   . Heart disease Paternal Grandmother   . Hypertension Paternal Grandfather   . Heart disease Paternal Grandfather   . Hypothyroidism Daughter        congenital  . Cancer Paternal Uncle        colon; died in his 63's  .  Colon cancer Paternal Uncle   . Diabetes Neg Hx     ROS: no fevers or chills, productive cough, hemoptysis, dysphasia, odynophagia, melena, hematochezia, dysuria, hematuria, rash, seizure activity, orthopnea, PND, pedal edema, claudication. Remaining systems are negative.  Physical Exam: Well-developed well-nourished in no acute distress.  Skin is warm and dry.  HEENT is normal.  Neck is supple.  Chest is clear to auscultation with normal expansion.  Cardiovascular  exam is regular rate and rhythm.  3/6 systolic murmur left sternal border.  S2 is not diminished. Abdominal exam nontender or distended. No masses palpated. Extremities show no edema. neuro grossly intact  ECG-sinus bradycardia at a rate of 59, cannot rule out septal infarct, normal QT interval.  First-degree AV block.  Personally reviewed  A/P  1 coronary artery disease status post coronary artery bypass graft-patient doing well with no chest pain.  Continue medical therapy.  We will continue statin.  He is not on aspirin given need for apixaban.  2 paroxysmal atrial fibrillation-patient remains in sinus rhythm.  Continue Tikosyn, beta-blocker and apixaban.  Check hemoglobin, renal function and magnesium.  3 chronic diastolic congestive heart failure-he appears to be doing reasonably well from a volume standpoint.  Continue Lasix at present dose.  Check potassium and renal function.  4 aortic stenosis-we will arrange follow-up echocardiogram.  5 hypertension-blood pressure controlled.  Continue present medications and follow.  6 hyperlipidemia-continue statin.  LDL not at goal on recent lipids.  Add Zetia 10 mg daily.  Check lipids and liver in 12 weeks.  7 mild carotid artery disease-continue statin.  8 sleep apnea-continue CPAP.  Kirk Ruths, MD

## 2020-07-12 ENCOUNTER — Other Ambulatory Visit: Payer: Self-pay

## 2020-07-12 ENCOUNTER — Other Ambulatory Visit: Payer: Medicare HMO

## 2020-07-12 DIAGNOSIS — R7989 Other specified abnormal findings of blood chemistry: Secondary | ICD-10-CM

## 2020-07-12 DIAGNOSIS — E782 Mixed hyperlipidemia: Secondary | ICD-10-CM

## 2020-07-12 DIAGNOSIS — Z5181 Encounter for therapeutic drug level monitoring: Secondary | ICD-10-CM

## 2020-07-12 NOTE — Progress Notes (Signed)
   New Patient   Subjective  Jeffrey Wiggins is a 79 y.o. male who presents for the following: Annual Exam (X4 WARTS? ON LEGS , SCALP CRUST).  Skin exam Location: New growing crust on scalp Duration:  Quality:  Associated Signs/Symptoms: Modifying Factors:  Severity:  Timing: Context: History of skin cancers   The following portions of the chart were reviewed this encounter and updated as appropriate: Tobacco  Allergies  Meds  Problems  Med Hx  Surg Hx  Fam Hx      Objective  Well appearing patient in no apparent distress; mood and affect are within normal limits.  A full examination was performed including scalp, head, eyes, ears, nose, lips, neck, chest, axillae, abdomen, back, buttocks, bilateral upper extremities, bilateral lower extremities, hands, feet, fingers, toes, fingernails, and toenails. All findings within normal limits unless otherwise noted below.   Assessment & Plan  Neoplasm of uncertain behavior of skin (3) Mid Occipital Scalp  Skin / nail biopsy Type of biopsy: tangential   Informed consent: discussed and consent obtained   Timeout: patient name, date of birth, surgical site, and procedure verified   Anesthesia: the lesion was anesthetized in a standard fashion   Anesthetic:  1% lidocaine w/ epinephrine 1-100,000 local infiltration Instrument used: flexible razor blade   Hemostasis achieved with: ferric subsulfate   Outcome: patient tolerated procedure well   Post-procedure details: sterile dressing applied and wound care instructions given   Dressing type: bandage and petrolatum    Specimen 1 - Surgical pathology Differential Diagnosis: BCC SCC Check Margins: No  Right Forehead  Skin / nail biopsy Type of biopsy: tangential   Informed consent: discussed and consent obtained   Timeout: patient name, date of birth, surgical site, and procedure verified   Anesthesia: the lesion was anesthetized in a standard fashion   Anesthetic:  1%  lidocaine w/ epinephrine 1-100,000 local infiltration Instrument used: flexible razor blade   Hemostasis achieved with: ferric subsulfate   Outcome: patient tolerated procedure well   Post-procedure details: sterile dressing applied and wound care instructions given   Dressing type: bandage and petrolatum    Specimen 2 - Surgical pathology Differential Diagnosis: BCC SCC Check Margins: No  Mid Forehead  Skin / nail biopsy Type of biopsy: tangential   Informed consent: discussed and consent obtained   Timeout: patient name, date of birth, surgical site, and procedure verified   Anesthesia: the lesion was anesthetized in a standard fashion   Anesthetic:  1% lidocaine w/ epinephrine 1-100,000 local infiltration Instrument used: flexible razor blade   Hemostasis achieved with: ferric subsulfate   Outcome: patient tolerated procedure well   Post-procedure details: sterile dressing applied and wound care instructions given   Dressing type: bandage and petrolatum    Specimen 3 - Surgical pathology Differential Diagnosis: BCC SCC Check Margins: No  AK (actinic keratosis) Left Knee - Anterior  Destruction of lesion - Left Knee - Anterior Complexity: simple   Destruction method: cryotherapy   Informed consent: discussed and consent obtained   Lesion destroyed using liquid nitrogen: Yes   Cryotherapy cycles:  5 Outcome: patient tolerated procedure well with no complications    Encounter for screening for malignant neoplasm of skin Mid Back  Yearly skin exams.

## 2020-07-13 ENCOUNTER — Other Ambulatory Visit: Payer: Self-pay | Admitting: *Deleted

## 2020-07-13 DIAGNOSIS — E782 Mixed hyperlipidemia: Secondary | ICD-10-CM

## 2020-07-13 LAB — HEPATIC FUNCTION PANEL
ALT: 53 IU/L — ABNORMAL HIGH (ref 0–44)
AST: 34 IU/L (ref 0–40)
Albumin: 4 g/dL (ref 3.7–4.7)
Alkaline Phosphatase: 56 IU/L (ref 44–121)
Bilirubin Total: 0.5 mg/dL (ref 0.0–1.2)
Bilirubin, Direct: 0.15 mg/dL (ref 0.00–0.40)
Total Protein: 6.4 g/dL (ref 6.0–8.5)

## 2020-07-13 LAB — LIPID PANEL
Chol/HDL Ratio: 4 ratio (ref 0.0–5.0)
Cholesterol, Total: 169 mg/dL (ref 100–199)
HDL: 42 mg/dL (ref >39)
LDL Chol Calc (NIH): 97 mg/dL (ref 0–99)
Triglycerides: 175 mg/dL — ABNORMAL HIGH (ref 0–149)
VLDL Cholesterol Cal: 30 mg/dL (ref 5–40)

## 2020-07-13 MED ORDER — ICOSAPENT ETHYL 1 G PO CAPS: 2.0000 g | ORAL_CAPSULE | Freq: Two times a day (BID) | ORAL | 2 refills | Status: DC

## 2020-07-14 ENCOUNTER — Ambulatory Visit
Admission: RE | Admit: 2020-07-14 | Discharge: 2020-07-14 | Disposition: A | Discharge status: Home / Self Care | Payer: Medicare HMO | Source: Ambulatory Visit | Attending: Family Medicine | Admitting: Family Medicine

## 2020-07-14 ENCOUNTER — Other Ambulatory Visit: Payer: Self-pay

## 2020-07-14 DIAGNOSIS — R053 Chronic cough: Secondary | ICD-10-CM

## 2020-07-14 DIAGNOSIS — R0600 Dyspnea, unspecified: Secondary | ICD-10-CM

## 2020-07-14 DIAGNOSIS — R0609 Other forms of dyspnea: Secondary | ICD-10-CM

## 2020-07-14 DIAGNOSIS — R059 Cough, unspecified: Secondary | ICD-10-CM | POA: Diagnosis not present

## 2020-07-15 ENCOUNTER — Encounter: Payer: Self-pay | Admitting: Dermatology

## 2020-07-17 ENCOUNTER — Other Ambulatory Visit: Payer: Self-pay

## 2020-07-17 ENCOUNTER — Encounter: Payer: Self-pay | Admitting: Cardiology

## 2020-07-17 ENCOUNTER — Ambulatory Visit: Payer: Self-pay | Admitting: Family Medicine

## 2020-07-17 ENCOUNTER — Ambulatory Visit: Payer: Medicare HMO | Admitting: Cardiology

## 2020-07-17 VITALS — BP 126/82 | HR 71 | Ht 67.0 in | Wt 256.2 lb

## 2020-07-17 DIAGNOSIS — E785 Hyperlipidemia, unspecified: Secondary | ICD-10-CM | POA: Diagnosis not present

## 2020-07-17 DIAGNOSIS — I10 Essential (primary) hypertension: Secondary | ICD-10-CM

## 2020-07-17 DIAGNOSIS — I5032 Chronic diastolic (congestive) heart failure: Secondary | ICD-10-CM

## 2020-07-17 DIAGNOSIS — I480 Paroxysmal atrial fibrillation: Secondary | ICD-10-CM | POA: Diagnosis not present

## 2020-07-17 DIAGNOSIS — I2510 Atherosclerotic heart disease of native coronary artery without angina pectoris: Secondary | ICD-10-CM

## 2020-07-17 DIAGNOSIS — I350 Nonrheumatic aortic (valve) stenosis: Secondary | ICD-10-CM

## 2020-07-17 MED ORDER — EZETIMIBE 10 MG PO TABS: 10.0000 mg | ORAL_TABLET | Freq: Every day | ORAL | 3 refills | Status: AC

## 2020-07-17 NOTE — Patient Instructions (Signed)
Medication Instructions:   START EZETIMIBE 10 MG ONCE DAILY  *If you need a refill on your cardiac medications before your next appointment, please call your pharmacy*   Lab Work:  Your physician recommends that you return for lab work in: [place]  If you have labs (blood work) drawn today and your tests are completely normal, you will receive your results only by: Marland Kitchen MyChart Message (if you have MyChart) OR . A paper copy in the mail If you have any lab test that is abnormal or we need to change your treatment, we will call you to review the results.   Testing/Procedures:  Your physician has requested that you have an echocardiogram. Echocardiography is a painless test that uses sound waves to create images of your heart. It provides your doctor with information about the size and shape of your heart and how well your heart's chambers and valves are working. This procedure takes approximately one hour. There are no restrictions for this procedure.[place]     Follow-Up: At [facility], you and your health needs are our priority.  As part of our continuing mission to provide you with exceptional heart care, we have created designated Provider Care Teams.  These Care Teams include your primary Cardiologist (physician) and Advanced Practice Providers (APPs -  Physician Assistants and Nurse Practitioners) who all work together to provide you with the care you need, when you need it.  We recommend signing up for the patient portal called "MyChart".  Sign up information is provided on this After Visit Summary.  MyChart is used to connect with patients for Virtual Visits (Telemedicine).  Patients are able to view lab/test results, encounter notes, upcoming appointments, etc.  Non-urgent messages can be sent to your provider as well.   To learn more about what you can do with MyChart, go to [email or web address].    Your next appointment:   6 month(s)  The format  for your next appointment:   In Person  Provider:   Kirk Ruths, MD

## 2020-07-18 NOTE — Progress Notes (Signed)
Chief Complaint  Patient presents with  . Follow-up    on multiple issues. Has not seen urologist-he said he is not due at this time.     Patient presents for 3 month follow-up.  At his last visit (03/2020) he was noted to have elevated LFT's, and elevated TG (282). Vascepa was added to his lipitor.   He had repeat labs prior to his visit. LFT's improved (ALT still slightly elevated); TG improved to 175.  He admits that sometimes he may forget a dose. LDL was not at goal. Dr. Stanford Breed just added Zetia at his visit this week. He is to have labs rechecked with cardiology in 12 weeks. Lab Results  Component Value Date   ALT 53 (H) 07/12/2020   AST 34 07/12/2020   ALKPHOS 56 07/12/2020   BILITOT 0.5 07/12/2020   Lab Results  Component Value Date   CHOL 169 07/12/2020   HDL 42 07/12/2020   LDLCALC 97 07/12/2020   TRIG 175 (H) 07/12/2020   CHOLHDL 4.0 07/12/2020   At his last visit he was complaining of DOE. This was being evaluated by his cardiologist, whom he saw in f/u yesterday. He has known chronic diastolic CHF.  His volume status is controlled with his current dosage of lasix. He tries to follow low Na diet. Denies orthopnea, his edema is much better.  His breathing has improved, less DOE.  Last echo was 07/2019, EF 45-50%.  He had mild-moderate aortic stenosis.  He is being scheduled for a f/u echocardiogram later this month.  They had mentioned possibly needing surgery. He would prefer something minimally invasive, NOT wanting to be in the hospital for long (bad prior experiences).  In July we had discussed the possibility of COPD contributing to his DOE, and recommended that he get CXR to evaluate his lungs for COPD or other abnormalities which can contribute to DOE, and also recommended a trial of using albuterol inhaler prior to activity (ie riding exercise bike). He had declined referral to pulmonary clinic. Last spirometry was in 2018, showing moderately severe restriction. He  only went for the CXR on 10/29, which was normal: FINDINGS: Prior CABG. Heart is normal size. Lungs are clear. No effusions or acute bony abnormality. IMPRESSION: No active cardiopulmonary disease.  His DOE has improved--unsure if due to weight loss, improved fluid status with much less swelling, and he has also been using albuterol prior to exercise--does think this helps some. He has been walking a lot (watching grandkids sports, which just ended).  He saw Dr. Denna Haggard in September, had 3 areas biopsied on forehead/scalp, all returned SCC.  Additional treatments planned.  Prostate cancer:  He saw urologist for f/u in August. Last note received was reviewed. Apparently at the August visit he didn't realize he was to have repeat biopsy at that time, had been taking Eliquis. His PSA values improved after stopping testosterone treatment. He reports he had a second biopsy; he states results were better. He reports plan is to follow PSA to decide if additional biopsy is needed. Will request records (to get biopsy results).   PMH, PSH, SH reviewed  Outpatient Encounter Medications as of 07/19/2020  Medication Sig  . albuterol (VENTOLIN HFA) 108 (90 Base) MCG/ACT inhaler Inhale 2 puffs into the lungs every 6 (six) hours as needed for wheezing or shortness of breath.  Marland Kitchen atorvastatin (LIPITOR) 80 MG tablet TAKE 1 TABLET BY MOUTH EVERY EVENING  . CALCIUM-MAGNESIUM-ZINC PO Take 1 tablet by mouth at bedtime.   Marland Kitchen  cetirizine (ZYRTEC) 10 MG tablet Take 10 mg by mouth daily.  . Cholecalciferol (VITAMIN D) 2000 UNITS tablet Take 4,000 Units by mouth at bedtime.   . Coenzyme Q10 (CO Q-10) 100 MG CAPS Take 100 mg by mouth at bedtime.   . DENTA 5000 PLUS 1.1 % CREA dental cream Take by mouth as directed.  . dofetilide (TIKOSYN) 500 MCG capsule TAKE 1 CAPSULE BY MOUTH TWICE A DAY  . ELIQUIS 5 MG TABS tablet TAKE 1 TABLET BY MOUTH TWICE A DAY  . ezetimibe (ZETIA) 10 MG tablet Take 1 tablet (10 mg total) by  mouth daily.  . fluticasone (FLONASE) 50 MCG/ACT nasal spray USE 2 SPRAYS NASALLY DAILY  . furosemide (LASIX) 40 MG tablet Take 2 tablets (80 mg total) by mouth daily.  Marland Kitchen icosapent Ethyl (VASCEPA) 1 g capsule Take 2 capsules (2 g total) by mouth 2 (two) times daily.  Marland Kitchen losartan (COZAAR) 100 MG tablet TAKE 1 TABLET BY MOUTH EVERY DAY  . metFORMIN (GLUCOPHAGE-XR) 500 MG 24 hr tablet TAKE 2 TABLETS BY MOUTH EVERY DAY WITH BREAKFAST  . metoprolol tartrate (LOPRESSOR) 25 MG tablet Take 1 tablet (25 mg total) by mouth 2 (two) times daily.  . Multiple Vitamin (MULTIVITAMIN WITH MINERALS) TABS tablet Take 1 tablet by mouth daily. Centrum silver  . omeprazole (PRILOSEC) 20 MG capsule Take 20 mg by mouth every other day.   Marland Kitchen acetaminophen (TYLENOL) 500 MG tablet Take 1,000 mg by mouth at bedtime as needed for mild pain.  (Patient not taking: Reported on 07/19/2020)  . amoxicillin (AMOXIL) 500 MG capsule TAKE 4 CAPSULES BY MOUTH 1 HOUR PRE DENTAL APPOINTMENT (Patient not taking: Reported on 07/19/2020)  . nitroGLYCERIN (NITROSTAT) 0.4 MG SL tablet Place 1 tablet (0.4 mg total) under the tongue every 5 (five) minutes as needed for chest pain. (Patient not taking: Reported on 07/19/2020)   No facility-administered encounter medications on file as of 07/19/2020.   No Known Allergies   ROS: no fever, chills, URI symptoms.  Chronic cough, unchanged. +DOE per HPI, improving. No chest pain, palpitations.   Edema has improved, shoes fit better. He has lost weight in his abdomen as well, pants fit better. No GI or GU complaints. Decrease in libido since stopping testosterone, some overall less energy, but tolerable. No bleeding, bruising, rash +14# weight loss since July visit.  PHYSICAL EXAM:  BP 122/72   Pulse 60   Ht 5\' 7"  (1.702 m)   Wt 251 lb 6.4 oz (114 kg)   BMI 39.37 kg/m   Wt Readings from Last 3 Encounters:  07/17/20 256 lb 3.2 oz (116.2 kg)  04/10/20 (!) 265 lb 9.6 oz (120.5 kg)  01/17/20  268 lb 3.2 oz (121.7 kg)   Pleasant male, in better spirits today (though can be easily irritable, spirits were good today) HEENT: conjunctiva and sclera are clear, EOMI, wearing mask Neck: no lymphadenopathy or mass Heart: regular rate and rhythm. 3/6 SEM heard throughout Lungs: clear bilaterally Back: no spinal or CVA tenderness Abdomen: soft, obese, nontender Extremities: trace pretibial edema.  Some hyperpigmentation of lower shins. Skin intact, no erythema.  ASSESSMENT/PLAN:  Mixed hyperlipidemia - TG improved, still above goal. Cont lowfat diet, Vascepa 4/d.  Zetia added to lipitor to achieve LDL goal per cardiologist  Elevated LFTs - improved on recheck. Encouraged to cont weight loss. Lipids improved  Paroxysmal atrial fibrillation (HCC) - sinus rhythm today, anticoagulated.  Prostate cancer ([place]) - sounds as though expectant management is the plan,  with f/u PSA. Will get last note with biopsy results from urologist  Chronic diastolic congestive heart failure ([place]) - improved. Cont lasix and current meds.  Echo planned for later this month  Aortic valve stenosis, etiology of cardiac valve disease unspecified - echo planned for later this month (doubt severe given no sx other than DOE which improved)  DOE (dyspnea on exertion) - improved with weight loss, improvement in volume status/CHF, and with use of albuterol prior to exercise. Echo pending.  CXR normal  SCC (squamous cell carcinoma), scalp/neck - to f/u with Dr. Denna Haggard for additional treatment.  I spent 45 minutes dedicated to the care of this patient (30-35 FTF), including pre-visit review of records, face to face time, and documentation.  F/u as scheduled in 09/2020

## 2020-07-19 ENCOUNTER — Other Ambulatory Visit: Payer: Self-pay

## 2020-07-19 ENCOUNTER — Encounter: Payer: Self-pay | Admitting: Family Medicine

## 2020-07-19 ENCOUNTER — Ambulatory Visit (INDEPENDENT_AMBULATORY_CARE_PROVIDER_SITE_OTHER): Payer: Medicare HMO | Admitting: Family Medicine

## 2020-07-19 VITALS — BP 122/72 | HR 60 | Ht 67.0 in | Wt 251.4 lb

## 2020-07-19 DIAGNOSIS — R0609 Other forms of dyspnea: Secondary | ICD-10-CM

## 2020-07-19 DIAGNOSIS — I350 Nonrheumatic aortic (valve) stenosis: Secondary | ICD-10-CM

## 2020-07-19 DIAGNOSIS — C61 Malignant neoplasm of prostate: Secondary | ICD-10-CM

## 2020-07-19 DIAGNOSIS — C4442 Squamous cell carcinoma of skin of scalp and neck: Secondary | ICD-10-CM | POA: Diagnosis not present

## 2020-07-19 DIAGNOSIS — I480 Paroxysmal atrial fibrillation: Secondary | ICD-10-CM

## 2020-07-19 DIAGNOSIS — R7989 Other specified abnormal findings of blood chemistry: Secondary | ICD-10-CM | POA: Diagnosis not present

## 2020-07-19 DIAGNOSIS — I5032 Chronic diastolic (congestive) heart failure: Secondary | ICD-10-CM | POA: Diagnosis not present

## 2020-07-19 DIAGNOSIS — R0600 Dyspnea, unspecified: Secondary | ICD-10-CM

## 2020-07-19 DIAGNOSIS — E782 Mixed hyperlipidemia: Secondary | ICD-10-CM

## 2020-07-19 NOTE — Patient Instructions (Signed)
Continue your current medications. Since the albuterol prior to exercise seems to help, continue this. Follow up as planned for echocardiogram. Continue weight loss--good job!  See you in January, sooner if there are any problems or concerns.

## 2020-07-27 ENCOUNTER — Telehealth: Payer: Self-pay | Admitting: Family Medicine

## 2020-07-27 NOTE — Telephone Encounter (Signed)
Received requested records from [facility].  

## 2020-07-28 DIAGNOSIS — H2513 Age-related nuclear cataract, bilateral: Secondary | ICD-10-CM | POA: Diagnosis not present

## 2020-07-28 DIAGNOSIS — H35363 Drusen (degenerative) of macula, bilateral: Secondary | ICD-10-CM | POA: Diagnosis not present

## 2020-07-28 DIAGNOSIS — H25013 Cortical age-related cataract, bilateral: Secondary | ICD-10-CM | POA: Diagnosis not present

## 2020-07-28 DIAGNOSIS — E119 Type 2 diabetes mellitus without complications: Secondary | ICD-10-CM | POA: Diagnosis not present

## 2020-07-28 LAB — HM DIABETES EYE EXAM

## 2020-07-31 ENCOUNTER — Encounter: Payer: Self-pay | Admitting: *Deleted

## 2020-08-01 ENCOUNTER — Encounter: Payer: Self-pay | Admitting: Family Medicine

## 2020-08-07 ENCOUNTER — Other Ambulatory Visit: Payer: Self-pay

## 2020-08-07 ENCOUNTER — Ambulatory Visit (HOSPITAL_COMMUNITY): Payer: Medicare HMO | Attending: Cardiology

## 2020-08-07 DIAGNOSIS — I350 Nonrheumatic aortic (valve) stenosis: Secondary | ICD-10-CM | POA: Diagnosis not present

## 2020-08-07 LAB — ECHOCARDIOGRAM COMPLETE
AR max vel: 1.95 cm2
AV Area VTI: 2.01 cm2
AV Area mean vel: 1.94 cm2
AV Mean grad: 16 mmHg
AV Peak grad: 30.4 mmHg
Ao pk vel: 2.76 m/s
Area-P 1/2: 2.14 cm2
S' Lateral: 3.3 cm

## 2020-08-10 DIAGNOSIS — G4733 Obstructive sleep apnea (adult) (pediatric): Secondary | ICD-10-CM | POA: Diagnosis not present

## 2020-08-15 DIAGNOSIS — I4891 Unspecified atrial fibrillation: Secondary | ICD-10-CM

## 2020-08-15 MED ORDER — APIXABAN 5 MG PO TABS: 5.0000 mg | ORAL_TABLET | Freq: Two times a day (BID) | ORAL | 2 refills | Status: AC

## 2020-08-16 ENCOUNTER — Encounter: Payer: Self-pay | Admitting: Family Medicine

## 2020-08-17 ENCOUNTER — Encounter: Payer: Self-pay | Admitting: Family Medicine

## 2020-08-18 ENCOUNTER — Other Ambulatory Visit: Payer: Self-pay | Admitting: Cardiology

## 2020-08-30 ENCOUNTER — Ambulatory Visit (INDEPENDENT_AMBULATORY_CARE_PROVIDER_SITE_OTHER): Payer: Medicare HMO | Admitting: Dermatology

## 2020-08-30 ENCOUNTER — Other Ambulatory Visit: Payer: Self-pay

## 2020-08-30 ENCOUNTER — Encounter: Payer: Self-pay | Admitting: Dermatology

## 2020-08-30 DIAGNOSIS — C4442 Squamous cell carcinoma of skin of scalp and neck: Secondary | ICD-10-CM

## 2020-08-30 DIAGNOSIS — D044 Carcinoma in situ of skin of scalp and neck: Secondary | ICD-10-CM | POA: Diagnosis not present

## 2020-08-30 DIAGNOSIS — C4492 Squamous cell carcinoma of skin, unspecified: Secondary | ICD-10-CM

## 2020-08-30 NOTE — Patient Instructions (Signed)

## 2020-08-31 DIAGNOSIS — I252 Old myocardial infarction: Secondary | ICD-10-CM | POA: Diagnosis not present

## 2020-08-31 DIAGNOSIS — I25119 Atherosclerotic heart disease of native coronary artery with unspecified angina pectoris: Secondary | ICD-10-CM | POA: Diagnosis not present

## 2020-08-31 DIAGNOSIS — J309 Allergic rhinitis, unspecified: Secondary | ICD-10-CM | POA: Diagnosis not present

## 2020-08-31 DIAGNOSIS — D6869 Other thrombophilia: Secondary | ICD-10-CM | POA: Diagnosis not present

## 2020-08-31 DIAGNOSIS — I10 Essential (primary) hypertension: Secondary | ICD-10-CM | POA: Diagnosis not present

## 2020-08-31 DIAGNOSIS — G4733 Obstructive sleep apnea (adult) (pediatric): Secondary | ICD-10-CM | POA: Diagnosis not present

## 2020-08-31 DIAGNOSIS — E785 Hyperlipidemia, unspecified: Secondary | ICD-10-CM | POA: Diagnosis not present

## 2020-08-31 DIAGNOSIS — E119 Type 2 diabetes mellitus without complications: Secondary | ICD-10-CM | POA: Diagnosis not present

## 2020-08-31 DIAGNOSIS — I4891 Unspecified atrial fibrillation: Secondary | ICD-10-CM | POA: Diagnosis not present

## 2020-09-02 NOTE — Progress Notes (Signed)
   Follow-Up Visit   Subjective  Jeffrey Wiggins is a 80 y.o. male who presents for the following: Procedure (Here for treatment- mid occipital scalp, right forehead & mid forehead- scc x 3).  Skin cancer Location: Scalp Duration:  Quality:  Associated Signs/Symptoms: Modifying Factors:  Severity:  Timing: Context: Second scalp spot has grown  Objective  Well appearing patient in no apparent distress; mood and affect are within normal limits. Objective  Mid Occipital Scalp: Here for treatment; biopsy identified by nurse and me.  Objective  Right Parietal Scalp: Thick 1 cm crust          A focused examination was performed including Head and neck.. Relevant physical exam findings are noted in the Assessment and Plan.   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  Destruction of lesion Complexity: simple   Destruction method: electrodesiccation and curettage   Informed consent: discussed and consent obtained   Timeout:  patient name, date of birth, surgical site, and procedure verified Anesthesia: the lesion was anesthetized in a standard fashion   Anesthetic:  1% lidocaine w/ epinephrine 1-100,000 local infiltration Curettage performed in three different directions: Yes   Electrodesiccation performed over the curetted area: Yes   Curettage cycles:  3 Lesion length (cm):  1.8 Lesion width (cm):  1.8 Margin per side (cm):  0 Final wound size (cm):  1.8 Hemostasis achieved with:  ferric subsulfate Outcome: patient tolerated procedure well with no complications   Additional details:  Wound innoculated with 5 fluorouracil solution.  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1 Hemostasis achieved with:  aluminum chloride and ferric subsulfate Outcome: patient tolerated procedure well with no complications   Post-procedure details: wound care instructions given   Additional details:  Wound innoculated with 5 fluorouracil solution.  Specimen 1 - Surgical pathology Differential Diagnosis: bcc vs scc  Check Margins: No      I, Lavonna Monarch, MD, have reviewed all documentation for this visit.  The documentation on 09/02/20 for the exam, diagnosis, procedures, and orders are all accurate and complete.

## 2020-09-11 ENCOUNTER — Ambulatory Visit: Payer: Medicare HMO | Admitting: Dermatology

## 2020-09-22 DIAGNOSIS — G4733 Obstructive sleep apnea (adult) (pediatric): Secondary | ICD-10-CM | POA: Diagnosis not present

## 2020-09-27 ENCOUNTER — Other Ambulatory Visit: Payer: Self-pay | Admitting: Family Medicine

## 2020-09-27 DIAGNOSIS — E1151 Type 2 diabetes mellitus with diabetic peripheral angiopathy without gangrene: Secondary | ICD-10-CM

## 2020-10-09 DIAGNOSIS — I480 Paroxysmal atrial fibrillation: Secondary | ICD-10-CM | POA: Diagnosis not present

## 2020-10-09 DIAGNOSIS — E785 Hyperlipidemia, unspecified: Secondary | ICD-10-CM | POA: Diagnosis not present

## 2020-10-09 LAB — LIPID PANEL
Chol/HDL Ratio: 2.9 ratio (ref 0.0–5.0)
Cholesterol, Total: 142 mg/dL (ref 100–199)
HDL: 49 mg/dL (ref >39)
LDL Chol Calc (NIH): 63 mg/dL (ref 0–99)
Triglycerides: 177 mg/dL — ABNORMAL HIGH (ref 0–149)
VLDL Cholesterol Cal: 30 mg/dL (ref 5–40)

## 2020-10-09 LAB — COMPREHENSIVE METABOLIC PANEL
ALT: 49 IU/L — ABNORMAL HIGH (ref 0–44)
AST: 31 IU/L (ref 0–40)
Albumin/Globulin Ratio: 1.8 (ref 1.2–2.2)
Albumin: 4.5 g/dL (ref 3.7–4.7)
Alkaline Phosphatase: 62 IU/L (ref 44–121)
BUN/Creatinine Ratio: 14 (ref 10–24)
BUN: 16 mg/dL (ref 8–27)
Bilirubin Total: 0.6 mg/dL (ref 0.0–1.2)
CO2: 27 mmol/L (ref 20–29)
Calcium: 9.7 mg/dL (ref 8.6–10.2)
Chloride: 99 mmol/L (ref 96–106)
Creatinine, Ser: 1.13 mg/dL (ref 0.76–1.27)
GFR calc Af Amer: 71 mL/min/{1.73_m2} (ref >59)
GFR calc non Af Amer: 61 mL/min/{1.73_m2} (ref >59)
Globulin, Total: 2.5 g/dL (ref 1.5–4.5)
Glucose: 132 mg/dL — ABNORMAL HIGH (ref 65–99)
Potassium: 4.3 mmol/L (ref 3.5–5.2)
Sodium: 143 mmol/L (ref 134–144)
Total Protein: 7 g/dL (ref 6.0–8.5)

## 2020-10-09 LAB — CBC
Hematocrit: 39.4 % (ref 37.5–51.0)
Hemoglobin: 13.4 g/dL (ref 13.0–17.7)
MCH: 31.5 pg (ref 26.6–33.0)
MCHC: 34 g/dL (ref 31.5–35.7)
MCV: 93 fL (ref 79–97)
Platelets: 241 10*3/uL (ref 150–450)
RBC: 4.25 x10E6/uL (ref 4.14–5.80)
RDW: 12.7 % (ref 11.6–15.4)
WBC: 7.5 10*3/uL (ref 3.4–10.8)

## 2020-10-09 LAB — MAGNESIUM: Magnesium: 1.9 mg/dL (ref 1.6–2.3)

## 2020-10-15 NOTE — Progress Notes (Signed)
Chief Complaint  Patient presents with  . med check    Med check no other issues, 2 biopsy in the past two years. Scheduled for a visit for a f/u on the prostate cancer in March. Ankles swell a little bit from time to time.     Diabetes:Diabetes was diagnosed 05/2013 based on A1c of 6.7.He hadbeen controlled just with diet (only took metformin for a short time initially), until 06/2018, at which time his A1c was back up to 6.5, and Metformin was started. He is tolerating Metformin with side effects. His wife occasionally checks his sugars, and in the mornings it runs 100-120, fasting. Fasting glu was 132 on labs last week.  He drinks 1 glass of wine3-4/week. Unsweet tea when he goes out to eat. Mostly drinks water. Normal meals are low in carbs.He reports limiting his sweets, that his wife controls his foods (small portions, not giving him a lot of baked goods, she isn't baking as much).  No polydipsia, polyuria (except related to his lasix use in the morning), hypoglycemia, numbness, tingling, vision or feet concerns.  Last eye exam was11/2021. Lab Results  Component Value Date   HGBA1C 6.3 (A) 04/10/2020   He has elevated uric acid levels, and previously reported having R great toe pain when he ate shrimp.  He denies any joint pain/toe pain. He was given information on low purine diet at his last visit (09/2018). Uric acid remained elevated on last check.  Treatment had been declined by pt (without symptoms), so hadn't been repeated. He continues to do well, without having any toe or other joint swelling/pain. Lab Results  Component Value Date   LABURIC 8.0 03/18/2019   GERD--controlled with meds.Compliant with taking prilosec daily, and denies heartburn.Denies dysphagia.  Some chronic cough, gets up clear-whitemucus a few times/day--chronic and unchanged. He has some chronic DOE (stairs down to his basement, or 15 minutes on the exercise bike). He is using albuterol prior to  exercise (he thinks this has helped some).  OSA-- compliant with use of CPAP. Reports feeling refreshed in the mornings, denies daytime somnolence.   Hyperlipidemia: Vascepa was added to his Lipitor in 03/2020, and Dr. Stanford Breed added Chauncey Cruel in November.   He had repeat lipids last week for Dr. Kavin Leech much improved, TG still up at 177. Lab Results  Component Value Date   CHOL 142 10/09/2020   HDL 49 10/09/2020   LDLCALC 63 10/09/2020   TRIG 177 (H) 10/09/2020   CHOLHDL 2.9 10/09/2020   Elevated LFT's--AST and ALT were up to 72 and 106 in 03/2020.  They improved on recheck. They were repeated again last week, and was similar. He denies any abdominal/RUQ pain.  Lab Results  Component Value Date   ALT 49 (H) 10/09/2020   AST 31 10/09/2020   ALKPHOS 62 10/09/2020   BILITOT 0.6 10/09/2020   CT in 04/2020 showed: IMPRESSION: 1.  No acute findings in the abdomen/pelvis. 2. 2.5 cm hypodensity over the right lobe of the liver likely a hemangioma. Consider ultrasound for further evaluation. 3.  Cholelithiasis as evidence by single 2 cm gallstone. 4. Aortic Atherosclerosis (ICD10-I70.0). Atherosclerotic coronary artery disease. 5.  Moderate colonic diverticulosis without active inflammation. 6. 2.7 cm right renal cyst. Subcentimeter left renal cortical hypodensity likely a cyst, but too small to characterize.  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 He no longersees Dr. Leonie Man. Goalsrecommended by neuro:BP <130/90, hemoglobin A1c goal below 6.5% and  lipids with LDL cholesterol goal below 70 mg/dL.He slipped on the ice a week ago, and had a small area that bled.  Otherwise denies any bleeding/bruising.  He remains under the care of Dr. Stanford Breed for atrial fibrillation, HTN, CAD, diastolic heart failure. He had labs done for Dr. Stanford Breed last week.  He doesn't have f/u scheduled. Last echo was 07/2020,  normal LV function, mild AS.  HTN: He is compliant with Losartanand Lopressor.  BP's at home are running120's/70's, up to 140 occasionally. He denies headaches, dizziness, chest pain, palpitations, orthopnea.  Afib--he remains on Eliquis without any bleeding or complications. Doing well on Tikosyn, lopressor, denies palpitations. Rate is controlled on his current regimen.  CAD: S/p CABG; stable. Not on aspirin due to being on eliquis.He has had some DOE, but this improved some with weight loss and using albuterol prior to exercise has helped. CXR was normal  Diastolic heart failure--on 80mg  daily of lasix per cardiologist. Compression stockings are also helpful for his edema (but requires his wife's assistance to get on/off).  Carotid artery stenosis--last [place] was 09/2016, and showed 1-39% blockage, no f/u needed per Dr. Stanford Breed  Prostate cancer:  This was diagnosed in 10/2019. Testosterone injections were stopped at that time, and PSA improved. Last PSA 1.85 in 04/2020. Repeat biopsy was recommended prior to continuing with active surveillance. He reports he had the second biopsy, and was told that it showed less (not more) cancer. Records not received from this biopsy. He has f/u in March.   PMH, PSH, SH reviewed  Outpatient Encounter Medications as of 10/16/2020  Medication Sig  . acetaminophen (TYLENOL) 500 MG tablet Take 1,000 mg by mouth at bedtime as needed for mild pain.  Marland Kitchen albuterol (VENTOLIN HFA) 108 (90 Base) MCG/ACT inhaler Inhale 2 puffs into the lungs every 6 (six) hours as needed for wheezing or shortness of breath.  Marland Kitchen amoxicillin (AMOXIL) 500 MG capsule TAKE 4 CAPSULES BY MOUTH 1 HOUR PRE DENTAL APPOINTMENT  . apixaban (ELIQUIS) 5 MG TABS tablet Take 1 tablet (5 mg total) by mouth 2 (two) times daily.  Marland Kitchen atorvastatin (LIPITOR) 80 MG tablet TAKE 1 TABLET BY MOUTH EVERY EVENING  . CALCIUM-MAGNESIUM-ZINC PO Take 1 tablet by mouth at bedtime.   . cetirizine (ZYRTEC) 10  MG tablet Take 10 mg by mouth daily.  . Cholecalciferol (VITAMIN D) 2000 UNITS tablet Take 4,000 Units by mouth at bedtime.   . Coenzyme Q10 (CO Q-10) 100 MG CAPS Take 100 mg by mouth at bedtime.   . DENTA 5000 PLUS 1.1 % CREA dental cream Take by mouth as directed.  . dofetilide (TIKOSYN) 500 MCG capsule TAKE 1 CAPSULE BY MOUTH TWICE A DAY  . fluticasone (FLONASE) 50 MCG/ACT nasal spray USE 2 SPRAYS NASALLY DAILY  . furosemide (LASIX) 40 MG tablet Take 2 tablets (80 mg total) by mouth daily.  Marland Kitchen icosapent Ethyl (VASCEPA) 1 g capsule Take 2 capsules (2 g total) by mouth 2 (two) times daily.  Marland Kitchen losartan (COZAAR) 100 MG tablet TAKE 1 TABLET BY MOUTH EVERY DAY  . metFORMIN (GLUCOPHAGE-XR) 500 MG 24 hr tablet TAKE 2 TABLETS BY MOUTH EVERY DAY WITH BREAKFAST  . metoprolol tartrate (LOPRESSOR) 25 MG tablet Take 1 tablet (25 mg total) by mouth 2 (two) times daily.  . Multiple Vitamin (MULTIVITAMIN WITH MINERALS) TABS tablet Take 1 tablet by mouth daily. Centrum silver  . nitroGLYCERIN (NITROSTAT) 0.4 MG SL tablet Place 1 tablet (0.4 mg total) under the tongue every 5 (five)  minutes as needed for chest pain.  Marland Kitchen omeprazole (PRILOSEC) 20 MG capsule Take 20 mg by mouth every other day.   . ezetimibe (ZETIA) 10 MG tablet Take 1 tablet (10 mg total) by mouth daily.   No facility-administered encounter medications on file as of 10/16/2020.   No Known Allergies   ROS: no fever, chills, URI symptoms, nausea, vomiting, heartburn, abdominal pain or bowel changes. No headaches, dizziness, chest pain, palpitations. DOE improved/stable. Edema at ankles when not wearing compression socks (needs wife to help him get them on/off). No bleeding, bruising. Moods are good.   PHYSICAL EXAM:  BP 120/72   Pulse 60   Temp 98.2 F (36.8 C)   Wt 255 lb 3.2 oz (115.8 kg)   BMI 39.97 kg/m   Wt Readings from Last 3 Encounters:  10/16/20 255 lb 3.2 oz (115.8 kg)  07/19/20 251 lb 6.4 oz (114 kg)  07/17/20 256 lb 3.2  oz (116.2 kg)   Pleasant male, in no distress HEENT: conjunctiva and sclera are clear, EOMI, wearing mask Neck: no lymphadenopathy or mass Heart: regular rate and rhythm. 3/6 SEM heard throughout Lungs: clear bilaterally Back: no spinal or CVA tenderness Abdomen: soft, obese, nontender Extremities: trace pretibial edema.  Some hyperpigmentation of lower shins. Skin intact, no erythema. Psych: normal mood, affect, hygiene and grooming Neuro: alert and oriented. Normal gait.  A1c 6.2% today    Chemistry      Component Value Date/Time   NA 143 10/09/2020 0939   K 4.3 10/09/2020 0939   CL 99 10/09/2020 0939   CO2 27 10/09/2020 0939   BUN 16 10/09/2020 0939   CREATININE 1.13 10/09/2020 0939   CREATININE 1.00 09/19/2016 1117      Component Value Date/Time   CALCIUM 9.7 10/09/2020 0939   ALKPHOS 62 10/09/2020 0939   AST 31 10/09/2020 0939   ALT 49 (H) 10/09/2020 0939   BILITOT 0.6 10/09/2020 0939     Fasting glucose 132  Lab Results  Component Value Date   CHOL 142 10/09/2020   HDL 49 10/09/2020   LDLCALC 63 10/09/2020   TRIG 177 (H) 10/09/2020   CHOLHDL 2.9 10/09/2020   Lab Results  Component Value Date   WBC 7.5 10/09/2020   HGB 13.4 10/09/2020   HCT 39.4 10/09/2020   MCV 93 10/09/2020   PLT 241 10/09/2020   (Hg has declined from 16-17 range since stopping testosterone)   ASSESSMENT/PLAN:   Type 2 diabetes mellitus with retinopathy, without long-term current use of insulin, macular edema presence unspecified, unspecified laterality, unspecified retinopathy severity ([place]) - DM controlled with metformin. Reviewed proper diet, exercise, and encouraged weight loss - Plan: HgB A1c  Hyperlipidemia associated with type 2 diabetes mellitus (HCC) - LDL improved since adding zetia, TG remains above goal. Counseled re: diet (cut back on alcohol). Cont current meds  Hypertension associated with diabetes ([place]) - controlled  Chronic diastolic congestive heart failure  ([place]) - stable/controlled on current regimen  Prostate cancer ([place]) - active surveillance per urologist  Paroxysmal atrial fibrillation ([place]) - on Tikosyn, rate controlled.  Per cardiologist  Elevated LFTs - Encouraged him to limit alcohol, lose weight.  Only mildly elevated (improved)  Essential hypertension - controlled on current regimen. Daily exercise and wt loss recommended, along with low Na diet  Obstructive sleep apnea - Compliant with CPAP, continue  Coronary artery disease involving native coronary artery of native heart without angina pectoris - stable, asymptomatic.  Some DOE (multifactorial), unchanged/improved  Anticoagulant  long-term use - no complications.  Hg decreased some, but also since testosterone stopped. Cont to monitor  Elevated uric acid in blood - denies any flares of gout/joint pains. Declines medications to lower value, risks reviewed  Need for COVID-19 vaccine - Plan: Moderna Covid-19 Booster  Mixed hyperlipidemia - counseled re: lowfat diet, encouraged cutting back on alcohol. Cont vascepa, crestor, zetia - Plan: icosapent Ethyl (VASCEPA) 1 g capsule  Aortic atherosclerosis ([place]) - cont lipid-lowering medications per cardiologist  Class 2 severe obesity due to excess calories with serious comorbidity and body mass index (BMI) of 39.0 to 39.9 in adult [facility]) - comorbidities include DM, HTN, OSA. Counseled re: diet/exercise, wt loss. Declined MWM or nutritionist referral  F/u 6 mos CPE/AWV  I spent 50 minutes dedicated to the care of this patient, including pre-visit review of records, face to face time, post-visit ordering of testing and documentation.

## 2020-10-16 ENCOUNTER — Encounter: Payer: Self-pay | Admitting: Family Medicine

## 2020-10-16 ENCOUNTER — Ambulatory Visit (INDEPENDENT_AMBULATORY_CARE_PROVIDER_SITE_OTHER): Payer: Medicare HMO | Admitting: Family Medicine

## 2020-10-16 ENCOUNTER — Other Ambulatory Visit: Payer: Self-pay

## 2020-10-16 VITALS — BP 120/72 | HR 60 | Temp 98.2°F | Wt 255.2 lb

## 2020-10-16 DIAGNOSIS — I5032 Chronic diastolic (congestive) heart failure: Secondary | ICD-10-CM | POA: Diagnosis not present

## 2020-10-16 DIAGNOSIS — I10 Essential (primary) hypertension: Secondary | ICD-10-CM | POA: Diagnosis not present

## 2020-10-16 DIAGNOSIS — R7989 Other specified abnormal findings of blood chemistry: Secondary | ICD-10-CM | POA: Diagnosis not present

## 2020-10-16 DIAGNOSIS — E11319 Type 2 diabetes mellitus with unspecified diabetic retinopathy without macular edema: Secondary | ICD-10-CM | POA: Diagnosis not present

## 2020-10-16 DIAGNOSIS — Z7901 Long term (current) use of anticoagulants: Secondary | ICD-10-CM

## 2020-10-16 DIAGNOSIS — E1169 Type 2 diabetes mellitus with other specified complication: Secondary | ICD-10-CM

## 2020-10-16 DIAGNOSIS — E1159 Type 2 diabetes mellitus with other circulatory complications: Secondary | ICD-10-CM | POA: Diagnosis not present

## 2020-10-16 DIAGNOSIS — I152 Hypertension secondary to endocrine disorders: Secondary | ICD-10-CM

## 2020-10-16 DIAGNOSIS — I2510 Atherosclerotic heart disease of native coronary artery without angina pectoris: Secondary | ICD-10-CM | POA: Diagnosis not present

## 2020-10-16 DIAGNOSIS — I480 Paroxysmal atrial fibrillation: Secondary | ICD-10-CM

## 2020-10-16 DIAGNOSIS — I700 Atherosclerosis of aorta: Secondary | ICD-10-CM

## 2020-10-16 DIAGNOSIS — C61 Malignant neoplasm of prostate: Secondary | ICD-10-CM | POA: Diagnosis not present

## 2020-10-16 DIAGNOSIS — G4733 Obstructive sleep apnea (adult) (pediatric): Secondary | ICD-10-CM | POA: Diagnosis not present

## 2020-10-16 DIAGNOSIS — E785 Hyperlipidemia, unspecified: Secondary | ICD-10-CM

## 2020-10-16 DIAGNOSIS — Z23 Encounter for immunization: Secondary | ICD-10-CM | POA: Diagnosis not present

## 2020-10-16 DIAGNOSIS — Z6839 Body mass index (BMI) 39.0-39.9, adult: Secondary | ICD-10-CM

## 2020-10-16 DIAGNOSIS — E790 Hyperuricemia without signs of inflammatory arthritis and tophaceous disease: Secondary | ICD-10-CM

## 2020-10-16 DIAGNOSIS — E782 Mixed hyperlipidemia: Secondary | ICD-10-CM

## 2020-10-16 LAB — POCT GLYCOSYLATED HEMOGLOBIN (HGB A1C)
HbA1c, POC (controlled diabetic range): 6.2 % (ref 0.0–7.0)
Hemoglobin A1C: 6.2 % — AB (ref 4.0–5.6)

## 2020-10-16 MED ORDER — ICOSAPENT ETHYL 1 G PO CAPS: 2.0000 g | ORAL_CAPSULE | Freq: Two times a day (BID) | ORAL | 6 refills | Status: AC

## 2020-10-23 DIAGNOSIS — G4733 Obstructive sleep apnea (adult) (pediatric): Secondary | ICD-10-CM | POA: Diagnosis not present

## 2020-10-26 ENCOUNTER — Ambulatory Visit (INDEPENDENT_AMBULATORY_CARE_PROVIDER_SITE_OTHER): Payer: Medicare HMO | Admitting: Dermatology

## 2020-10-26 ENCOUNTER — Other Ambulatory Visit: Payer: Self-pay | Admitting: Dermatology

## 2020-10-26 ENCOUNTER — Other Ambulatory Visit: Payer: Self-pay

## 2020-10-26 ENCOUNTER — Ambulatory Visit (INDEPENDENT_AMBULATORY_CARE_PROVIDER_SITE_OTHER): Payer: Medicare HMO | Admitting: Family Medicine

## 2020-10-26 ENCOUNTER — Encounter: Payer: Self-pay | Admitting: Dermatology

## 2020-10-26 ENCOUNTER — Encounter: Payer: Self-pay | Admitting: Family Medicine

## 2020-10-26 VITALS — BP 130/80 | HR 81 | Temp 99.1°F | Wt 258.8 lb

## 2020-10-26 DIAGNOSIS — M109 Gout, unspecified: Secondary | ICD-10-CM

## 2020-10-26 DIAGNOSIS — M25541 Pain in joints of right hand: Secondary | ICD-10-CM | POA: Diagnosis not present

## 2020-10-26 DIAGNOSIS — D485 Neoplasm of uncertain behavior of skin: Secondary | ICD-10-CM | POA: Diagnosis not present

## 2020-10-26 DIAGNOSIS — M7989 Other specified soft tissue disorders: Secondary | ICD-10-CM | POA: Diagnosis not present

## 2020-10-26 DIAGNOSIS — R238 Other skin changes: Secondary | ICD-10-CM

## 2020-10-26 DIAGNOSIS — L570 Actinic keratosis: Secondary | ICD-10-CM

## 2020-10-26 DIAGNOSIS — D044 Carcinoma in situ of skin of scalp and neck: Secondary | ICD-10-CM

## 2020-10-26 MED ORDER — TOLAK 4 % EX CREA: 1.0000 "application " | TOPICAL_CREAM | Freq: Every evening | CUTANEOUS | 1 refills | Status: AC

## 2020-10-26 MED ORDER — COLCHICINE 0.6 MG PO TABS: ORAL_TABLET | ORAL | 0 refills | Status: AC

## 2020-10-26 NOTE — Patient Instructions (Signed)
Take the colchicine as prescribed.   Stop your cholesterol mediation, atorvastatin, while you are taking this medication.   If you are getting worse, let us know.   You should follow up with Dr. Tomi Bamberger if you have another flare up. You may need to be on a daily preventive if you start having flare ups more often.     Low-Purine Eating Plan A low-purine eating plan involves making food choices to limit your intake of purine. Purine is a kind of uric acid. Too much uric acid in your blood can cause certain conditions, such as gout and kidney stones. Eating a low-purine diet can help control these conditions. What are tips for following this plan? Reading food labels  Avoid foods with saturated or Trans fat.  Check the ingredient list of grains-based foods, such as bread and cereal, to make sure that they contain whole grains.  Check the ingredient list of sauces or soups to make sure they do not contain meat or fish.  When choosing soft drinks, check the ingredient list to make sure they do not contain high-fructose corn syrup. Shopping  Buy plenty of fresh fruits and vegetables.  Avoid buying canned or fresh fish.  Buy dairy products labeled as low-fat or nonfat.  Avoid buying premade or processed foods. These foods are often high in fat, salt (sodium), and added sugar.   Cooking  Use olive oil instead of butter when cooking. Oils like olive oil, canola oil, and sunflower oil contain healthy fats. Meal planning  Learn which foods do or do not affect you. If you find out that a food tends to cause your gout symptoms to flare up, avoid eating that food. You can enjoy foods that do not cause problems. If you have any questions about a food item, talk with your dietitian or health care provider.  Limit foods high in fat, especially saturated fat. Fat makes it harder for your body to get rid of uric acid.  Choose foods that are lower in fat and are lean sources of protein. General  guidelines  Limit alcohol intake to no more than 1 drink a day for nonpregnant women and 2 drinks a day for men. One drink equals 12 oz of beer, 5 oz of wine, or 1 oz of hard liquor. Alcohol can affect the way your body gets rid of uric acid.  Drink plenty of water to keep your urine clear or pale yellow. Fluids can help remove uric acid from your body.  If directed by your health care provider, take a vitamin C supplement.  Work with your health care provider and dietitian to develop a plan to achieve or maintain a healthy weight. Losing weight can help reduce uric acid in your blood. What foods are recommended? The items listed may not be a complete list. Talk with your dietitian about what dietary choices are best for you. Foods low in purines Foods low in purines do not need to be limited. These include:  All fruits.  All low-purine vegetables, pickles, and olives.  Breads, pasta, rice, cornbread, and popcorn. Cake and other baked goods.  All dairy foods.  Eggs, nuts, and nut butters.  Spices and condiments, such as salt, herbs, and vinegar.  Plant oils, butter, and margarine.  Water, sugar-free soft drinks, tea, coffee, and cocoa.  Vegetable-based soups, broths, sauces, and gravies. Foods moderate in purines Foods moderate in purines should be limited to the amounts listed.   cup of asparagus, cauliflower, spinach, mushrooms,  or green peas, each day.  2/3 cup uncooked oatmeal, each day.   cup dry wheat bran or wheat germ, each day.  2-3 ounces of meat or poultry, each day.  4-6 ounces of shellfish, such as crab, lobster, oysters, or shrimp, each day.  1 cup cooked beans, peas, or lentils, each day.  Soup, broths, or bouillon made from meat or fish. Limit these foods as much as possible. What foods are not recommended? The items listed may not be a complete list. Talk with your dietitian about what dietary choices are best for you. Limit your intake of foods  high in purines, including:  Beer and other alcohol.  Meat-based gravy or sauce.  Canned or fresh fish, such as: ? Anchovies, sardines, herring, and tuna. ? Mussels and scallops. ? Codfish, trout, and haddock.  Berniece Salines.  Organ meats, such as: ? Liver or kidney. ? Tripe. ? Sweetbreads (thymus gland or pancreas).  Wild Clinical biochemist.  Yeast or yeast extract supplements.  Drinks sweetened with high-fructose corn syrup. Summary  Eating a low-purine diet can help control conditions caused by too much uric acid in the body, such as gout or kidney stones.  Choose low-purine foods, limit alcohol, and limit foods high in fat.  You will learn over time which foods do or do not affect you. If you find out that a food tends to cause your gout symptoms to flare up, avoid eating that food. This information is not intended to replace advice given to you by your health care provider. Make sure you discuss any questions you have with your health care provider. Document Revised: 12/16/2019 Document Reviewed: 12/16/2019 Elsevier Patient Education  2021 [employer].

## 2020-10-26 NOTE — Patient Instructions (Signed)

## 2020-10-26 NOTE — Progress Notes (Signed)
   Subjective:    Patient ID: Jeffrey Wiggins, male    DOB: 1941/05/26, 80 y.o.   MRN: [id number]  HPI Chief Complaint  Patient presents with  . possible covid reaction    Had moderna shot on Wednesday 2 weeks ago.  This past Saturday started having pain in right index finger and thumb, rednesss and swollen. Elevating, CBD oil,    He is a 80 year old with a history of DM, HTN, OSA, prostate cancer, CVA, and gout. He normally sees Dr. Tomi Bamberger. He is seeing me today due to complaints of a 7-8 day history of redness, swelling and pain of the 2nd MCP joint of his right hand.  States he had similar symptoms in his right great toe a few days prior but those symptoms resolved.   States he has been using ice and CBD oil but it is not improving.  Last uric acid 8.0 in 2020. States he only has 1-2 gout attacks per year.   He has been eating shrimp lately. He drinks wine.   States he feels well in general.   Denies fever, chills, dizziness, chest pain, palpitations, shortness of breath, abdominal pain, N/V/D, urinary symptoms, LE edema.   States he received his Moderna booster shot in his left upper arm a couple of weeks ago. Questions whether this may be related.   Reviewed allergies, medications, past medical, surgical, family, and social history.    Review of Systems Pertinent positives and negatives in the history of present illness.     Objective:   Physical Exam BP 130/80   Pulse 81   Temp 99.1 F (37.3 C)   Wt 258 lb 12.8 oz (117.4 kg)   BMI 40.53 kg/m   Right hand with erythema, increased warmth, edema and TTP of his 2nd MCP joint. No palmar erythema or TTP.  He has decreased ROM of his right hand and decreased grip compared to left due to pain. Hand exam is normal otherwise. Normal right wrist and forearm. Good sensation, capillary refill and normal pulses.      Assessment & Plan:  Acute gout of right hand, unspecified cause - Plan: colchicine 0.6 MG tablet  Pain in joint of  right hand  Redness and swelling of hand  Most likely has a gout attack and we will treat him with colchicine. He will hold his statin while on the colchicine. Recommend low purine diet and handout provided.  Reports only 1-2 gout attacks per year. Advised he does not need a preventive medication for now.  Discussed letting me know if he is worsening or has any other questions. Follow up with Dr. Tomi Bamberger, his PCP, in future as scheduled.  Dr. Redmond School also examined patient.

## 2020-11-02 ENCOUNTER — Other Ambulatory Visit: Payer: Self-pay | Admitting: Family Medicine

## 2020-11-02 DIAGNOSIS — M109 Gout, unspecified: Secondary | ICD-10-CM

## 2020-11-02 NOTE — Telephone Encounter (Signed)
Is this okay to refill? 

## 2020-11-02 NOTE — Telephone Encounter (Signed)
Left message for pt to call me back 

## 2020-11-02 NOTE — Telephone Encounter (Signed)
Pt does not need this refilled.

## 2020-11-02 NOTE — Telephone Encounter (Signed)
If he is needing a refill already, I would like for him to follow up with Dr. Tomi Bamberger.

## 2020-11-20 DIAGNOSIS — G4733 Obstructive sleep apnea (adult) (pediatric): Secondary | ICD-10-CM | POA: Diagnosis not present

## 2020-11-27 DIAGNOSIS — C61 Malignant neoplasm of prostate: Secondary | ICD-10-CM | POA: Diagnosis not present

## 2020-12-04 DIAGNOSIS — C61 Malignant neoplasm of prostate: Secondary | ICD-10-CM | POA: Diagnosis not present

## 2020-12-04 DIAGNOSIS — N5201 Erectile dysfunction due to arterial insufficiency: Secondary | ICD-10-CM | POA: Diagnosis not present

## 2020-12-04 DIAGNOSIS — E291 Testicular hypofunction: Secondary | ICD-10-CM | POA: Diagnosis not present

## 2020-12-04 LAB — PSA: PSA: 1.26

## 2020-12-06 ENCOUNTER — Encounter: Payer: Self-pay | Admitting: *Deleted

## 2020-12-07 ENCOUNTER — Other Ambulatory Visit: Payer: Self-pay | Admitting: Family Medicine

## 2020-12-07 DIAGNOSIS — E782 Mixed hyperlipidemia: Secondary | ICD-10-CM

## 2020-12-11 ENCOUNTER — Telehealth: Payer: Self-pay | Admitting: Cardiology

## 2020-12-11 NOTE — Telephone Encounter (Signed)
   [facility] Pre-operative Risk Assessment    HEARTCARE STAFF: - Please ensure there is not already an duplicate clearance open for this procedure. - Under Visit Info/Reason for Call, type in Other and utilize the format Clearance MM/DD/YY or Clearance TBD. Do not use dashes or single digits. - If request is for dental extraction, please clarify the # of teeth to be extracted.  Request for surgical clearance:  1. What type of surgery is being performed? 1 Tooth Extraction  2. When is this surgery scheduled? Next week  3. What type of clearance is required (medical clearance vs. Pharmacy clearance to hold med vs. Both)? Pharmacy   4. Are there any medications that need to be held prior to surgery and how long? Eliquis; wants to know if it needs to be withheld  5. Practice name and name of physician performing surgery? [facility]; Dr. Cephus Slater  6. What is the office phone number? [phone]   7.   What is the office fax number? [phone]  8.   Anesthesia type (None, local, MAC, general) ? General   Phillip Pough 12/11/2020, 10:49 AM  _________________________________________________________________   (provider comments below)

## 2020-12-11 NOTE — Telephone Encounter (Signed)
   Primary Cardiologist: Kirk Ruths, MD  Chart reviewed as part of pre-operative protocol coverage.   Simple dental extractions (1-2 teeth) are considered low risk procedures per guidelines and generally do not require any specific cardiac clearance. It is also generally accepted that for simple extractions and dental cleanings, there is no need to interrupt blood thinner therapy.  SBE prophylaxis is not required for the patient from a cardiac standpoint.  I will route this recommendation to the requesting party via Epic fax function and remove from pre-op pool.  Please call with questions.  Kathyrn Drown, NP 12/11/2020, 11:14 AM

## 2020-12-12 ENCOUNTER — Encounter: Payer: Self-pay | Admitting: Family Medicine

## 2020-12-14 NOTE — Telephone Encounter (Signed)
Jeffrey Wiggins from [facility] is calling back for clearance due to not receiving it yet. She is requesting the clearance be faxed to her office [phone]

## 2020-12-17 ENCOUNTER — Other Ambulatory Visit: Payer: Self-pay | Admitting: Cardiology

## 2020-12-21 DIAGNOSIS — G4733 Obstructive sleep apnea (adult) (pediatric): Secondary | ICD-10-CM | POA: Diagnosis not present

## 2020-12-26 ENCOUNTER — Other Ambulatory Visit: Payer: Self-pay | Admitting: Family Medicine

## 2020-12-26 DIAGNOSIS — E1151 Type 2 diabetes mellitus with diabetic peripheral angiopathy without gangrene: Secondary | ICD-10-CM

## 2020-12-29 ENCOUNTER — Other Ambulatory Visit: Payer: Self-pay | Admitting: Cardiology

## 2021-01-20 ENCOUNTER — Other Ambulatory Visit: Payer: Self-pay | Admitting: Cardiology

## 2021-01-20 DIAGNOSIS — G4733 Obstructive sleep apnea (adult) (pediatric): Secondary | ICD-10-CM | POA: Diagnosis not present

## 2021-01-23 NOTE — Progress Notes (Deleted)
HPI: FU coronary artery disease, PAF,hypertension, and hyperlipidemia. Cath 7/15 with severe CAD and EF 50. He underwent bypass grafting x2 with LIMA to the LAD and vein graft to the first obtuse marginal branch of the left circumflex. Abd ultrasound 9/15 showed no aneurysm. Also with h/o aflutter, PAF and CVA. Nuclear study 1/18 showed EF 38, inferior MI and mild peri-infarct ischemia. Carotid dopplers 1/18 showed 1-39 bilateral stenosis. Echocardiogram November 2021 showed ejection fraction 60 to 24%, grade 1 diastolic dysfunction, mild aortic stenosis with mean gradient 16 mmHg and mildly dilated ascending aorta at 41 mm.  Since last seen,  Current Outpatient Medications  Medication Sig Dispense Refill  . acetaminophen (TYLENOL) 500 MG tablet Take 1,000 mg by mouth at bedtime as needed for mild pain.    Marland Kitchen albuterol (VENTOLIN HFA) 108 (90 Base) MCG/ACT inhaler Inhale 2 puffs into the lungs every 6 (six) hours as needed for wheezing or shortness of breath. 18 g 0  . apixaban (ELIQUIS) 5 MG TABS tablet Take 1 tablet (5 mg total) by mouth 2 (two) times daily. 180 tablet 2  . atorvastatin (LIPITOR) 80 MG tablet TAKE 1 TABLET BY MOUTH EVERY EVENING 90 tablet 1  . CALCIUM-MAGNESIUM-ZINC PO Take 1 tablet by mouth at bedtime.     . cetirizine (ZYRTEC) 10 MG tablet Take 10 mg by mouth daily.    . Cholecalciferol (VITAMIN D) 2000 UNITS tablet Take 4,000 Units by mouth at bedtime.     . Coenzyme Q10 (CO Q-10) 100 MG CAPS Take 100 mg by mouth at bedtime.     . colchicine 0.6 MG tablet Take 0.6 mg PO bid x 1 week. 30 tablet 0  . DENTA 5000 PLUS 1.1 % CREA dental cream Take by mouth as directed.    . dofetilide (TIKOSYN) 500 MCG capsule TAKE 1 CAPSULE BY MOUTH TWICE A DAY 180 capsule 3  . ezetimibe (ZETIA) 10 MG tablet Take 1 tablet (10 mg total) by mouth daily. 90 tablet 3  . Fluorouracil (TOLAK) 4 % CREA Apply 1 application topically at bedtime. 28 total applications 40 g 1  . fluticasone  (FLONASE) 50 MCG/ACT nasal spray USE 2 SPRAYS NASALLY DAILY 48 g 1  . furosemide (LASIX) 40 MG tablet TAKE 2 TABLETS BY MOUTH EVERY DAY 180 tablet 3  . icosapent Ethyl (VASCEPA) 1 g capsule Take 2 capsules (2 g total) by mouth 2 (two) times daily. 120 capsule 6  . losartan (COZAAR) 100 MG tablet TAKE 1 TABLET BY MOUTH EVERY DAY 90 tablet 2  . metFORMIN (GLUCOPHAGE-XR) 500 MG 24 hr tablet TAKE 2 TABLETS BY MOUTH EVERY DAY WITH BREAKFAST 180 tablet 0  . metoprolol tartrate (LOPRESSOR) 25 MG tablet Take 1 tablet (25 mg total) by mouth 2 (two) times daily. 180 tablet 2  . Multiple Vitamin (MULTIVITAMIN WITH MINERALS) TABS tablet Take 1 tablet by mouth daily. Centrum silver    . nitroGLYCERIN (NITROSTAT) 0.4 MG SL tablet Place 1 tablet (0.4 mg total) under the tongue every 5 (five) minutes as needed for chest pain. 25 tablet 12  . omeprazole (PRILOSEC) 20 MG capsule Take 20 mg by mouth daily.     No current facility-administered medications for this visit.     Past Medical History:  Diagnosis Date  . Adenomatous colon polyp 01/2015   Dr. Penelope Coop  . Allergy    RHINITIS  . Arthritis   . Atrial fibrillation ([place])   . Basal cell carcinoma 08/14/2016  mid forhead   . Benign prostatic hypertrophy    followed by Dr. Gaynelle Arabian; regrowth R lat lob (11/2015)  . CAD (coronary artery disease)   . Cataracts, bilateral   . Cerebrovascular disease   . Diverticular disease   . Diverticulosis    seen on colonoscopy (9/05, 06/2009)  . Erectile dysfunction   . Family history of adverse reaction to anesthesia    mother had memeory problems after anesthesia with hip replacement, lasted until she died 2 to 3 years later  . Gallstones   . GERD (gastroesophageal reflux disease)   . 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 Hepatitis age 55   "called yellow jaundice"  . HTN (hypertension)   . Hyperlipidemia   . Hyperplastic colon polyp 05/2004   tubular adenoma 01/2015  . Hypogonadism male     treated by Dr. Gaynelle Arabian  . Myocardial infarction [facility]) '98, '09   (Dr. Stanford Breed); s/p stents  . Nocturia   . Obesity   . Peripheral vascular disease ([place])    02/28/12 MOST RECENT CAROTID DUPLEX--"STABLE MILD CALCIFIED PLAQUE, BILATERALY, STABLE, OVER SERIAL EXAMS"  . Pneumonia age 41   hx of  . Prostatitis   . 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 DIFF SCC MOHS  . 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 SCC IN SITU  . Sepsis due to Escherichia coli [facility]) ? 2011   DUE TO URINARY OUTLET OBSTRUCTION /INFECTION  . Shortness of breath dyspnea    with exertion only  . Sleep apnea    mild, CPAP wasn't indicated (per pt)--2014 mod-severe, on CPAP since 07/2013  . Squamous cell carcinoma of skin 06/12/2020   well diff- mid occipital scalp (CX35FU)  . Squamous cell carcinoma of skin 06/12/2020   in situ-right forehead  . Squamous cell carcinoma of skin 06/12/2020   well diff- mid froehead  . Stroke [facility]) jan 2016  . UTI (urinary tract infection)    secondary  . Visit for monitoring Tikosyn therapy     Past Surgical History:  Procedure Laterality Date  . CORONARY ANGIOPLASTY WITH STENT PLACEMENT  01/1997, stent x 1, 2009 2nd stent in same location   PTCA/stent to RCA  AND 2009 2ND STENT PLACED  . CORONARY ARTERY BYPASS GRAFT N/A 04/15/2014   Procedure: CORONARY ARTERY BYPASS GRAFTING TIMES TWO USEING LEFT INTERNAL MAMMARY ARTERY AND RIGHT GREATER SAPHENOUS VEIN VIA ENDOVEIN HARVEST;  Surgeon: Rexene Alberts, MD;  Location: [place];  Service: Open Heart Surgery;  Laterality: N/A;  . CYSTOSCOPY  11/17/15   [facility]  . GREEN LIGHT LASER TURP (TRANSURETHRAL RESECTION OF PROSTATE N/A 01/15/2016   Procedure: GREEN LIGHT LASER TURP (TRANSURETHRAL RESECTION OF PROSTATE;  Surgeon: Carolan Clines, MD;  Location: [facility];  Service: Urology;  Laterality: N/A;   LASER RIGHT LATERAL LOBE OF PROSTATE AND LEFT BLADDER NECK    . INTRAOPERATIVE TRANSESOPHAGEAL ECHOCARDIOGRAM N/A 04/15/2014   Procedure: INTRAOPERATIVE TRANSESOPHAGEAL ECHOCARDIOGRAM;  Surgeon: Rexene Alberts, MD;  Location: [place];  Service: Open Heart Surgery;  Laterality: N/A;  . JOINT REPLACEMENT     both knees  . KNEE SURGERY  05/2010   removing scar tissue R knee (Dr. Wynelle Link)  . LEFT AND RIGHT HEART CATHETERIZATION WITH CORONARY ANGIOGRAM  04/13/2014   Procedure: LEFT  AND RIGHT HEART CATHETERIZATION WITH CORONARY ANGIOGRAM;  Surgeon: Sinclair Grooms, MD;  Location: [facility] CATH LAB;  Service: Cardiovascular;;  . TONSILLECTOMY    . TOTAL KNEE ARTHROPLASTY  L 4/06, R 4/07  . TOTAL KNEE REVISION  09/23/2012   Procedure: TOTAL KNEE REVISION;  Surgeon: Gearlean Alf, MD; right side  Location: [facility];  Service: Orthopedics;  Laterality: Right;  . TRANSURETHRAL RESECTION OF PROSTATE  06/2010  . VASECTOMY      Social History   Socioeconomic History  . Marital status: Married    Spouse name: Not on file  . Number of children: 4  . Years of education: Not on file  . Highest education level: Not on file  Occupational History  . Occupation: Engineer, maintenance (IT)  Tobacco Use  . Smoking status: Former Smoker    Packs/day: 1.50    Years: 30.00    Pack years: 45.00    Types: Cigarettes    Quit date: 09/16/1986    Years since quitting: 34.3  . Smokeless tobacco: Never Used  . Tobacco comment: smoked 1-3 PPD from age 48 to 22  Vaping Use  . Vaping Use: Never used  Substance and Sexual Activity  . Alcohol use: Yes    Alcohol/week: 1.0 standard drink    Types: 1 Glasses of wine per week    Comment: 1 glass of wine 2-3 times a week  . Drug use: No  . Sexual activity: Not Currently  Other Topics Concern  . Not on file  Social History Narrative   Lives with wife.  4 kids, 1 stepchild (1 in [place], 4 in [place]). 11 grandchildren, 3 great grandchild   No pets. Wife is a diabetic.   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 Coronary artery disease Mother        deceased  . Dementia Mother   . Atrial fibrillation Mother   . Stroke Mother        related to atrial fib  . Coronary artery disease Father        deceased  . Hypertension Father   . Heart disease Father   . Hypertension Paternal Grandmother   . Heart disease Paternal Grandmother   . Hypertension Paternal Grandfather   . Heart disease Paternal Grandfather   . Hypothyroidism Daughter        congenital  . Cancer Paternal Uncle        colon; died in his 61's  . Colon cancer Paternal Uncle   . Diabetes Neg Hx     ROS: no fevers or chills, productive cough, hemoptysis, dysphasia, odynophagia, melena, hematochezia, dysuria, hematuria, rash, seizure activity, orthopnea, PND, pedal edema, claudication. Remaining systems are negative.  Physical Exam: Well-developed well-nourished in no acute distress.  Skin is warm and dry.  HEENT is normal.  Neck is supple.  Chest is clear to auscultation with normal expansion.  Cardiovascular exam is regular rate and rhythm.  Abdominal exam nontender or distended. No masses palpated. Extremities show no edema. neuro grossly intact  ECG- personally reviewed  A/P  1 dyspnea/chest pain-  2  coronary artery disease status post coronary artery bypass graft-continue statin.  He is not on aspirin given need for anticoagulation.  3 paroxysmal atrial fibrillation-he is in sinus rhythm.  Continue Tikosyn, beta-blocker and apixaban.  4 chronic diastolic congestive heart failure-  5 aortic stenosis-mild on most recent echocardiogram.  He  will need follow-up studies in the future.  6 hypertension-blood pressure controlled.  Continue present medical regimen.  7 hyperlipidemia-continue statin and Zetia.  8 carotid artery disease-mild on most recent Dopplers.  9 sleep apnea-continue CPAP.  Kirk Ruths, MD

## 2021-01-31 ENCOUNTER — Telehealth: Payer: Self-pay | Admitting: Family Medicine

## 2021-01-31 NOTE — Telephone Encounter (Signed)
Sympathy card sent 

## 2021-02-02 ENCOUNTER — Telehealth: Payer: Self-pay | Admitting: Family Medicine

## 2021-02-02 ENCOUNTER — Ambulatory Visit: Payer: Medicare HMO | Admitting: Cardiology

## 2021-02-02 NOTE — Telephone Encounter (Signed)
[employer] home called and said pts death certificate is in the data base and needs to be signed

## 2021-02-14 DEATH — deceased

## 2021-04-16 ENCOUNTER — Ambulatory Visit: Payer: Medicare HMO | Admitting: Family Medicine

## 2021-04-22 ENCOUNTER — Encounter: Payer: Self-pay | Admitting: Dermatology

## 2021-04-23 NOTE — Addendum Note (Signed)
Addended by: Lavonna Monarch on: 04/23/2021 09:44 PM   Modules accepted: Orders

## 2021-04-23 NOTE — Progress Notes (Signed)
Follow-Up Visit   Subjective  Jeffrey Wiggins is a 80 y.o. male who presents for the following: Procedure (Here for treatment- right parietal scalp- cis x 1 ).  Biopsy proven carcinoma in situ scalp plus new scalp lesions Location:  Duration:  Quality:  Associated Signs/Symptoms: Modifying Factors:  Severity:  Timing: Context:   Objective  Well appearing patient in no apparent distress; mood and affect are within normal limits. Mid Parietal Scalp (6) Many crusts on top scalp  Mid Parietal Scalp No major flat crust     Left Mid Parietal Scalp 1 cm pink crust     Left Frontal Scalp 10 mm pink heaped up crust, rule out superficial SCCA       A focused examination was performed including head and neck. Relevant physical exam findings are noted in the Assessment and Plan.   Assessment & Plan    AK (actinic keratosis) (6) Mid Parietal Scalp  LN2 to half dozen thickness lesions.  Destruction of lesion - Mid Parietal Scalp Complexity: simple   Destruction method: cryotherapy   Informed consent: discussed and consent obtained   Timeout:  patient name, date of birth, surgical site, and procedure verified Lesion destroyed using liquid nitrogen: Yes   Cryotherapy cycles:  5 Outcome: patient tolerated procedure well with no complications    Neoplasm of uncertain behavior of skin (2) Mid Parietal Scalp  Skin / nail biopsy Type of biopsy: tangential   Informed consent: discussed and consent obtained   Timeout: patient name, date of birth, surgical site, and procedure verified   Anesthesia: the lesion was anesthetized in a standard fashion   Anesthetic:  1% lidocaine w/ epinephrine 1-100,000 local infiltration Instrument used: flexible razor blade   Hemostasis achieved with: aluminum chloride and electrodesiccation   Outcome: patient tolerated procedure well   Post-procedure details: wound care instructions given    Destruction of lesion Complexity:  simple   Destruction method: electrodesiccation and curettage   Informed consent: discussed and consent obtained   Timeout:  patient name, date of birth, surgical site, and procedure verified Anesthesia: the lesion was anesthetized in a standard fashion   Anesthetic:  1% lidocaine w/ epinephrine 1-100,000 local infiltration Curettage performed in three different directions: Yes   Electrodesiccation performed over the curetted area: Yes   Curettage cycles:  3 Lesion length (cm):  1.6 Lesion width (cm):  1.6 Margin per side (cm):  0 Final wound size (cm):  1.6 Hemostasis achieved with:  aluminum chloride Outcome: patient tolerated procedure well with no complications   Post-procedure details: wound care instructions given    Specimen 2 - Surgical pathology Differential Diagnosis: R/O BCC vs SCC  Check Margins: No  Treated after biopsy  Left Mid Parietal Scalp  Skin / nail biopsy Type of biopsy: tangential   Informed consent: discussed and consent obtained   Timeout: patient name, date of birth, surgical site, and procedure verified   Procedure prep:  Patient was prepped and draped in usual sterile fashion (Non sterile) Prep type:  Chlorhexidine Anesthesia: the lesion was anesthetized in a standard fashion   Anesthetic:  1% lidocaine w/ epinephrine 1-100,000 local infiltration Instrument used: flexible razor blade   Outcome: patient tolerated procedure well   Post-procedure details: wound care instructions given    Destruction of lesion Complexity: simple   Destruction method: electrodesiccation and curettage   Informed consent: discussed and consent obtained   Timeout:  patient name, date of birth, surgical site, and procedure verified Anesthesia: the  lesion was anesthetized in a standard fashion   Anesthetic:  1% lidocaine w/ epinephrine 1-100,000 local infiltration Curettage performed in three different directions: Yes   Electrodesiccation performed over the curetted  area: Yes   Curettage cycles:  3 Lesion length (cm):  1.5 Lesion width (cm):  1.5 Margin per side (cm):  0 Final wound size (cm):  1.5 Hemostasis achieved with:  aluminum chloride Outcome: patient tolerated procedure well with no complications   Post-procedure details: wound care instructions given    Specimen 3 - Surgical pathology Differential Diagnosis: R/O BCC vs SCC  Check Margins: No  Treated after biopsy  After shave biopsy the base of the lesions was treated with curettage plus cautery.  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 Scalp  Skin / nail biopsy Type of biopsy: tangential   Informed consent: discussed and consent obtained   Timeout: patient name, date of birth, surgical site, and procedure verified   Procedure prep:  Patient was prepped and draped in usual sterile fashion (Non sterile) Prep type:  Chlorhexidine Anesthesia: the lesion was anesthetized in a standard fashion   Anesthetic:  1% lidocaine w/ epinephrine 1-100,000 local infiltration Instrument used: flexible razor blade   Outcome: patient tolerated procedure well   Post-procedure details: wound care instructions given    Destruction of lesion Complexity: simple   Destruction method: electrodesiccation and curettage   Informed consent: discussed and consent obtained   Timeout:  patient name, date of birth, surgical site, and procedure verified Anesthesia: the lesion was anesthetized in a standard fashion   Anesthetic:  1% lidocaine w/ epinephrine 1-100,000 local infiltration Curettage performed in three different directions: Yes   Electrodesiccation performed over the curetted area: Yes   Curettage cycles:  3 Lesion length (cm):  1.2 Lesion width (cm):  1.2 Margin per side (cm):  0 Final wound size (cm):  1.2 Hemostasis achieved with:  ferric subsulfate Outcome: patient tolerated procedure well with no complications   Post-procedure details: wound care instructions given    Specimen  1 - Surgical pathology Differential Diagnosis: bcc vs scc   Check Margins: No  Treated after biopsy  Shave biopsy the base of the lesion was treated with curettage plus cautery      I, Lavonna Monarch, MD, have reviewed all documentation for this visit.  The documentation on 04/23/21 for the exam, diagnosis, procedures, and orders are all accurate and complete.

## 2021-08-06 ENCOUNTER — Ambulatory Visit: Payer: Medicare HMO | Admitting: Dermatology

## 2022-04-06 IMAGING — CT CT ABD-PELV W/ CM
1 of 3 series · 12 of 32 positions shown, 18 images · IV contrast (APPLIED)
Comparison: 10/23/2008

CLINICAL DATA: Elevated LFTs.  Recent diagnosis of prostate cancer.

EXAM:
CT ABDOMEN AND PELVIS WITH CONTRAST
TECHNIQUE: Multidetector CT imaging of the abdomen and pelvis was performed
using the standard protocol following bolus administration of
intravenous contrast.
CONTRAST:  100mL RPASCE-MVV IOPAMIDOL (RPASCE-MVV) INJECTION 61%

[Series 2: abd/pelvis w/cm · axial · 0.95mm/px · z∈[-480,-95]mm · 12 of 91 slices shown, 18 images]
[im 7/91  soft-tissue]
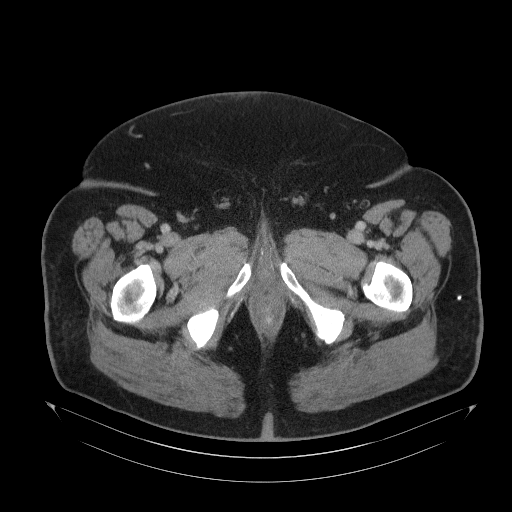
[im 7/91  bone]
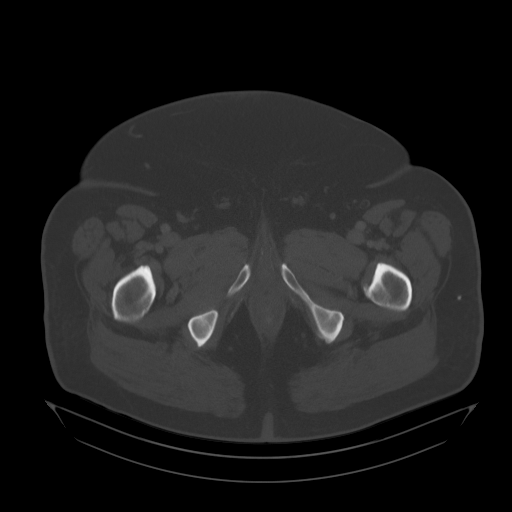
[im 13/91  soft-tissue]
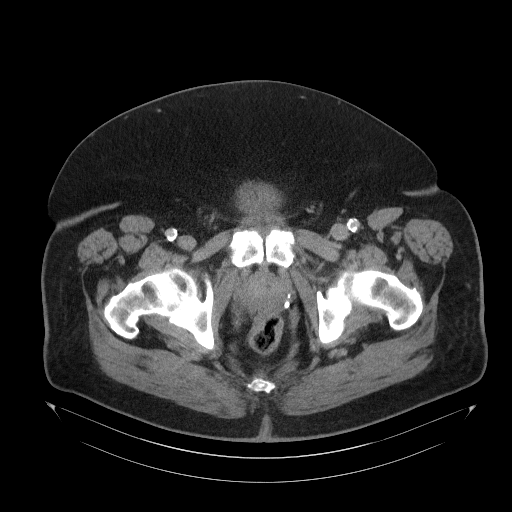
[im 20/91  soft-tissue]
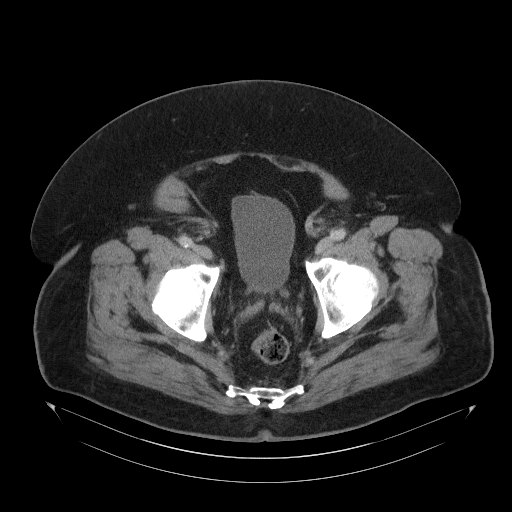
[im 26/91  soft-tissue]
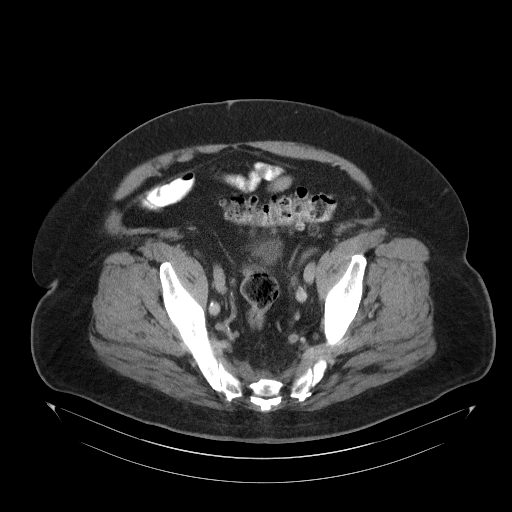
[im 33/91  soft-tissue]
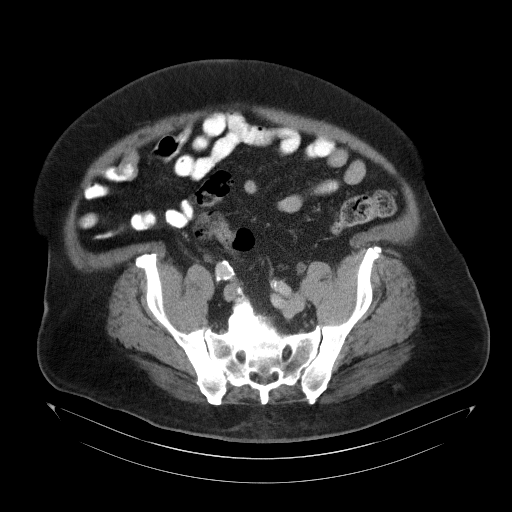
[im 39/91  soft-tissue]
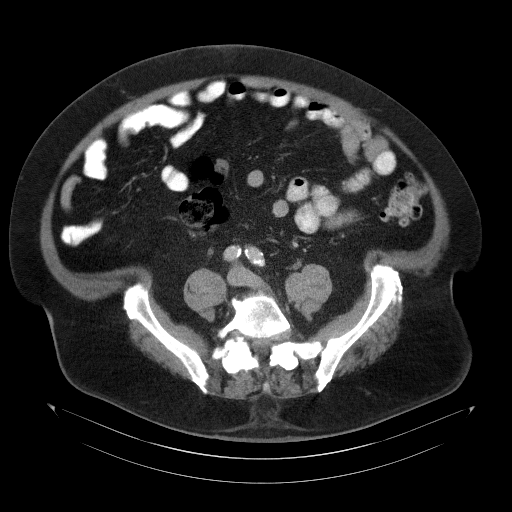
[im 52/91  soft-tissue]
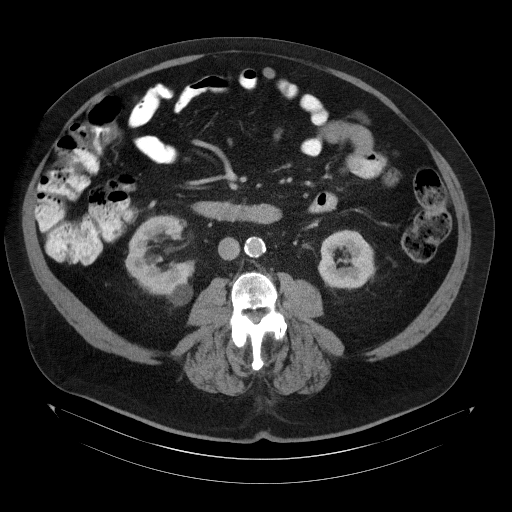
[im 58/91  soft-tissue]
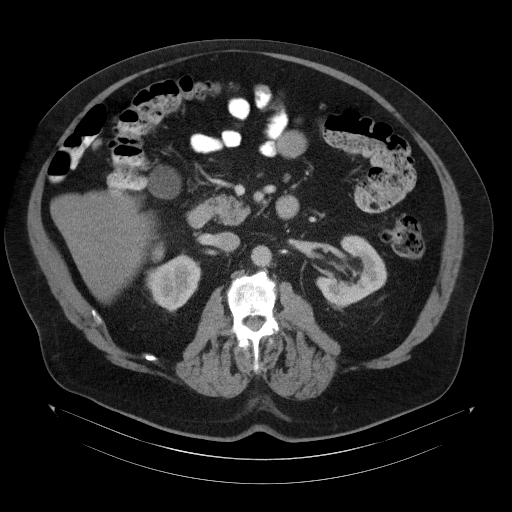
[im 65/91  soft-tissue]
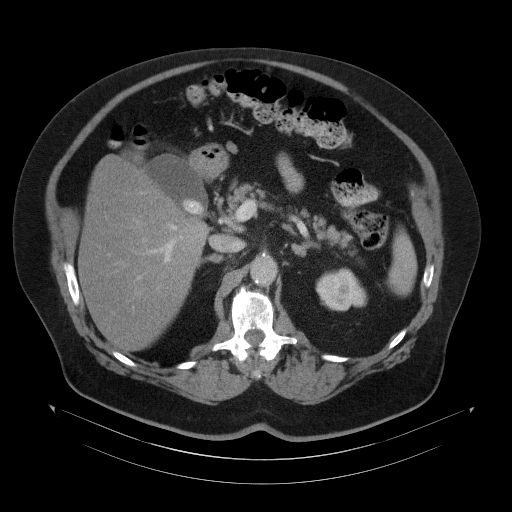
[im 65/91  lung]
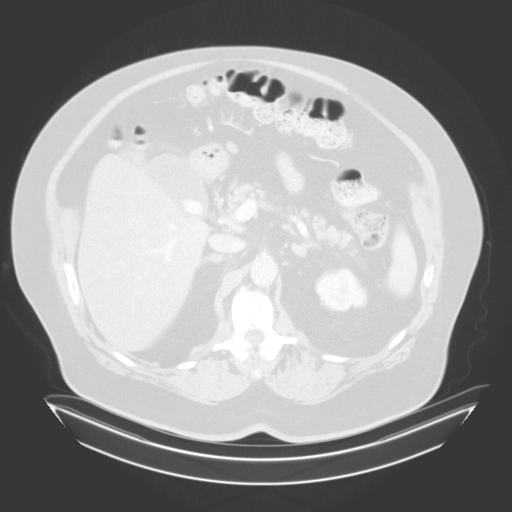
[im 65/91  bone]
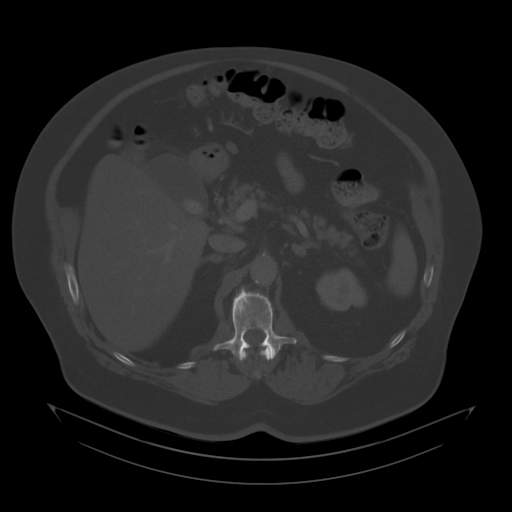
[im 71/91  soft-tissue]
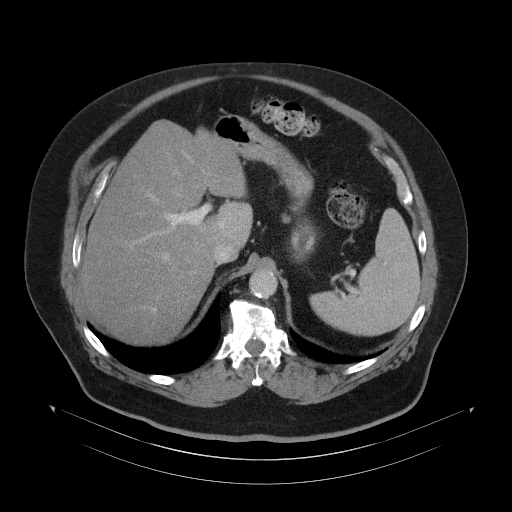
[im 71/91  lung]
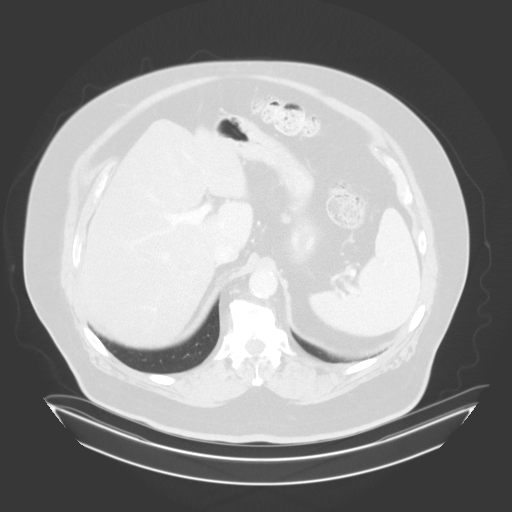
[im 78/91  soft-tissue]
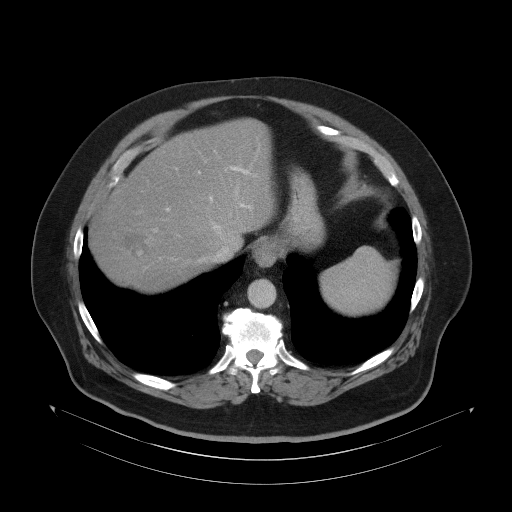
[im 78/91  lung]
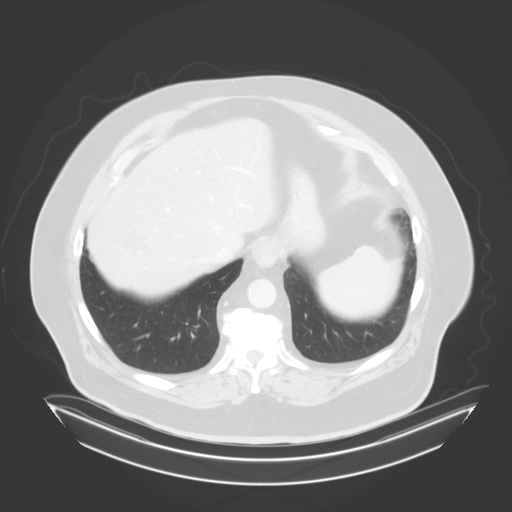
[im 84/91  soft-tissue]
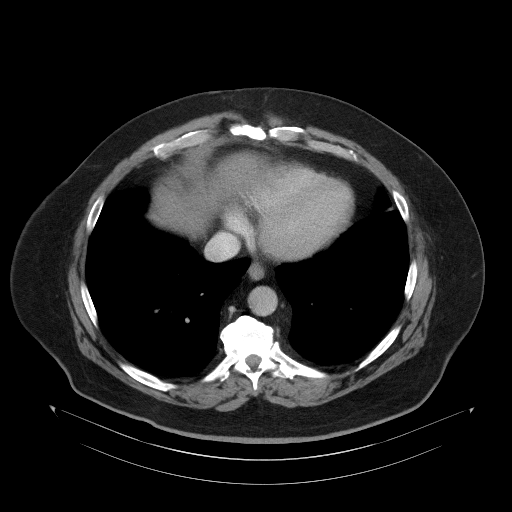
[im 84/91  lung]
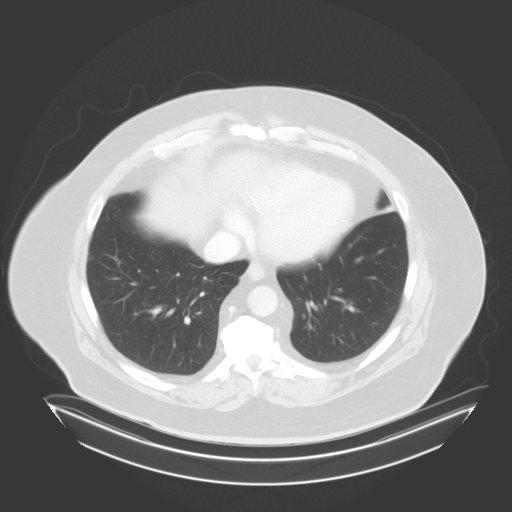

[12 of 32 positions shown; findings below may reference images not displayed]

FINDINGS: Lower chest: Lung bases are normal. Median sternotomy wires are
present. There is calcified plaque over the right coronary artery
and descending thoracic aorta.

Hepatobiliary: Moderate fatty infiltration of the liver. 2.5 cm
hypodensity with minimal peripheral nodular enhancement over the
right lobe of the liver likely a hemangioma. Cholelithiasis with
single 2 cm gallstone present. Biliary tree is normal.

Pancreas: Normal.

Spleen: Normal.

Adrenals/Urinary Tract: Adrenal glands are normal. Kidneys are
normal in size without hydronephrosis or nephrolithiasis. 2.7 cm
cyst over the posterior mid to lower pole right kidney.
Subcentimeter upper pole left renal cortical hypodensity too small
to characterize but likely a cyst. Ureters and bladder are normal.

Stomach/Bowel: Stomach and small bowel are normal. Moderate
diverticulosis of the descending and sigmoid colon without active
inflammation. Appendix is normal.

Vascular/Lymphatic: There is mild calcified plaque over the
abdominal aorta which is normal in caliber. No adenopathy.

Reproductive: Normal.

Other: No free fluid or focal inflammatory change.

Musculoskeletal: Degenerative change of the spine.
IMPRESSION: 1.  No acute findings in the abdomen/pelvis.

2. 2.5 cm hypodensity over the right lobe of the liver likely a
hemangioma. Consider ultrasound for further evaluation.

3.  Cholelithiasis as evidence by single 2 cm gallstone.

4. Aortic Atherosclerosis ([id number]). Atherosclerotic coronary
artery disease.

5.  Moderate colonic diverticulosis without active inflammation.

6. 2.7 cm right renal cyst. Subcentimeter left renal cortical
hypodensity likely a cyst, but too small to characterize.
